# Patient Record
Sex: Female | Born: 1955 | Race: White | Hispanic: No | Marital: Married | Smoking: Never smoker
Health system: Southern US, Community
[De-identification: ages and names within clinical notes are randomized; demographics above are authoritative.]

## PROBLEM LIST (undated history)

## (undated) DIAGNOSIS — F0390 Unspecified dementia without behavioral disturbance: Secondary | ICD-10-CM

## (undated) DIAGNOSIS — I472 Ventricular tachycardia: Secondary | ICD-10-CM

## (undated) DIAGNOSIS — S8290XA Unspecified fracture of unspecified lower leg, initial encounter for closed fracture: Secondary | ICD-10-CM

## (undated) DIAGNOSIS — M1990 Unspecified osteoarthritis, unspecified site: Secondary | ICD-10-CM

## (undated) DIAGNOSIS — C801 Malignant (primary) neoplasm, unspecified: Secondary | ICD-10-CM

## (undated) DIAGNOSIS — S069X9A Unspecified intracranial injury with loss of consciousness of unspecified duration, initial encounter: Secondary | ICD-10-CM

## (undated) DIAGNOSIS — G40909 Epilepsy, unspecified, not intractable, without status epilepticus: Secondary | ICD-10-CM

## (undated) DIAGNOSIS — S069XAA Unspecified intracranial injury with loss of consciousness status unknown, initial encounter: Secondary | ICD-10-CM

## (undated) DIAGNOSIS — R569 Unspecified convulsions: Secondary | ICD-10-CM

## (undated) HISTORY — PX: FACIAL FRACTURE SURGERY: SHX1570

## (undated) HISTORY — PX: BREAST SURGERY: SHX581

---

## 1999-05-02 ENCOUNTER — Encounter: Payer: Self-pay | Admitting: Family Medicine

## 1999-05-02 ENCOUNTER — Ambulatory Visit (HOSPITAL_COMMUNITY): Admission: RE | Admit: 1999-05-02 | Discharge: 1999-05-02 | Payer: Self-pay | Admitting: Family Medicine

## 1999-12-11 ENCOUNTER — Ambulatory Visit (HOSPITAL_COMMUNITY): Admission: RE | Admit: 1999-12-11 | Discharge: 1999-12-11 | Payer: Self-pay | Admitting: Family Medicine

## 1999-12-11 ENCOUNTER — Encounter: Payer: Self-pay | Admitting: Family Medicine

## 2000-08-04 ENCOUNTER — Encounter: Admission: RE | Admit: 2000-08-04 | Discharge: 2000-08-04 | Payer: Self-pay | Admitting: Family Medicine

## 2000-08-04 ENCOUNTER — Encounter: Payer: Self-pay | Admitting: Family Medicine

## 2001-08-08 ENCOUNTER — Encounter: Payer: Self-pay | Admitting: Family Medicine

## 2001-08-08 ENCOUNTER — Ambulatory Visit (HOSPITAL_COMMUNITY): Admission: RE | Admit: 2001-08-08 | Discharge: 2001-08-08 | Payer: Self-pay | Admitting: Family Medicine

## 2002-11-15 ENCOUNTER — Ambulatory Visit (HOSPITAL_COMMUNITY): Admission: RE | Admit: 2002-11-15 | Discharge: 2002-11-15 | Payer: Self-pay | Admitting: Family Medicine

## 2002-11-15 ENCOUNTER — Encounter: Payer: Self-pay | Admitting: Family Medicine

## 2003-07-05 ENCOUNTER — Other Ambulatory Visit: Admission: RE | Admit: 2003-07-05 | Discharge: 2003-07-05 | Payer: Self-pay | Admitting: Family Medicine

## 2003-07-10 ENCOUNTER — Encounter: Payer: Self-pay | Admitting: Family Medicine

## 2003-07-10 ENCOUNTER — Encounter: Admission: RE | Admit: 2003-07-10 | Discharge: 2003-07-10 | Payer: Self-pay | Admitting: Family Medicine

## 2003-07-13 ENCOUNTER — Encounter: Payer: Self-pay | Admitting: Family Medicine

## 2003-07-13 ENCOUNTER — Encounter: Admission: RE | Admit: 2003-07-13 | Discharge: 2003-07-13 | Payer: Self-pay | Admitting: Family Medicine

## 2003-09-04 ENCOUNTER — Encounter (INDEPENDENT_AMBULATORY_CARE_PROVIDER_SITE_OTHER): Payer: Self-pay | Admitting: *Deleted

## 2003-09-04 ENCOUNTER — Encounter: Admission: RE | Admit: 2003-09-04 | Discharge: 2003-09-04 | Payer: Self-pay | Admitting: Family Medicine

## 2003-09-04 ENCOUNTER — Other Ambulatory Visit: Admission: RE | Admit: 2003-09-04 | Discharge: 2003-09-04 | Payer: Self-pay | Admitting: Family Medicine

## 2003-09-04 ENCOUNTER — Encounter: Payer: Self-pay | Admitting: Family Medicine

## 2004-06-10 ENCOUNTER — Emergency Department (HOSPITAL_COMMUNITY): Admission: EM | Admit: 2004-06-10 | Discharge: 2004-06-10 | Payer: Self-pay | Admitting: Emergency Medicine

## 2005-01-19 ENCOUNTER — Other Ambulatory Visit: Admission: RE | Admit: 2005-01-19 | Discharge: 2005-01-19 | Payer: Self-pay | Admitting: Family Medicine

## 2005-07-08 ENCOUNTER — Encounter (INDEPENDENT_AMBULATORY_CARE_PROVIDER_SITE_OTHER): Payer: Self-pay | Admitting: *Deleted

## 2005-07-08 ENCOUNTER — Encounter: Admission: RE | Admit: 2005-07-08 | Discharge: 2005-07-08 | Payer: Self-pay | Admitting: General Surgery

## 2005-08-24 ENCOUNTER — Ambulatory Visit (HOSPITAL_BASED_OUTPATIENT_CLINIC_OR_DEPARTMENT_OTHER): Admission: RE | Admit: 2005-08-24 | Discharge: 2005-08-24 | Payer: Self-pay | Admitting: General Surgery

## 2005-08-24 ENCOUNTER — Ambulatory Visit (HOSPITAL_COMMUNITY): Admission: RE | Admit: 2005-08-24 | Discharge: 2005-08-24 | Payer: Self-pay | Admitting: General Surgery

## 2005-08-24 ENCOUNTER — Encounter (INDEPENDENT_AMBULATORY_CARE_PROVIDER_SITE_OTHER): Payer: Self-pay | Admitting: *Deleted

## 2006-01-14 ENCOUNTER — Other Ambulatory Visit: Admission: RE | Admit: 2006-01-14 | Discharge: 2006-01-14 | Payer: Self-pay | Admitting: Family Medicine

## 2007-03-22 ENCOUNTER — Other Ambulatory Visit: Admission: RE | Admit: 2007-03-22 | Discharge: 2007-03-22 | Payer: Self-pay | Admitting: Family Medicine

## 2007-07-25 ENCOUNTER — Encounter: Admission: RE | Admit: 2007-07-25 | Discharge: 2007-07-25 | Payer: Self-pay | Admitting: Surgery

## 2007-09-08 ENCOUNTER — Encounter (INDEPENDENT_AMBULATORY_CARE_PROVIDER_SITE_OTHER): Payer: Self-pay | Admitting: Surgery

## 2007-09-08 ENCOUNTER — Ambulatory Visit (HOSPITAL_BASED_OUTPATIENT_CLINIC_OR_DEPARTMENT_OTHER): Admission: RE | Admit: 2007-09-08 | Discharge: 2007-09-08 | Payer: Self-pay | Admitting: Surgery

## 2007-12-21 ENCOUNTER — Encounter: Admission: RE | Admit: 2007-12-21 | Discharge: 2007-12-28 | Payer: Self-pay | Admitting: Family Medicine

## 2007-12-30 ENCOUNTER — Encounter: Admission: RE | Admit: 2007-12-30 | Discharge: 2008-02-29 | Payer: Self-pay | Admitting: Family Medicine

## 2008-03-23 ENCOUNTER — Other Ambulatory Visit: Admission: RE | Admit: 2008-03-23 | Discharge: 2008-03-23 | Payer: Self-pay | Admitting: Family Medicine

## 2008-07-24 ENCOUNTER — Encounter: Admission: RE | Admit: 2008-07-24 | Discharge: 2008-09-07 | Payer: Self-pay | Admitting: Family Medicine

## 2008-08-07 ENCOUNTER — Ambulatory Visit: Payer: Self-pay | Admitting: Oncology

## 2009-07-17 ENCOUNTER — Other Ambulatory Visit: Admission: RE | Admit: 2009-07-17 | Discharge: 2009-07-17 | Payer: Self-pay | Admitting: Obstetrics and Gynecology

## 2009-08-21 ENCOUNTER — Emergency Department (HOSPITAL_COMMUNITY): Admission: EM | Admit: 2009-08-21 | Discharge: 2009-08-21 | Payer: Self-pay | Admitting: Emergency Medicine

## 2010-07-04 ENCOUNTER — Other Ambulatory Visit: Admission: RE | Admit: 2010-07-04 | Discharge: 2010-07-04 | Payer: Self-pay | Admitting: Obstetrics and Gynecology

## 2011-04-04 LAB — POCT I-STAT, CHEM 8
HCT: 39 % (ref 36.0–46.0)
Hemoglobin: 13.3 g/dL (ref 12.0–15.0)
Sodium: 140 mEq/L (ref 135–145)
TCO2: 26 mmol/L (ref 0–100)

## 2011-05-12 NOTE — Op Note (Signed)
[name], RUDDER                 ACCOUNT NO.:  [id number]   MEDICAL RECORD NO.:  [id number]          PATIENT TYPE:  AMB   LOCATION:  DSC                          FACILITY:  [facility]   PHYSICIAN:  Thomas A. Cornett, M.D.DATE OF BIRTH:  [birth year]   DATE OF PROCEDURE:  09/08/2007  DATE OF DISCHARGE:                               OPERATIVE REPORT   PREOPERATIVE DIAGNOSIS:  Right breast mass.   POSTOPERATIVE DIAGNOSIS:  Right breast mass.   PROCEDURE:  Open right breast excisional biopsy.   SURGEON:  Harriette Bouillon, M.D.   ANESTHESIA:  LMA with 0.25% Sensorcaine local.   ESTIMATED BLOOD LOSS:  20 mL.   SPECIMEN:  Large solid cystic mass to pathology.   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 Francina Ames in the past.  This  area has recurred near her right nipple and she wished to have it  excised.  It is a complex solid cystic mass but she does not wish to  have this biopsied  percutaneously and would like to have it removed as  it is getting larger and is quite concerning to her.  She presents today  for that.   DESCRIPTION OF PROCEDURE:  The patient was brought to the operating  room, placed supine.  After induction of LMA anesthesia, right breast  was prepped, draped in sterile fashion.  0.25% Sensorcaine was used.  This was infiltrated into the skin.  I used a previous incision that was  present at the junction of the areola and breast skin from previous  lumpectomy which was a fibroadenoma.  We used the same incision and  opened the area and excised a large 5 x 4 cm solid cystic mass which had  the appearance of a combination of a cyst as well as a fibroadenoma.  These were passed off the field.  She had very little breast tissue left  from previous biopsies, but we were able to close this with minimal  cosmetic defect using a 4-0 Monocryl.  Dermabond was used as our  dressing.  All final counts of  sponge, needle and instruments were found  to be correct this portion case.  Hemostasis was excellent prior to  closing.  She was awoke taken to recovery in satisfactory condition.      Thomas A. Cornett, M.D.  Electronically Signed    TAC/MEDQ  D:  09/08/2007  T:  09/09/2007  Job:  [id number]   cc:   Chales Salmon. Abigail Miyamoto, M.D.  Lum Keas, MD

## 2011-05-21 ENCOUNTER — Other Ambulatory Visit: Payer: Self-pay | Admitting: Gastroenterology

## 2011-05-21 ENCOUNTER — Other Ambulatory Visit: Payer: Self-pay | Admitting: Family Medicine

## 2011-05-21 DIAGNOSIS — Z1231 Encounter for screening mammogram for malignant neoplasm of breast: Secondary | ICD-10-CM

## 2011-05-22 ENCOUNTER — Ambulatory Visit
Admission: RE | Admit: 2011-05-22 | Discharge: 2011-05-22 | Disposition: A | Discharge status: Home / Self Care | Payer: BC Managed Care – PPO | Source: Ambulatory Visit | Attending: Gastroenterology | Admitting: Gastroenterology

## 2011-06-04 ENCOUNTER — Other Ambulatory Visit: Payer: Self-pay | Admitting: Gastroenterology

## 2011-06-05 ENCOUNTER — Other Ambulatory Visit: Payer: Self-pay | Admitting: Family Medicine

## 2011-07-08 ENCOUNTER — Other Ambulatory Visit (HOSPITAL_COMMUNITY)
Admission: RE | Admit: 2011-07-08 | Discharge: 2011-07-08 | Disposition: A | Discharge status: Home / Self Care | Payer: BC Managed Care – PPO | Source: Ambulatory Visit | Attending: Obstetrics and Gynecology | Admitting: Obstetrics and Gynecology

## 2011-07-08 ENCOUNTER — Other Ambulatory Visit: Payer: Self-pay | Admitting: Obstetrics and Gynecology

## 2011-07-08 DIAGNOSIS — Z01419 Encounter for gynecological examination (general) (routine) without abnormal findings: Secondary | ICD-10-CM | POA: Insufficient documentation

## 2011-07-27 ENCOUNTER — Other Ambulatory Visit (HOSPITAL_COMMUNITY): Payer: Self-pay | Admitting: Gastroenterology

## 2011-07-31 ENCOUNTER — Ambulatory Visit (HOSPITAL_COMMUNITY)
Admission: RE | Admit: 2011-07-31 | Discharge: 2011-07-31 | Disposition: A | Discharge status: Home / Self Care | Payer: BC Managed Care – PPO | Source: Ambulatory Visit | Attending: Gastroenterology | Admitting: Gastroenterology

## 2011-07-31 DIAGNOSIS — R1310 Dysphagia, unspecified: Secondary | ICD-10-CM | POA: Insufficient documentation

## 2011-10-09 LAB — CBC
HCT: 33.9 — ABNORMAL LOW
Hemoglobin: 11.6 — ABNORMAL LOW
MCHC: 34.2
MCV: 91
RBC: 3.72 — ABNORMAL LOW
RDW: 13.4

## 2011-10-09 LAB — DIFFERENTIAL
Basophils Absolute: 0
Basophils Relative: 0
Eosinophils Absolute: 0.1
Eosinophils Relative: 1
Monocytes Absolute: 0.4
Monocytes Relative: 9
Neutro Abs: 2.8

## 2011-10-09 LAB — BASIC METABOLIC PANEL
CO2: 30
Calcium: 9
Chloride: 106
GFR calc Af Amer: >60
Glucose, Bld: 94
Sodium: 140

## 2012-04-16 ENCOUNTER — Encounter (HOSPITAL_COMMUNITY): Payer: Self-pay | Admitting: Emergency Medicine

## 2012-04-16 ENCOUNTER — Emergency Department (HOSPITAL_COMMUNITY): Payer: BC Managed Care – PPO

## 2012-04-16 ENCOUNTER — Emergency Department (HOSPITAL_COMMUNITY)
Admission: EM | Admit: 2012-04-16 | Discharge: 2012-04-16 | Disposition: A | Discharge status: Home / Self Care | Payer: BC Managed Care – PPO | Attending: Emergency Medicine | Admitting: Emergency Medicine

## 2012-04-16 DIAGNOSIS — M129 Arthropathy, unspecified: Secondary | ICD-10-CM | POA: Insufficient documentation

## 2012-04-16 DIAGNOSIS — S0180XA Unspecified open wound of other part of head, initial encounter: Secondary | ICD-10-CM | POA: Insufficient documentation

## 2012-04-16 DIAGNOSIS — I493 Ventricular premature depolarization: Secondary | ICD-10-CM

## 2012-04-16 DIAGNOSIS — Z79899 Other long term (current) drug therapy: Secondary | ICD-10-CM | POA: Insufficient documentation

## 2012-04-16 DIAGNOSIS — I4949 Other premature depolarization: Secondary | ICD-10-CM | POA: Insufficient documentation

## 2012-04-16 DIAGNOSIS — Z7982 Long term (current) use of aspirin: Secondary | ICD-10-CM | POA: Insufficient documentation

## 2012-04-16 DIAGNOSIS — G40909 Epilepsy, unspecified, not intractable, without status epilepticus: Secondary | ICD-10-CM

## 2012-04-16 HISTORY — DX: Unspecified osteoarthritis, unspecified site: M19.90

## 2012-04-16 HISTORY — DX: Unspecified convulsions: R56.9

## 2012-04-16 HISTORY — DX: Malignant (primary) neoplasm, unspecified: C80.1

## 2012-04-16 HISTORY — DX: Unspecified intracranial injury with loss of consciousness of unspecified duration, initial encounter: S06.9X9A

## 2012-04-16 HISTORY — DX: Epilepsy, unspecified, not intractable, without status epilepticus: G40.909

## 2012-04-16 HISTORY — DX: Unspecified intracranial injury with loss of consciousness status unknown, initial encounter: S06.9XAA

## 2012-04-16 LAB — BASIC METABOLIC PANEL
CO2: 30 mEq/L (ref 19–32)
Chloride: 103 mEq/L (ref 96–112)
Sodium: 139 mEq/L (ref 135–145)

## 2012-04-16 LAB — TROPONIN I: Troponin I: <0.3 ng/mL (ref <0.30)

## 2012-04-16 LAB — VALPROIC ACID LEVEL: Valproic Acid Lvl: 57 ug/mL (ref 50.0–100.0)
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
## 2012-04-16 NOTE — Discharge Instructions (Signed)
As we discussed, your lab level of your seizure medication was in a good range. You cannot drive for 6 months after having a seizure. Your electrolytes were normal. You were found to have multiple premature heartbeats today, please discuss this with your regular doctor.     Epilepsy A seizure (convulsion) is a sudden change in brain function that causes a change in behavior, muscle activity, or ability to remain awake and alert. If a person has recurring seizures, this is called epilepsy. CAUSES  Epilepsy is a disorder with many possible causes. Anything that disturbs the normal pattern of brain cell activity can lead to seizures. Seizure can be caused from illness to brain damage to abnormal brain development. Epilepsy may develop because of:  An abnormality in brain wiring.   An imbalance of nerve signaling chemicals (neurotransmitters).   Some combination of these factors.  Scientists are learning an increasing amount about genetic causes of seizures. SYMPTOMS  The symptoms of a seizure can vary greatly from one person to another. These may include:  An aura, or warning that tells a person they are about to have a seizure.   Abnormal sensations, such as abnormal smell or seeing flashing lights.   Sudden, general body stiffness.   Rhythmic jerking of the face, arm, or leg - on one or both sides.   Sudden change in consciousness.   The person may appear to be awake but not responding.   They may appear to be asleep but cannot be awakened.   Grimacing, chewing, lip smacking, or drooling.   Often there is a period of sleepiness after a seizure.  DIAGNOSIS  The description you give to your caregiver about what you experienced will help them understand your problems. Equally important is the description by any witnesses to your seizure. A physical exam, including a detailed neurological exam, is necessary. An EEG (electroencephalogram) is a painless test of your brain waves. In this  test a diagram is created of your brain waves. These diagrams can be interpreted by a specialist. Pictures of your brain are usually taken with:  An MRI.   A CT scan.  Lab tests may be done to look for:  Signs of infection.   Abnormal blood chemistry.  PREVENTION  There is no way to prevent the development of epilepsy. If you have seizures that are typically triggered by an event (such as flashing lights), try to avoid the trigger. This can help you avoid a seizure.  PROGNOSIS  Most people with epilepsy lead outwardly normal lives. While epilepsy cannot currently be cured, for some people it does eventually go away. Most seizures do not cause brain damage. It is not uncommon for people with epilepsy, especially children, to develop behavioral and emotional problems. These problems are sometimes the consequence of medicine for seizures or social stress. For some people with epilepsy, the risk of seizures restricts their independence and recreational activities. For example, some states refuse drivers licenses to people with epilepsy. Most women with epilepsy can become pregnant. They should discuss their epilepsy and the medicine they are taking with their caregivers. Women with epilepsy have a 90 percent or better chance of having a normal, healthy baby. RISKS AND COMPLICATIONS  People with epilepsy are at increased risk of falls, accidents, and injuries. People with epilepsy are at special risk for two life-threatening conditions. These are status epilepticus and sudden unexplained death (extremely rare). Status epilepticus is a long lasting, continuous seizure that is a medical emergency. TREATMENT  Once epilepsy is diagnosed, it is important to begin treatment as soon as possible. For about 80 percent of those diagnosed with epilepsy, seizures can be controlled with modern medicines and surgical techniques. Some antiepileptic drugs can interfere with the effectiveness of oral contraceptives. In  1997, the FDA approved a pacemaker for the brain the (vagus nerve stimulator). This stimulator can be used for people with seizures that are not well-controlled by medicine. Studies have shown that in some cases, children may experience fewer seizures if they maintain a strict diet. The strict diet is called the ketogenic diet. This diet is rich in fats and low in carbohydrates. HOME CARE INSTRUCTIONS   Your caregiver will make recommendations about driving and safety in normal activities. Follow these carefully.   Take any medicine prescribed exactly as directed.   Do any blood tests requested to monitor the levels of your medicine.   The people you live and work with should know that you are prone to seizures. They should receive instructions on how to help you. In general, a witness to a seizure should:   Cushion your head and body.   Turn you on your side.   Avoid unnecessarily restraining you.   Not place anything inside your mouth.   Call for local emergency medical help if there is any question about what has occurred.   Keep a seizure diary. Record what you recall about any seizure, especially any possible trigger.   If your caregiver has given you a follow-up appointment, it is very important to keep that appointment. Not keeping the appointment could result in permanent injury and disability. If there is any problem keeping the appointment, you must call back to this facility for assistance.  SEEK MEDICAL CARE IF:   You develop signs of infection or other illness. This might increase the risk of a seizure.   You seem to be having more frequent seizures.   Your seizure pattern is changing.  SEEK IMMEDIATE MEDICAL CARE IF:   A seizure does not stop after a few moments.   A seizure causes any difficulty in breathing.   A seizure results in a very severe headache.   A seizure leaves you with the inability to speak or use a part of your body.  MAKE SURE YOU:    Understand these instructions.   Will watch your condition.   Will get help right away if you are not doing well or get worse.  Document Released: 12/14/2005 Document Revised: 12/03/2011 Document Reviewed: 07/20/2008 [facility] Patient Information 2012 [place], [place].         Driving and Equipment Restrictions Some medical problems make it dangerous to drive, ride a bike, or use machines. Some of these problems are:  A hard blow to the head (concussion).   Passing out (fainting).   Twitching and shaking (seizures).   Low blood sugar.   Taking medicine to help you relax (sedatives).   Taking pain medicines.   Wearing an eye patch.   Wearing splints. This can make it hard to use parts of your body that you need to drive safely.  HOME CARE   Do not drive until your doctor says it is okay.   Do not use machines until your doctor says it is okay.  You may need a form signed by your doctor (medical release) before you can drive again. You may also need this form before you do other tasks where you need to be fully alert. MAKE SURE YOU:  Understand these instructions.   Will watch your condition.   Will get help right away if you are not doing well or get worse.  Document Released: 01/21/2005 Document Revised: 12/03/2011 Document Reviewed: 04/23/2010 [facility] Patient Information 2012 [place], [place].         Premature Ventricular Contraction Premature ventricular contraction (PVC) is an irregularity of the heart rhythm involving extra or skipped heartbeats. In some cases, they may occur without obvious cause or heart disease. Other times, they can be caused by an electrolyte change in the blood. These need to be corrected. They can also be seen when there is not enough oxygen going to the heart. A common cause of this is plaque or cholesterol buildup. This buildup decreases the blood supply to the heart. In addition, extra beats may be caused or aggravated  by:  Excessive smoking.   Alcohol consumption.   Caffeine.   Certain medications   Some street drugs.  SYMPTOMS   The sensation of feeling your heart skipping a beat (palpitations).   In many cases, the person may have no symptoms.  SIGNS AND TESTS   A physical examination may show an occasional irregularity, but if the PVC beats do not happen often, they may not be found on physical exam.   Blood pressure is usually normal.   Other tests that may find extra beats of the heart are:   An EKG (electrocardiogram)   A Holter monitor which can monitor your heart over longer periods of time   An Angiogram (study of the heart arteries).  TREATMENT  Usually extra heartbeats do not need treatment. The condition is treated only if symptoms are severe or if extra beats are very frequent or are causing problems. An underlying cause, if discovered, may also require treatment.  Treatment may also be needed if there may be a risk for other more serious cardiac arrhythmias.  PREVENTION   Moderation in caffeine, alcohol, and tobacco use may reduce the risk of ectopic heartbeats in some people.   Exercise often helps people who lead a sedentary (inactive) lifestyle.  PROGNOSIS  PVC heartbeats are generally harmless and do not need treatment.  RISKS AND COMPLICATIONS   Ventricular tachycardia (occasionally).   There usually are no complications.   Other arrhythmias (occasionally).  SEEK IMMEDIATE MEDICAL CARE IF:   You feel palpitations that are frequent or continual.   You develop chest pain or other problems such as shortness of breath, sweating, or nausea and vomiting.   You become light-headed or faint (pass out).   You get worse or do not improve with treatment.  Document Released: 07/31/2004 Document Revised: 12/03/2011 Document Reviewed: 02/10/2008 [facility] Patient Information 2012 [place], [place].
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
## 2012-04-16 NOTE — ED Notes (Signed)
Per Pt: recent changes to medication regimen have led to increased "symptoms" during day time and "seizures at night". Pt was aware of driving restrictions but felt safe driving during day. Education provided to patient and husband.

## 2012-04-16 NOTE — ED Notes (Signed)
Pt has seizure precautions in place at this time. She is on the cardiac monitor. Vital signs are WDL. Pt has two small lacerations to the forehead. Pt denies any pain at this time.
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
## 2012-04-18 NOTE — ED Provider Notes (Signed)
Medical screening examination/treatment/procedure(s) were performed by non-physician practitioner and as supervising physician I was immediately available for consultation/collaboration.   Laray Anger, DO 04/18/12 [phone]

## 2012-07-12 ENCOUNTER — Other Ambulatory Visit (HOSPITAL_COMMUNITY)
Admission: RE | Admit: 2012-07-12 | Discharge: 2012-07-12 | Disposition: A | Discharge status: Home / Self Care | Payer: BC Managed Care – PPO | Source: Ambulatory Visit | Attending: Obstetrics and Gynecology | Admitting: Obstetrics and Gynecology

## 2012-07-12 ENCOUNTER — Other Ambulatory Visit: Payer: Self-pay | Admitting: Obstetrics and Gynecology

## 2012-07-12 DIAGNOSIS — Z1151 Encounter for screening for human papillomavirus (HPV): Secondary | ICD-10-CM | POA: Insufficient documentation

## 2012-07-12 DIAGNOSIS — Z01419 Encounter for gynecological examination (general) (routine) without abnormal findings: Secondary | ICD-10-CM | POA: Insufficient documentation

## 2013-06-16 ENCOUNTER — Other Ambulatory Visit: Payer: Self-pay

## 2014-08-26 ENCOUNTER — Encounter (HOSPITAL_COMMUNITY): Payer: Self-pay | Admitting: Emergency Medicine

## 2014-08-26 ENCOUNTER — Emergency Department (HOSPITAL_COMMUNITY)
Admission: EM | Admit: 2014-08-26 | Discharge: 2014-08-26 | Disposition: A | Discharge status: Home / Self Care | Payer: BC Managed Care – PPO | Attending: Emergency Medicine | Admitting: Emergency Medicine

## 2014-08-26 ENCOUNTER — Emergency Department (HOSPITAL_COMMUNITY): Payer: BC Managed Care – PPO

## 2014-08-26 DIAGNOSIS — G5630 Lesion of radial nerve, unspecified upper limb: Secondary | ICD-10-CM | POA: Insufficient documentation

## 2014-08-26 DIAGNOSIS — M129 Arthropathy, unspecified: Secondary | ICD-10-CM | POA: Insufficient documentation

## 2014-08-26 DIAGNOSIS — Z79899 Other long term (current) drug therapy: Secondary | ICD-10-CM | POA: Insufficient documentation

## 2014-08-26 DIAGNOSIS — G5632 Lesion of radial nerve, left upper limb: Secondary | ICD-10-CM

## 2014-08-26 DIAGNOSIS — Z7982 Long term (current) use of aspirin: Secondary | ICD-10-CM | POA: Insufficient documentation

## 2014-08-26 DIAGNOSIS — R5381 Other malaise: Secondary | ICD-10-CM | POA: Diagnosis present

## 2014-08-26 DIAGNOSIS — G40909 Epilepsy, unspecified, not intractable, without status epilepticus: Secondary | ICD-10-CM | POA: Insufficient documentation

## 2014-08-26 DIAGNOSIS — R5383 Other fatigue: Secondary | ICD-10-CM | POA: Diagnosis present

## 2014-08-26 DIAGNOSIS — Z859 Personal history of malignant neoplasm, unspecified: Secondary | ICD-10-CM | POA: Insufficient documentation

## 2014-08-26 DIAGNOSIS — Z87820 Personal history of traumatic brain injury: Secondary | ICD-10-CM | POA: Diagnosis not present

## 2014-08-26 LAB — DIFFERENTIAL
BASOS PCT: 0 % (ref 0–1)
Basophils Absolute: 0 10*3/uL (ref 0.0–0.1)
EOS ABS: 0.1 10*3/uL (ref 0.0–0.7)
EOS PCT: 2 % (ref 0–5)
LYMPHS ABS: 1.6 10*3/uL (ref 0.7–4.0)
Lymphocytes Relative: 35 % (ref 12–46)
MONOS PCT: 10 % (ref 3–12)
Monocytes Absolute: 0.4 10*3/uL (ref 0.1–1.0)
Neutro Abs: 2.4 10*3/uL (ref 1.7–7.7)
Neutrophils Relative %: 53 % (ref 43–77)

## 2014-08-26 LAB — COMPREHENSIVE METABOLIC PANEL
ALBUMIN: 3.7 g/dL (ref 3.5–5.2)
ALT: 14 U/L (ref 0–35)
ANION GAP: 11 (ref 5–15)
AST: 23 U/L (ref 0–37)
Alkaline Phosphatase: 63 U/L (ref 39–117)
BUN: 17 mg/dL (ref 6–23)
CALCIUM: 9.1 mg/dL (ref 8.4–10.5)
CO2: 26 mEq/L (ref 19–32)
Chloride: 93 mEq/L — ABNORMAL LOW (ref 96–112)
Creatinine, Ser: 0.78 mg/dL (ref 0.50–1.10)
GFR calc non Af Amer: >90 mL/min (ref >90)
GLUCOSE: 80 mg/dL (ref 70–99)
Potassium: 4.5 mEq/L (ref 3.7–5.3)
SODIUM: 130 meq/L — AB (ref 137–147)
TOTAL PROTEIN: 6.5 g/dL (ref 6.0–8.3)
Total Bilirubin: <0.2 mg/dL — ABNORMAL LOW (ref 0.3–1.2)

## 2014-08-26 LAB — URINALYSIS, ROUTINE W REFLEX MICROSCOPIC
BILIRUBIN URINE: NEGATIVE
Glucose, UA: NEGATIVE mg/dL
HGB URINE DIPSTICK: NEGATIVE
Ketones, ur: NEGATIVE mg/dL
NITRITE: NEGATIVE
PROTEIN: NEGATIVE mg/dL
SPECIFIC GRAVITY, URINE: 1.013 (ref 1.005–1.030)
UROBILINOGEN UA: 0.2 mg/dL (ref 0.0–1.0)
pH: 5.5 (ref 5.0–8.0)

## 2014-08-26 LAB — URINE MICROSCOPIC-ADD ON

## 2014-08-26 LAB — CBC
HCT: 34.9 % — ABNORMAL LOW (ref 36.0–46.0)
Hemoglobin: 12.1 g/dL (ref 12.0–15.0)
MCH: 31.4 pg (ref 26.0–34.0)
MCHC: 34.7 g/dL (ref 30.0–36.0)
MCV: 90.6 fL (ref 78.0–100.0)
PLATELETS: 135 10*3/uL — AB (ref 150–400)
RBC: 3.85 MIL/uL — AB (ref 3.87–5.11)
RDW: 13 % (ref 11.5–15.5)
WBC: 4.5 10*3/uL (ref 4.0–10.5)

## 2014-08-26 LAB — VALPROIC ACID LEVEL: Valproic Acid Lvl: 65.2 ug/mL (ref 50.0–100.0)

## 2014-08-26 NOTE — ED Notes (Signed)
Patient transported to MRI 

## 2014-08-26 NOTE — ED Notes (Signed)
Pt returned from MRI °

## 2014-08-26 NOTE — ED Notes (Signed)
Pt reports that on Friday she had decreased range of motion of her fingers in the left hand that began on Friday. Pt has full mobility of the shoulder. Pt reports pain in the left elbow as well. Pt denies any recent falls or injury, however reports cleaning her home yesterday.

## 2014-08-26 NOTE — ED Notes (Signed)
MD at bedside. 

## 2014-08-26 NOTE — ED Provider Notes (Addendum)
Medical screening examination/treatment/procedure(s) were conducted as a shared visit with non-physician practitioner(s) and myself.  I personally evaluated the patient during the encounter.   EKG Interpretation None     Patient here complaining of a two-day history of decreased ability to flex her left hand. No other reported weakness or severe headaches. Does have a history of seizures and has had gait issues in the past. On exam patient has normal sensation in all 5 digits. Has decreased strength with extension of the left wrist. Radial pulses 2+ on the left. Median nerve appears to be intact. Ulnar nerve is intact as well 2. grip strength is decreased. Gait is normal without ataxia. Some dysmetria on the left side. No facial asymmetry. Cranial nerves intact 2 through 12. No speech abnormalities. We'll obtain MRI to rule out stroke. Patient likely with peripheral nerve issue  6:44 PM Patient's brain MRI negative. 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  Leota Jacobsen, MD 08/26/14 1545  Leota Jacobsen, MD 08/26/14 [phone]

## 2014-08-26 NOTE — Discharge Instructions (Signed)
Call the neurologist tomorrow to schedule a followup visit Radial Nerve Palsy Wrist drop is also known as radial nerve palsy. It is a condition in which you can not extend your wrist. This means if you are standing with your elbow bent at a right angle and with the top of your hand pointed at the ceiling, you can not hold your hand up. It falls toward the floor.  This action of extending your wrist is caused by the muscles in the back of your arm. These muscles are controlled by the radial nerve. This means that anything affecting the radial nerve so it can not tell the muscles how to work will cause wrist drop. This is medically called radial nerve palsy. Also the radial nerve is a motor and sensory nerve so anything affecting it causes problems with movement and feeling. CAUSES  Some more common causes of wrist drop are:  A break (fracture) of the large bone in the arm between your shoulder and your elbow (humerus). This is because the radial nerve winds around the humerus.  Improper use of crutches causes this because the radial nerve runs through the armpit (axilla). Crutches which are too long can put pressure on the nerve. This is sometimes called crutch palsy.  Falling asleep with your arm over a chair and supported on the back is a common cause. This is sometimes called Saturday Night Syndrome.  Wrist drop can be associated with lead poisoning because of the effect of lead on the radial nerve. SYMPTOMS  The wrist drop is an obvious problem, but there may also be numbness in the back of the arm, forearm or hand which provides feeling in these areas by the radial nerve. There can be difficulty straightening out the elbow in addition to the wrist. There may be numbness, tingling, pain, burning sensations or other abnormal feelings. Symptoms depend entirely on where the radial nerve is injured. DIAGNOSIS   Wrist drop is obvious just by looking at it. Your caregiver may make the diagnosis by  taking your history and doing a couple tests.  One test which may be done is a nerve conduction study. This test shows if the radial nerve is conducting signals well. If not, it can determine where the nerve problem is.  Sometimes X-ray studies are done. Your caregiver will determine if further testing needs to be done. TREATMENT   Usually if the problem is found to be pressure on the nerve, simply removing the pressure will allow the nerve to go back to normal in a few weeks to a few months. Other treatments will depend upon the cause found.  Only take over-the-counter or prescription medicines for pain, discomfort, or fever as directed by your caregiver.  Sometimes seizure medications are used.  Steroids are sometimes given to decrease swelling if it is thought to be a possible cause. Document Released: 08/20/2006 Document Revised: 03/07/2012 Document Reviewed: 02/28/2014 [facility] Patient Information 2015 [place], [place]. This information is not intended to replace advice given to you by your health care provider. Make sure you discuss any questions you have with your health care provider.

## 2014-08-26 NOTE — ED Provider Notes (Signed)
CSN: [id number]     Arrival date & time 08/26/14  1419 History  This chart was scribed for non-physician practitioner, Margarita Mail, PA-C,working with Orlie Dakin, MD, by Marlowe Kays, ED Scribe. This patient was seen in room WTR6/WTR6 and the patient's care was started at 2:50 PM.  Chief Complaint  Patient presents with  . Hand Problem   The history is provided by the patient. No language interpreter was used.   HPI Comments:  [name] is a 58 y.o. female with PMH of seizure disorder, brian injury and cancer who presents to the Emergency Department complaining of moderate decreased ROM of the left hand that began two days ago. She reports "falling asleep" at work and upon waking she states her left hand was paralyzed and numb. Pt states she does not remember falling asleep and does not remember what happened. She states she kept massaging the hand with minimal relief. Upon waking the next day she states the hand was extremely weak, but she could feel it and had some ability to move it. Pt reports some increased confusion as of late.   Past Medical History  Diagnosis Date  . Seizure disorder   . Brain injury   . Arthritis lower back/hands  . Seizures   . Cancer breast tumor removed   Past Surgical History  Procedure Laterality Date  . Breast surgery    . Facial fracture surgery  orbital bone repair   No family history on file. History  Substance Use Topics  . Smoking status: Never Smoker   . Smokeless tobacco: Never Used  . Alcohol Use: No   OB History   Grav Para Term Preterm Abortions TAB SAB Ect Mult Living                 Review of Systems  Allergies  Review of patient's allergies indicates no known allergies.  Home Medications   Prior to Admission medications   Medication Sig Start Date End Date Taking? Authorizing Provider  aspirin EC 325 MG tablet Take 325 mg by mouth every morning.   Yes Historical Provider, MD  cloBAZam (ONFI) 10 MG tablet Take 10  mg by mouth 2 (two) times daily.   Yes Historical Provider, MD  divalproex (DEPAKOTE) 500 MG DR tablet Take 1,000 mg by mouth at bedtime.    Yes Historical Provider, MD  estradiol (ESTRACE) 0.1 MG/GM vaginal cream Place 1 g vaginally as needed.    Yes Historical Provider, MD  Multiple Vitamin (MULITIVITAMIN WITH MINERALS) TABS Take 1 tablet by mouth every morning.    Yes Historical Provider, MD  naproxen sodium (ANAPROX) 220 MG tablet Take 220 mg by mouth every morning.   Yes Historical Provider, MD  omeprazole (PRILOSEC) 20 MG capsule Take 20 mg by mouth 2 (two) times daily.   Yes Historical Provider, MD   Triage Vitals: BP 118/65  Pulse 69  Temp(Src) 98.8 F (37.1 C) (Oral)  Resp 20  SpO2 100% Physical Exam  Nursing note and vitals reviewed. Constitutional: She is oriented to person, place, and time. She appears well-developed and well-nourished.  HENT:  Head: Normocephalic and atraumatic.  Eyes: EOM are normal.  Neck: Normal range of motion.  Cardiovascular: Normal rate.   Radial pulses equal bilaterally.  Pulmonary/Chest: Effort normal.  Musculoskeletal: Normal range of motion.  Weakness in left hand. Decreased tone in musculature of left hand.  Neurological: She is alert and oriented to person, place, and time.  Sensations intact bilaterally.  Skin:  Skin is warm and dry.  Psychiatric: She has a normal mood and affect. Her behavior is normal.    ED Course  Procedures (including critical care time) DIAGNOSTIC STUDIES: Oxygen Saturation is 100% on RA, normal by my interpretation.   COORDINATION OF CARE: 3:04 PM- Will move pt to back for further evaluation. Pt verbalizes understanding and agrees to plan.  Medications - No data to display  Labs Review Labs Reviewed - No data to display  Imaging Review No results found.   EKG Interpretation None      MDM   Final diagnoses:  None   This incident happened greater than 48 hours ago and pt is now outside of the TPA  window.  Patient with clear neurologic deficit, left hand weakness, decreased tone and strength, she ha normal reflex. History of irregular rhythm, brain injury and seizure. She has ataxia with tandem walking.  I personally performed the services described in this documentation, which was scribed in my presence. The recorded information has been reviewed and is accurate.  Margarita Mail, PA-C 08/30/14 [phone]

## 2014-08-30 NOTE — ED Provider Notes (Signed)
Medical screening examination/treatment/procedure(s) were performed by non-physician practitioner and as supervising physician I was immediately available for consultation/collaboration.   EKG Interpretation   Date/Time:  Sunday August 26 2014 15:15:31 EDT Ventricular Rate:  65 PR Interval:  176 QRS Duration: 105 QT Interval:  409 QTC Calculation: 425 R Axis:   64 Text Interpretation:  Sinus rhythm Ventricular premature complex Minimal  ST depression, anterolateral leads No significant change since last  tracing Confirmed by Zenia Resides  MD, Saanvi Hakala ([id number]) on 08/26/2014 3:42:56 PM       Leota Jacobsen, MD 08/30/14 1535

## 2015-06-19 ENCOUNTER — Other Ambulatory Visit: Payer: Self-pay | Admitting: Obstetrics and Gynecology

## 2015-06-19 ENCOUNTER — Other Ambulatory Visit (HOSPITAL_COMMUNITY)
Admission: RE | Admit: 2015-06-19 | Discharge: 2015-06-19 | Disposition: A | Discharge status: Home / Self Care | Payer: BLUE CROSS/BLUE SHIELD | Source: Ambulatory Visit | Attending: Obstetrics and Gynecology | Admitting: Obstetrics and Gynecology

## 2015-06-19 DIAGNOSIS — Z01419 Encounter for gynecological examination (general) (routine) without abnormal findings: Secondary | ICD-10-CM | POA: Diagnosis present

## 2015-06-19 DIAGNOSIS — Z1151 Encounter for screening for human papillomavirus (HPV): Secondary | ICD-10-CM | POA: Insufficient documentation

## 2015-06-21 ENCOUNTER — Encounter: Payer: Self-pay | Admitting: Genetic Counselor

## 2015-06-21 LAB — CYTOLOGY - PAP

## 2016-02-10 DIAGNOSIS — G40209 Localization-related (focal) (partial) symptomatic epilepsy and epileptic syndromes with complex partial seizures, not intractable, without status epilepticus: Secondary | ICD-10-CM | POA: Insufficient documentation

## 2016-06-12 ENCOUNTER — Encounter: Payer: Self-pay | Admitting: Genetic Counselor

## 2017-03-28 ENCOUNTER — Encounter (HOSPITAL_COMMUNITY): Payer: Self-pay

## 2017-03-28 ENCOUNTER — Emergency Department (HOSPITAL_COMMUNITY)
Admission: EM | Admit: 2017-03-28 | Discharge: 2017-03-28 | Disposition: A | Discharge status: Home / Self Care | Payer: BC Managed Care – PPO | Attending: Emergency Medicine | Admitting: Emergency Medicine

## 2017-03-28 ENCOUNTER — Emergency Department (HOSPITAL_COMMUNITY): Payer: BC Managed Care – PPO

## 2017-03-28 DIAGNOSIS — R0789 Other chest pain: Secondary | ICD-10-CM | POA: Insufficient documentation

## 2017-03-28 DIAGNOSIS — R55 Syncope and collapse: Secondary | ICD-10-CM | POA: Diagnosis not present

## 2017-03-28 DIAGNOSIS — Z7982 Long term (current) use of aspirin: Secondary | ICD-10-CM | POA: Insufficient documentation

## 2017-03-28 DIAGNOSIS — R938 Abnormal findings on diagnostic imaging of other specified body structures: Secondary | ICD-10-CM | POA: Insufficient documentation

## 2017-03-28 DIAGNOSIS — R9389 Abnormal findings on diagnostic imaging of other specified body structures: Secondary | ICD-10-CM

## 2017-03-28 DIAGNOSIS — D702 Other drug-induced agranulocytosis: Secondary | ICD-10-CM | POA: Diagnosis not present

## 2017-03-28 HISTORY — DX: Unspecified fracture of unspecified lower leg, initial encounter for closed fracture: S82.90XA

## 2017-03-28 LAB — URINALYSIS, ROUTINE W REFLEX MICROSCOPIC
Bilirubin Urine: NEGATIVE
GLUCOSE, UA: NEGATIVE mg/dL
HGB URINE DIPSTICK: NEGATIVE
KETONES UR: NEGATIVE mg/dL
Leukocytes, UA: NEGATIVE
NITRITE: NEGATIVE
PH: 6 (ref 5.0–8.0)
Protein, ur: NEGATIVE mg/dL
SPECIFIC GRAVITY, URINE: 1.009 (ref 1.005–1.030)

## 2017-03-28 LAB — CBC WITH DIFFERENTIAL/PLATELET
Basophils Absolute: 0 10*3/uL (ref 0.0–0.1)
Basophils Relative: 0 %
EOS ABS: 0 10*3/uL (ref 0.0–0.7)
Eosinophils Relative: 1 %
HCT: 34.5 % — ABNORMAL LOW (ref 36.0–46.0)
Hemoglobin: 12.1 g/dL (ref 12.0–15.0)
LYMPHS ABS: 0.9 10*3/uL (ref 0.7–4.0)
LYMPHS PCT: 33 %
MCH: 30.7 pg (ref 26.0–34.0)
MCHC: 35.1 g/dL (ref 30.0–36.0)
MCV: 87.6 fL (ref 78.0–100.0)
MONO ABS: 0.4 10*3/uL (ref 0.1–1.0)
MONOS PCT: 14 %
Neutro Abs: 1.5 10*3/uL — ABNORMAL LOW (ref 1.7–7.7)
Neutrophils Relative %: 52 %
PLATELETS: 134 10*3/uL — AB (ref 150–400)
RBC: 3.94 MIL/uL (ref 3.87–5.11)
RDW: 12.9 % (ref 11.5–15.5)
WBC: 2.8 10*3/uL — AB (ref 4.0–10.5)

## 2017-03-28 LAB — BASIC METABOLIC PANEL
Anion gap: 6 (ref 5–15)
BUN: 17 mg/dL (ref 6–20)
CALCIUM: 8.6 mg/dL — AB (ref 8.9–10.3)
CHLORIDE: 97 mmol/L — AB (ref 101–111)
CO2: 27 mmol/L (ref 22–32)
Creatinine, Ser: 0.83 mg/dL (ref 0.44–1.00)
GFR calc Af Amer: >60 mL/min (ref >60)
GFR calc non Af Amer: >60 mL/min (ref >60)
Glucose, Bld: 72 mg/dL (ref 65–99)
Potassium: 3.9 mmol/L (ref 3.5–5.1)
SODIUM: 130 mmol/L — AB (ref 135–145)

## 2017-03-28 LAB — CBG MONITORING, ED: Glucose-Capillary: 138 mg/dL — ABNORMAL HIGH (ref 65–99)

## 2017-03-28 LAB — I-STAT TROPONIN, ED
TROPONIN I, POC: 0.01 ng/mL (ref 0.00–0.08)
TROPONIN I, POC: 0 ng/mL (ref 0.00–0.08)

## 2017-03-28 MED ORDER — SODIUM CHLORIDE 0.9 % IV BOLUS (SEPSIS)
1000.0000 mL | Freq: Once | INTRAVENOUS | Status: AC
  Administered 2017-03-28: 1000 mL via INTRAVENOUS

## 2017-03-28 NOTE — ED Triage Notes (Signed)
Patient states she was at church in the choir and had a near syncopal episode and had chest tightness that lasted more than 5 minutes.

## 2017-03-28 NOTE — ED Provider Notes (Signed)
[facility] DEPT Provider Note   CSN: [id number] Arrival date & time: 03/28/17  1120     History   Chief Complaint Chief Complaint  Patient presents with  . Near Syncope    HPI [name] is a 61 y.o. female with a PMHx of brain injury, seizures, remote breast CA s/p surgical removal, and arthritis, who presents to the ED with complaints of near-syncopal event that occurred while singing in the church choir at around 9 AM approximately 4 hours prior to evaluation. Patient states that she did not eat very much before going to church, only had a cup of yogurt and some juice with 2 prunes, and was dressed in her choir robe standing in the church choir singing when she started to feel diaphoretic, lightheaded, and had some chest tightness. She states that there was a cardiology PA at church who checked her radial pulse and felt that her BP was slightly low, sat her down and gave her food and some water which significantly improved her symptoms. She states that she's had some intermittent chest tightness over the last several weeks, including during today's event. She describes the tightness as 4/10 intermittent currently resolved, nonradiating anterior chest tightness with no known aggravating factors and improved after eating and drinking. No other treatments tried prior to arrival. She also reports that she has a very mild frontal headache which feels very similar to her prior headaches, states she thinks it's from pollen; HA didn't start until after the near syncope event, did not precede it. She is physically active walking 3 miles per day and going to the gym regularly. She states that she has a history of bigeminy and had an exercise stress test many years ago, but does not see a cardiologist and hasn't had any recent cardiac evaluations.  She denies syncope, vision changes, claudication, orthopnea, coughing/congestion, fevers, chills, SOB, LE swelling, recent travel/surg/immobilization,  estrogen use, personal/family hx of DVT/PE, abd pain, N/V/D/C, melena, hematochezia, hematuria, dysuria, myalgias, arthralgias, numbness, tingling, focal weakness, or any other complaints at this time. Nonsmoker. +FHx of CHF in her mother, otherwise no known cardiac illness in her family, or herself.   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 Followed by her neurologist for this.   The history is provided by the patient and medical records. No language interpreter was used.  Near Syncope  This is a new problem. The current episode started 3 to 5 hours ago. The problem occurs rarely. The problem has been resolved. Associated symptoms include headaches (mild frontal, similar to prior). Pertinent negatives include no abdominal pain and no shortness of breath. The symptoms are aggravated by standing. The symptoms are relieved by drinking and rest. She has tried food, water and rest for the symptoms. The treatment provided significant relief.    Past Medical History:  Diagnosis Date  . Arthritis lower back/hands  . Brain injury ([place])   . Cancer [facility]) breast tumor removed  . Leg fracture   . Seizure disorder ([place])   . Seizures ([place])     There are no active problems to display for this patient.   Past Surgical History:  Procedure Laterality Date  . BREAST SURGERY    . FACIAL FRACTURE SURGERY  orbital bone repair    OB History    No data available       Home Medications    Prior to Admission medications   Medication Sig Start Date  End Date Taking? Authorizing Provider  aspirin EC 325 MG tablet Take 325 mg by mouth every morning.    Historical Provider, MD  cloBAZam (ONFI) 10 MG tablet Take 10 mg by mouth 2 (two) times daily.    Historical Provider, MD  divalproex (DEPAKOTE) 500 MG DR tablet Take 1,000 mg by mouth at bedtime.     Historical Provider, MD  estradiol (ESTRACE) 0.1 MG/GM vaginal cream Place 1 g vaginally as needed.     Historical  Provider, MD  Multiple Vitamin (MULITIVITAMIN WITH MINERALS) TABS Take 1 tablet by mouth every morning.     Historical Provider, MD  naproxen sodium (ANAPROX) 220 MG tablet Take 220 mg by mouth every morning.    Historical Provider, MD  omeprazole (PRILOSEC) 20 MG capsule Take 20 mg by mouth 2 (two) times daily.    Historical Provider, MD    Family History No family history on file.  Social History Social History  Substance Use Topics  . Smoking status: Never Smoker  . Smokeless tobacco: Never Used  . Alcohol use No     Allergies   Patient has no known allergies.   Review of Systems Review of Systems  Constitutional: Positive for diaphoresis. Negative for chills and fever.  HENT: Negative for congestion.   Eyes: Negative for visual disturbance.  Respiratory: Positive for chest tightness. Negative for cough and shortness of breath.   Cardiovascular: Positive for near-syncope. Negative for leg swelling.  Gastrointestinal: Negative for abdominal pain, blood in stool, constipation, diarrhea, nausea and vomiting.  Genitourinary: Negative for dysuria and hematuria.  Musculoskeletal: Negative for arthralgias and myalgias.  Skin: Negative for color change.  Allergic/Immunologic: Negative for immunocompromised state.  Neurological: Positive for light-headedness and headaches (mild frontal, similar to prior). Negative for syncope, weakness and numbness.  Psychiatric/Behavioral: Negative for confusion.   10 Systems reviewed and are negative for acute change except as noted in the HPI.   Physical Exam Updated Vital Signs BP 110/68 (BP Location: Left Arm)   Pulse 66   Temp 97.7 F (36.5 C) (Oral)   Resp 18   Ht 5' 4.5" (1.638 m)   Wt 54.4 kg   SpO2 99%   BMI 20.28 kg/m   Physical Exam  Constitutional: She is oriented to person, place, and time. Vital signs are normal. She appears well-developed and well-nourished.  Non-toxic appearance. No distress.  Afebrile, nontoxic, NAD    HENT:  Head: Normocephalic and atraumatic.  Mouth/Throat: Oropharynx is clear and moist and mucous membranes are normal.  Eyes: Conjunctivae and EOM are normal. Pupils are equal, round, and reactive to light. Right eye exhibits no discharge. Left eye exhibits no discharge.  PERRL, EOMI, no nystagmus  Neck: Normal range of motion. Neck supple. No spinous process tenderness and no muscular tenderness present. No neck rigidity. Normal range of motion present.  FROM intact without spinous process TTP, no bony stepoffs or deformities, no paraspinous muscle TTP or muscle spasms. No rigidity or meningeal signs. No bruising or swelling.   Cardiovascular: Normal rate, regular rhythm, normal heart sounds and intact distal pulses.  Exam reveals no gallop and no friction rub.   No murmur heard. RRR, nl s1/s2, no m/r/g, distal pulses intact, no pedal edema   Pulmonary/Chest: Effort normal and breath sounds normal. No respiratory distress. She has no decreased breath sounds. She has no wheezes. She has no rhonchi. She has no rales.  Abdominal: Soft. Normal appearance and bowel sounds are normal. She exhibits no distension. There  is no tenderness. There is no rigidity, no rebound, no guarding, no CVA tenderness, no tenderness at McBurney's point and negative Murphy's sign.  Musculoskeletal: Normal range of motion.  MAE x4 Strength and sensation grossly intact in all extremities Distal pulses intact Gait steady  Neurological: She is alert and oriented to person, place, and time. She has normal strength. No cranial nerve deficit or sensory deficit. Coordination and gait normal. GCS eye subscore is 4. GCS verbal subscore is 5. GCS motor subscore is 6.  CN 2-12 grossly intact A&O x4 GCS 15 Sensation and strength intact Gait nonataxic including with tandem walking Coordination with finger-to-nose WNL Neg pronator drift   Skin: Skin is warm, dry and intact. No rash noted.  Psychiatric: She has a normal mood  and affect.  Nursing note and vitals reviewed.    ED Treatments / Results  Labs (all labs ordered are listed, but only abnormal results are displayed) Labs Reviewed  BASIC METABOLIC PANEL - Abnormal; Notable for the following:       Result Value   Sodium 130 (*)    Chloride 97 (*)    Calcium 8.6 (*)    All other components within normal limits  URINALYSIS, ROUTINE W REFLEX MICROSCOPIC - Abnormal; Notable for the following:    Color, Urine STRAW (*)    All other components within normal limits  CBC WITH DIFFERENTIAL/PLATELET - Abnormal; Notable for the following:    WBC 2.8 (*)    HCT 34.5 (*)    Platelets 134 (*)    Neutro Abs 1.5 (*)    All other components within normal limits  CBG MONITORING, ED - Abnormal; Notable for the following:    Glucose-Capillary 138 (*)    All other components within normal limits  I-STAT TROPOININ, ED  I-STAT TROPOININ, ED    EKG  EKG Interpretation  Date/Time:  Sunday March 28 2017 12:05:26 EDT Ventricular Rate:  66 PR Interval:    QRS Duration: 100 QT Interval:  382 QTC Calculation: 401 R Axis:   62 Text Interpretation:  Sinus rhythm Borderline repolarization abnormality No significant change since last tracing Confirmed by YAO  MD, DAVID ([id number]) on 03/28/2017 1:12:17 PM       Radiology Dg Chest 2 View  Result Date: 03/28/2017 CLINICAL DATA:  Chest tightness.  Near syncopal episode. EXAM: CHEST  2 VIEW COMPARISON:  None. FINDINGS: The cardiomediastinal silhouette is within normal limits. The lungs are hyperinflated. Minimal nodular density is present in the left lung apex and right lower lung on the PA radiograph. No confluent airspace opacity, edema, pleural effusion, or pneumothorax is identified. There is mild S-shaped thoracolumbar scoliosis. IMPRESSION: 1. Hyperinflation without evidence of acute cardiopulmonary process. 2. Minimal left apical and right basilar nodular densities. Nonemergent chest CT is recommended for further  evaluation. Electronically Signed   By: Allen  Grady M.D.   On: 03/28/2017 13:14    Procedures Procedures (including critical care time)  Medications Ordered in ED Medications  sodium chloride 0.9 % bolus 1,000 mL (0 mLs Intravenous Stopped 03/28/17 1426)     Initial Impression / Assessment and Plan / ED Course  I have reviewed the triage vital signs and the nursing notes.  Pertinent labs & imaging results that were available during my care of the patient were reviewed by me and considered in my medical decision making (see chart for details).     60  y.o. female here after a near syncopal event. She was at church singing in  the choir, dressed in a choir robe and standing after not having eaten much breakfast, felt lightheaded and diaphoretic, had some chest tightness which has resolved. When she sat down, had some water and ate a meal she felt much better. There was a cardiology PA at church with her who reported that her palpable BP was low, but improved after the food/fluids. Had a mild headache afterwards, that she feels that this is similar to her prior headaches and is not very significant. She reports that she's had some intermittent chest tightness over the last several weeks. She is very active walking 3 miles per day and goes to the gym regularly. On exam, no focal neuro deficit, well-appearing, clear lungs, no pedal edema, no tachycardia or hypoxia. EKG without any ischemic changes. Will get labs, chest x-ray, and give fluids. Likely vasovagal syncope event related to poor oral intake prior to singing in church choir. Doubt need for head imaging. Pt declines wanting anything for pain. Will reassess shortly.  2:37 PM Pt feeling better after 1L bolus. No ongoing lightheadedness or symptoms. CBC w/diff with mild leukopenia (already known to pt, likely depakote related), and plt count slightly low but near priors. BMP with mildly low Na 130, at baseline. Trop negative at 4hr mark. U/A  unremarkable. CXR with some minimal L apical and R basilar nodules, recommending nonemergent chest CT; doubt need for emergent evaluation of this, will have her f/up with PCP for further evaluation of this finding. Will repeat trop at 3hrs after initial trop, which will be ~7hrs after symptoms occurred. Pt feeling improved at this time. Will continue to monitor and reassess shortly  4:56 PM Second trop negative. Pt continues to feel well, no ongoing lightheadedness. Overall symptoms likely vasovagal event from poor PO intake prior to standing in hot robe singing in choir. Advised adequate hydration, rest, and oral intake prior to activities like this. Advised pt to f/up with PCP in 5-7 days for recheck of symptoms and for ongoing eval of the CXR finding. I explained the diagnosis and have given explicit precautions to return to the ER including for any other new or worsening symptoms. The patient understands and accepts the medical plan as it's been dictated and I have answered their questions. Discharge instructions concerning home care and prescriptions have been given. The patient is STABLE and is discharged to home in good condition.    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

## 2017-03-28 NOTE — Discharge Instructions (Signed)
Your work up today has been reassuring. Your symptoms are likely related to the fact that you were standing in a hot robe singing in church after not eating/drinking enough for breakfast. Make sure to always have a full meal and adequately hydrate prior to activities such as this. Make sure to stay very well hydrated with plenty of water. Your chest xray showed some nodules in your lungs, you will need to follow up with your regular doctor for ongoing evaluation of this finding. Follow up with your regular doctor in 5-7 days for recheck of your symptoms and ongoing evaluation. Return to the ER for emergent changes or worsening symptoms.

## 2017-03-28 NOTE — ED Notes (Signed)
Patient transported to X-ray 

## 2017-04-15 ENCOUNTER — Ambulatory Visit (INDEPENDENT_AMBULATORY_CARE_PROVIDER_SITE_OTHER): Payer: BC Managed Care – PPO | Admitting: Cardiovascular Disease

## 2017-04-15 ENCOUNTER — Telehealth: Payer: Self-pay

## 2017-04-15 ENCOUNTER — Other Ambulatory Visit: Payer: Self-pay | Admitting: Family Medicine

## 2017-04-15 ENCOUNTER — Encounter: Payer: Self-pay | Admitting: Cardiovascular Disease

## 2017-04-15 VITALS — BP 110/66 | HR 67 | Ht 64.5 in | Wt 132.4 lb

## 2017-04-15 DIAGNOSIS — R0789 Other chest pain: Secondary | ICD-10-CM | POA: Diagnosis not present

## 2017-04-15 DIAGNOSIS — R918 Other nonspecific abnormal finding of lung field: Secondary | ICD-10-CM

## 2017-04-15 HISTORY — DX: Other chest pain: R07.89

## 2017-04-15 NOTE — Telephone Encounter (Signed)
SENT NOTES TO SCHEDULING 

## 2017-04-15 NOTE — Patient Instructions (Signed)
Medication Instructions:  Your physician recommends that you continue on your current medications as directed. Please refer to the Current Medication list given to you today.  Labwork: NONE  Testing/Procedures: Your physician has requested that you have an exercise tolerance test. For further information please visit [email or web address]. Please also follow instruction sheet, as given.   Follow-Up: Your physician recommends that you schedule a follow-up appointment: AS NEEDED with Dr. Gwenlyn Found.   Any Other Special Instructions Will Be Listed Below (If Applicable).     If you need a refill on your cardiac medications before your next appointment, please call your pharmacy.

## 2017-04-15 NOTE — Progress Notes (Signed)
04/15/2017 ZENOLA DEZARN   01-29-1956  [id number]  Primary Physician Aretta Nip, MD Primary Cardiologist: Lorretta Harp MD Renae Gloss  HPI:  Ms Hanser is a 61 year old thin-appearing married Caucasian female mother of 2 children who currently is disabled because of seizure disorder. She was a Electrical engineer. She was referred by Dr. Zadie Rhine for cardiovascular evaluation because of infrequent episodes of exertional substernal chest pain resolved spontaneously. She has no cardiac risk factors. She denies dyspnea. She did have a episode of near-syncope 03/28/17 she was singing in the choir. This was thought to be vasovagal.   Current Outpatient Prescriptions  Medication Sig Dispense Refill  . aspirin EC 325 MG tablet Take 325 mg by mouth every morning.    . calcium-vitamin D (OSCAL WITH D) 250-125 MG-UNIT tablet Take by mouth.    . cloBAZam (ONFI) 10 MG tablet Take 10 mg by mouth 2 (two) times daily.    . divalproex (DEPAKOTE ER) 500 MG 24 hr tablet Take 1,000 mg by mouth daily.    . Multiple Vitamin (MULITIVITAMIN WITH MINERALS) TABS Take 1 tablet by mouth every morning.     . naproxen sodium (ANAPROX) 220 MG tablet Take 220 mg by mouth every morning.    Marland Kitchen omeprazole (PRILOSEC) 20 MG capsule Take 20 mg by mouth 2 (two) times daily.    . polyethylene glycol (MIRALAX / GLYCOLAX) packet Take 17 g by mouth daily.     No current facility-administered medications for this visit.     No Known Allergies  Social History   Social History  . Marital status: Married    Spouse name: N/A  . Number of children: N/A  . Years of education: N/A   Occupational History  . Not on file.   Social History Main Topics  . Smoking status: Never Smoker  . Smokeless tobacco: Never Used  . Alcohol use No  . Drug use: No  . Sexual activity: Not on file   Other Topics Concern  . Not on file   Social History Narrative  . No narrative on file     Review of  Systems: General: negative for chills, fever, night sweats or weight changes.  Cardiovascular: negative for chest pain, dyspnea on exertion, edema, orthopnea, palpitations, paroxysmal nocturnal dyspnea or shortness of breath Dermatological: negative for rash Respiratory: negative for cough or wheezing Urologic: negative for hematuria Abdominal: negative for nausea, vomiting, diarrhea, bright red blood per rectum, melena, or hematemesis Neurologic: negative for visual changes, syncope, or dizziness All other systems reviewed and are otherwise negative except as noted above.    Blood pressure 110/66, pulse 67, height 5' 4.5" (1.638 m), weight 132 lb 6.4 oz (60.1 kg).  General appearance: alert and no distress Neck: no adenopathy, no carotid bruit, no JVD, supple, symmetrical, trachea midline and thyroid not enlarged, symmetric, no tenderness/mass/nodules Lungs: clear to auscultation bilaterally Heart: regular rate and rhythm, S1, S2 normal, no murmur, click, rub or gallop Extremities: extremities normal, atraumatic, no cyanosis or edema  EKG sinus rhythm at 67 without ST or T-wave changes. I personally reviewed this EKG  ASSESSMENT AND PLAN:   Atypical chest pain Ms. Whitehorn was referred by Dr. Zadie Rhine for atypical chest pain. This occurs every several months. Last for 5 minutes and this appears on zone. There are no other associated symptoms. She has no risk factors. I'm going to get a routine GXT to further evaluate.      Pearletha Forge.  Gwenlyn Found MD [facility], [facility] 04/15/2017 4:17 PM

## 2017-04-15 NOTE — Assessment & Plan Note (Signed)
Ms. Grandinetti was referred by Dr. Zadie Rhine for atypical chest pain. This occurs every several months. Last for 5 minutes and this appears on zone. There are no other associated symptoms. She has no risk factors. I'm going to get a routine GXT to further evaluate.

## 2017-04-27 ENCOUNTER — Telehealth (HOSPITAL_COMMUNITY): Payer: Self-pay

## 2017-04-27 NOTE — Telephone Encounter (Signed)
Encounter complete. 

## 2017-04-28 ENCOUNTER — Telehealth (HOSPITAL_COMMUNITY): Payer: Self-pay

## 2017-04-28 NOTE — Telephone Encounter (Signed)
Encounter complete. 

## 2017-04-29 ENCOUNTER — Ambulatory Visit (INDEPENDENT_AMBULATORY_CARE_PROVIDER_SITE_OTHER): Payer: BC Managed Care – PPO | Admitting: Cardiology

## 2017-04-29 ENCOUNTER — Encounter (HOSPITAL_COMMUNITY): Payer: Self-pay | Admitting: General Practice

## 2017-04-29 ENCOUNTER — Encounter: Payer: Self-pay | Admitting: Cardiology

## 2017-04-29 ENCOUNTER — Ambulatory Visit (HOSPITAL_BASED_OUTPATIENT_CLINIC_OR_DEPARTMENT_OTHER)
Admission: RE | Admit: 2017-04-29 | Discharge: 2017-04-29 | Disposition: A | Discharge status: Home / Self Care | Payer: BC Managed Care – PPO | Source: Ambulatory Visit | Attending: Cardiology | Admitting: Cardiology

## 2017-04-29 ENCOUNTER — Inpatient Hospital Stay (HOSPITAL_COMMUNITY)
Admission: AD | Admit: 2017-04-29 | Discharge: 2017-05-03 | DRG: 287 | Disposition: A | Discharge status: Home / Self Care | Payer: BC Managed Care – PPO | Source: Ambulatory Visit | Attending: Cardiology | Admitting: Cardiology

## 2017-04-29 VITALS — BP 100/68 | HR 70 | Ht 64.5 in | Wt 133.0 lb

## 2017-04-29 DIAGNOSIS — I472 Ventricular tachycardia, unspecified: Secondary | ICD-10-CM

## 2017-04-29 DIAGNOSIS — G40909 Epilepsy, unspecified, not intractable, without status epilepticus: Secondary | ICD-10-CM | POA: Diagnosis present

## 2017-04-29 DIAGNOSIS — Z8249 Family history of ischemic heart disease and other diseases of the circulatory system: Secondary | ICD-10-CM

## 2017-04-29 DIAGNOSIS — R9439 Abnormal result of other cardiovascular function study: Secondary | ICD-10-CM

## 2017-04-29 DIAGNOSIS — Z791 Long term (current) use of non-steroidal anti-inflammatories (NSAID): Secondary | ICD-10-CM

## 2017-04-29 DIAGNOSIS — Z7982 Long term (current) use of aspirin: Secondary | ICD-10-CM

## 2017-04-29 DIAGNOSIS — R001 Bradycardia, unspecified: Secondary | ICD-10-CM | POA: Diagnosis present

## 2017-04-29 DIAGNOSIS — Z79899 Other long term (current) drug therapy: Secondary | ICD-10-CM

## 2017-04-29 DIAGNOSIS — Z853 Personal history of malignant neoplasm of breast: Secondary | ICD-10-CM | POA: Diagnosis not present

## 2017-04-29 DIAGNOSIS — R0789 Other chest pain: Secondary | ICD-10-CM | POA: Diagnosis not present

## 2017-04-29 HISTORY — DX: Ventricular tachycardia: I47.2

## 2017-04-29 HISTORY — DX: Ventricular tachycardia, unspecified: I47.20

## 2017-04-29 LAB — COMPREHENSIVE METABOLIC PANEL
ALT: 16 U/L (ref 14–54)
ANION GAP: 7 (ref 5–15)
AST: 24 U/L (ref 15–41)
Albumin: 3.5 g/dL (ref 3.5–5.0)
Alkaline Phosphatase: 57 U/L (ref 38–126)
BILIRUBIN TOTAL: 0.2 mg/dL — AB (ref 0.3–1.2)
BUN: 11 mg/dL (ref 6–20)
CHLORIDE: 92 mmol/L — AB (ref 101–111)
CO2: 29 mmol/L (ref 22–32)
Calcium: 8.5 mg/dL — ABNORMAL LOW (ref 8.9–10.3)
Creatinine, Ser: 0.72 mg/dL (ref 0.44–1.00)
Glucose, Bld: 128 mg/dL — ABNORMAL HIGH (ref 65–99)
POTASSIUM: 3.5 mmol/L (ref 3.5–5.1)
Sodium: 128 mmol/L — ABNORMAL LOW (ref 135–145)
TOTAL PROTEIN: 6 g/dL — AB (ref 6.5–8.1)

## 2017-04-29 LAB — CBC WITH DIFFERENTIAL/PLATELET
BASOS ABS: 0 10*3/uL (ref 0.0–0.1)
Basophils Relative: 0 %
Eosinophils Absolute: 0.1 10*3/uL (ref 0.0–0.7)
Eosinophils Relative: 2 %
HEMATOCRIT: 35.1 % — AB (ref 36.0–46.0)
HEMOGLOBIN: 12.3 g/dL (ref 12.0–15.0)
LYMPHS ABS: 1.4 10*3/uL (ref 0.7–4.0)
LYMPHS PCT: 41 %
MCH: 31.7 pg (ref 26.0–34.0)
MCHC: 35 g/dL (ref 30.0–36.0)
MCV: 90.5 fL (ref 78.0–100.0)
Monocytes Absolute: 0.3 10*3/uL (ref 0.1–1.0)
Monocytes Relative: 8 %
NEUTROS ABS: 1.6 10*3/uL — AB (ref 1.7–7.7)
NEUTROS PCT: 49 %
PLATELETS: 123 10*3/uL — AB (ref 150–400)
RBC: 3.88 MIL/uL (ref 3.87–5.11)
RDW: 13.2 % (ref 11.5–15.5)
WBC: 3.3 10*3/uL — AB (ref 4.0–10.5)

## 2017-04-29 LAB — TROPONIN I
Troponin I: <0.03 ng/mL (ref <0.03)
Troponin I: <0.03 ng/mL (ref <0.03)

## 2017-04-29 LAB — EXERCISE TOLERANCE TEST
CHL CUP MPHR: 159 {beats}/min
CSEPHR: 112 %
Estimated workload: 8.2 METS
Exercise duration (min): 6 min
Exercise duration (sec): 49 s
Peak HR: 179 {beats}/min
RPE: 17
Rest HR: 65 {beats}/min

## 2017-04-29 LAB — TSH: TSH: 2.531 u[IU]/mL (ref 0.350–4.500)

## 2017-04-29 LAB — PROTIME-INR
INR: 1.05
Prothrombin Time: 13.8 seconds (ref 11.4–15.2)

## 2017-04-29 LAB — T4, FREE: FREE T4: 0.79 ng/dL (ref 0.61–1.12)

## 2017-04-29 LAB — MAGNESIUM: MAGNESIUM: 1.8 mg/dL (ref 1.7–2.4)

## 2017-04-29 MED ORDER — PANTOPRAZOLE SODIUM 40 MG PO TBEC
40.0000 mg | DELAYED_RELEASE_TABLET | Freq: Two times a day (BID) | ORAL | Status: DC
  Administered 2017-04-29 – 2017-05-03 (×8): 40 mg via ORAL
  Filled 2017-04-29 (×8): qty 1

## 2017-04-29 MED ORDER — DIVALPROEX SODIUM ER 500 MG PO TB24
1000.0000 mg | ORAL_TABLET | Freq: Every day | ORAL | Status: DC
  Administered 2017-04-29 – 2017-05-02 (×4): 1000 mg via ORAL
  Filled 2017-04-29 (×5): qty 2

## 2017-04-29 MED ORDER — SODIUM CHLORIDE 0.9% FLUSH: 3.0000 mL | INTRAVENOUS | Status: DC | PRN

## 2017-04-29 MED ORDER — SODIUM CHLORIDE 0.9 % WEIGHT BASED INFUSION
3.0000 mL/kg/h | INTRAVENOUS | Status: DC
Start: 2017-04-30 — End: 2017-04-30
  Administered 2017-04-30: 3 mL/kg/h via INTRAVENOUS

## 2017-04-29 MED ORDER — SODIUM CHLORIDE 0.9 % IV SOLN: 250.0000 mL | INTRAVENOUS | Status: DC | PRN

## 2017-04-29 MED ORDER — MAGNESIUM SULFATE 2 GM/50ML IV SOLN
2.0000 g | Freq: Once | INTRAVENOUS | Status: AC
  Administered 2017-04-29: 2 g via INTRAVENOUS
  Filled 2017-04-29: qty 50

## 2017-04-29 MED ORDER — POLYETHYLENE GLYCOL 3350 17 G PO PACK: 17.0000 g | PACK | Freq: Every day | ORAL | Status: DC | PRN

## 2017-04-29 MED ORDER — HEPARIN SODIUM (PORCINE) 5000 UNIT/ML IJ SOLN
5000.0000 [IU] | Freq: Three times a day (TID) | INTRAMUSCULAR | Status: DC
  Administered 2017-04-29 – 2017-05-02 (×6): 5000 [IU] via SUBCUTANEOUS
  Filled 2017-04-29 (×6): qty 1

## 2017-04-29 MED ORDER — ADULT MULTIVITAMIN W/MINERALS CH
1.0000 | ORAL_TABLET | Freq: Every morning | ORAL | Status: DC
  Administered 2017-04-30 – 2017-05-03 (×4): 1 via ORAL
  Filled 2017-04-29 (×4): qty 1

## 2017-04-29 MED ORDER — ASPIRIN EC 325 MG PO TBEC
325.0000 mg | DELAYED_RELEASE_TABLET | Freq: Every day | ORAL | Status: DC
  Administered 2017-04-30 – 2017-05-03 (×4): 325 mg via ORAL
  Filled 2017-04-29 (×4): qty 1

## 2017-04-29 MED ORDER — NITROGLYCERIN 0.4 MG SL SUBL: 0.4000 mg | SUBLINGUAL_TABLET | SUBLINGUAL | Status: DC | PRN

## 2017-04-29 MED ORDER — POTASSIUM CHLORIDE CRYS ER 20 MEQ PO TBCR
40.0000 meq | EXTENDED_RELEASE_TABLET | Freq: Once | ORAL | Status: AC
  Administered 2017-04-29: 40 meq via ORAL
  Filled 2017-04-29: qty 2

## 2017-04-29 MED ORDER — CALCIUM CARBONATE-VITAMIN D 500-200 MG-UNIT PO TABS
1.0000 | ORAL_TABLET | Freq: Every day | ORAL | Status: DC
  Administered 2017-04-30 – 2017-05-03 (×4): 1 via ORAL
  Filled 2017-04-29 (×4): qty 1

## 2017-04-29 MED ORDER — ACETAMINOPHEN 325 MG PO TABS
650.0000 mg | ORAL_TABLET | ORAL | Status: DC | PRN
  Administered 2017-04-29 – 2017-05-01 (×3): 650 mg via ORAL
  Filled 2017-04-29 (×3): qty 2

## 2017-04-29 MED ORDER — SODIUM CHLORIDE 0.9% FLUSH
3.0000 mL | Freq: Two times a day (BID) | INTRAVENOUS | Status: DC
  Administered 2017-04-29 – 2017-05-02 (×4): 3 mL via INTRAVENOUS

## 2017-04-29 MED ORDER — ONDANSETRON HCL 4 MG/2ML IJ SOLN: 4.0000 mg | Freq: Four times a day (QID) | INTRAMUSCULAR | Status: DC | PRN

## 2017-04-29 MED ORDER — CLOBAZAM 10 MG PO TABS
10.0000 mg | ORAL_TABLET | Freq: Two times a day (BID) | ORAL | Status: DC
  Administered 2017-04-29 – 2017-05-03 (×8): 10 mg via ORAL
  Filled 2017-04-29 (×8): qty 1

## 2017-04-29 MED ORDER — SODIUM CHLORIDE 0.9 % WEIGHT BASED INFUSION: 1.0000 mL/kg/h | INTRAVENOUS | Status: DC

## 2017-04-29 MED ORDER — ASPIRIN 81 MG PO CHEW
81.0000 mg | CHEWABLE_TABLET | ORAL | Status: AC
  Administered 2017-04-30: 81 mg via ORAL
  Filled 2017-04-29: qty 1

## 2017-04-29 MED ORDER — METOPROLOL TARTRATE 12.5 MG HALF TABLET
12.5000 mg | ORAL_TABLET | Freq: Two times a day (BID) | ORAL | Status: DC
  Administered 2017-04-29 – 2017-05-03 (×8): 12.5 mg via ORAL
  Filled 2017-04-29 (×8): qty 1

## 2017-04-29 MED ORDER — PANTOPRAZOLE SODIUM 40 MG PO TBEC: 40.0000 mg | DELAYED_RELEASE_TABLET | Freq: Every day | ORAL | Status: DC

## 2017-04-29 NOTE — Progress Notes (Signed)
Pt states she takes depakote qhs thus will order this way. She states she took aspirin already today. Clarified with Dr. Stanford Breed OK to eat today, no full dose heparin unless enzymes positive. Dayna Dunn PA-C

## 2017-04-29 NOTE — Progress Notes (Signed)
HPI: 61 year old female admitted with ventricular tachycardia. Patient states he she has had an irregular heartbeat for years. She does have a history of seizures and has been noted to have bigeminy and PVCs during hospital evaluations for those issues. She has had approximately 5 episodes in her life of sudden onset chest tightness, weakness, dizziness with associated palpitations. They typically last 5-10 minutes and resolve spontaneously. She had her most recent episode on Easter Sunday. She otherwise has some dyspnea with more vigorous activities but not routine activities. No orthopnea, PND, pedal edema, exertional chest pain and no syncope. She was seen recently by Dr. Alvester Chou and an exercise treadmill was ordered. This was performed in the office today. Her baseline electrocardiogram revealed sinus rhythm with minor nonspecific ST changes. With exercise she developed ventricular tachycardia. The treadmill was stopped and her VT resolved. She then had 1 mm of ST depression in the inferior leads concerning for possible ischemia. She was therefore added to my schedule. She is presently asymptomatic.  Current Outpatient Prescriptions  Medication Sig Dispense Refill  . aspirin EC 325 MG tablet Take 325 mg by mouth every morning.    . calcium-vitamin D (OSCAL WITH D) 250-125 MG-UNIT tablet Take by mouth.    . cloBAZam (ONFI) 10 MG tablet Take 10 mg by mouth 2 (two) times daily.    . divalproex (DEPAKOTE ER) 500 MG 24 hr tablet Take 1,000 mg by mouth daily.    . Multiple Vitamin (MULITIVITAMIN WITH MINERALS) TABS Take 1 tablet by mouth every morning.     . naproxen sodium (ANAPROX) 220 MG tablet Take 220 mg by mouth every morning.    Marland Kitchen omeprazole (PRILOSEC) 20 MG capsule Take 20 mg by mouth 2 (two) times daily.    . polyethylene glycol (MIRALAX / GLYCOLAX) packet Take 17 g by mouth daily.     No current facility-administered medications for this visit.     No Known Allergies   Past Medical  History:  Diagnosis Date  . Arthritis lower back/hands  . 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 Cancer [facility]) breast tumor removed  . Leg fracture   . Seizure disorder ([place])   . Seizures ([place])     Past Surgical History:  Procedure Laterality Date  . BREAST SURGERY    . FACIAL FRACTURE SURGERY  orbital bone repair    Social History   Social History  . Marital status: Married    Spouse name: N/A  . Number of children: N/A  . Years of education: N/A   Occupational History  . Not on file.   Social History Main Topics  . Smoking status: Never Smoker  . Smokeless tobacco: Never Used  . Alcohol use No  . Drug use: No  . Sexual activity: Not on file   Other Topics Concern  . Not on file   Social History Narrative  . No narrative on file    Family History  Problem Relation Age of Onset  . Heart failure Mother     ROS: no fevers or chills, productive cough, hemoptysis, dysphasia, odynophagia, melena, hematochezia, dysuria, hematuria, rash, seizure activity, orthopnea, PND, pedal edema, claudication. Remaining systems are negative.  Physical Exam:   Blood pressure 100/68, pulse 70, height 5' 4.5" (1.638 m), weight 133 lb (60.3 kg).  General:  Well developed/well nourished in NAD Skin warm/dry Patient not depressed No peripheral clubbing Back-normal HEENT-normal/normal eyelids Neck supple/normal carotid upstroke bilaterally; no bruits; no JVD; no thyromegaly chest - CTA/ normal  expansion CV - RRR/normal S1 and S2; no murmurs, rubs or gallops;  PMI nondisplaced Abdomen -NT/ND, no HSM, no mass, + bowel sounds, no bruit 2+ femoral pulses, no bruits Ext-no edema, chords, 2+ DP Neuro-grossly nonfocal  ECG - normal sinus rhythm, nonspecific ST changes. personally reviewed  A/P  1 ventricular tachycardia-patient developed ventricular tachycardia during her exercise treadmill. She states she has a long history of irregular heartbeat and PVCs. She also has occasional palpitations.  She has had episodes of chest tightness with associated dizziness, weakness, palpitations and near syncope the last occurring on Easter Sunday. This episode may have been ventricular tachycardia. She is also noted to have ST changes in recovery concerning for ischemia. I will admit and cycle enzymes. Schedule echocardiogram to assess LV function. Proceed with cardiac catheterization to exclude coronary disease. The risks and benefits were discussed and she agrees to proceed (risks include myocardial infarction, CVA and death). If she has no coronary disease she would likely need electrophysiology consultation. Add metoprolol 12.5 mg twice a day.  2 Abnormal exercise treadmill-patient noted to have ST depression in the inferior leads in recovery. Plan as outlined above.  3 history of seizure disorder-continue present medications.  Kirk Ruths, MD

## 2017-04-29 NOTE — Patient Instructions (Signed)
GO TO THE [facility] ENTRANCE A AT [place] OFFICE

## 2017-04-29 NOTE — Progress Notes (Signed)
K 3.5, Mg 1.8. Will give 40 meq KCl and 2g mag sulfate. F/u lytes in AM.  Melina Copa PA-C

## 2017-04-29 NOTE — H&P (Signed)
Lelon Perla, MD Physician Signed Cardiology  Progress Notes Encounter Date: 04/29/2017  Related encounter: Office Visit from 04/29/2017 in [place]       [] Hide copied text [] Hover for attribution information    HPI: 61 year old female admitted with ventricular tachycardia. Patient states he she has had an irregular heartbeat for years. She does have a history of seizures and has been noted to have bigeminy and PVCs during hospital evaluations for those issues. She has had approximately 5 episodes in her life of sudden onset chest tightness, weakness, dizziness with associated palpitations. They typically last 5-10 minutes and resolve spontaneously. She had her most recent episode on Easter Sunday. She otherwise has some dyspnea with more vigorous activities but not routine activities. No orthopnea, PND, pedal edema, exertional chest pain and no syncope. She was seen recently by Dr. Alvester Chou and an exercise treadmill was ordered. This was performed in the office today. Her baseline electrocardiogram revealed sinus rhythm with minor nonspecific ST changes. With exercise she developed ventricular tachycardia. The treadmill was stopped and her VT resolved. She then had 1 mm of ST depression in the inferior leads concerning for possible ischemia. She was therefore added to my schedule. She is presently asymptomatic.        Current Outpatient Prescriptions  Medication Sig Dispense Refill  . aspirin EC 325 MG tablet Take 325 mg by mouth every morning.    . calcium-vitamin D (OSCAL WITH D) 250-125 MG-UNIT tablet Take by mouth.    . cloBAZam (ONFI) 10 MG tablet Take 10 mg by mouth 2 (two) times daily.    . divalproex (DEPAKOTE ER) 500 MG 24 hr tablet Take 1,000 mg by mouth daily.    . Multiple Vitamin (MULITIVITAMIN WITH MINERALS) TABS Take 1 tablet by mouth every morning.     . naproxen sodium (ANAPROX) 220 MG tablet Take 220 mg by mouth every morning.    Marland Kitchen omeprazole  (PRILOSEC) 20 MG capsule Take 20 mg by mouth 2 (two) times daily.    . polyethylene glycol (MIRALAX / GLYCOLAX) packet Take 17 g by mouth daily.     No current facility-administered medications for this visit.     No Known Allergies       Past Medical History:  Diagnosis Date  . Arthritis lower back/hands  . 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 Cancer [facility]) breast tumor removed  . Leg fracture   . Seizure disorder ([place])   . Seizures ([place])          Past Surgical History:  Procedure Laterality Date  . BREAST SURGERY    . FACIAL FRACTURE SURGERY  orbital bone repair    Social History        Social History  . Marital status: Married    Spouse name: N/A  . Number of children: N/A  . Years of education: N/A      Occupational History  . Not on file.       Social History Main Topics  . Smoking status: Never Smoker  . Smokeless tobacco: Never Used  . Alcohol use No  . Drug use: No  . Sexual activity: Not on file   Other Topics Concern  . Not on file      Social History Narrative  . No narrative on file         Family History  Problem Relation Age of Onset  . Heart failure Mother     ROS: no fevers or chills, productive cough,  hemoptysis, dysphasia, odynophagia, melena, hematochezia, dysuria, hematuria, rash, seizure activity, orthopnea, PND, pedal edema, claudication. Remaining systems are negative.  Physical Exam:   Blood pressure 100/68, pulse 70, height 5' 4.5" (1.638 m), weight 133 lb (60.3 kg).  General:  Well developed/well nourished in NAD Skin warm/dry Patient not depressed No peripheral clubbing Back-normal HEENT-normal/normal eyelids Neck supple/normal carotid upstroke bilaterally; no bruits; no JVD; no thyromegaly chest - CTA/ normal expansion CV - RRR/normal S1 and S2; no murmurs, rubs or gallops;  PMI nondisplaced Abdomen -NT/ND, no HSM, no mass, + bowel sounds, no bruit 2+ femoral pulses, no bruits Ext-no edema,  chords, 2+ DP Neuro-grossly nonfocal  ECG - normal sinus rhythm, nonspecific ST changes. personally reviewed  A/P  1 ventricular tachycardia-patient developed ventricular tachycardia during her exercise treadmill. She states she has a long history of irregular heartbeat and PVCs. She also has occasional palpitations. She has had episodes of chest tightness with associated dizziness, weakness, palpitations and near syncope the last occurring on Easter Sunday. This episode may have been ventricular tachycardia. She is also noted to have ST changes in recovery concerning for ischemia. I will admit and cycle enzymes. Schedule echocardiogram to assess LV function. Proceed with cardiac catheterization to exclude coronary disease. The risks and benefits were discussed and she agrees to proceed (risks include myocardial infarction, CVA and death). If she has no coronary disease she would likely need electrophysiology consultation. Add metoprolol 12.5 mg twice a day.  2 Abnormal exercise treadmill-patient noted to have ST depression in the inferior leads in recovery. Plan as outlined above.  3 history of seizure disorder-continue present medications.  Kirk Ruths, MD

## 2017-04-30 ENCOUNTER — Encounter (HOSPITAL_COMMUNITY): Payer: Self-pay | Admitting: Cardiology

## 2017-04-30 ENCOUNTER — Encounter (HOSPITAL_COMMUNITY)
Admission: AD | Disposition: A | Discharge status: Home / Self Care | Payer: Self-pay | Source: Ambulatory Visit | Attending: Cardiology

## 2017-04-30 DIAGNOSIS — I472 Ventricular tachycardia: Principal | ICD-10-CM

## 2017-04-30 HISTORY — PX: LEFT HEART CATH AND CORONARY ANGIOGRAPHY: CATH118249

## 2017-04-30 LAB — BASIC METABOLIC PANEL
Anion gap: 9 (ref 5–15)
BUN: 9 mg/dL (ref 6–20)
CHLORIDE: 96 mmol/L — AB (ref 101–111)
CO2: 28 mmol/L (ref 22–32)
Calcium: 8.8 mg/dL — ABNORMAL LOW (ref 8.9–10.3)
Creatinine, Ser: 0.74 mg/dL (ref 0.44–1.00)
GFR calc Af Amer: >60 mL/min (ref >60)
GFR calc non Af Amer: >60 mL/min (ref >60)
Glucose, Bld: 85 mg/dL (ref 65–99)
Potassium: 4.2 mmol/L (ref 3.5–5.1)
SODIUM: 133 mmol/L — AB (ref 135–145)

## 2017-04-30 LAB — LIPID PANEL
CHOL/HDL RATIO: 1.8 ratio
Cholesterol: 188 mg/dL (ref 0–200)
HDL: 106 mg/dL (ref >40)
LDL CALC: 77 mg/dL (ref 0–99)
Triglycerides: 24 mg/dL (ref <150)
VLDL: 5 mg/dL (ref 0–40)

## 2017-04-30 LAB — CBC
HEMATOCRIT: 37.6 % (ref 36.0–46.0)
HEMOGLOBIN: 12.7 g/dL (ref 12.0–15.0)
MCH: 30.6 pg (ref 26.0–34.0)
MCHC: 33.8 g/dL (ref 30.0–36.0)
MCV: 90.6 fL (ref 78.0–100.0)
Platelets: 131 10*3/uL — ABNORMAL LOW (ref 150–400)
RBC: 4.15 MIL/uL (ref 3.87–5.11)
RDW: 12.9 % (ref 11.5–15.5)
WBC: 3 10*3/uL — AB (ref 4.0–10.5)

## 2017-04-30 LAB — MAGNESIUM: Magnesium: 2.2 mg/dL (ref 1.7–2.4)

## 2017-04-30 LAB — HIV ANTIBODY (ROUTINE TESTING W REFLEX): HIV SCREEN 4TH GENERATION: NONREACTIVE

## 2017-04-30 LAB — TROPONIN I

## 2017-04-30 SURGERY — LEFT HEART CATH AND CORONARY ANGIOGRAPHY
Anesthesia: LOCAL

## 2017-04-30 MED ORDER — VERAPAMIL HCL 2.5 MG/ML IV SOLN
INTRAVENOUS | Status: AC
  Filled 2017-04-30: qty 2

## 2017-04-30 MED ORDER — SODIUM CHLORIDE 0.9 % IV SOLN: INTRAVENOUS | Status: AC

## 2017-04-30 MED ORDER — HEPARIN SODIUM (PORCINE) 1000 UNIT/ML IJ SOLN
INTRAMUSCULAR | Status: DC | PRN
  Administered 2017-04-30: 3500 [IU] via INTRAVENOUS

## 2017-04-30 MED ORDER — LIDOCAINE HCL 1 % IJ SOLN
INTRAMUSCULAR | Status: AC
  Filled 2017-04-30: qty 20

## 2017-04-30 MED ORDER — MIDAZOLAM HCL 2 MG/2ML IJ SOLN
INTRAMUSCULAR | Status: DC | PRN
  Administered 2017-04-30: 2 mg via INTRAVENOUS

## 2017-04-30 MED ORDER — FENTANYL CITRATE (PF) 100 MCG/2ML IJ SOLN
INTRAMUSCULAR | Status: AC
  Filled 2017-04-30: qty 2

## 2017-04-30 MED ORDER — SODIUM CHLORIDE 0.9 % IV SOLN
250.0000 mL | INTRAVENOUS | Status: DC | PRN
Start: 2017-04-30 — End: 2017-05-03

## 2017-04-30 MED ORDER — SODIUM CHLORIDE 0.9% FLUSH
3.0000 mL | Freq: Two times a day (BID) | INTRAVENOUS | Status: DC
  Administered 2017-04-30 – 2017-05-03 (×6): 3 mL via INTRAVENOUS

## 2017-04-30 MED ORDER — HEPARIN (PORCINE) IN NACL 2-0.9 UNIT/ML-% IJ SOLN
INTRAMUSCULAR | Status: DC | PRN
  Administered 2017-04-30: 1000 mL

## 2017-04-30 MED ORDER — IOPAMIDOL (ISOVUE-370) INJECTION 76%
INTRAVENOUS | Status: AC
  Filled 2017-04-30: qty 100

## 2017-04-30 MED ORDER — LIDOCAINE HCL (PF) 1 % IJ SOLN
INTRAMUSCULAR | Status: DC | PRN
  Administered 2017-04-30: 2 mL

## 2017-04-30 MED ORDER — SODIUM CHLORIDE 0.9% FLUSH: 3.0000 mL | INTRAVENOUS | Status: DC | PRN

## 2017-04-30 MED ORDER — MIDAZOLAM HCL 2 MG/2ML IJ SOLN
INTRAMUSCULAR | Status: AC
  Filled 2017-04-30: qty 2

## 2017-04-30 MED ORDER — HEPARIN (PORCINE) IN NACL 2-0.9 UNIT/ML-% IJ SOLN
INTRAMUSCULAR | Status: AC
  Filled 2017-04-30: qty 1000

## 2017-04-30 MED ORDER — FENTANYL CITRATE (PF) 100 MCG/2ML IJ SOLN
INTRAMUSCULAR | Status: DC | PRN
  Administered 2017-04-30: 25 ug via INTRAVENOUS

## 2017-04-30 MED ORDER — VERAPAMIL HCL 2.5 MG/ML IV SOLN
INTRAVENOUS | Status: DC | PRN
  Administered 2017-04-30: 10 mL via INTRA_ARTERIAL

## 2017-04-30 MED ORDER — IOPAMIDOL (ISOVUE-370) INJECTION 76%
INTRAVENOUS | Status: DC | PRN
  Administered 2017-04-30: 60 mL via INTRA_ARTERIAL

## 2017-04-30 MED ORDER — HEPARIN SODIUM (PORCINE) 1000 UNIT/ML IJ SOLN
INTRAMUSCULAR | Status: AC
  Filled 2017-04-30: qty 1

## 2017-04-30 SURGICAL SUPPLY — 10 items
CATH INFINITI 5FR ANG PIGTAIL (CATHETERS) ×1 IMPLANT
CATH OPTITORQUE TIG 4.0 5F (CATHETERS) ×1 IMPLANT
DEVICE RAD COMP TR BAND LRG (VASCULAR PRODUCTS) ×1 IMPLANT
GLIDESHEATH SLEND A-KIT 6F 22G (SHEATH) ×2 IMPLANT
GUIDEWIRE INQWIRE 1.5J.035X260 (WIRE) IMPLANT
INQWIRE 1.5J .035X260CM (WIRE) ×2
KIT HEART LEFT (KITS) ×2 IMPLANT
PACK CARDIAC CATHETERIZATION (CUSTOM PROCEDURE TRAY) ×2 IMPLANT
TRANSDUCER W/STOPCOCK (MISCELLANEOUS) ×2 IMPLANT
TUBING CIL FLEX 10 FLL-RA (TUBING) ×2 IMPLANT

## 2017-04-30 NOTE — Interval H&P Note (Signed)
History and Physical Interval Note:  04/30/2017 9:10 AM  [name]  has presented today for surgery, with the diagnosis of Abnormal Nuclear ST with VT on TM.   The various methods of treatment have been discussed with the patient and family. After consideration of risks, benefits and other options for treatment, the patient has consented to  Procedure(s): Left Heart Cath and Coronary Angiography (N/A) with Possible Percutaneous Coronary Intervention as a surgical intervention .  The patient's history has been reviewed, patient examined, no change in status, stable for surgery.  I have reviewed the patient's chart and labs.  Questions were answered to the patient's satisfaction.    Cath Lab Visit (complete for each Cath Lab visit)  Clinical Evaluation Leading to the Procedure:   ACS: No.  Non-ACS:    Anginal Classification: No Symptoms - VT  Anti-ischemic medical therapy: Minimal Therapy (1 class of medications)  Non-Invasive Test Results: High-risk stress test findings: cardiac mortality >3%/year - VT on GXT followed by ST Depression  Prior CABG: No previous CABG   Glenetta Hew

## 2017-04-30 NOTE — Consult Note (Signed)
CARDIOLOGY CONSULT NOTE     Primary Care Physician: Emily Nip, MD Referring Physician: [place] [facility] Date: 04/29/2017  Reason for consultation: VT  [name] is a 61 y.o. female who is being seen today for the evaluation of VT at the request of [name].   [name] is a 61 y.o. female resented to the House after an episode of VT found during exercise treadmill testing. The patient has had palpitations for years. She does have history of seizures and has been noted to have bigeminy with PVCs during hospitalizations for her seizures. She has had up to 5 episodes of palpitations. Palpitations usually last 5-10 minutes. Her most recent episode was Sunday, where she had diaphoresis and chest pain. An exercise treadmill test was ordered. During stage III, she went into a sustained episode of ventricular tachycardia. The treadmill was stopped, and her VT converted to sinus rhythm. She does say that she had shortness of breath associated with her episode of VT while on the treadmill. She otherwise feels well without major complaint. She notes no exacerbating or alleviating factors for her VT.  Past Medical History:  Diagnosis Date  . Arthritis lower back/hands  . Brain injury ([name])   . Cancer [name]) breast tumor removed  . Leg fracture   . Seizure disorder ([place])   . Seizures ([place])   . VT (ventricular tachycardia) ([place]) 04/29/2017   Past Surgical History:  Procedure Laterality Date  . BREAST SURGERY    . FACIAL FRACTURE SURGERY  orbital bone repair  . LEFT HEART CATH AND CORONARY ANGIOGRAPHY N/A 04/30/2017   Procedure: Left Heart Cath and Coronary Angiography;  Surgeon: Leonie Man, MD;  Location: [facility];  Service: Cardiovascular;  Laterality: N/A;    . aspirin EC  325 mg Oral Daily  . calcium-vitamin D  1 tablet Oral Daily  . cloBAZam  10 mg Oral BID  . divalproex  1,000 mg Oral QHS  . heparin  5,000 Units Subcutaneous Q8H  . metoprolol tartrate   12.5 mg Oral BID  . multivitamin with minerals  1 tablet Oral q morning - 10a  . pantoprazole  40 mg Oral BID  . sodium chloride flush  3 mL Intravenous Q12H  . sodium chloride flush  3 mL Intravenous Q12H   . sodium chloride    . sodium chloride      No Known Allergies  Social History   Social History  . Marital status: Married    Spouse name: N/A  . Number of children: N/A  . Years of education: N/A   Occupational History  . Not on file.   Social History Main Topics  . Smoking status: Never Smoker  . Smokeless tobacco: Never Used  . Alcohol use No  . Drug use: No  . Sexual activity: Not on file   Other Topics Concern  . Not on file   Social History Narrative  . No narrative on file    Family History  Problem Relation Age of Onset  . Heart failure Mother     ROS- All systems are reviewed and negative except as per the HPI above  Physical Exam: Telemetry: Vitals:   04/30/17 0936 04/30/17 0941 04/30/17 0946 04/30/17 1309  BP: 119/78 121/73 110/74 115/76  Pulse: (!) 58 73 62 (!) 54  Resp: (!) 7 14 13 16   Temp:    97.8 F (36.6 C)  TempSrc:    Oral  SpO2: 100% 100% 98%  100%  Weight:      Height:        GEN- The patient is well appearing, alert and oriented x 3 today.   Head- normocephalic, atraumatic Eyes-  Sclera clear, conjunctiva pink Ears- hearing intact Oropharynx- clear Neck- supple, no JVP Lymph- no cervical lymphadenopathy Lungs- Clear to ausculation bilaterally, normal work of breathing Heart- Regular rate and rhythm, no murmurs, rubs or gallops, PMI not laterally displaced GI- soft, NT, ND, + BS Extremities- no clubbing, cyanosis, or edema MS- no significant deformity or atrophy Skin- no rash or lesion Psych- euthymic mood, full affect Neuro- strength and sensation are intact  Telemetry: Sinus rhythm-personally reviewed   Labs:   Lab Results  Component Value Date   WBC 3.0 (L) 04/30/2017   HGB 12.7 04/30/2017   HCT 37.6  04/30/2017   MCV 90.6 04/30/2017   PLT 131 (L) 04/30/2017    Recent Labs Lab 04/29/17 1530 04/30/17 0338  NA 128* 133*  K 3.5 4.2  CL 92* 96*  CO2 29 28  BUN 11 9  CREATININE 0.72 0.74  CALCIUM 8.5* 8.8*  PROT 6.0*  --   BILITOT 0.2*  --   ALKPHOS 57  --   ALT 16  --   AST 24  --   GLUCOSE 128* 85   Lab Results  Component Value Date   TROPONINI <0.03 04/30/2017    Lab Results  Component Value Date   CHOL 188 04/30/2017   Lab Results  Component Value Date   HDL 106 04/30/2017   Lab Results  Component Value Date   LDLCALC 77 04/30/2017   Lab Results  Component Value Date   TRIG 24 04/30/2017   Lab Results  Component Value Date   CHOLHDL 1.8 04/30/2017   No results found for: LDLDIRECT    Echo: pending  Cardiac cath  The left ventricular systolic function is normal. The left ventricular ejection fraction is greater than 65% by visual estimate.  LV end diastolic pressure is normal. 11 mmHg  Angiographically normal coronary arteries  ASSESSMENT AND PLAN: VT  1. Ventricular tachycardia: History of palpitations and diaphoresis, diagnosed with ventricular tachycardia during exercise treadmill testing. Has had a left heart catheterization that showed no evidence of coronary artery disease with a normal EF on LV gram. She has not yet had an echocardiogram, [name] obtain one today. Pending her TTE, would plan for cardiac MRI to further evaluate her for any evidence of scar. If she is not have scar, she may benefit from flecainide therapy for her ventricular tachycardia.  [name] Meredith Leeds, MD 04/30/2017  2:17 PM

## 2017-05-01 ENCOUNTER — Inpatient Hospital Stay (HOSPITAL_COMMUNITY): Payer: BC Managed Care – PPO

## 2017-05-01 ENCOUNTER — Other Ambulatory Visit (HOSPITAL_COMMUNITY): Payer: BC Managed Care – PPO

## 2017-05-01 DIAGNOSIS — I472 Ventricular tachycardia: Secondary | ICD-10-CM

## 2017-05-01 MED ORDER — GADOBENATE DIMEGLUMINE 529 MG/ML IV SOLN
20.0000 mL | Freq: Once | INTRAVENOUS | Status: AC
  Administered 2017-05-01: 20 mL via INTRAVENOUS

## 2017-05-01 NOTE — Progress Notes (Signed)
Progress Note  Patient Name: [name] Date of Encounter: 05/01/2017  Primary Cardiologist: Dr. Gwenlyn House  Subjective   Feeling well.  Notes occasional palpitations. No chest pain or shortness of breath.   Inpatient Medications    Scheduled Meds: . aspirin EC  325 mg Oral Daily  . calcium-vitamin D  1 tablet Oral Daily  . cloBAZam  10 mg Oral BID  . divalproex  1,000 mg Oral QHS  . heparin  5,000 Units Subcutaneous Q8H  . metoprolol tartrate  12.5 mg Oral BID  . multivitamin with minerals  1 tablet Oral q morning - 10a  . pantoprazole  40 mg Oral BID  . sodium chloride flush  3 mL Intravenous Q12H  . sodium chloride flush  3 mL Intravenous Q12H   Continuous Infusions: . sodium chloride    . sodium chloride     PRN Meds: sodium chloride, sodium chloride, acetaminophen, nitroGLYCERIN, ondansetron (ZOFRAN) IV, polyethylene glycol, sodium chloride flush, sodium chloride flush   Vital Signs    Vitals:   04/30/17 1309 04/30/17 2130 05/01/17 0453 05/01/17 0811  BP: 115/76 (!) 110/52 103/67   Pulse: (!) 54 61 (!) 58 (!) 50  Resp: 16     Temp: 97.8 F (36.6 C) 98.1 F (36.7 C) 98.4 F (36.9 C)   TempSrc: Oral Oral Oral   SpO2: 100% 98% 99%   Weight:   59.4 kg (131 lb)   Height:        Intake/Output Summary (Last 24 hours) at 05/01/17 1250 Last data filed at 05/01/17 0800  Gross per 24 hour  Intake              120 ml  Output             1300 ml  Net            -1180 ml   Filed Weights   04/29/17 1348 04/30/17 0313 05/01/17 0453  Weight: 60.3 kg (132 lb 14.4 oz) 58.9 kg (129 lb 12.8 oz) 59.4 kg (131 lb)    Telemetry    Sinus rhythm.  4 beats NSVT - Personally Reviewed  ECG    n/a - Personally Reviewed  Physical Exam   GEN: No acute distress.   Neck: No JVD Cardiac: RRR, no murmurs, rubs, or gallops.  Respiratory: Clear to auscultation bilaterally. GI: Soft, nontender, non-distended  MS: No edema; No deformity. Neuro:  Nonfocal  Psych: Normal  affect   Labs    Chemistry Recent Labs Lab 04/29/17 1530 04/30/17 0338  NA 128* 133*  K 3.5 4.2  CL 92* 96*  CO2 29 28  GLUCOSE 128* 85  BUN 11 9  CREATININE 0.72 0.74  CALCIUM 8.5* 8.8*  PROT 6.0*  --   ALBUMIN 3.5  --   AST 24  --   ALT 16  --   ALKPHOS 57  --   BILITOT 0.2*  --   GFRNONAA >60 >60  GFRAA >60 >60  ANIONGAP 7 9     Hematology Recent Labs Lab 04/29/17 1530 04/30/17 0338  WBC 3.3* 3.0*  RBC 3.88 4.15  HGB 12.3 12.7  HCT 35.1* 37.6  MCV 90.5 90.6  MCH 31.7 30.6  MCHC 35.0 33.8  RDW 13.2 12.9  PLT 123* 131*    Cardiac Enzymes Recent Labs Lab 04/29/17 1530 04/29/17 2003 04/30/17 0338  TROPONINI <0.03 <0.03 <0.03   No results for input(s): TROPIPOC in the last 168 hours.   BNPNo results  for input(s): BNP, PROBNP in the last 168 hours.   DDimer No results for input(s): DDIMER in the last 168 hours.   Radiology    No results House.  Cardiac Studies   Echo pending  LHC 04/30/17: The left ventricular systolic function is normal. The left ventricular ejection fraction is greater than 65% by visual estimate.  LV end diastolic pressure is normal. 11 mmHg  Angiographically normal coronary arteries   Angiographic normal coronary arteries with normal LV function and EDP. Suspect electrical etiology for sustained VT.  Patient Profile     61 y.o. female with no significant PMH admitted for VT on ETT.   Assessment & Plan    # VT: [name] has sustained VT during ETT.  LHC yesterday did not reveal any CAD.  Echo pending.  EP evaluated her yesterday.  If normal LVEF plan for cardiac MRI.  If no evidence of scar, plan to start flecainide.  Baseline bradycardia prohibits beta blocker therapy.    Signed, Emily Latch, MD  05/01/2017, 12:50 PM

## 2017-05-02 ENCOUNTER — Inpatient Hospital Stay (HOSPITAL_COMMUNITY): Payer: BC Managed Care – PPO

## 2017-05-02 DIAGNOSIS — I472 Ventricular tachycardia: Secondary | ICD-10-CM

## 2017-05-02 MED ORDER — HYDROCORTISONE 0.5 % EX CREA
TOPICAL_CREAM | CUTANEOUS | Status: DC | PRN
  Administered 2017-05-02: 21:00:00 via TOPICAL
  Filled 2017-05-02: qty 28.35

## 2017-05-02 NOTE — Progress Notes (Signed)
Progress Note  Patient Name: [name] Date of Encounter: 05/02/2017  Primary Cardiologist: Dr. Gwenlyn Found  Subjective   No chest pain or SOB. Occasional palpitations. .   Inpatient Medications    Scheduled Meds: . aspirin EC  325 mg Oral Daily  . calcium-vitamin D  1 tablet Oral Daily  . cloBAZam  10 mg Oral BID  . divalproex  1,000 mg Oral QHS  . heparin  5,000 Units Subcutaneous Q8H  . metoprolol tartrate  12.5 mg Oral BID  . multivitamin with minerals  1 tablet Oral q morning - 10a  . pantoprazole  40 mg Oral BID  . sodium chloride flush  3 mL Intravenous Q12H  . sodium chloride flush  3 mL Intravenous Q12H   Continuous Infusions: . sodium chloride    . sodium chloride     PRN Meds: sodium chloride, sodium chloride, acetaminophen, nitroGLYCERIN, ondansetron (ZOFRAN) IV, polyethylene glycol, sodium chloride flush, sodium chloride flush   Vital Signs    Vitals:   05/01/17 0811 05/01/17 1450 05/01/17 2146 05/02/17 0527  BP:  106/62 (!) 102/54 104/62  Pulse: (!) 50 60 64 65  Resp:  16    Temp:  98.1 F (36.7 C) 98 F (36.7 C) 98.6 F (37 C)  TempSrc:  Oral Oral Oral  SpO2:  99% 99% 98%  Weight:    129 lb 11.2 oz (58.8 kg)  Height:        Intake/Output Summary (Last 24 hours) at 05/02/17 1040 Last data filed at 05/02/17 0900  Gross per 24 hour  Intake              440 ml  Output             2200 ml  Net            -1760 ml   Filed Weights   04/30/17 0313 05/01/17 0453 05/02/17 0527  Weight: 129 lb 12.8 oz (58.9 kg) 131 lb (59.4 kg) 129 lb 11.2 oz (58.8 kg)    Telemetry    Sinus rhythm.  Rare PVCs  ECG     Physical Exam   GEN: No acute distress.   Neck: No JVD Cardiac: RRR, no murmurs, rubs, or gallops.  Respiratory: Clear to auscultation bilaterally. GI: Soft, nontender, non-distended  MS: No edema; No deformity. Neuro:  Nonfocal  Psych: Normal affect   Labs    Chemistry  Recent Labs Lab 04/29/17 1530 04/30/17 0338  NA 128* 133*    K 3.5 4.2  CL 92* 96*  CO2 29 28  GLUCOSE 128* 85  BUN 11 9  CREATININE 0.72 0.74  CALCIUM 8.5* 8.8*  PROT 6.0*  --   ALBUMIN 3.5  --   AST 24  --   ALT 16  --   ALKPHOS 57  --   BILITOT 0.2*  --   GFRNONAA >60 >60  GFRAA >60 >60  ANIONGAP 7 9     Hematology  Recent Labs Lab 04/29/17 1530 04/30/17 0338  WBC 3.3* 3.0*  RBC 3.88 4.15  HGB 12.3 12.7  HCT 35.1* 37.6  MCV 90.5 90.6  MCH 31.7 30.6  MCHC 35.0 33.8  RDW 13.2 12.9  PLT 123* 131*    Cardiac Enzymes  Recent Labs Lab 04/29/17 1530 04/29/17 2003 04/30/17 0338  TROPONINI <0.03 <0.03 <0.03   No results for input(s): TROPIPOC in the last 168 hours.   BNPNo results for input(s): BNP, PROBNP in the last 168 hours.  DDimer No results for input(s): DDIMER in the last 168 hours.   Radiology    No results found.  Cardiac Studies   Echo pending  LHC 04/30/17: The left ventricular systolic function is normal. The left ventricular ejection fraction is greater than 65% by visual estimate.  LV end diastolic pressure is normal. 11 mmHg  Angiographically normal coronary arteries   Angiographic normal coronary arteries with normal LV function and EDP. Suspect electrical etiology for sustained VT.  Patient Profile     61 y.o. female with no significant PMH admitted for VT on ETT.   Assessment & Plan    1. VT:  -  sustained VT during ETT.   - LHC with normal coronaries this admission - echo done this AM, results pending. Appears cardiac MRI done yesterday, awaiting read. Should have all info together by tomorrow to decide on medical therapy.  - from EP note if no significant scar by MRI, consider flecanide. Heart rate and bp limit beta blocker    Signed, Carlyle Dolly, MD  05/02/2017, 10:40 AM

## 2017-05-02 NOTE — Progress Notes (Signed)
  Echocardiogram 2D Echocardiogram with Bubble study has been performed.  Tresa Res 05/02/2017, 10:58 AM

## 2017-05-03 ENCOUNTER — Other Ambulatory Visit: Payer: Self-pay | Admitting: Physician Assistant

## 2017-05-03 ENCOUNTER — Encounter (HOSPITAL_COMMUNITY): Payer: Self-pay | Admitting: Physician Assistant

## 2017-05-03 DIAGNOSIS — Z5181 Encounter for therapeutic drug level monitoring: Secondary | ICD-10-CM

## 2017-05-03 DIAGNOSIS — Z79899 Other long term (current) drug therapy: Secondary | ICD-10-CM

## 2017-05-03 DIAGNOSIS — G40909 Epilepsy, unspecified, not intractable, without status epilepticus: Secondary | ICD-10-CM

## 2017-05-03 LAB — BASIC METABOLIC PANEL
Anion gap: 7 (ref 5–15)
BUN: 16 mg/dL (ref 6–20)
CHLORIDE: 93 mmol/L — AB (ref 101–111)
CO2: 30 mmol/L (ref 22–32)
CREATININE: 0.82 mg/dL (ref 0.44–1.00)
Calcium: 8.6 mg/dL — ABNORMAL LOW (ref 8.9–10.3)
GFR calc Af Amer: >60 mL/min (ref >60)
GFR calc non Af Amer: >60 mL/min (ref >60)
GLUCOSE: 89 mg/dL (ref 65–99)
Potassium: 4 mmol/L (ref 3.5–5.1)
SODIUM: 130 mmol/L — AB (ref 135–145)

## 2017-05-03 LAB — MAGNESIUM: MAGNESIUM: 1.7 mg/dL (ref 1.7–2.4)

## 2017-05-03 MED ORDER — FLECAINIDE ACETATE 100 MG PO TABS: 100.0000 mg | ORAL_TABLET | Freq: Two times a day (BID) | ORAL | 6 refills | Status: DC

## 2017-05-03 MED ORDER — METOPROLOL TARTRATE 25 MG PO TABS: 12.5000 mg | ORAL_TABLET | Freq: Two times a day (BID) | ORAL | 6 refills | Status: DC

## 2017-05-03 NOTE — Plan of Care (Signed)
Problem: Physical Regulation: Goal: Ability to maintain clinical measurements within normal limits will improve No arrhythmias noted over night, patient ambulatory in room

## 2017-05-03 NOTE — Progress Notes (Addendum)
Progress Note  Patient Name: [name] Date of Encounter: 05/03/2017  Primary Cardiologist: Dr. Gwenlyn Found  Subjective   Feels well. Eager to hear results of testing. No CP, palpitations, or dyspnea.   Inpatient Medications    Scheduled Meds: . aspirin EC  325 mg Oral Daily  . calcium-vitamin D  1 tablet Oral Daily  . cloBAZam  10 mg Oral BID  . divalproex  1,000 mg Oral QHS  . heparin  5,000 Units Subcutaneous Q8H  . metoprolol tartrate  12.5 mg Oral BID  . multivitamin with minerals  1 tablet Oral q morning - 10a  . pantoprazole  40 mg Oral BID  . sodium chloride flush  3 mL Intravenous Q12H  . sodium chloride flush  3 mL Intravenous Q12H   Continuous Infusions: . sodium chloride    . sodium chloride     PRN Meds: sodium chloride, sodium chloride, acetaminophen, hydrocortisone cream, nitroGLYCERIN, ondansetron (ZOFRAN) IV, polyethylene glycol, sodium chloride flush, sodium chloride flush   Vital Signs    Vitals:   05/02/17 1445 05/02/17 2219 05/03/17 0550 05/03/17 0600  BP: 125/74 (!) 118/50    Pulse: 67 63    Resp: 18  16   Temp: 97.9 F (36.6 C) 97.7 F (36.5 C) 97.8 F (36.6 C)   TempSrc: Oral Oral Oral   SpO2: 98% 98% 100%   Weight:    130 lb 6.4 oz (59.1 kg)  Height:        Intake/Output Summary (Last 24 hours) at 05/03/17 1214 Last data filed at 05/03/17 1100  Gross per 24 hour  Intake              720 ml  Output             1250 ml  Net             -530 ml   Filed Weights   05/01/17 0453 05/02/17 0527 05/03/17 0600  Weight: 131 lb (59.4 kg) 129 lb 11.2 oz (58.8 kg) 130 lb 6.4 oz (59.1 kg)    Telemetry    Sinus rhythm.  No VT or other significant arrhythmia, personally reviewed.    Physical Exam   GEN: A&Ox4, well appearing, No acute distress.   Neck: No JVD Cardiac: RRR, no murmurs Respiratory: Clear to auscultation bilaterally. GI: Soft, nontender, non-distended positive BS MS: No edema; No deformity. Neuro:  grossly intact Psych:  Normal affect   Labs    Chemistry  Recent Labs Lab 04/29/17 1530 04/30/17 0338 05/03/17 0502  NA 128* 133* 130*  K 3.5 4.2 4.0  CL 92* 96* 93*  CO2 29 28 30   GLUCOSE 128* 85 89  BUN 11 9 16   CREATININE 0.72 0.74 0.82  CALCIUM 8.5* 8.8* 8.6*  PROT 6.0*  --   --   ALBUMIN 3.5  --   --   AST 24  --   --   ALT 16  --   --   ALKPHOS 57  --   --   BILITOT 0.2*  --   --   GFRNONAA >60 >60 >60  GFRAA >60 >60 >60  ANIONGAP 7 9 7      Hematology  Recent Labs Lab 04/29/17 1530 04/30/17 0338  WBC 3.3* 3.0*  RBC 3.88 4.15  HGB 12.3 12.7  HCT 35.1* 37.6  MCV 90.5 90.6  MCH 31.7 30.6  MCHC 35.0 33.8  RDW 13.2 12.9  PLT 123* 131*    Cardiac Enzymes  Recent Labs Lab 04/29/17 1530 04/29/17 2003 04/30/17 0338  TROPONINI <0.03 <0.03 <0.03   No results for input(s): TROPIPOC in the last 168 hours.   BNPNo results for input(s): BNP, PROBNP in the last 168 hours.   DDimer No results for input(s): DDIMER in the last 168 hours.   Radiology    Mr Cardiac Morphology W Wo Contrast  Result Date: 05/03/2017 CLINICAL DATA:  Ventricular Tachycardia EXAM: CARDIAC MRI TECHNIQUE: The patient was scanned on a 1.5 Tesla GE magnet. A dedicated cardiac coil was used. Functional imaging was done using Fiesta sequences. 2,3, and 4 chamber views were done to assess for RWMA's. Modified Simpson's rule using a short axis stack was used to calculate an ejection fraction on a dedicated work Conservation officer, nature. The patient received Multihance. After 10 minutes inversion recovery sequences were used to assess for infiltration and scar tissue. CONTRAST:  Multihance FINDINGS: All 4 cardiac chambers were normal in size and function. There was no ASD/VSD. There was no pericardial effusion. The aortic root was normal RV size and function normal with no diverticum. IIR with fat sat sequences showed no evidence of RV dysplasia. There was a prominent epicardial fat pad. The LV was normal in size  and function. The quantitative EF was 75% (EDV 66 cc ESV 16 cc SV 49 cc). Delayed enhancement images with gadolinium showed no scar, infarct or infiltration. The aortic mitral and tricuspid valves appeared normal IMPRESSION: 1) Normal LV size and function EF 75% 2) Normal RV size and function no evidence of RV dysplasia 3) No delayed gadolinium enhancement in LV myocardium on inversion recovery sequences 4) Normal AV, MV and TV 5) Prominent epicardial fat pad Jenkins Rouge Electronically Signed   By: Jenkins Rouge M.D.   On: 05/03/2017 12:10    Cardiac Studies   Echo 05/02/2017: Study Conclusions  - Left ventricle: The cavity size was normal. Systolic function was   normal. The estimated ejection fraction was in the range of 60%   to 65%. Wall motion was normal; there were no regional wall   motion abnormalities. - Aortic valve: Trileaflet; mildly thickened, mildly calcified   leaflets. - Mitral valve: There was trivial regurgitation. - Right atrium: The atrium was mildly dilated. - Tricuspid valve: There was trivial regurgitation.   LHC 04/30/17: The left ventricular systolic function is normal. The left ventricular ejection fraction is greater than 65% by visual estimate.  LV end diastolic pressure is normal. 11 mmHg  Angiographically normal coronary arteries   Angiographic normal coronary arteries with normal LV function and EDP. Suspect electrical etiology for sustained VT.  Patient Profile     61 y.o. female with no significant PMH admitted for VT on ETT.   Assessment & Plan    1. VT:  -  sustained VT during ETT.   - LHC with normal coronaries this admission - echo shows normal LV function, no valvular disease, non evidence HCM  - EP note reviewed. Await cardiac MRI result. If MRI normal, plan to start flecainide. Will discuss specific plan with Dr Curt Bears.   Discussed plan with patient. Will review final plan with patient once MRI result available and discussed with EP  team.    Signed, Sherren Mocha, MD  05/03/2017, 12:14 PM     Addendum: Reviewed MRI result - normal study. Discussed with Dr Curt Bears. Will start flecainide 100 mg BID. Continue metoprolol 12.5 mg BID. Exercise EKG next week. FU Dr Curt Bears after her treadmill.  Burt Knack,  Legrand Como 05/03/2017 12:55 PM

## 2017-05-03 NOTE — Discharge Summary (Signed)
Discharge Summary    Patient ID: [name],  MRN: [id number], DOB/AGE: March 10, 1956 61 y.o.  Admit date: 04/29/2017 Discharge date: 05/03/2017  Primary Care Provider: Milagros Evener R Primary Cardiologist: Dr. Gwenlyn Found Dr. Ileana Ladd (EP)   Discharge Diagnoses    Principal Problem:   Sustained ventricular tachycardia ([place]) Active Problems:   Seizure disorder ([place])   Allergies No Known Allergies   History of Present Illness     [name] is a 61 y.o. female with a history of seizure disorder who was admitted from the office on 04/29/17 for VT during an ETT.  Patient reported he she has had an irregular heartbeat for years. She does have a history of seizures and has been noted to have bigeminy and PVCs during hospital evaluations for those issues. She was originally seen by Dr. Gwenlyn Found on 04/15/17 for evaluation of infrequent exertional CP and near syncope on 03/28/17 while she was singing in the choir. This was thought to be vasovagal. Her chest pain was felt to be atypical and she was set up for GXT. This was set up for 04/29/17. During phase III of exercise, she developed ventricular tachycardia. The treadmill was stopped and her VT resolved. She then had 1 mm of ST depression in the inferior leads concerning for possible ischemia. She was added onto Dr. Jacalyn Lefevre schedule for further evaluation. It was decided to admit her for further work up and testing.   Hospital Course     Consultants: none  1. VT: sustained VT during ETT on 04/29/17.   - LHC with normal coronaries this admission - Echo shows normal LV function, no valvular disease, non evidence HCM  - Cardiac MRI normal with no evidence of scar. Will start Flecainide 100mg  BID and Lopressor 12.5mg  BID. I have arranged for a GXT next week  5/15 and follow up with Dr. Curt Bears after that.   Seizure d/o: continue home meds  The patient has had an uncomplicated hospital course and is recovering well. The radial catheter site  is stable. She has been seen by Dr. Burt Knack today and deemed ready for discharge home. All follow-up appointments have been scheduled. Discharge medications are listed below.  _____________  Discharge Vitals Blood pressure (!) 118/50, pulse 63, temperature 97.8 F (36.6 C), temperature source Oral, resp. rate 16, height 5' 4.5" (1.638 m), weight 130 lb 6.4 oz (59.1 kg), SpO2 100 %.  Filed Weights   05/01/17 0453 05/02/17 0527 05/03/17 0600  Weight: 131 lb (59.4 kg) 129 lb 11.2 oz (58.8 kg) 130 lb 6.4 oz (59.1 kg)    Labs & Radiologic Studies     CBC No results for input(s): WBC, NEUTROABS, HGB, HCT, MCV, PLT in the last 72 hours. Basic Metabolic Panel  Recent Labs  05/03/17 0502  NA 130*  K 4.0  CL 93*  CO2 30  GLUCOSE 89  BUN 16  CREATININE 0.82  CALCIUM 8.6*  MG 1.7   Liver Function Tests No results for input(s): AST, ALT, ALKPHOS, BILITOT, PROT, ALBUMIN in the last 72 hours. No results for input(s): LIPASE, AMYLASE in the last 72 hours. Cardiac Enzymes No results for input(s): CKTOTAL, CKMB, CKMBINDEX, TROPONINI in the last 72 hours. BNP Invalid input(s): POCBNP D-Dimer No results for input(s): DDIMER in the last 72 hours. Hemoglobin A1C No results for input(s): HGBA1C in the last 72 hours. Fasting Lipid Panel No results for input(s): CHOL, HDL, LDLCALC, TRIG, CHOLHDL, LDLDIRECT in the last 72 hours. Thyroid Function  Tests No results for input(s): TSH, T4TOTAL, T3FREE, THYROIDAB in the last 72 hours.  Invalid input(s): FREET3  Mr Cardiac Morphology W Wo Contrast  Result Date: 05/03/2017 CLINICAL DATA:  Ventricular Tachycardia EXAM: CARDIAC MRI TECHNIQUE: The patient was scanned on a 1.5 Tesla GE magnet. A dedicated cardiac coil was used. Functional imaging was done using Fiesta sequences. 2,3, and 4 chamber views were done to assess for RWMA's. Modified Simpson's rule using a short axis stack was used to calculate an ejection fraction on a dedicated work Ecologist. The patient received Multihance. After 10 minutes inversion recovery sequences were used to assess for infiltration and scar tissue. CONTRAST:  Multihance FINDINGS: All 4 cardiac chambers were normal in size and function. There was no ASD/VSD. There was no pericardial effusion. The aortic root was normal RV size and function normal with no diverticum. IIR with fat sat sequences showed no evidence of RV dysplasia. There was a prominent epicardial fat pad. The LV was normal in size and function. The quantitative EF was 75% (EDV 66 cc ESV 16 cc SV 49 cc). Delayed enhancement images with gadolinium showed no scar, infarct or infiltration. The aortic mitral and tricuspid valves appeared normal IMPRESSION: 1) Normal LV size and function EF 75% 2) Normal RV size and function no evidence of RV dysplasia 3) No delayed gadolinium enhancement in LV myocardium on inversion recovery sequences 4) Normal AV, MV and TV 5) Prominent epicardial fat pad Jenkins Rouge Electronically Signed   By: Jenkins Rouge M.D.   On: 05/03/2017 12:10     Diagnostic Studies/Procedures    Echo 05/02/2017: Study Conclusions - Left ventricle: The cavity size was normal. Systolic function was normal. The estimated ejection fraction was in the range of 60% to 65%. Wall motion was normal; there were no regional wall motion abnormalities. - Aortic valve: Trileaflet; mildly thickened, mildly calcified leaflets. - Mitral valve: There was trivial regurgitation. - Right atrium: The atrium was mildly dilated. - Tricuspid valve: There was trivial regurgitation.  _____________   LHC 04/30/17: The left ventricular systolic function is normal. The left ventricular ejection fraction is greater than 65% by visual estimate.  LV end diastolic pressure is normal. 11 mmHg  Angiographically normal coronary arteries  Angiographic normal coronary arteries with normal LV function and EDP. Suspect electrical etiology  for sustained VT.  _____________   Cardiac MRI 05/01/17 IMPRESSION: 1) Normal LV size and function EF 75% 2) Normal RV size and function no evidence of RV dysplasia 3) No delayed gadolinium enhancement in LV myocardium on inversion recovery sequences 4) Normal AV, MV and TV 5) Prominent epicardial fat pad    Disposition   Pt is being discharged home today in good condition.  Follow-up Plans & Appointments    Follow-up Information    [facility]. Go on 05/11/2017.   Why:  @ 2pm. Please wear comfortable clothes and gym shoes. you will be walking on a treadmill.  Contact information: [place] [address] [phone]       Constance Haw, MD. Go on 06/08/2017.   Specialty:  Cardiology Why:  @ 8:45 am Contact information: [place] [address] [phone]        Eileen Stanford, PA-C. Go on 05/13/2017.   Specialties:  Cardiology, Radiology Why:  @ 9:30am  Contact information: [place] [address] [phone]  Discharge Medications     Medication List    STOP taking these medications   aspirin EC 325 MG tablet     TAKE these medications   calcium-vitamin D 250-125 MG-UNIT tablet Commonly known as:  OSCAL WITH D Take by mouth.   divalproex 500 MG 24 hr tablet Commonly known as:  DEPAKOTE ER Take 1,000 mg by mouth at bedtime.   flecainide 100 MG tablet Commonly known as:  TAMBOCOR Take 1 tablet (100 mg total) by mouth 2 (two) times daily.   metoprolol tartrate 25 MG tablet Commonly known as:  LOPRESSOR Take 0.5 tablets (12.5 mg total) by mouth 2 (two) times daily.   multivitamin with minerals Tabs tablet Take 1 tablet by mouth every morning.   naproxen sodium 220 MG tablet Commonly known as:  ANAPROX Take 220 mg by mouth every morning.   omeprazole 20 MG capsule Commonly  known as:  PRILOSEC Take 20 mg by mouth 2 (two) times daily.   ONFI 10 MG tablet Generic drug:  cloBAZam Take 10 mg by mouth 2 (two) times daily.   polyethylene glycol packet Commonly known as:  MIRALAX / GLYCOLAX Take 17 g by mouth daily.          Outstanding Labs/Studies   Exercise stress test  Duration of Discharge Encounter   Greater than 30 minutes including physician time.  Signed, Angelena Form PA-C 05/03/2017, 2:32 PM

## 2017-05-03 NOTE — Discharge Instructions (Signed)

## 2017-05-05 NOTE — Progress Notes (Signed)
She can just follow up with Dr. Curt Bears

## 2017-05-10 ENCOUNTER — Telehealth: Payer: Self-pay | Admitting: Cardiovascular Disease

## 2017-05-10 NOTE — Telephone Encounter (Signed)
Exercise Tolerance Test  Order: [id number]  Status:  Final result Visible to patient:  Yes (MyChart) Dx:  Atypical chest pain  Notes recorded by Therisa Doyne on 05/05/2017 at 7:52 AM EDT Dr. Gwenlyn Found, The pt is scheduled for another stress test for flecanide monitoring on 5/15 and a post hospital f/u on 5/17 with Angelena Form and then follows up with Dr. Curt Bears in June. Do you still want her to f/u with you? ------  Notes recorded by Lorretta Harp, MD on 04/30/2017 at 1:15 PM EDT Return office visit with me to discuss results at next available   Please advise.

## 2017-05-11 ENCOUNTER — Ambulatory Visit (INDEPENDENT_AMBULATORY_CARE_PROVIDER_SITE_OTHER): Payer: BC Managed Care – PPO

## 2017-05-11 DIAGNOSIS — Z79899 Other long term (current) drug therapy: Secondary | ICD-10-CM

## 2017-05-11 DIAGNOSIS — Z5181 Encounter for therapeutic drug level monitoring: Secondary | ICD-10-CM | POA: Diagnosis not present

## 2017-05-11 LAB — EXERCISE TOLERANCE TEST
CHL CUP RESTING HR STRESS: 58 {beats}/min
CSEPEW: 7 METS
CSEPPHR: 111 {beats}/min
Exercise duration (min): 6 min
Exercise duration (sec): 0 s
MPHR: 159 {beats}/min
Percent HR: 69 %
RPE: 16

## 2017-05-12 NOTE — Progress Notes (Addendum)
Cardiology Office Note    Date:  05/13/2017   ID:  [name], DOB 05/31/1956, MRN [id number]  PCP:  Emily Nip, MD  Cardiologist:  Dr. Curt House ( previously seen by Dr. Gwenlyn House and [name], but now House to have EP problem)  CC: post hospital follow up   History of Present Illness:  [name] is a 61 y.o. female with a history of seizure disorder and recently diagnosed ventricular tachycardia who presents to clinic for post hospital follow up.   Patient reported he she has had an irregular heartbeat for years. She does have a history of seizures and has been noted to have bigeminy and PVCs during hospital evaluations for those issues. She was originally seen by Dr. Gwenlyn House on 04/15/17 for evaluation of infrequent exertional CP and near syncope on 03/28/17 while she was singing in the choir. This was thought to be vasovagal. Her chest pain was felt to be atypical and she was set up for GXT. This was set up for 04/29/17. During phase III of exercise, she developed ventricular tachycardia. The treadmill was stopped and her VT resolved. She then had 1 mm of ST depression in the inferior leads concerning for possible ischemia. She was added onto Dr. Jacalyn House schedule for further evaluation. It was decided to admit her for further work up and testing.   Admitted to [facility] from 5/3-05/03/17. LHC showed normal coronaries. Echo showed normal LV function, no valvular disease, non evidence HCM. Cardiac MRI was normal with no evidence of scar. She was started on Flecainide 100mg  BID and Lopressor 12.5mg  BID and discharged home.   She underwent a GXT on Flecainide on 5/15 which was normal.   Today she presents to clinic for follow up.  She really feels like she can tell a difference in her heart rhythm. She used to feel palpitations like her heart was fluttering, but now that is gone. No more chest pain. She feels like she has had more dizziness since starting the Flecainide but no syncope. She gets  its mostly when doing house work. Not related to position change like standing from sitting. It only lasts a couple seconds. She has been feeling great and totally back to normal. She walks everyday and walked almost 4 miles yesterday and did well. No SOB. She has some constipation and miralax isn't helping.    Past Medical History:  Diagnosis Date  . Arthritis lower back/hands  . Brain injury ([place])   . Cancer [facility]) breast tumor removed  . Leg fracture   . Seizure disorder ([place])   . Seizures ([place])   . VT (ventricular tachycardia) ([place]) 04/29/2017    Past Surgical History:  Procedure Laterality Date  . BREAST SURGERY    . FACIAL FRACTURE SURGERY  orbital bone repair  . LEFT HEART CATH AND CORONARY ANGIOGRAPHY N/A 04/30/2017   Procedure: Left Heart Cath and Coronary Angiography;  Surgeon: Leonie Man, MD;  Location: [place] [facility];  Service: Cardiovascular;  Laterality: N/A;    Current Medications: Outpatient Medications Prior to Visit  Medication Sig Dispense Refill  . calcium-vitamin D (OSCAL WITH D) 250-125 MG-UNIT tablet Take by mouth as directed.     . cloBAZam (ONFI) 10 MG tablet Take 10 mg by mouth 2 (two) times daily.    . divalproex (DEPAKOTE ER) 500 MG 24 hr tablet Take 1,000 mg by mouth at bedtime.     . flecainide (TAMBOCOR) 100 MG tablet Take 1 tablet (100 mg  total) by mouth 2 (two) times daily. 60 tablet 6  . metoprolol tartrate (LOPRESSOR) 25 MG tablet Take 0.5 tablets (12.5 mg total) by mouth 2 (two) times daily. 30 tablet 6  . Multiple Vitamin (MULITIVITAMIN WITH MINERALS) TABS Take 1 tablet by mouth every morning.     . naproxen sodium (ANAPROX) 220 MG tablet Take 220 mg by mouth daily as needed (pain).     Marland Kitchen omeprazole (PRILOSEC) 20 MG capsule Take 20 mg by mouth 2 (two) times daily.    . polyethylene glycol (MIRALAX / GLYCOLAX) packet Take 17 g by mouth daily.     No facility-administered medications prior to visit.      Allergies:   Patient has no known  allergies.   Social History   Social History  . Marital status: Married    Spouse name: N/A  . Number of children: N/A  . Years of education: N/A   Social History Main Topics  . Smoking status: Never Smoker  . Smokeless tobacco: Never Used  . Alcohol use No  . Drug use: No  . Sexual activity: Not on file   Other Topics Concern  . Not on file   Social History Narrative  . No narrative on file     Family History:  The patient's family history includes Heart failure in her mother.     ROS:   Please see the history of present illness.    ROS All other systems reviewed and are negative.   PHYSICAL EXAM:   VS:  BP 100/66   Pulse (!) 58   Ht 5' 4.5" (1.638 m)   Wt 134 lb 6.4 oz (61 kg)   SpO2 98%   BMI 22.71 kg/m    GEN: Well nourished, well developed, in no acute distress  HEENT: normal  Neck: no JVD, carotid bruits, or masses Cardiac: RRR; no murmurs, rubs, or gallops,no edema  Respiratory:  clear to auscultation bilaterally, normal work of breathing GI: soft, nontender, nondistended, + BS MS: no deformity or atrophy  Skin: warm and dry, no rash Neuro:  Alert and Oriented x 3, Strength and sensation are intact Psych: euthymic mood, full affect   Wt Readings from Last 3 Encounters:  05/13/17 134 lb 6.4 oz (61 kg)  05/03/17 130 lb 6.4 oz (59.1 kg)  04/29/17 133 lb (60.3 kg)      Studies/Labs Reviewed:   EKG:  EKG is NOT ordered today.   Recent Labs: 04/29/2017: ALT 16; TSH 2.531 04/30/2017: Hemoglobin 12.7; Platelets 131 05/03/2017: BUN 16; Creatinine, Ser 0.82; Magnesium 1.7; Potassium 4.0; Sodium 130   Lipid Panel    Component Value Date/Time   CHOL 188 04/30/2017 0338   TRIG 24 04/30/2017 0338   HDL 106 04/30/2017 0338   CHOLHDL 1.8 04/30/2017 0338   VLDL 5 04/30/2017 0338   LDLCALC 77 04/30/2017 0338    Additional studies/ records that were reviewed today include:  Echo 05/02/2017: Study Conclusions - Left ventricle: The cavity size was normal.  Systolic function was normal. The estimated ejection fraction was in the range of 60% to 65%. Wall motion was normal; there were no regional wall motion abnormalities. - Aortic valve: Trileaflet; mildly thickened, mildly calcified leaflets. - Mitral valve: There was trivial regurgitation. - Right atrium: The atrium was mildly dilated. - Tricuspid valve: There was trivial regurgitation.  _____________   LHC 04/30/17: The left ventricular systolic function is normal. The left ventricular ejection fraction is greater than 65% by visual estimate.  LV end diastolic pressure is normal. 11 mmHg  Angiographically normal coronary arteries  Angiographic normal coronary arteries with normal LV function and EDP. Suspect electrical etiology for sustained VT.  _____________   Cardiac MRI 05/01/17 IMPRESSION: 1) Normal LV size and function EF 75% 2) Normal RV size and function no evidence of RV dysplasia 3) No delayed gadolinium enhancement in LV myocardium on inversion recovery sequences 4) Normal AV, MV and TV 5) Prominent epicardial fat pad  _____________  05/11/17 GXT Study Highlights    Blood pressure demonstrated a normal response to exercise.  No T wave inversion was noted during stress.  There was no ST segment deviation noted during stress.  The patient requested the test to be stopped.  Heart rate demonstrated pharmacologically blunted response to exercise  Overall, the patient's exercise capacity was normal.  Duke Treadmill Score: low risk   Negative stress test without evidence of ischemia at given workload.      ASSESSMENT & PLAN:   Ventricular tachycardia: well controlled on Flecainide 100mg  BID and Lopressor 12.5mg  BID.   Dizziness: she has had some mild dizziness since starting Flecainide that sounds benign. Does not sound orthostatic. No syncope. Will continue to monitor  Seizure disorder: this has been stable on Depakote and  Onfi   Medication Adjustments/Labs and Tests Ordered: Current medicines are reviewed at length with the patient today.  Concerns regarding medicines are outlined above.  Medication changes, Labs and Tests ordered today are listed in the Patient Instructions below. Patient Instructions  Medication Instructions:  Your physician recommends that you continue on your current medications as directed. Please refer to the Current Medication list given to you today.   Labwork: None ordered  Testing/Procedures: None ordered  Follow-Up: Your physician recommends that you schedule a follow-up appointment in: FOLLOW-UP AS PLANNED  Any Other Special Instructions Will Be Listed Below (If Applicable).     If you need a refill on your cardiac medications before your next appointment, please call your pharmacy.      Signed, Angelena Form, PA-C  05/13/2017 10:04 AM    [facility] [place], [place], [place]  [address] Phone: [phone]; Fax: [phone]

## 2017-05-13 ENCOUNTER — Ambulatory Visit (INDEPENDENT_AMBULATORY_CARE_PROVIDER_SITE_OTHER): Payer: BC Managed Care – PPO | Admitting: Physician Assistant

## 2017-05-13 VITALS — BP 100/66 | HR 58 | Ht 64.5 in | Wt 134.4 lb

## 2017-05-13 DIAGNOSIS — R42 Dizziness and giddiness: Secondary | ICD-10-CM

## 2017-05-13 DIAGNOSIS — I472 Ventricular tachycardia, unspecified: Secondary | ICD-10-CM

## 2017-05-13 DIAGNOSIS — G40909 Epilepsy, unspecified, not intractable, without status epilepticus: Secondary | ICD-10-CM | POA: Diagnosis not present

## 2017-05-13 NOTE — Patient Instructions (Addendum)
Medication Instructions:  Your physician recommends that you continue on your current medications as directed. Please refer to the Current Medication list given to you today.   Labwork: None ordered  Testing/Procedures: None ordered  Follow-Up: Your physician recommends that you schedule a follow-up appointment in: FOLLOW-UP AS PLANNED  Any Other Special Instructions Will Be Listed Below (If Applicable).     If you need a refill on your cardiac medications before your next appointment, please call your pharmacy.

## 2017-05-16 NOTE — Telephone Encounter (Signed)
F/U with Dr Curt Bears is fine with me

## 2017-05-17 NOTE — Telephone Encounter (Signed)
CAMNITZ 06-08-17 @84AM 

## 2017-05-26 ENCOUNTER — Encounter: Payer: Self-pay | Admitting: Cardiology

## 2017-06-08 ENCOUNTER — Encounter: Payer: Self-pay | Admitting: Cardiology

## 2017-06-08 ENCOUNTER — Ambulatory Visit (INDEPENDENT_AMBULATORY_CARE_PROVIDER_SITE_OTHER): Payer: BC Managed Care – PPO | Admitting: Cardiology

## 2017-06-08 VITALS — BP 98/62 | HR 58 | Ht 64.5 in | Wt 135.0 lb

## 2017-06-08 DIAGNOSIS — I472 Ventricular tachycardia, unspecified: Secondary | ICD-10-CM

## 2017-06-08 MED ORDER — DILTIAZEM HCL ER COATED BEADS 120 MG PO CP24: 120.0000 mg | ORAL_CAPSULE | Freq: Every day | ORAL | 3 refills | Status: DC

## 2017-06-08 NOTE — Progress Notes (Signed)
Electrophysiology Office Note   Date:  06/08/2017   ID:  Emily, House March 24, 1956, MRN [id number]  PCP:  Aretta Nip, MD  Cardiologist:  Stanford Breed Primary Electrophysiologist:  Will Meredith Leeds, MD    Chief Complaint  Patient presents with  . Follow-up    ventricular tachycardia      History of Present Illness: [name] is a 61 y.o. female who is being seen today for the evaluation of VT at the request of Emily, [name], MD. Presenting today for electrophysiology evaluation. In the hospital on 04/30/17 after an exercise treadmill test resulting in ventricular tachycardia. She does have a history of seizures with bigeminy and PVCs. She gets occasional palpitations that last between 5 and 10 minutes. During stage III of her exercise treadmill test, she went into a sustained episode of ventricular tachycardia. The treadmill was stopped and her VT converted to sinus rhythm. VT was associated with shortness of breath. Had a cath that showed no evidence of coronary disease and a cardiac MRI that showed no evidence of scar.    Today, she denies symptoms of palpitations, chest pain, shortness of breath, orthopnea, PND, lower extremity edema, claudication, dizziness, presyncope, syncope, bleeding, or neurologic sequela. The patient is tolerating medications without difficulties.  She does have episodes of fatigue. She says that she falls asleep when she sits down most the time. She has been taking naps in the morning, which is unusual for her. This started after she was discharged from the hospital.   Past Medical History:  Diagnosis Date  . Arthritis lower back/hands  . 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 Cancer [facility]) breast tumor removed  . Leg fracture   . Seizure disorder ([place])   . Seizures ([place])   . VT (ventricular tachycardia) ([place]) 04/29/2017   Past Surgical History:  Procedure Laterality Date  . BREAST SURGERY    . FACIAL FRACTURE SURGERY  orbital bone repair  . LEFT  HEART CATH AND CORONARY ANGIOGRAPHY N/A 04/30/2017   Procedure: Left Heart Cath and Coronary Angiography;  Surgeon: Leonie Man, MD;  Location: [place] [facility];  Service: Cardiovascular;  Laterality: N/A;     Current Outpatient Prescriptions  Medication Sig Dispense Refill  . calcium-vitamin D (OSCAL WITH D) 250-125 MG-UNIT tablet Take by mouth as directed.     . cloBAZam (ONFI) 10 MG tablet Take 10 mg by mouth 2 (two) times daily.    . divalproex (DEPAKOTE ER) 500 MG 24 hr tablet Take 1,000 mg by mouth at bedtime.     . flecainide (TAMBOCOR) 100 MG tablet Take 1 tablet (100 mg total) by mouth 2 (two) times daily. 60 tablet 6  . Multiple Vitamin (MULITIVITAMIN WITH MINERALS) TABS Take 1 tablet by mouth every morning.     . naproxen sodium (ANAPROX) 220 MG tablet Take 220 mg by mouth daily as needed (pain).     Marland Kitchen omeprazole (PRILOSEC) 20 MG capsule Take 20 mg by mouth 2 (two) times daily.    . polyethylene glycol (MIRALAX / GLYCOLAX) packet Take 17 g by mouth daily.    Marland Kitchen diltiazem (CARDIZEM CD) 120 MG 24 hr capsule Take 1 capsule (120 mg total) by mouth daily. 90 capsule 3   No current facility-administered medications for this visit.     Allergies:   Patient has no known allergies.   Social History:  The patient  reports that she has never smoked. She has never used smokeless tobacco. She reports  that she does not drink alcohol or use drugs.   Family History:  The patient's family history includes Heart failure in her mother.    ROS:  Please see the history of present illness.   Otherwise, review of systems is positive for fatigue, snoring.   All other systems are reviewed and negative.    PHYSICAL EXAM: VS:  BP 98/62   Pulse (!) 58   Ht 5' 4.5" (1.638 m)   Wt 135 lb (61.2 kg)   BMI 22.81 kg/m  , BMI Body mass index is 22.81 kg/m. GEN: Well nourished, well developed, in no acute distress  HEENT: normal  Neck: no JVD, carotid bruits, or masses Cardiac: RRR; no murmurs,  rubs, or gallops,no edema  Respiratory:  clear to auscultation bilaterally, normal work of breathing GI: soft, nontender, nondistended, + BS MS: no deformity or atrophy  Skin: warm and dry Neuro:  Strength and sensation are intact Psych: euthymic mood, full affect  EKG:  EKG is ordered today. Personal review of the ekg ordered shows sinus rhythm, 1 degree AV block, rate 58  Recent Labs: 04/29/2017: ALT 16; TSH 2.531 04/30/2017: Hemoglobin 12.7; Platelets 131 05/03/2017: BUN 16; Creatinine, Ser 0.82; Magnesium 1.7; Potassium 4.0; Sodium 130    Lipid Panel     Component Value Date/Time   CHOL 188 04/30/2017 0338   TRIG 24 04/30/2017 0338   HDL 106 04/30/2017 0338   CHOLHDL 1.8 04/30/2017 0338   VLDL 5 04/30/2017 0338   LDLCALC 77 04/30/2017 0338     Wt Readings from Last 3 Encounters:  06/08/17 135 lb (61.2 kg)  05/13/17 134 lb 6.4 oz (61 kg)  05/03/17 130 lb 6.4 oz (59.1 kg)      Other studies Reviewed: Additional studies/ records that were reviewed today include: TTE 05/02/17, Cath 04/30/17, ETT 05/11/17  Review of the above records today demonstrates:  - Left ventricle: The cavity size was normal. Systolic function was   normal. The estimated ejection fraction was in the range of 60%   to 65%. Wall motion was normal; there were no regional wall   motion abnormalities. - Aortic valve: Trileaflet; mildly thickened, mildly calcified   leaflets. - Mitral valve: There was trivial regurgitation. - Right atrium: The atrium was mildly dilated. - Tricuspid valve: There was trivial regurgitation.    The left ventricular systolic function is normal. The left ventricular ejection fraction is greater than 65% by visual estimate.  LV end diastolic pressure is normal. 11 mmHg  Angiographically normal coronary arteries   Angiographic normal coronary arteries with normal LV function and EDP.   Blood pressure demonstrated a normal response to exercise.  No T wave inversion was noted  during stress.  There was no ST segment deviation noted during stress.  The patient requested the test to be stopped.  Heart rate demonstrated pharmacologically blunted response to exercise  Overall, the patient's exercise capacity was normal.  Duke Treadmill Score: low risk   CMRI 05/03/17 1) Normal LV size and function EF 75%  2) Normal RV size and function no evidence of RV dysplasia  3) No delayed gadolinium enhancement in LV myocardium on inversion recovery sequences  4) Normal AV, MV and TV  5) Prominent epicardial fat pad  ASSESSMENT AND PLAN:  1.  Ventricular tachycardia: Had left heart catheterization, as well as cardiac MRI that showed no evidence of coronary disease or scar. Was placed on flecainide while in the hospital and tolerated that well. She is not  had any further arrhythmias, and is said that she feels better than she has in years. We'll continue her flecainide at this time.  2. Fatigue: Has occurred since starting her medications. We will stop her metoprolol and start her on diltiazem.    Current medicines are reviewed at length with the patient today.   The patient does not have concerns regarding her medicines.  The following changes were made today:  none  Labs/ tests ordered today include:  Orders Placed This Encounter  Procedures  . EKG 12-Lead     Disposition:   FU with Will Camnitz 6 months  Signed, Will Meredith Leeds, MD  06/08/2017 9:15 AM     [facility] [address] [place] [address] [phone] (office) [phone] (fax)

## 2017-06-08 NOTE — Patient Instructions (Addendum)
Medication Instructions:    Your physician has recommended you make the following change in your medication: 1) STOP Metoprolol (Lopressor) 2) START Diltiazem 120 mg daily  - If you need a refill on your cardiac medications before your next appointment, please call your pharmacy.   Labwork:  None ordered  Testing/Procedures:  None ordered  Follow-Up:  Your physician wants you to follow-up in: 6 months with Dr. Curt Bears.  You will receive a reminder letter in the mail two months in advance. If you don't receive a letter, please call our office to schedule the follow-up appointment.  Thank you for choosing [facility]!!   Trinidad Curet, RN [phone]

## 2017-07-12 ENCOUNTER — Ambulatory Visit: Payer: BC Managed Care – PPO | Admitting: Cardiology

## 2017-11-14 ENCOUNTER — Other Ambulatory Visit: Payer: Self-pay | Admitting: Physician Assistant

## 2017-12-20 ENCOUNTER — Encounter: Payer: Self-pay | Admitting: Cardiology

## 2017-12-20 ENCOUNTER — Ambulatory Visit (INDEPENDENT_AMBULATORY_CARE_PROVIDER_SITE_OTHER): Payer: BC Managed Care – PPO | Admitting: Cardiology

## 2017-12-20 VITALS — BP 84/60 | HR 65 | Ht 64.0 in | Wt 135.0 lb

## 2017-12-20 DIAGNOSIS — I493 Ventricular premature depolarization: Secondary | ICD-10-CM | POA: Diagnosis not present

## 2017-12-20 MED ORDER — PROPAFENONE HCL ER 225 MG PO CP12: 225.0000 mg | ORAL_CAPSULE | Freq: Two times a day (BID) | ORAL | 3 refills | Status: DC

## 2017-12-20 NOTE — Progress Notes (Signed)
Electrophysiology Office Note   Date:  12/20/2017   ID:  Emslee, Lopezmartinez [birth year], MRN [id number]  PCP:  Aretta Nip, MD  Cardiologist:  Stanford Breed Primary Electrophysiologist:  Adeline Petitfrere Meredith Leeds, MD    Chief Complaint  Patient presents with  . Palpitations     History of Present Illness: [name] is a 61 y.o. female who is being seen today for the evaluation of VT at the request of [name], [name], MD. Presenting today for electrophysiology evaluation. In the hospital on 04/30/17 after an exercise treadmill test resulting in ventricular tachycardia. She does have a history of seizures with bigeminy and PVCs. She gets occasional palpitations that last between 5 and 10 minutes. During stage III of her exercise treadmill test, she went into a sustained episode of ventricular tachycardia. The treadmill was stopped and her VT converted to sinus rhythm. VT was associated with shortness of breath. Had a cath that showed no evidence of coronary disease and a cardiac MRI that showed no evidence of scar.  Today, denies symptoms of chest pain, shortness of breath, orthopnea, PND, lower extremity edema, claudication, dizziness, presyncope, syncope, bleeding, or neurologic sequela. The patient is tolerating medications without difficulties.  Yesterday, she had palpitations and dizziness.  This occurred as she was getting out of the shower.  She had to sit down and put her head between her legs to feel better.  She laid down and the episode lasted approximately 10 minutes.  Afterwards she had a headache and mild dizziness as well.  She did feel palpitations during the episode.  Had any episodes other than this 1 since starting her flecainide.   Past Medical History:  Diagnosis Date  . Arthritis lower back/hands  . Brain injury ([place])   . Cancer [facility]) breast tumor removed  . Leg fracture   . Seizure disorder ([place])   . Seizures ([place])   . VT (ventricular tachycardia) ([place])  04/29/2017   Past Surgical History:  Procedure Laterality Date  . BREAST SURGERY    . FACIAL FRACTURE SURGERY  orbital bone repair  . LEFT HEART CATH AND CORONARY ANGIOGRAPHY N/A 04/30/2017   Procedure: Left Heart Cath and Coronary Angiography;  Surgeon: Leonie Man, MD;  Location: [place] [facility];  Service: Cardiovascular;  Laterality: N/A;     Current Outpatient Medications  Medication Sig Dispense Refill  . acetaminophen (TYLENOL) 500 MG tablet Take 500 mg by mouth as needed for mild pain or fever.    . calcium-vitamin D (OSCAL WITH D) 250-125 MG-UNIT tablet Take by mouth as directed.     . cloBAZam (ONFI) 10 MG tablet Take 10 mg by mouth 2 (two) times daily.    . divalproex (DEPAKOTE ER) 500 MG 24 hr tablet Take 1,000 mg by mouth at bedtime.     . flecainide (TAMBOCOR) 100 MG tablet TAKE 1 TABLET BY MOUTH TWICE A DAY 60 tablet 7  . Multiple Vitamin (MULITIVITAMIN WITH MINERALS) TABS Take 1 tablet by mouth every morning.     Marland Kitchen omeprazole (PRILOSEC) 20 MG capsule Take 20 mg by mouth 2 (two) times daily.    . polyethylene glycol (MIRALAX / GLYCOLAX) packet Take 17 g by mouth daily.    Marland Kitchen diltiazem (CARDIZEM CD) 120 MG 24 hr capsule Take 1 capsule (120 mg total) by mouth daily. 90 capsule 3  . propafenone (RYTHMOL SR) 225 MG 12 hr capsule Take 1 capsule (225 mg total) by mouth 2 (two) times daily.  60 capsule 3   No current facility-administered medications for this visit.     Allergies:   Patient has no known allergies.   Social History:  The patient  reports that  has never smoked. she has never used smokeless tobacco. She reports that she does not drink alcohol or use drugs.   Family History:  The patient's family history includes Heart failure in her mother.    ROS:  Please see the history of present illness.   Otherwise, review of systems is positive for sweats, leg pain.   All other systems are reviewed and negative.   PHYSICAL EXAM: VS:  BP (!) 84/60   Pulse 65   Ht  5\' 4"  (1.626 m)   Wt 135 lb (61.2 kg)   SpO2 98%   BMI 23.17 kg/m  , BMI Body mass index is 23.17 kg/m. GEN: Well nourished, well developed, in no acute distress  HEENT: normal  Neck: no JVD, carotid bruits, or masses Cardiac: RRR; no murmurs, rubs, or gallops,no edema  Respiratory:  clear to auscultation bilaterally, normal work of breathing GI: soft, nontender, nondistended, + BS MS: no deformity or atrophy  Skin: warm and dry Neuro:  Strength and sensation are intact Psych: euthymic mood, full affect  EKG:  EKG is ordered today. Personal review of the ekg ordered shows SR, 1 degree AV block, iRBBB, rate 65   Recent Labs: 04/29/2017: ALT 16; TSH 2.531 04/30/2017: Hemoglobin 12.7; Platelets 131 05/03/2017: BUN 16; Creatinine, Ser 0.82; Magnesium 1.7; Potassium 4.0; Sodium 130    Lipid Panel     Component Value Date/Time   CHOL 188 04/30/2017 0338   TRIG 24 04/30/2017 0338   HDL 106 04/30/2017 0338   CHOLHDL 1.8 04/30/2017 0338   VLDL 5 04/30/2017 0338   LDLCALC 77 04/30/2017 0338     Wt Readings from Last 3 Encounters:  12/20/17 135 lb (61.2 kg)  06/08/17 135 lb (61.2 kg)  05/13/17 134 lb 6.4 oz (61 kg)      Other studies Reviewed: Additional studies/ records that were reviewed today include: TTE 05/02/17, Cath 04/30/17, ETT 05/11/17  Review of the above records today demonstrates:  - Left ventricle: The cavity size was normal. Systolic function was   normal. The estimated ejection fraction was in the range of 60%   to 65%. Wall motion was normal; there were no regional wall   motion abnormalities. - Aortic valve: Trileaflet; mildly thickened, mildly calcified   leaflets. - Mitral valve: There was trivial regurgitation. - Right atrium: The atrium was mildly dilated. - Tricuspid valve: There was trivial regurgitation.    The left ventricular systolic function is normal. The left ventricular ejection fraction is greater than 65% by visual estimate.  LV end diastolic  pressure is normal. 11 mmHg  Angiographically normal coronary arteries   Angiographic normal coronary arteries with normal LV function and EDP.   Blood pressure demonstrated a normal response to exercise.  No T wave inversion was noted during stress.  There was no ST segment deviation noted during stress.  The patient requested the test to be stopped.  Heart rate demonstrated pharmacologically blunted response to exercise  Overall, the patient's exercise capacity was normal.  Duke Treadmill Score: low risk   CMRI 05/03/17 1) Normal LV size and function EF 75%  2) Normal RV size and function no evidence of RV dysplasia  3) No delayed gadolinium enhancement in LV myocardium on inversion recovery sequences  4) Normal AV, MV and  TV  5) Prominent epicardial fat pad  ASSESSMENT AND PLAN:  1.  Ventricular tachycardia: Had left heart cath and cardiac MRI that showed no evidence of coronary disease or scar.  Was placed on flecainide but is continued to have issues with palpitations and dizziness.  We Cahlil Sattar plan to switch her to propafenone today.  She does not tolerate this or has more episodes, she may benefit from either mexiletine or catheter ablation.  2. Fatigue:  is feeling much better since stopping metoprolol and starting diltiazem.    Current medicines are reviewed at length with the patient today.   The patient does not have concerns regarding her medicines.  The following changes were made today:  stop flecainide, start propafenone  Labs/ tests ordered today include:  Orders Placed This Encounter  Procedures  . EKG 12-Lead     Disposition:   FU with Arta Stump 3 months  Signed, Mayrani Khamis Meredith Leeds, MD  12/20/2017 9:24 AM     [facility] [address] [place] [address] [phone] (office) [phone] (fax)

## 2017-12-20 NOTE — Patient Instructions (Addendum)
Medication Instructions:  Your physician has recommended you make the following change in your medication:  1. STOP Flecainide 2. START Propafenone 225 mg twice a day -- START THIS MEDICATION THE EVENING of 12/22/2017  * If you need a refill on your cardiac medications before your next appointment, please call your pharmacy. *  Labwork: None ordered  Testing/Procedures: None ordered  Follow-Up: Your physician recommends that you schedule a follow-up appointment in: 3 months with Dr. Curt Bears.  Thank you for choosing [facility]!!   Trinidad Curet, RN [phone]  Any Other Special Instructions Will Be Listed Below (If Applicable).  Propafenone tablets What is this medicine? PROPAFENONE (proe pa FEEN one) is an antiarrhythmic agent. It is used to treat irregular heart rhythm and can slow rapid heartbeats. This medicine can help your heart to return to and maintain a normal rhythm. This medicine may be used for other purposes; ask your health care provider or pharmacist if you have questions. COMMON BRAND NAME(S): Rythmol What should I tell my health care provider before I take this medicine? They need to know if you have any of these conditions: -heart disease -high blood levels of potassium -kidney disease -liver disease -low blood pressure -lung disease like asthma, chronic bronchitis or emphysema -pacemaker -slow heart rate -an unusual or allergic reaction to propafenone, other medicines, foods, dyes, or preservatives -pregnant or trying to get pregnant -breast-feeding How should I use this medicine? Take this medicine by mouth with a glass of water. Follow the directions on the prescription label. You can take this medicine with or without food. Take your doses at regular intervals. Do not take your medicine more often than directed. Do not stop taking except on the advice of your doctor or health care professional. Talk to your pediatrician regarding the use of this  medicine in children. Special care may be needed. Overdosage: If you think you have taken too much of this medicine contact a poison control center or emergency room at once. NOTE: This medicine is only for you. Do not share this medicine with others. What if I miss a dose? If you miss a dose, take it as soon as you can. If it is almost time for your next dose, take only that dose. Do not take double or extra doses. What may interact with this medicine? Do not take this medicine with any of the following medications: -arsenic trioxide -certain antibiotics like clarithromycin, erythromycin, grepafloxacin, pentamidine, sparfloxacin, troleandomycin -certain medicines for depression or mental illness like amoxapine, haloperidol, maprotiline, pimozide, sertindole, thioridazine, tricyclic antidepressants, ziprasidone -certain medicines for fungal infections like fluconazole, itraconazole, ketoconazole, posaconazole, voriconazole -certain medicines for irregular heart beat like dofetilide, dronedarone -certain medicines for malaria like chloroquine, halofantrine -cisapride -droperidol -levomethadyl -ranolazine -ritonavir This medicine may also interact with the following medications: -certain medicines for angina or blood pressure -certain medicines for asthma or breathing difficulties like formoterol, salmeterol -certain medicines that treat or prevent blood clots like warfarin -cimetidine -cyclosporine -digoxin -diuretics -local anesthetics -other medicines that prolong the QT interval (cause an abnormal heart rhythm) -rifampin -theophylline This list may not describe all possible interactions. Give your health care provider a list of all the medicines, herbs, non-prescription drugs, or dietary supplements you use. Also tell them if you smoke, drink alcohol, or use illegal drugs. Some items may interact with your medicine. What should I watch for while using this medicine? Your condition  will be monitored closely when you first begin therapy. Often, this drug is first  started in a hospital or other monitored health care setting. Once you are on maintenance therapy, visit your doctor or health care professional for regular checks on your progress. Because your condition and use of this medicine carry some risk, it is a good idea to carry an identification card, necklace or bracelet with details of your condition, medications, and doctor or health care professional. Dennis Bast may get drowsy or dizzy. Do not drive, use machinery, or do anything that needs mental alertness until you know how this medicine affects you. Do not stand or sit up quickly, especially if you are an older patient. This reduces the risk of dizzy or fainting spells. If you are going to have surgery, tell your doctor or health care professional that you are taking this medicine. What side effects may I notice from receiving this medicine? Side effects that you should report to your doctor or health care professional as soon as possible: -chest pain, palpitations -fever or chills -shortness of breath -swelling of feet or legs -trembling or shaking Side effects that usually do not require medical attention (report to your doctor or health care professional if they continue or are bothersome): -blurred vision -changes in taste (a metallic or bitter taste) -constipation or diarrhea -dry mouth -headache -nausea or vomiting -tiredness or weakness This list may not describe all possible side effects. Call your doctor for medical advice about side effects. You may report side effects to FDA at 1-800-FDA-1088. Where should I keep my medicine? Keep out of the reach of children. Store at room temperature between 15 and 30 degrees C (59 and 86 degrees F). Protect from light. Keep container tightly closed. Throw away any unused medicine after the expiration date. NOTE: This sheet is a summary. It may not cover all possible  information. If you have questions about this medicine, talk to your doctor, pharmacist, or health care provider.  2018 Elsevier/Gold Standard (2013-07-10 13:27:06)      r

## 2018-03-11 ENCOUNTER — Encounter: Payer: Self-pay | Admitting: *Deleted

## 2018-03-22 ENCOUNTER — Ambulatory Visit (INDEPENDENT_AMBULATORY_CARE_PROVIDER_SITE_OTHER): Payer: BC Managed Care – PPO | Admitting: Cardiology

## 2018-03-22 ENCOUNTER — Encounter: Payer: Self-pay | Admitting: Cardiology

## 2018-03-22 VITALS — BP 102/70 | HR 66 | Ht 64.5 in | Wt 135.0 lb

## 2018-03-22 DIAGNOSIS — I472 Ventricular tachycardia, unspecified: Secondary | ICD-10-CM

## 2018-03-22 DIAGNOSIS — R5383 Other fatigue: Secondary | ICD-10-CM | POA: Diagnosis not present

## 2018-03-22 NOTE — Progress Notes (Signed)
Electrophysiology Office Note   Date:  03/22/2018   ID:  Emily, House 1956/03/26, MRN [id number]  PCP:  Aretta Nip, MD  Cardiologist:  Stanford Breed Primary Electrophysiologist:  Jerrika Ledlow Meredith Leeds, MD    Chief Complaint  Patient presents with  . Follow-up    PVC's     History of Present Illness: [name] is a 62 y.o. female who is being seen today for the evaluation of VT at the request of [name], [name], MD. Presenting today for electrophysiology evaluation. In the hospital on 04/30/17 after an exercise treadmill test resulting in ventricular tachycardia. She does have a history of seizures with bigeminy and PVCs. She gets occasional palpitations that last between 5 and 10 minutes. During stage III of her exercise treadmill test, she went into a sustained episode of ventricular tachycardia. The treadmill was stopped and her VT converted to sinus rhythm. VT was associated with shortness of breath. Had a cath that showed no evidence of coronary disease and a cardiac MRI that showed no evidence of scar.  Today, denies symptoms of palpitations, chest pain, shortness of breath, orthopnea, PND, lower extremity edema, claudication, dizziness, presyncope, syncope, bleeding, or neurologic sequela. The patient is tolerating medications without difficulties.  Overall she is feeling well.  She does continue to have episodes of fatigue that she attributes to her seizure medications.   Past Medical History:  Diagnosis Date  . Arthritis lower back/hands  . Brain injury ([place])   . Cancer [facility]) breast tumor removed  . Leg fracture   . Seizure disorder ([place])   . Seizures ([place])   . VT (ventricular tachycardia) ([place]) 04/29/2017   Past Surgical History:  Procedure Laterality Date  . BREAST SURGERY    . FACIAL FRACTURE SURGERY  orbital bone repair  . LEFT HEART CATH AND CORONARY ANGIOGRAPHY N/A 04/30/2017   Procedure: Left Heart Cath and Coronary Angiography;  Surgeon: Leonie Man, MD;  Location: [place] [facility];  Service: Cardiovascular;  Laterality: N/A;     Current Outpatient Medications  Medication Sig Dispense Refill  . acetaminophen (TYLENOL) 500 MG tablet Take 500 mg by mouth as needed for mild pain or fever.    . calcium-vitamin D (OSCAL WITH D) 250-125 MG-UNIT tablet Take by mouth as directed.     . cloBAZam (ONFI) 10 MG tablet Take 1 tablet (10 mg) in the mornings and one and a half tablets (15 mg) at bedtime by moth daily.    Marland Kitchen diltiazem (CARDIZEM CD) 120 MG 24 hr capsule Take 1 capsule (120 mg total) by mouth daily. 90 capsule 3  . divalproex (DEPAKOTE ER) 500 MG 24 hr tablet Take 1,000 mg by mouth at bedtime.     . Multiple Vitamin (MULITIVITAMIN WITH MINERALS) TABS Take 1 tablet by mouth every morning.     Marland Kitchen omeprazole (PRILOSEC) 20 MG capsule Take 20 mg by mouth 2 (two) times daily.    . polyethylene glycol (MIRALAX / GLYCOLAX) packet Take 17 g by mouth daily.    . propafenone (RYTHMOL SR) 225 MG 12 hr capsule Take 1 capsule (225 mg total) by mouth 2 (two) times daily. 60 capsule 3   No current facility-administered medications for this visit.     Allergies:   Patient has no known allergies.   Social History:  The patient  reports that she has never smoked. She has never used smokeless tobacco. She reports that she does not drink alcohol or use drugs.  Family History:  The patient's family history includes Heart failure in her mother.   ROS:  Please see the history of present illness.   Otherwise, review of systems is positive for none.   All other systems are reviewed and negative.   PHYSICAL EXAM: VS:  BP 102/70   Pulse 66   Ht 5' 4.5" (1.638 m)   Wt 135 lb (61.2 kg)   BMI 22.81 kg/m  , BMI Body mass index is 22.81 kg/m. GEN: Well nourished, well developed, in no acute distress  HEENT: normal  Neck: no JVD, carotid bruits, or masses Cardiac: RRR; no murmurs, rubs, or gallops,no edema  Respiratory:  clear to auscultation  bilaterally, normal work of breathing GI: soft, nontender, nondistended, + BS MS: no deformity or atrophy  Skin: warm and dry Neuro:  Strength and sensation are intact Psych: euthymic mood, full affect  EKG:  EKG is ordered today. Personal review of the ekg ordered shows sinus rhythm, rate 66, anterior T wave inversions  Recent Labs: 04/29/2017: ALT 16; TSH 2.531 04/30/2017: Hemoglobin 12.7; Platelets 131 05/03/2017: BUN 16; Creatinine, Ser 0.82; Magnesium 1.7; Potassium 4.0; Sodium 130    Lipid Panel     Component Value Date/Time   CHOL 188 04/30/2017 0338   TRIG 24 04/30/2017 0338   HDL 106 04/30/2017 0338   CHOLHDL 1.8 04/30/2017 0338   VLDL 5 04/30/2017 0338   LDLCALC 77 04/30/2017 0338     Wt Readings from Last 3 Encounters:  03/22/18 135 lb (61.2 kg)  12/20/17 135 lb (61.2 kg)  06/08/17 135 lb (61.2 kg)      Other studies Reviewed: Additional studies/ records that were reviewed today include: TTE 05/02/17, Cath 04/30/17, ETT 05/11/17  Review of the above records today demonstrates:  - Left ventricle: The cavity size was normal. Systolic function was   normal. The estimated ejection fraction was in the range of 60%   to 65%. Wall motion was normal; there were no regional wall   motion abnormalities. - Aortic valve: Trileaflet; mildly thickened, mildly calcified   leaflets. - Mitral valve: There was trivial regurgitation. - Right atrium: The atrium was mildly dilated. - Tricuspid valve: There was trivial regurgitation.    The left ventricular systolic function is normal. The left ventricular ejection fraction is greater than 65% by visual estimate.  LV end diastolic pressure is normal. 11 mmHg  Angiographically normal coronary arteries   Angiographic normal coronary arteries with normal LV function and EDP.   Blood pressure demonstrated a normal response to exercise.  No T wave inversion was noted during stress.  There was no ST segment deviation noted during  stress.  The patient requested the test to be stopped.  Heart rate demonstrated pharmacologically blunted response to exercise  Overall, the patient's exercise capacity was normal.  Duke Treadmill Score: low risk   CMRI 05/03/17 1) Normal LV size and function EF 75%  2) Normal RV size and function no evidence of RV dysplasia  3) No delayed gadolinium enhancement in LV myocardium on inversion recovery sequences  4) Normal AV, MV and TV  5) Prominent epicardial fat pad  ASSESSMENT AND PLAN:  1.  Ventricular tachycardia: Left heart cath and cardiac MRI without evidence of coronary disease or ventricular scar.  Has been put on pro-path known is tolerating well.  No further episodes of VT.  2. Fatigue: Feeling better since stopping metoprolol and starting diltiazem.  She feels that her fatigue is likely due to her  seizure medications.    Current medicines are reviewed at length with the patient today.   The patient does not have concerns regarding her medicines.  The following changes were made today: None  Labs/ tests ordered today include:  Orders Placed This Encounter  Procedures  . EKG 12-Lead     Disposition:   FU with Velmer Woelfel 12 months  Signed, Jacy Brocker Meredith Leeds, MD  03/22/2018 10:04 AM     [facility] [address] [place] [address] [phone] (office) [phone] (fax)

## 2018-03-22 NOTE — Patient Instructions (Signed)
Medication Instructions:  Your physician recommends that you continue on your current medications as directed. Please refer to the Current Medication list given to you today.  Labwork: None ordered  Testing/Procedures: None ordered  Follow-Up: Your physician wants you to follow-up in: 6 months with Tommye Standard, PA.  You will receive a reminder letter in the mail two months in advance. If you don't receive a letter, please call our office to schedule the follow-up appointment.  Your physician wants you to follow-up in: 1 year with Dr. Curt Bears.  You will receive a reminder letter in the mail two months in advance. If you don't receive a letter, please call our office to schedule the follow-up appointment.  * If you need a refill on your cardiac medications before your next appointment, please call your pharmacy.   *Please note that any paperwork needing to be filled out by the provider will need to be addressed at the front desk prior to seeing the provider. Please note that any FMLA, disability or other documents regarding health condition is subject to a $25.00 charge that must be received prior to completion of paperwork in the form of a money order or check.  Thank you for choosing [facility]!!   Trinidad Curet, RN [phone]

## 2018-04-08 ENCOUNTER — Other Ambulatory Visit: Payer: Self-pay | Admitting: Cardiology

## 2018-05-24 ENCOUNTER — Other Ambulatory Visit: Payer: Self-pay | Admitting: Cardiology

## 2018-09-11 NOTE — Progress Notes (Signed)
Cardiology Office Note Date:  09/14/2018  Patient ID:  Maki, Hege 06-22-1956, MRN [id number] PCP:  Aretta Nip, MD  Electrophysiologist:  Dr. Curt Bears    Chief Complaint: 6 mo f/u  History of Present Illness: KELBIE MORO is a 62 y.o. female with history of seizure d/o, palpitations > PVC's, exercise induced VT.  She was originally seen by Dr. Gwenlyn Found on 04/15/17 for evaluation of infrequent exertional CP and near syncope on 03/28/17 while she was singing in the choir. This was thought to be vasovagal. Her chest pain was felt to be atypical and she was set up for GXT. This was set up for 04/29/17. During phase III of exercise, she developed ventricular tachycardia. The treadmill was stopped and her VT resolved. She then had 1 mm of ST depression in the inferior leads concerning for possible ischemia. She was added onto Dr. Jacalyn Lefevre schedule for further evaluation. It was decided to admit her for further work up and testing.   Admitted to [facility] from 5/3-05/03/17. LHC showed normal coronaries. Echo showed normal LV function, no valvular disease, non evidence HCM. Cardiac MRI was normal with no evidence of scar. She was started on Flecainide 100mg  BID and Lopressor 12.5mg  BID and discharged home.   She underwent a GXT on Flecainide on5/15 which was normal.  Subsequently follows with EP service.   She comes today to be seen for Dr. Curt Bears.  She last saw him in march this year.  Doing well from VT/palpitations perspective on the propafenone.  Mention of fatigue, perhaps better after changing from Metoprolol to dilt months prior, remaining fatigue suspect 2/2 anti-seizure med.  She is accompanied by her husband.  She continues to feel her palpitations are well controlled with the propafenone.  No CP or SOB.  She walk for exercise, though has some minimal gait instability with a slight feeling of dizziness/motion.  She has discussed this with her neurologist who told her was common for the  medicines he has her on and suggested she use a cane for safety.  She denies any near syncope or syncope, no CP.  AAD tx: Flecainide started may 2018, did well for a few months, Dec 2018 changed to Propafenone for recurrent palpitations/near syncope.   Past Medical History:  Diagnosis Date  . Arthritis lower back/hands  . Brain injury ([place])   . Cancer [facility]) breast tumor removed  . Leg fracture   . Seizure disorder ([place])   . Seizures ([place])   . VT (ventricular tachycardia) ([place]) 04/29/2017    Past Surgical History:  Procedure Laterality Date  . BREAST SURGERY    . FACIAL FRACTURE SURGERY  orbital bone repair  . LEFT HEART CATH AND CORONARY ANGIOGRAPHY N/A 04/30/2017   Procedure: Left Heart Cath and Coronary Angiography;  Surgeon: Leonie Man, MD;  Location: [place] [facility];  Service: Cardiovascular;  Laterality: N/A;    Current Outpatient Medications  Medication Sig Dispense Refill  . acetaminophen (TYLENOL) 500 MG tablet Take 500 mg by mouth as needed for mild pain or fever.    . calcium-vitamin D (OSCAL WITH D) 250-125 MG-UNIT tablet Take by mouth as directed.     . cloBAZam (ONFI) 10 MG tablet Take 1 tablet (10 mg) in the mornings and one and a half tablets (15 mg) at bedtime by moth daily.    Marland Kitchen diltiazem (CARDIZEM CD) 120 MG 24 hr capsule TAKE 1 CAPSULE BY MOUTH EVERY DAY 90 capsule 2  . divalproex (  DEPAKOTE ER) 500 MG 24 hr tablet Take 1,000 mg by mouth at bedtime.     . Multiple Vitamin (MULITIVITAMIN WITH MINERALS) TABS Take 1 tablet by mouth every morning.     Marland Kitchen omeprazole (PRILOSEC) 20 MG capsule Take 20 mg by mouth 2 (two) times daily.    . polyethylene glycol (MIRALAX / GLYCOLAX) packet Take 17 g by mouth daily.    . propafenone (RYTHMOL SR) 225 MG 12 hr capsule TAKE 1 CAPSULE (225 MG TOTAL) BY MOUTH 2 (TWO) TIMES DAILY. 60 capsule 10   No current facility-administered medications for this visit.     Allergies:   Patient has no known allergies.   Social  History:  The patient  reports that she has never smoked. She has never used smokeless tobacco. She reports that she does not drink alcohol or use drugs.   Family History:  The patient's family history includes Heart failure in her mother.  ROS:  Please see the history of present illness. Otherwise, review of systems is positive for   All other systems are reviewed and otherwise negative.   PHYSICAL EXAM:  VS:  BP 112/68   Pulse 64   Ht 5' 4.5" (1.638 m)   Wt 135 lb (61.2 kg)   BMI 22.81 kg/m  BMI: Body mass index is 22.81 kg/m. Well nourished, well developed, in no acute distress, thin body habitus HEENT: normocephalic, atraumatic  Neck: no JVD, carotid bruits or masses Cardiac:  RRR; no significant murmurs, no rubs, or gallops Lungs:  CTA b/l, no wheezing, rhonchi or rales  Abd: soft, nontender MS: no deformity or atrophy Ext: no edema  Skin: warm and dry, no rash Neuro:  No gross deficits appreciated Psych: euthymic mood, full affect   EKG:  Done today shows SR, 64bpm, PR 215ms, QRS measured 170ms, QTc 462ms  CMRI 05/03/17 1) Normal LV size and function EF 75% 2) Normal RV size and function no evidence of RV dysplasia 3) No delayed gadolinium enhancement in LV myocardium on inversion recovery sequences 4) Normal AV, MV and TV 5) Prominent epicardial fat pad   05/02/17: TTE Study Conclusions - Left ventricle: The cavity size was normal. Systolic function was   normal. The estimated ejection fraction was in the range of 60%   to 65%. Wall motion was normal; there were no regional wall   motion abnormalities. - Aortic valve: Trileaflet; mildly thickened, mildly calcified   leaflets. - Mitral valve: There was trivial regurgitation. - Right atrium: The atrium was mildly dilated. - Tricuspid valve: There was trivial regurgitation.   04/30/18: LHC  The left ventricular systolic function is normal. The left ventricular ejection fraction is greater than 65% by visual  estimate.  LV end diastolic pressure is normal. 11 mmHg  Angiographically normal coronary arteries   Angiographic normal coronary arteries with normal LV function and EDP. Suspect electrical etiology for sustained VT.   Recent Labs: No results found for requested labs within last 8760 hours.  No results found for requested labs within last 8760 hours.   CrCl cannot be calculated (Patient's most recent lab result is older than the maximum 21 days allowed.).   Wt Readings from Last 3 Encounters:  09/14/18 135 lb (61.2 kg)  03/22/18 135 lb (61.2 kg)  12/20/17 135 lb (61.2 kg)     Other studies reviewed: Additional studies/records reviewed today include: summarized above  ASSESSMENT AND PLAN:  1. VT     No palpitations, symptoms with the Propafenone  Stable intervals     Disposition: F/u with Dr. Curt Bears in 6 months, sooner if needed  Current medicines are reviewed at length with the patient today.  The patient did not have any concerns regarding medicines.  Venetia Night, PA-C 09/14/2018 4:09 PM     [facility] [address] [place] [address] [phone] (office)  [phone] (fax)

## 2018-09-14 ENCOUNTER — Ambulatory Visit (INDEPENDENT_AMBULATORY_CARE_PROVIDER_SITE_OTHER): Payer: BC Managed Care – PPO | Admitting: Physician Assistant

## 2018-09-14 VITALS — BP 112/68 | HR 64 | Ht 64.5 in | Wt 135.0 lb

## 2018-09-14 DIAGNOSIS — Z79899 Other long term (current) drug therapy: Secondary | ICD-10-CM

## 2018-09-14 DIAGNOSIS — I472 Ventricular tachycardia, unspecified: Secondary | ICD-10-CM

## 2018-09-14 NOTE — Patient Instructions (Signed)
Medication Instructions:   Your physician recommends that you continue on your current medications as directed. Please refer to the Current Medication list given to you today.  If you need a refill on your cardiac medications before your next appointment, please call your pharmacy.  Labwork: NONE ORDERED  TODAY    Testing/Procedures: NONE ORDERED  TODAY    Follow-Up:  Your physician wants you to follow-up in:  IN  [place] will receive a reminder letter in the mail two months in advance. If you don't receive a letter, please call our office to schedule the follow-up appointment.      Any Other Special Instructions Will Be Listed Below (If Applicable).

## 2018-12-07 ENCOUNTER — Other Ambulatory Visit: Payer: Self-pay | Admitting: Cardiology

## 2019-02-14 ENCOUNTER — Other Ambulatory Visit: Payer: Self-pay | Admitting: *Deleted

## 2019-02-14 MED ORDER — DILTIAZEM HCL ER COATED BEADS 120 MG PO CP24
ORAL_CAPSULE | ORAL | 0 refills | Status: DC
Start: 2019-02-14 — End: 2019-03-21

## 2019-03-07 ENCOUNTER — Encounter: Payer: Self-pay | Admitting: *Deleted

## 2019-03-13 ENCOUNTER — Other Ambulatory Visit: Payer: Self-pay

## 2019-03-13 MED ORDER — PROPAFENONE HCL ER 225 MG PO CP12: 225.0000 mg | ORAL_CAPSULE | Freq: Two times a day (BID) | ORAL | 3 refills | Status: DC

## 2019-03-21 ENCOUNTER — Encounter: Payer: Self-pay | Admitting: Cardiology

## 2019-03-21 ENCOUNTER — Telehealth (INDEPENDENT_AMBULATORY_CARE_PROVIDER_SITE_OTHER): Payer: BC Managed Care – PPO | Admitting: Cardiology

## 2019-03-21 ENCOUNTER — Other Ambulatory Visit: Payer: Self-pay

## 2019-03-21 DIAGNOSIS — I472 Ventricular tachycardia, unspecified: Secondary | ICD-10-CM

## 2019-03-21 MED ORDER — DILTIAZEM HCL ER COATED BEADS 120 MG PO CP24: ORAL_CAPSULE | ORAL | 6 refills | Status: DC

## 2019-03-21 MED ORDER — PROPAFENONE HCL ER 225 MG PO CP12: 225.0000 mg | ORAL_CAPSULE | Freq: Two times a day (BID) | ORAL | 6 refills | Status: DC

## 2019-03-21 NOTE — Progress Notes (Signed)
Electrophysiology TeleHealth Note   Due to national recommendations of social distancing due to COVID 19, an audio/video telehealth visit is felt to be most appropriate for this patient at this time.  See MyChart message from today for the patient's consent to telehealth for [facility].   Date:  03/21/2019   ID:  [name], [name] 07/15/1956, MRN [id number]  Location: patient's home  Provider location: [address], [place]  Evaluation Performed: Follow-up visit  PCP:  Aretta Nip, MD  Cardiologist:  Stanford Breed  Electrophysiologist:  Curt Bears   Chief Complaint:  PVC  History of Present Illness:    [name] is a 63 y.o. female who presents via audio/video conferencing for a telehealth visit today.  Since last being seen in our clinic, the patient reports doing very well.  Today, she denies symptoms of palpitations, chest pain, shortness of breath,  lower extremity edema, dizziness, presyncope, or syncope.  The patient is otherwise without complaint today.  The patient denies symptoms of fevers, chills, cough, or new SOB worrisome for COVID 19.   She had a treadmill test when she was in the hospital 05/16/2017.  During the test, she went into ventricular tachycardia.  The test was stopped and she went back into sinus rhythm.  She has had both a left heart catheterization and a cardiac MRI without evidence of coronary artery disease or scar.  Past Medical History:  Diagnosis Date  . Arthritis lower back/hands  . 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 Cancer [facility]) breast tumor removed  . Leg fracture   . Seizure disorder ([place])   . Seizures ([place])   . VT (ventricular tachycardia) ([place]) 04/29/2017    Past Surgical History:  Procedure Laterality Date  . BREAST SURGERY    . FACIAL FRACTURE SURGERY  orbital bone repair  . LEFT HEART CATH AND CORONARY ANGIOGRAPHY N/A 04/30/2017   Procedure: Left Heart Cath and Coronary Angiography;  Surgeon: Leonie Man, MD;   Location: [place] [facility];  Service: Cardiovascular;  Laterality: N/A;    Current Outpatient Medications  Medication Sig Dispense Refill  . acetaminophen (TYLENOL) 500 MG tablet Take 500 mg by mouth as needed for mild pain or fever.    . calcium-vitamin D (OSCAL WITH D) 250-125 MG-UNIT tablet Take by mouth as directed.     . cloBAZam (ONFI) 10 MG tablet Take 1 tablet (10 mg) in the mornings and one and a half tablets (15 mg) at bedtime by moth daily.    Marland Kitchen diltiazem (CARDIZEM CD) 120 MG 24 hr capsule TAKE 1 CAPSULE BY MOUTH EVERY DAY 90 capsule 0  . divalproex (DEPAKOTE ER) 500 MG 24 hr tablet Take 1,000 mg by mouth at bedtime.     . Multiple Vitamin (MULITIVITAMIN WITH MINERALS) TABS Take 1 tablet by mouth every morning.     Marland Kitchen omeprazole (PRILOSEC) 20 MG capsule Take 20 mg by mouth 2 (two) times daily.    . polyethylene glycol (MIRALAX / GLYCOLAX) packet Take 17 g by mouth daily.    . propafenone (RYTHMOL SR) 225 MG 12 hr capsule Take 1 capsule (225 mg total) by mouth 2 (two) times daily. 60 capsule 3   No current facility-administered medications for this visit.     Allergies:   Patient has no known allergies.   Social History:  The patient  reports that she has never smoked. She has never used smokeless tobacco. She reports that she does not  drink alcohol or use drugs.   Family History:  The patient's  family history includes Heart disease in her mother; Heart failure in her mother; Pancreatic cancer in her father.   ROS:  Please see the history of present illness.   All other systems are personally reviewed and negative.    Exam:    Vital Signs:  BP 112/71   Pulse 61   Wt 134 lb (60.8 kg)   BMI 22.65 kg/m    Well appearing, alert and conversant, regular work of breathing,  good skin color Eyes- anicteric, neuro- grossly intact, skin- no apparent rash or lesions or cyanosis, mouth- oral mucosa is pink   Labs/Other Tests and Data Reviewed:    Recent Labs: No results  found for requested labs within last 8760 hours.   Wt Readings from Last 3 Encounters:  03/21/19 134 lb (60.8 kg)  09/14/18 135 lb (61.2 kg)  03/22/18 135 lb (61.2 kg)     Other studies personally reviewed: Additional studies/ records that were reviewed today include: 09/14/18  Review of the above records today demonstrates: SR   ASSESSMENT & PLAN:    1.  PVCs/VT: Has had cardiac MRI and left heart catheterization without major abnormality.  Is currently on diltiazem and propafenone.  She was weak and fatigued on beta-blockers.  She continues to feel well.  She does have some mild fatigue mainly in the mornings potentially around the time of taking her medications.  Due to that we Kwamane Whack switch her diltiazem to nighttime.  Otherwise no changes.  COVID 19 screen The patient denies symptoms of COVID 19 at this time.  The importance of social distancing was discussed today.  Follow-up:  6 months  Current medicines are reviewed at length with the patient today.   The patient does not have concerns regarding her medicines.  The following changes were made today:  none  Labs/ tests ordered today include:  No orders of the defined types were placed in this encounter.    Patient Risk:  after full review of this patients clinical status, I feel that they are at moderate risk at this time.  Today, I have spent 15 minutes with the patient with telehealth technology discussing PVCs, VT .    Signed, Anjanette Gilkey Meredith Leeds, MD  03/21/2019 9:02 AM     [facility] [address] [place] [address] [phone] (office) [phone] (fax)

## 2019-03-21 NOTE — Addendum Note (Signed)
Addended by: Stanton Kidney on: 03/21/2019 09:16 AM   Modules accepted: Orders

## 2019-09-06 ENCOUNTER — Emergency Department (HOSPITAL_COMMUNITY): Payer: BC Managed Care – PPO

## 2019-09-06 ENCOUNTER — Encounter (HOSPITAL_COMMUNITY): Payer: Self-pay | Admitting: Emergency Medicine

## 2019-09-06 ENCOUNTER — Inpatient Hospital Stay (HOSPITAL_COMMUNITY)
Admission: EM | Admit: 2019-09-06 | Discharge: 2019-09-10 | DRG: 470 | Disposition: A | Discharge status: Home Health | Payer: BC Managed Care – PPO | Attending: Internal Medicine | Admitting: Internal Medicine

## 2019-09-06 DIAGNOSIS — Z79899 Other long term (current) drug therapy: Secondary | ICD-10-CM

## 2019-09-06 DIAGNOSIS — I472 Ventricular tachycardia: Secondary | ICD-10-CM | POA: Diagnosis not present

## 2019-09-06 DIAGNOSIS — R296 Repeated falls: Secondary | ICD-10-CM | POA: Diagnosis present

## 2019-09-06 DIAGNOSIS — F05 Delirium due to known physiological condition: Secondary | ICD-10-CM | POA: Diagnosis not present

## 2019-09-06 DIAGNOSIS — S72009A Fracture of unspecified part of neck of unspecified femur, initial encounter for closed fracture: Secondary | ICD-10-CM

## 2019-09-06 DIAGNOSIS — S72002A Fracture of unspecified part of neck of left femur, initial encounter for closed fracture: Secondary | ICD-10-CM

## 2019-09-06 DIAGNOSIS — D62 Acute posthemorrhagic anemia: Secondary | ICD-10-CM | POA: Diagnosis not present

## 2019-09-06 DIAGNOSIS — Z20828 Contact with and (suspected) exposure to other viral communicable diseases: Secondary | ICD-10-CM | POA: Diagnosis present

## 2019-09-06 DIAGNOSIS — Z01810 Encounter for preprocedural cardiovascular examination: Secondary | ICD-10-CM | POA: Diagnosis not present

## 2019-09-06 DIAGNOSIS — G40909 Epilepsy, unspecified, not intractable, without status epilepticus: Secondary | ICD-10-CM | POA: Diagnosis present

## 2019-09-06 DIAGNOSIS — I9581 Postprocedural hypotension: Secondary | ICD-10-CM | POA: Diagnosis not present

## 2019-09-06 DIAGNOSIS — Z8679 Personal history of other diseases of the circulatory system: Secondary | ICD-10-CM

## 2019-09-06 DIAGNOSIS — R680 Hypothermia, not associated with low environmental temperature: Secondary | ICD-10-CM | POA: Diagnosis not present

## 2019-09-06 DIAGNOSIS — K219 Gastro-esophageal reflux disease without esophagitis: Secondary | ICD-10-CM | POA: Diagnosis present

## 2019-09-06 DIAGNOSIS — Z96642 Presence of left artificial hip joint: Secondary | ICD-10-CM

## 2019-09-06 DIAGNOSIS — I471 Supraventricular tachycardia: Secondary | ICD-10-CM | POA: Diagnosis present

## 2019-09-06 DIAGNOSIS — R001 Bradycardia, unspecified: Secondary | ICD-10-CM | POA: Diagnosis not present

## 2019-09-06 DIAGNOSIS — Z87820 Personal history of traumatic brain injury: Secondary | ICD-10-CM

## 2019-09-06 DIAGNOSIS — S72032A Displaced midcervical fracture of left femur, initial encounter for closed fracture: Secondary | ICD-10-CM | POA: Diagnosis present

## 2019-09-06 DIAGNOSIS — E871 Hypo-osmolality and hyponatremia: Secondary | ICD-10-CM | POA: Diagnosis present

## 2019-09-06 DIAGNOSIS — Z8249 Family history of ischemic heart disease and other diseases of the circulatory system: Secondary | ICD-10-CM

## 2019-09-06 DIAGNOSIS — Z419 Encounter for procedure for purposes other than remedying health state, unspecified: Secondary | ICD-10-CM

## 2019-09-06 DIAGNOSIS — W010XXA Fall on same level from slipping, tripping and stumbling without subsequent striking against object, initial encounter: Secondary | ICD-10-CM | POA: Diagnosis present

## 2019-09-06 DIAGNOSIS — I361 Nonrheumatic tricuspid (valve) insufficiency: Secondary | ICD-10-CM | POA: Diagnosis not present

## 2019-09-06 HISTORY — DX: Fracture of unspecified part of neck of unspecified femur, initial encounter for closed fracture: S72.009A

## 2019-09-06 LAB — CBC WITH DIFFERENTIAL/PLATELET
Abs Immature Granulocytes: 0.02 10*3/uL (ref 0.00–0.07)
Basophils Absolute: 0 10*3/uL (ref 0.0–0.1)
Basophils Relative: 1 %
Eosinophils Absolute: 0.1 10*3/uL (ref 0.0–0.5)
Eosinophils Relative: 2 %
HCT: 36.7 % (ref 36.0–46.0)
Hemoglobin: 12.7 g/dL (ref 12.0–15.0)
Immature Granulocytes: 1 %
Lymphocytes Relative: 26 %
Lymphs Abs: 0.9 10*3/uL (ref 0.7–4.0)
MCH: 33 pg (ref 26.0–34.0)
MCHC: 34.6 g/dL (ref 30.0–36.0)
MCV: 95.3 fL (ref 80.0–100.0)
Monocytes Absolute: 0.4 10*3/uL (ref 0.1–1.0)
Monocytes Relative: 11 %
Neutro Abs: 2 10*3/uL (ref 1.7–7.7)
Neutrophils Relative %: 59 %
Platelets: 180 10*3/uL (ref 150–400)
RBC: 3.85 MIL/uL — ABNORMAL LOW (ref 3.87–5.11)
RDW: 12.8 % (ref 11.5–15.5)
WBC: 3.3 10*3/uL — ABNORMAL LOW (ref 4.0–10.5)
nRBC: 0 % (ref 0.0–0.2)

## 2019-09-06 LAB — SARS CORONAVIRUS 2 BY RT PCR (HOSPITAL ORDER, PERFORMED IN ~~LOC~~ HOSPITAL LAB): SARS Coronavirus 2: NEGATIVE

## 2019-09-06 LAB — PROTIME-INR
INR: 0.9 (ref 0.8–1.2)
Prothrombin Time: 12.4 seconds (ref 11.4–15.2)

## 2019-09-06 LAB — BASIC METABOLIC PANEL
Anion gap: 10 (ref 5–15)
BUN: 16 mg/dL (ref 8–23)
CO2: 29 mmol/L (ref 22–32)
Calcium: 9.4 mg/dL (ref 8.9–10.3)
Chloride: 93 mmol/L — ABNORMAL LOW (ref 98–111)
Creatinine, Ser: 0.82 mg/dL (ref 0.44–1.00)
GFR calc Af Amer: >60 mL/min (ref >60)
GFR calc non Af Amer: >60 mL/min (ref >60)
Glucose, Bld: 98 mg/dL (ref 70–99)
Potassium: 4 mmol/L (ref 3.5–5.1)
Sodium: 132 mmol/L — ABNORMAL LOW (ref 135–145)

## 2019-09-06 LAB — ABO/RH: ABO/RH(D): O NEG

## 2019-09-06 MED ORDER — ONDANSETRON HCL 4 MG/2ML IJ SOLN
4.0000 mg | Freq: Once | INTRAMUSCULAR | Status: AC
  Administered 2019-09-06: 4 mg via INTRAVENOUS
  Filled 2019-09-06: qty 2

## 2019-09-06 MED ORDER — DILTIAZEM HCL ER COATED BEADS 120 MG PO CP24
120.0000 mg | ORAL_CAPSULE | Freq: Every day | ORAL | Status: DC
  Filled 2019-09-06 (×2): qty 1

## 2019-09-06 MED ORDER — ENOXAPARIN SODIUM 40 MG/0.4ML ~~LOC~~ SOLN: 40.0000 mg | SUBCUTANEOUS | Status: DC

## 2019-09-06 MED ORDER — POVIDONE-IODINE 10 % EX SWAB: 2.0000 "application " | Freq: Once | CUTANEOUS | Status: DC

## 2019-09-06 MED ORDER — CLOBAZAM 10 MG PO TABS
15.0000 mg | ORAL_TABLET | Freq: Every day | ORAL | Status: DC
  Administered 2019-09-06 – 2019-09-09 (×4): 15 mg via ORAL
  Filled 2019-09-06 (×4): qty 2

## 2019-09-06 MED ORDER — TRANEXAMIC ACID-NACL 1000-0.7 MG/100ML-% IV SOLN
1000.0000 mg | INTRAVENOUS | Status: AC
  Administered 2019-09-07: 1000 mg via INTRAVENOUS
  Filled 2019-09-06: qty 100

## 2019-09-06 MED ORDER — HYDROMORPHONE HCL 1 MG/ML IJ SOLN
0.5000 mg | INTRAMUSCULAR | Status: DC | PRN
  Administered 2019-09-06: 0.5 mg via INTRAVENOUS
  Filled 2019-09-06: qty 1

## 2019-09-06 MED ORDER — HYDROCODONE-ACETAMINOPHEN 5-325 MG PO TABS
1.0000 | ORAL_TABLET | Freq: Four times a day (QID) | ORAL | Status: DC | PRN
  Administered 2019-09-06 – 2019-09-08 (×2): 2 via ORAL
  Administered 2019-09-08: 1 via ORAL
  Administered 2019-09-08: 2 via ORAL
  Administered 2019-09-09 – 2019-09-10 (×3): 1 via ORAL
  Filled 2019-09-06: qty 1
  Filled 2019-09-06: qty 2
  Filled 2019-09-06 (×2): qty 1
  Filled 2019-09-06 (×2): qty 2
  Filled 2019-09-06: qty 1

## 2019-09-06 MED ORDER — POVIDONE-IODINE 10 % EX SWAB
2.0000 "application " | Freq: Once | CUTANEOUS | Status: AC
  Administered 2019-09-07: 2 via TOPICAL

## 2019-09-06 MED ORDER — MORPHINE SULFATE (PF) 2 MG/ML IV SOLN
0.5000 mg | INTRAVENOUS | Status: DC | PRN
  Administered 2019-09-06 – 2019-09-07 (×4): 0.5 mg via INTRAVENOUS
  Filled 2019-09-06 (×4): qty 1

## 2019-09-06 MED ORDER — CEFAZOLIN SODIUM-DEXTROSE 2-4 GM/100ML-% IV SOLN
2.0000 g | INTRAVENOUS | Status: AC
  Administered 2019-09-07: 2 g via INTRAVENOUS
  Filled 2019-09-06: qty 100

## 2019-09-06 MED ORDER — CHLORHEXIDINE GLUCONATE 4 % EX LIQD
60.0000 mL | Freq: Once | CUTANEOUS | Status: AC
  Administered 2019-09-07: 4 via TOPICAL
  Filled 2019-09-06: qty 60

## 2019-09-06 MED ORDER — PROPAFENONE HCL ER 225 MG PO CP12
225.0000 mg | ORAL_CAPSULE | Freq: Two times a day (BID) | ORAL | Status: DC
  Administered 2019-09-06 – 2019-09-10 (×8): 225 mg via ORAL
  Filled 2019-09-06 (×9): qty 1

## 2019-09-06 MED ORDER — CLOBAZAM 10 MG PO TABS
10.0000 mg | ORAL_TABLET | Freq: Every day | ORAL | Status: DC
  Administered 2019-09-07 – 2019-09-10 (×4): 10 mg via ORAL
  Filled 2019-09-06 (×4): qty 1

## 2019-09-06 MED ORDER — DIVALPROEX SODIUM ER 500 MG PO TB24
1000.0000 mg | ORAL_TABLET | Freq: Every day | ORAL | Status: DC
  Administered 2019-09-06 – 2019-09-09 (×4): 1000 mg via ORAL
  Filled 2019-09-06 (×4): qty 2

## 2019-09-06 MED ORDER — DEXTROSE 5 % IV SOLN: 3.0000 g | INTRAVENOUS | Status: DC

## 2019-09-06 MED ORDER — ENSURE PRE-SURGERY PO LIQD
296.0000 mL | Freq: Once | ORAL | Status: AC
  Administered 2019-09-06: 296 mL via ORAL
  Filled 2019-09-06: qty 296

## 2019-09-06 NOTE — ED Provider Notes (Addendum)
[place] DEPT Provider Note   CSN: [id number] Arrival date & time: 09/06/19  1251     History   Chief Complaint Chief Complaint  Patient presents with  . Fall    HPI Emily LEALANI House is a 63 y.o. female.     Patient presents to the emergency department today with left hip pain.  Patient states that she tripped and fell while taking out recycling.  She fell directly onto her left hip and could not get up.  Her husband was at home and came to her aid.  Because she could not stand, EMS was called.  No treatments prior to arrival.  Patient has a history of seizure disorder which is well controlled on medication.  Also history of sustained ventricular tachycardia on medication for this.  She denies any head or neck injury.  No loss of consciousness.  Onset of symptoms acute.  Course is constant.  Pain is worse with movement.     Past Medical History:  Diagnosis Date  . Arthritis lower back/hands  . 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 Cancer [facility]) breast tumor removed  . Leg fracture   . Seizure disorder ([place])   . Seizures ([place])   . VT (ventricular tachycardia) ([place]) 04/29/2017    Patient Active Problem List   Diagnosis Date Noted  . Seizure disorder ([place])   . Sustained ventricular tachycardia ([place]) 04/29/2017  . Atypical chest pain 04/15/2017  . Localization-related (focal) (partial) symptomatic epilepsy and epileptic syndromes with complex partial seizures, not intractable, without status epilepticus ([place]) 02/10/2016    Past Surgical History:  Procedure Laterality Date  . BREAST SURGERY    . FACIAL FRACTURE SURGERY  orbital bone repair  . LEFT HEART CATH AND CORONARY ANGIOGRAPHY N/A 04/30/2017   Procedure: Left Heart Cath and Coronary Angiography;  Surgeon: Leonie Man, MD;  Location: [place] [facility];  Service: Cardiovascular;  Laterality: N/A;     OB History   No obstetric history on file.      Home Medications    Prior to Admission  medications   Medication Sig Start Date End Date Taking? Authorizing Provider  acetaminophen (TYLENOL) 500 MG tablet Take 500 mg by mouth as needed for mild pain or fever.    [provider]  calcium-vitamin D (OSCAL WITH D) 250-125 MG-UNIT tablet Take by mouth as directed.     [provider]  cloBAZam (ONFI) 10 MG tablet Take 1 tablet (10 mg) in the mornings and one and a half tablets (15 mg) at bedtime by moth daily.    [provider]  diltiazem (CARDIZEM CD) 120 MG 24 hr capsule TAKE 1 CAPSULE BY MOUTH EVERY DAY 03/21/19   Camnitz, Ocie Doyne, MD  divalproex (DEPAKOTE ER) 500 MG 24 hr tablet Take 1,000 mg by mouth at bedtime.     [provider]  Multiple Vitamin (MULITIVITAMIN WITH MINERALS) TABS Take 1 tablet by mouth every morning.     [provider]  omeprazole (PRILOSEC) 20 MG capsule Take 20 mg by mouth 2 (two) times daily.    [provider]  polyethylene glycol (MIRALAX / GLYCOLAX) packet Take 17 g by mouth daily.    [provider]  propafenone (RYTHMOL SR) 225 MG 12 hr capsule Take 1 capsule (225 mg total) by mouth 2 (two) times daily. 03/21/19   Camnitz, Ocie Doyne, MD    Family History Family History  Problem Relation Age of Onset  . Heart  failure Mother   . Heart disease Mother   . Pancreatic cancer Father     Social History Social History   Tobacco Use  . Smoking status: Never Smoker  . Smokeless tobacco: Never Used  Substance Use Topics  . Alcohol use: No  . Drug use: No     Allergies   Patient has no known allergies.   Review of Systems Review of Systems  Constitutional: Negative for fever.  HENT: Negative for rhinorrhea and sore throat.   Eyes: Negative for redness.  Respiratory: Negative for cough.   Cardiovascular: Negative for chest pain.  Gastrointestinal: Negative for abdominal pain, diarrhea, nausea and vomiting.  Genitourinary: Negative for dysuria.  Musculoskeletal: Positive for  arthralgias, gait problem and myalgias.  Skin: Negative for rash.  Neurological: Negative for numbness and headaches.     Physical Exam Updated Vital Signs BP (!) 127/59   Pulse 62   Temp 98.2 F (36.8 C)   Resp 18   SpO2 99%   Physical Exam Vitals signs and nursing note reviewed.  Constitutional:      Appearance: She is well-developed.  HENT:     Head: Normocephalic and atraumatic.  Eyes:     General:        Right eye: No discharge.        Left eye: No discharge.     Conjunctiva/sclera: Conjunctivae normal.  Neck:     Musculoskeletal: Normal range of motion and neck supple.  Cardiovascular:     Rate and Rhythm: Normal rate and regular rhythm.     Heart sounds: Normal heart sounds.  Pulmonary:     Effort: Pulmonary effort is normal.     Breath sounds: Normal breath sounds.  Abdominal:     Palpations: Abdomen is soft.     Tenderness: There is no abdominal tenderness. There is no guarding or rebound.  Musculoskeletal:     Left hip: She exhibits decreased range of motion, decreased strength and bony tenderness.     Left knee: She exhibits no swelling and no effusion. No tenderness found.     Left ankle: No tenderness.     Lumbar back: Normal.     Left upper leg: She exhibits tenderness.       Legs:  Skin:    General: Skin is warm and dry.  Neurological:     Mental Status: She is alert.      ED Treatments / Results  Labs (all labs ordered are listed, but only abnormal results are displayed) Labs Reviewed  BASIC METABOLIC PANEL - Abnormal; Notable for the following components:      Result Value   Sodium 132 (*)    Chloride 93 (*)    All other components within normal limits  CBC WITH DIFFERENTIAL/PLATELET - Abnormal; Notable for the following components:   WBC 3.3 (*)    RBC 3.85 (*)    All other components within normal limits  SARS CORONAVIRUS 2 (HOSPITAL ORDER, [place] [facility])  PROTIME-INR  TYPE AND SCREEN  ABO/RH    EKG  None  Radiology Dg Hip Unilat With Pelvis 2-3 Views Left  Result Date: 09/06/2019 CLINICAL DATA:  63 year old female with history of trauma from a fall complaining of left-sided hip pain. EXAM: DG HIP (WITH OR WITHOUT PELVIS) 2-3V LEFT COMPARISON:  No priors. FINDINGS: Transcervical left femoral neck fracture with 2.1 cm proximal migration of the distal fracture fragment. Femoral head remains in the left acetabulum. Bony pelvis and right  proximal femur otherwise appear intact. IMPRESSION: 1. Displaced transcervical left femoral neck fracture, as above. Electronically Signed   By: Vinnie Langton M.D.   On: 09/06/2019 14:21    Procedures Procedures (including critical care time)  Medications Ordered in ED Medications  HYDROmorphone (DILAUDID) injection 0.5 mg (0.5 mg Intravenous Given 09/06/19 1447)  ondansetron (ZOFRAN) injection 4 mg (4 mg Intravenous Given 09/06/19 1447)     Initial Impression / Assessment and Plan / ED Course  I have reviewed the triage vital signs and the nursing notes.  Pertinent labs & imaging results that were available during my care of the patient were reviewed by me and considered in my medical decision making (see chart for details).        Patient seen and examined. Work-up initiated. Medications ordered.  X-rays reviewed.  Patient with left femoral neck fracture.  Will require repair.  Will discuss with orthopedic surgery admit patient to hospital.  Vital signs reviewed and are as follows: BP (!) 127/59   Pulse 62   Temp 98.2 F (36.8 C)   Resp 18   SpO2 99%   2:57 PM I spoke with Dr. Veverly Fells who will see patient.   Patient reassessed.  Pain adequately controlled.  Updated on plan to this point.  4:22 PM Dr. Veverly Fells has consulted. I spoke with Dr. Verlon Au who will admit.   Given patient's cardiac history, requested preop eval by cardiology.  He will see in a.m.  Plan for OR at [facility].   Final Clinical Impressions(s) / ED Diagnoses   Final diagnoses:   Closed left hip fracture, initial encounter ([place])   Admit.   ED Discharge Orders    None       Carlisle Cater, PA-C 09/06/19 1623    Carlisle Cater, PA-C 09/06/19 1625    Julianne Rice, MD 09/12/19 1718

## 2019-09-06 NOTE — ED Triage Notes (Signed)
Per EMS-states she tripped and fell taking out the trash-complaining of left thigh pain, no LOC

## 2019-09-06 NOTE — Consult Note (Signed)
Reason for Consult:Left displaced femoral neck fracture Referring Physician: EDP  [name] is an 63 y.o. female.  HPI: 63 yo active community ambulator who reports falling in her carport today injuring her left hip.  Patent complains of severe pain and was unable to stand after the fall. She denies other complaints. She does report some dizziness due to "heart meds" at times making her unsteady. She denies LOC.   Past Medical History:  Diagnosis Date  . Arthritis lower back/hands  . Brain injury ([place])   . Cancer [facility]) breast tumor removed  . Leg fracture   . Seizure disorder ([place])   . Seizures ([place])   . VT (ventricular tachycardia) ([place]) 04/29/2017    Past Surgical History:  Procedure Laterality Date  . BREAST SURGERY    . FACIAL FRACTURE SURGERY  orbital bone repair  . LEFT HEART CATH AND CORONARY ANGIOGRAPHY N/A 04/30/2017   Procedure: Left Heart Cath and Coronary Angiography;  Surgeon: Leonie Man, MD;  Location: [place] [facility];  Service: Cardiovascular;  Laterality: N/A;    Family History  Problem Relation Age of Onset  . Heart failure Mother   . Heart disease Mother   . Pancreatic cancer Father     Social History:  reports that she has never smoked. She has never used smokeless tobacco. She reports that she does not drink alcohol or use drugs.  Allergies: No Known Allergies  Medications: I have reviewed the patient's current medications.  Results for orders placed or performed during the hospital encounter of 09/06/19 (from the past 48 hour(s))  Basic metabolic panel     Status: Abnormal   Collection Time: 09/06/19  2:22 PM  Result Value Ref Range   Sodium 132 (L) 135 - 145 mmol/L   Potassium 4.0 3.5 - 5.1 mmol/L   Chloride 93 (L) 98 - 111 mmol/L   CO2 29 22 - 32 mmol/L   Glucose, Bld 98 70 - 99 mg/dL   BUN 16 8 - 23 mg/dL   Creatinine, Ser 0.82 0.44 - 1.00 mg/dL   Calcium 9.4 8.9 - 10.3 mg/dL   GFR calc non Af Amer >60 >60 mL/min   GFR calc Af Amer  >60 >60 mL/min   Anion gap 10 5 - 15    Comment: Performed at [facility], [place] [address] [place], [place], [place] [address]  CBC WITH DIFFERENTIAL     Status: Abnormal   Collection Time: 09/06/19  2:22 PM  Result Value Ref Range   WBC 3.3 (L) 4.0 - 10.5 K/uL   RBC 3.85 (L) 3.87 - 5.11 MIL/uL   Hemoglobin 12.7 12.0 - 15.0 g/dL   HCT 36.7 36.0 - 46.0 %   MCV 95.3 80.0 - 100.0 fL   MCH 33.0 26.0 - 34.0 pg   MCHC 34.6 30.0 - 36.0 g/dL   RDW 12.8 11.5 - 15.5 %   Platelets 180 150 - 400 K/uL   nRBC 0.0 0.0 - 0.2 %   Neutrophils Relative % 59 %   Neutro Abs 2.0 1.7 - 7.7 K/uL   Lymphocytes Relative 26 %   Lymphs Abs 0.9 0.7 - 4.0 K/uL   Monocytes Relative 11 %   Monocytes Absolute 0.4 0.1 - 1.0 K/uL   Eosinophils Relative 2 %   Eosinophils Absolute 0.1 0.0 - 0.5 K/uL   Basophils Relative 1 %   Basophils Absolute 0.0 0.0 - 0.1 K/uL   Immature Granulocytes 1 %   Abs Immature  Granulocytes 0.02 0.00 - 0.07 K/uL    Comment: Performed at [facility], [place] [address] [place], [place] [address]  Protime-INR     Status: None   Collection Time: 09/06/19  2:22 PM  Result Value Ref Range   Prothrombin Time 12.4 11.4 - 15.2 seconds   INR 0.9 0.8 - 1.2    Comment: (NOTE) INR goal varies based on device and disease states. Performed at [facility], [place] [address] [place], [place] [address]   SARS Coronavirus 2 [facility] order, Performed in [facility] hospital lab) Nasopharyngeal Nasopharyngeal Swab     Status: None   Collection Time: 09/06/19  2:32 PM   Specimen: Nasopharyngeal Swab  Result Value Ref Range   SARS Coronavirus 2 NEGATIVE NEGATIVE    Comment: (NOTE) If result is NEGATIVE SARS-CoV-2 target nucleic acids are NOT DETECTED. The SARS-CoV-2 RNA is generally detectable in upper and lower  respiratory specimens during the acute phase of infection. The lowest  concentration of SARS-CoV-2 viral copies this assay can detect is 250   copies / mL. A negative result does not preclude SARS-CoV-2 infection  and should not be used as the sole basis for treatment or other  patient management decisions.  A negative result may occur with  improper specimen collection / handling, submission of specimen other  than nasopharyngeal swab, presence of viral mutation(s) within the  areas targeted by this assay, and inadequate number of viral copies  (<250 copies / mL). A negative result must be combined with clinical  observations, patient history, and epidemiological information. If result is POSITIVE SARS-CoV-2 target nucleic acids are DETECTED. The SARS-CoV-2 RNA is generally detectable in upper and lower  respiratory specimens dur ing the acute phase of infection.  Positive  results are indicative of active infection with SARS-CoV-2.  Clinical  correlation with patient history and other diagnostic information is  necessary to determine patient infection status.  Positive results do  not rule out bacterial infection or co-infection with other viruses. If result is PRESUMPTIVE POSTIVE SARS-CoV-2 nucleic acids MAY BE PRESENT.   A presumptive positive result was obtained on the submitted specimen  and confirmed on repeat testing.  While 2019 novel coronavirus  (SARS-CoV-2) nucleic acids may be present in the submitted sample  additional confirmatory testing may be necessary for epidemiological  and / or clinical management purposes  to differentiate between  SARS-CoV-2 and other Sarbecovirus currently known to infect humans.  If clinically indicated additional testing with an alternate test  methodology [phone]) is advised. The SARS-CoV-2 RNA is generally  detectable in upper and lower respiratory sp ecimens during the acute  phase of infection. The expected result is Negative. Fact Sheet for Patients:  StrictlyIdeas.no Fact Sheet for Healthcare  Providers: BankingDealers.co.za This test is not yet approved or cleared by the [place] FDA and has been authorized for detection and/or diagnosis of SARS-CoV-2 by FDA under an Emergency Use Authorization (EUA).  This EUA will remain in effect (meaning this test can be used) for the duration of the COVID-19 declaration under Section 564(b)(1) of the Act, 21 U.S.C. section 360bbb-3(b)(1), unless the authorization is terminated or revoked sooner. Performed at [facility], [place] [address], [place],  [address]   Type and screen [place]     Status: None   Collection Time: 09/06/19  2:41 PM  Result Value Ref Range   ABO/RH(D) O NEG    Antibody Screen NEG  Sample Expiration      09/09/2019,2359 Performed at [facility], [place] [address] [place], [place] [address]   ABO/Rh     Status: None   Collection Time: 09/06/19  2:41 PM  Result Value Ref Range   ABO/RH(D)      Jenetta Downer NEG Performed at [place] [address] [place],  [address]     Dg Chest Port 1 View  Result Date: 09/06/2019 CLINICAL DATA:  Preop hip fracture EXAM: PORTABLE CHEST 1 VIEW COMPARISON:  03/28/2017 FINDINGS: The heart size and mediastinal contours are within normal limits. Both lungs are clear. The visualized skeletal structures are unremarkable. Previously noted left apical nodularity is less apparent. IMPRESSION: No active disease. Electronically Signed   By: Donavan Foil M.D.   On: 09/06/2019 15:08   Dg Hip Unilat With Pelvis 2-3 Views Left  Result Date: 09/06/2019 CLINICAL DATA:  63 year old female with history of trauma from a fall complaining of left-sided hip pain. EXAM: DG HIP (WITH OR WITHOUT PELVIS) 2-3V LEFT COMPARISON:  No priors. FINDINGS: Transcervical left femoral neck fracture with 2.1 cm proximal migration of the distal fracture fragment. Femoral head remains in the left acetabulum.  Bony pelvis and right proximal femur otherwise appear intact. IMPRESSION: 1. Displaced transcervical left femoral neck fracture, as above. Electronically Signed   By: Vinnie Langton M.D.   On: 09/06/2019 14:21    ROS Blood pressure (!) 127/59, pulse 62, temperature 98.2 F (36.8 C), resp. rate 18, SpO2 99 %. Physical Exam AAO, moderate distress on ED stretcher Neck non tender with normal pain free ROM Bilateral clavicles and shoulders nontender and no swelling or deformity Bilateral UEs with pain free AROM and normal motor strength and normal sensation T and L spine non tender with no step-offs Right LE: hip, knee and ankle ROM pain free with NVI distally Left LE shortened and externally rotated, knee non tender No distal swelling and NVI  Assessment/Plan: Displaced left femoral neck fracture after mechanical fall. Plan is for left total hip replacement via anterior approach. Patient and family agree with this plan. Will need medicine and cards clearance  Spoke with Dr Rod Can, hip replacement specialist who intends to operate tomorrow if cleared medically.  Pain management and mechanical DVT prophylaxis for now  Augustin Schooling 09/06/2019, 4:20 PM

## 2019-09-06 NOTE — H&P (Signed)
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 Ouida Sills of [facility] neurology, prior history of VT with irregular heartbeats-developed SVT during exercise treadmill 04/29/2017-echo at that time was normal cardiac MRI normal and was started on flecainide Lopressor and follows with Dr. Lynnda Shields heart cath 04/30/2018 EF 65%  Comes to emergency room with accidental fall and is accompanied by her husband-10 on 10 pain outside of fall-husband claims that ever since she has been on [place] for the past several years she has had increase in frequency of falls Found to have left hip fracture Dr. Alma Friendly consulted and will be performing surgery a.m. 9/10-because of her prior cardiac history will be getting cardiology input Coronavirus testing negative   Review of Systems:  Pertinent +'s: Pain in hip Pertinent -"s: No dizziness no blurred vision no double vision  ED Course: Given IV pain medication, given some fluids kept n.p.o. after midnight   Past Medical History:  Diagnosis Date  . Arthritis lower back/hands  . Brain injury ([place])   . Cancer [facility]) breast tumor removed  . Leg fracture   . Seizure disorder ([place])   . Seizures ([place])   . VT (ventricular tachycardia) ([place]) 04/29/2017   Past Surgical History:  Procedure Laterality Date  . BREAST SURGERY    . FACIAL FRACTURE SURGERY  orbital bone repair  . LEFT HEART CATH AND CORONARY ANGIOGRAPHY N/A 04/30/2017   Procedure: Left Heart Cath and Coronary Angiography;  Surgeon: Leonie Man, MD;  Location: [place] [facility];  Service: Cardiovascular;  Laterality: N/A;    reports that she has never smoked. She has never used smokeless tobacco. She reports that she does not drink alcohol or use drugs.  Mobility: Usually independent but is unsteady on her feet  No Known Allergies Family History  Problem Relation Age of Onset  . Heart failure Mother   . Heart disease Mother   . Pancreatic cancer Father    Prior to Admission medications   Medication Sig Start Date End Date Taking? Authorizing Provider  acetaminophen (TYLENOL) 500 MG tablet Take 500 mg by mouth as needed for mild pain or fever.    [provider]  calcium-vitamin D (OSCAL WITH D) 250-125 MG-UNIT tablet Take by mouth as directed.     [provider]  cloBAZam (ONFI) 10 MG tablet Take 1 tablet (10 mg) in the mornings and one and a half tablets (15 mg) at bedtime by moth daily.    [provider]  diltiazem (CARDIZEM CD) 120 MG 24 hr capsule TAKE 1 CAPSULE BY MOUTH EVERY DAY 03/21/19   Camnitz, Ocie Doyne, MD  divalproex (DEPAKOTE ER) 500 MG 24 hr tablet Take 1,000 mg by mouth at bedtime.     [provider]  Multiple Vitamin (MULITIVITAMIN WITH MINERALS) TABS Take 1 tablet by mouth every morning.     [provider]  omeprazole (PRILOSEC) 20 MG capsule Take 20 mg by mouth 2 (two) times daily.    [provider]  polyethylene glycol (MIRALAX / GLYCOLAX) packet Take 17 g by mouth daily.    [provider]  propafenone (RYTHMOL SR) 225 MG 12 hr capsule Take 1 capsule (225 mg total) by mouth 2 (two) times daily. 03/21/19   Constance Haw, MD    Physical Exam:  Vitals:  09/06/19 1418  BP: (!) 127/59  Pulse: 62  Resp: 18  Temp: 98.2 F (36.8 C)  SpO2: 99%     Awake coherent pleasant  I have personally reviewed following labs and imaging studies  Labs:   Sodium 132 potassium 4.0 BUN/creatinine normal  White count 3.3 hemoglobin 12.7 platelet 180  INR 0.9   Imaging studies:   X-ray shows hip fracture  Medical tests:   EKG independently reviewed: EKG shows PR interval 0.12 QRS axis 70 degrees no ST inversions QTC 435  Test discussed with performing physician:  Yes  Decision to obtain old  records:   Yes  Review and summation of old records:   Yes  Active Problems:   * No active hospital problems. *   Assessment/Plan Hip fracture Defer to orthopedics pain control stepwise Norco, IV morphine etc. High functioning at baseline can perform at least 4 METS she is moderate risk per peri-operative mortality calculators History of VT Cardiology consulted to see the patient given her arrhythmia history-doubt this would change the need for surgery and she is at moderate risk as stated above Continue propafenone 225 twice daily twice daily, Cardizem CD 120 Check magnesium in a.m. Outpatient cardiology follow-up needed Seizure disorder Patient on multiple neuroleptic agents including Onfi 10 AM 15 p.m. which are continued as well as Depakote 1000 p.m.    Severity of Illness: The appropriate patient status for this patient is INPATIENT. Inpatient status is judged to be reasonable and necessary in order to provide the required intensity of service to ensure the patient's safety. The patient's presenting symptoms, physical exam findings, and initial radiographic and laboratory data in the context of their chronic comorbidities is felt to place them at high risk for further clinical deterioration. Furthermore, it is not anticipated that the patient will be medically stable for discharge from the hospital within 2 midnights of admission. The following factors support the patient status of inpatient.   " The patient's presenting symptoms include fall and fracture. " The worrisome physical exam findings include fall. " The initial radiographic and laboratory data are worrisome because of fracture. " The chronic co-morbidities include seizure.   * I certify that at the point of admission it is my clinical judgment that the patient will require inpatient hospital care spanning beyond 2 midnights from the point of admission due to high intensity of service, high risk for further  deterioration and high frequency of surveillance required.*  [email or web address]  DVT prophylaxis: Lovenox Code Status: Full Family Communication: Husband at bedside Consults called: Cardiology, orthopedics  Time spent: 32 minutes  Verlon Au, MD Jerl Mina my NP partners at night for Care related issues] [facility] --[employer]ia [employer] OR , [email or web address]; password [facility]  09/06/2019, 4:18 PM

## 2019-09-06 NOTE — H&P (View-Only) (Signed)
Reason for Consult:Left displaced femoral neck fracture Referring Physician: EDP  [name] is an 63 y.o. female.  HPI: 63 yo active community ambulator who reports falling in her carport today injuring her left hip.  Patent complains of severe pain and was unable to stand after the fall. She denies other complaints. She does report some dizziness due to "heart meds" at times making her unsteady. She denies LOC.   Past Medical History:  Diagnosis Date  . Arthritis lower back/hands  . Brain injury ([place])   . Cancer [facility]) breast tumor removed  . Leg fracture   . Seizure disorder ([place])   . Seizures ([place])   . VT (ventricular tachycardia) ([place]) 04/29/2017    Past Surgical History:  Procedure Laterality Date  . BREAST SURGERY    . FACIAL FRACTURE SURGERY  orbital bone repair  . LEFT HEART CATH AND CORONARY ANGIOGRAPHY N/A 04/30/2017   Procedure: Left Heart Cath and Coronary Angiography;  Surgeon: Leonie Man, MD;  Location: [place] [facility];  Service: Cardiovascular;  Laterality: N/A;    Family History  Problem Relation Age of Onset  . Heart failure Mother   . Heart disease Mother   . Pancreatic cancer Father     Social History:  reports that she has never smoked. She has never used smokeless tobacco. She reports that she does not drink alcohol or use drugs.  Allergies: No Known Allergies  Medications: I have reviewed the patient's current medications.  Results for orders placed or performed during the hospital encounter of 09/06/19 (from the past 48 hour(s))  Basic metabolic panel     Status: Abnormal   Collection Time: 09/06/19  2:22 PM  Result Value Ref Range   Sodium 132 (L) 135 - 145 mmol/L   Potassium 4.0 3.5 - 5.1 mmol/L   Chloride 93 (L) 98 - 111 mmol/L   CO2 29 22 - 32 mmol/L   Glucose, Bld 98 70 - 99 mg/dL   BUN 16 8 - 23 mg/dL   Creatinine, Ser 0.82 0.44 - 1.00 mg/dL   Calcium 9.4 8.9 - 10.3 mg/dL   GFR calc non Af Amer >60 >60 mL/min   GFR calc Af Amer  >60 >60 mL/min   Anion gap 10 5 - 15    Comment: Performed at [facility], [place] [address], [place], [place] [address]  CBC WITH DIFFERENTIAL     Status: Abnormal   Collection Time: 09/06/19  2:22 PM  Result Value Ref Range   WBC 3.3 (L) 4.0 - 10.5 K/uL   RBC 3.85 (L) 3.87 - 5.11 MIL/uL   Hemoglobin 12.7 12.0 - 15.0 g/dL   HCT 36.7 36.0 - 46.0 %   MCV 95.3 80.0 - 100.0 fL   MCH 33.0 26.0 - 34.0 pg   MCHC 34.6 30.0 - 36.0 g/dL   RDW 12.8 11.5 - 15.5 %   Platelets 180 150 - 400 K/uL   nRBC 0.0 0.0 - 0.2 %   Neutrophils Relative % 59 %   Neutro Abs 2.0 1.7 - 7.7 K/uL   Lymphocytes Relative 26 %   Lymphs Abs 0.9 0.7 - 4.0 K/uL   Monocytes Relative 11 %   Monocytes Absolute 0.4 0.1 - 1.0 K/uL   Eosinophils Relative 2 %   Eosinophils Absolute 0.1 0.0 - 0.5 K/uL   Basophils Relative 1 %   Basophils Absolute 0.0 0.0 - 0.1 K/uL   Immature Granulocytes 1 %   Abs Immature  Granulocytes 0.02 0.00 - 0.07 K/uL    Comment: Performed at [facility], [place] [address] [place], [place] [address]  Protime-INR     Status: None   Collection Time: 09/06/19  2:22 PM  Result Value Ref Range   Prothrombin Time 12.4 11.4 - 15.2 seconds   INR 0.9 0.8 - 1.2    Comment: (NOTE) INR goal varies based on device and disease states. Performed at [facility], [place] [address], [place], [place] [address]   SARS Coronavirus 2 [facility] order, Performed in [facility] hospital lab) Nasopharyngeal Nasopharyngeal Swab     Status: None   Collection Time: 09/06/19  2:32 PM   Specimen: Nasopharyngeal Swab  Result Value Ref Range   SARS Coronavirus 2 NEGATIVE NEGATIVE    Comment: (NOTE) If result is NEGATIVE SARS-CoV-2 target nucleic acids are NOT DETECTED. The SARS-CoV-2 RNA is generally detectable in upper and lower  respiratory specimens during the acute phase of infection. The lowest  concentration of SARS-CoV-2 viral copies this assay can detect is 250   copies / mL. A negative result does not preclude SARS-CoV-2 infection  and should not be used as the sole basis for treatment or other  patient management decisions.  A negative result may occur with  improper specimen collection / handling, submission of specimen other  than nasopharyngeal swab, presence of viral mutation(s) within the  areas targeted by this assay, and inadequate number of viral copies  (<250 copies / mL). A negative result must be combined with clinical  observations, patient history, and epidemiological information. If result is POSITIVE SARS-CoV-2 target nucleic acids are DETECTED. The SARS-CoV-2 RNA is generally detectable in upper and lower  respiratory specimens dur ing the acute phase of infection.  Positive  results are indicative of active infection with SARS-CoV-2.  Clinical  correlation with patient history and other diagnostic information is  necessary to determine patient infection status.  Positive results do  not rule out bacterial infection or co-infection with other viruses. If result is PRESUMPTIVE POSTIVE SARS-CoV-2 nucleic acids MAY BE PRESENT.   A presumptive positive result was obtained on the submitted specimen  and confirmed on repeat testing.  While 2019 novel coronavirus  (SARS-CoV-2) nucleic acids may be present in the submitted sample  additional confirmatory testing may be necessary for epidemiological  and / or clinical management purposes  to differentiate between  SARS-CoV-2 and other Sarbecovirus currently known to infect humans.  If clinically indicated additional testing with an alternate test  methodology [phone]) is advised. The SARS-CoV-2 RNA is generally  detectable in upper and lower respiratory sp ecimens during the acute  phase of infection. The expected result is Negative. Fact Sheet for Patients:  StrictlyIdeas.no Fact Sheet for Healthcare  Providers: BankingDealers.co.za This test is not yet approved or cleared by the [place] FDA and has been authorized for detection and/or diagnosis of SARS-CoV-2 by FDA under an Emergency Use Authorization (EUA).  This EUA will remain in effect (meaning this test can be used) for the duration of the COVID-19 declaration under Section 564(b)(1) of the Act, 21 U.S.C. section 360bbb-3(b)(1), unless the authorization is terminated or revoked sooner. Performed at [facility], [place] [address] [place], [place] [address]   Type and screen [place]     Status: None   Collection Time: 09/06/19  2:41 PM  Result Value Ref Range   ABO/RH(D) O NEG    Antibody Screen NEG  Sample Expiration      09/09/2019,2359 Performed at [facility], [place] [address], [place], [place] [address]   ABO/Rh     Status: None   Collection Time: 09/06/19  2:41 PM  Result Value Ref Range   ABO/RH(D)      Jenetta Downer NEG Performed at [place] [address] [place], [place] [address]     Dg Chest Port 1 View  Result Date: 09/06/2019 CLINICAL DATA:  Preop hip fracture EXAM: PORTABLE CHEST 1 VIEW COMPARISON:  03/28/2017 FINDINGS: The heart size and mediastinal contours are within normal limits. Both lungs are clear. The visualized skeletal structures are unremarkable. Previously noted left apical nodularity is less apparent. IMPRESSION: No active disease. Electronically Signed   By: Donavan Foil M.D.   On: 09/06/2019 15:08   Dg Hip Unilat With Pelvis 2-3 Views Left  Result Date: 09/06/2019 CLINICAL DATA:  63 year old female with history of trauma from a fall complaining of left-sided hip pain. EXAM: DG HIP (WITH OR WITHOUT PELVIS) 2-3V LEFT COMPARISON:  No priors. FINDINGS: Transcervical left femoral neck fracture with 2.1 cm proximal migration of the distal fracture fragment. Femoral head remains in the left acetabulum.  Bony pelvis and right proximal femur otherwise appear intact. IMPRESSION: 1. Displaced transcervical left femoral neck fracture, as above. Electronically Signed   By: Vinnie Langton M.D.   On: 09/06/2019 14:21    ROS Blood pressure (!) 127/59, pulse 62, temperature 98.2 F (36.8 C), resp. rate 18, SpO2 99 %. Physical Exam AAO, moderate distress on ED stretcher Neck non tender with normal pain free ROM Bilateral clavicles and shoulders nontender and no swelling or deformity Bilateral UEs with pain free AROM and normal motor strength and normal sensation T and L spine non tender with no step-offs Right LE: hip, knee and ankle ROM pain free with NVI distally Left LE shortened and externally rotated, knee non tender No distal swelling and NVI  Assessment/Plan: Displaced left femoral neck fracture after mechanical fall. Plan is for left total hip replacement via anterior approach. Patient and family agree with this plan. Will need medicine and cards clearance  Spoke with Dr Rod Can, hip replacement specialist who intends to operate tomorrow if cleared medically.  Pain management and mechanical DVT prophylaxis for now  Augustin Schooling 09/06/2019, 4:20 PM

## 2019-09-07 ENCOUNTER — Inpatient Hospital Stay (HOSPITAL_COMMUNITY): Payer: BC Managed Care – PPO | Admitting: Registered Nurse

## 2019-09-07 ENCOUNTER — Encounter (HOSPITAL_COMMUNITY): Payer: Self-pay | Admitting: Certified Registered Nurse Anesthetist

## 2019-09-07 ENCOUNTER — Inpatient Hospital Stay (HOSPITAL_COMMUNITY): Payer: BC Managed Care – PPO

## 2019-09-07 ENCOUNTER — Other Ambulatory Visit: Payer: Self-pay

## 2019-09-07 ENCOUNTER — Encounter (HOSPITAL_COMMUNITY)
Admission: EM | Disposition: A | Discharge status: Home Health | Payer: Self-pay | Source: Home / Self Care | Attending: Internal Medicine

## 2019-09-07 DIAGNOSIS — Z01810 Encounter for preprocedural cardiovascular examination: Secondary | ICD-10-CM

## 2019-09-07 DIAGNOSIS — I472 Ventricular tachycardia: Secondary | ICD-10-CM

## 2019-09-07 HISTORY — PX: TOTAL HIP ARTHROPLASTY: SHX124

## 2019-09-07 LAB — T4, FREE: Free T4: 0.79 ng/dL (ref 0.61–1.12)

## 2019-09-07 LAB — BASIC METABOLIC PANEL
Anion gap: 11 (ref 5–15)
BUN: 22 mg/dL (ref 8–23)
CO2: 26 mmol/L (ref 22–32)
Calcium: 8.6 mg/dL — ABNORMAL LOW (ref 8.9–10.3)
Chloride: 94 mmol/L — ABNORMAL LOW (ref 98–111)
Creatinine, Ser: 0.79 mg/dL (ref 0.44–1.00)
GFR calc Af Amer: >60 mL/min (ref >60)
GFR calc non Af Amer: >60 mL/min (ref >60)
Glucose, Bld: 135 mg/dL — ABNORMAL HIGH (ref 70–99)
Potassium: 3.9 mmol/L (ref 3.5–5.1)
Sodium: 131 mmol/L — ABNORMAL LOW (ref 135–145)

## 2019-09-07 LAB — CBC
HCT: 37.6 % (ref 36.0–46.0)
Hemoglobin: 12.5 g/dL (ref 12.0–15.0)
MCH: 32.5 pg (ref 26.0–34.0)
MCHC: 33.2 g/dL (ref 30.0–36.0)
MCV: 97.7 fL (ref 80.0–100.0)
Platelets: 157 10*3/uL (ref 150–400)
RBC: 3.85 MIL/uL — ABNORMAL LOW (ref 3.87–5.11)
RDW: 12.9 % (ref 11.5–15.5)
WBC: 4.8 10*3/uL (ref 4.0–10.5)
nRBC: 0 % (ref 0.0–0.2)

## 2019-09-07 LAB — SURGICAL PCR SCREEN
MRSA, PCR: NEGATIVE
Staphylococcus aureus: NEGATIVE

## 2019-09-07 LAB — HIV ANTIBODY (ROUTINE TESTING W REFLEX): HIV Screen 4th Generation wRfx: NONREACTIVE

## 2019-09-07 LAB — TSH: TSH: 4.23 u[IU]/mL (ref 0.350–4.500)

## 2019-09-07 LAB — PROCALCITONIN: Procalcitonin: <0.1 ng/mL

## 2019-09-07 LAB — CORTISOL-AM, BLOOD: Cortisol - AM: 4.4 ug/dL — ABNORMAL LOW (ref 6.7–22.6)

## 2019-09-07 LAB — LACTIC ACID, PLASMA: Lactic Acid, Venous: 0.9 mmol/L (ref 0.5–1.9)

## 2019-09-07 SURGERY — ARTHROPLASTY, HIP, TOTAL, ANTERIOR APPROACH
Anesthesia: Monitor Anesthesia Care | Site: Hip | Laterality: Left

## 2019-09-07 MED ORDER — KETOROLAC TROMETHAMINE 30 MG/ML IJ SOLN
INTRAMUSCULAR | Status: AC
  Filled 2019-09-07: qty 1

## 2019-09-07 MED ORDER — DEXAMETHASONE SODIUM PHOSPHATE 10 MG/ML IJ SOLN
4.0000 mg | Freq: Four times a day (QID) | INTRAMUSCULAR | Status: DC
  Administered 2019-09-08 – 2019-09-09 (×6): 4 mg via INTRAVENOUS
  Filled 2019-09-07 (×7): qty 0.4

## 2019-09-07 MED ORDER — LACTATED RINGERS IV SOLN
INTRAVENOUS | Status: DC
  Administered 2019-09-07 (×2): via INTRAVENOUS

## 2019-09-07 MED ORDER — ACETAMINOPHEN 10 MG/ML IV SOLN: 1000.0000 mg | Freq: Once | INTRAVENOUS | Status: DC | PRN

## 2019-09-07 MED ORDER — DOCUSATE SODIUM 100 MG PO CAPS
100.0000 mg | ORAL_CAPSULE | Freq: Two times a day (BID) | ORAL | Status: DC
  Administered 2019-09-07 – 2019-09-09 (×4): 100 mg via ORAL
  Filled 2019-09-07 (×4): qty 1

## 2019-09-07 MED ORDER — ISOPROPYL ALCOHOL 70 % SOLN
Status: DC | PRN
  Administered 2019-09-07: 1 via TOPICAL

## 2019-09-07 MED ORDER — DEXAMETHASONE SODIUM PHOSPHATE 10 MG/ML IJ SOLN: 4.0000 mg | Freq: Once | INTRAMUSCULAR | Status: DC

## 2019-09-07 MED ORDER — PHENYLEPHRINE HCL (PRESSORS) 10 MG/ML IV SOLN
INTRAVENOUS | Status: DC | PRN
  Administered 2019-09-07: 80 ug via INTRAVENOUS

## 2019-09-07 MED ORDER — BUPIVACAINE HCL (PF) 0.75 % IJ SOLN
INTRAMUSCULAR | Status: DC | PRN
  Administered 2019-09-07: 1.6 mL via INTRATHECAL

## 2019-09-07 MED ORDER — SODIUM CHLORIDE 0.9 % IV SOLN
INTRAVENOUS | Status: DC | PRN
  Administered 2019-09-07: 50 ug/min via INTRAVENOUS

## 2019-09-07 MED ORDER — BUPIVACAINE-EPINEPHRINE (PF) 0.5% -1:200000 IJ SOLN
INTRAMUSCULAR | Status: AC
  Filled 2019-09-07: qty 30

## 2019-09-07 MED ORDER — MORPHINE SULFATE (PF) 2 MG/ML IV SOLN
1.0000 mg | INTRAVENOUS | Status: DC | PRN
  Administered 2019-09-07 – 2019-09-08 (×3): 2 mg via INTRAVENOUS
  Filled 2019-09-07 (×3): qty 1

## 2019-09-07 MED ORDER — KETOROLAC TROMETHAMINE 30 MG/ML IJ SOLN
INTRAMUSCULAR | Status: DC | PRN
  Administered 2019-09-07: 30 mg

## 2019-09-07 MED ORDER — CEFAZOLIN SODIUM-DEXTROSE 2-4 GM/100ML-% IV SOLN
2.0000 g | Freq: Four times a day (QID) | INTRAVENOUS | Status: AC
  Administered 2019-09-07 – 2019-09-08 (×2): 2 g via INTRAVENOUS
  Filled 2019-09-07 (×2): qty 100

## 2019-09-07 MED ORDER — ACETAMINOPHEN 160 MG/5ML PO SOLN: 1000.0000 mg | Freq: Once | ORAL | Status: DC | PRN

## 2019-09-07 MED ORDER — PROPOFOL 10 MG/ML IV BOLUS
INTRAVENOUS | Status: AC
  Filled 2019-09-07: qty 20

## 2019-09-07 MED ORDER — METOCLOPRAMIDE HCL 5 MG PO TABS: 5.0000 mg | ORAL_TABLET | Freq: Three times a day (TID) | ORAL | Status: DC | PRN

## 2019-09-07 MED ORDER — DEXAMETHASONE SODIUM PHOSPHATE 10 MG/ML IJ SOLN
INTRAMUSCULAR | Status: AC
  Filled 2019-09-07: qty 1

## 2019-09-07 MED ORDER — ALBUMIN HUMAN 5 % IV SOLN
INTRAVENOUS | Status: AC
  Filled 2019-09-07: qty 250

## 2019-09-07 MED ORDER — FENTANYL CITRATE (PF) 100 MCG/2ML IJ SOLN
INTRAMUSCULAR | Status: AC
  Filled 2019-09-07: qty 2

## 2019-09-07 MED ORDER — PROPOFOL 10 MG/ML IV BOLUS
INTRAVENOUS | Status: DC | PRN
  Administered 2019-09-07: 30 mg via INTRAVENOUS

## 2019-09-07 MED ORDER — FENTANYL CITRATE (PF) 100 MCG/2ML IJ SOLN
INTRAMUSCULAR | Status: DC | PRN
  Administered 2019-09-07 (×2): 50 ug via INTRAVENOUS

## 2019-09-07 MED ORDER — MIDAZOLAM HCL 2 MG/2ML IJ SOLN
INTRAMUSCULAR | Status: AC
  Filled 2019-09-07: qty 2

## 2019-09-07 MED ORDER — ORAL CARE MOUTH RINSE
15.0000 mL | Freq: Two times a day (BID) | OROMUCOSAL | Status: DC
  Administered 2019-09-07 – 2019-09-10 (×7): 15 mL via OROMUCOSAL

## 2019-09-07 MED ORDER — DEXAMETHASONE SODIUM PHOSPHATE 10 MG/ML IJ SOLN: 4.0000 mg | Freq: Four times a day (QID) | INTRAMUSCULAR | Status: DC

## 2019-09-07 MED ORDER — 0.9 % SODIUM CHLORIDE (POUR BTL) OPTIME
TOPICAL | Status: DC | PRN
  Administered 2019-09-07: 14:00:00 1000 mL

## 2019-09-07 MED ORDER — SENNA 8.6 MG PO TABS
1.0000 | ORAL_TABLET | Freq: Two times a day (BID) | ORAL | Status: DC
  Administered 2019-09-07 – 2019-09-09 (×4): 8.6 mg via ORAL
  Filled 2019-09-07 (×4): qty 1

## 2019-09-07 MED ORDER — METOCLOPRAMIDE HCL 5 MG/ML IJ SOLN: 5.0000 mg | Freq: Three times a day (TID) | INTRAMUSCULAR | Status: DC | PRN

## 2019-09-07 MED ORDER — FENTANYL CITRATE (PF) 100 MCG/2ML IJ SOLN
25.0000 ug | INTRAMUSCULAR | Status: DC | PRN
  Administered 2019-09-07 (×2): 25 ug via INTRAVENOUS

## 2019-09-07 MED ORDER — ONDANSETRON HCL 4 MG PO TABS: 4.0000 mg | ORAL_TABLET | Freq: Four times a day (QID) | ORAL | Status: DC | PRN

## 2019-09-07 MED ORDER — BUPIVACAINE-EPINEPHRINE 0.25% -1:200000 IJ SOLN
INTRAMUSCULAR | Status: DC | PRN
  Administered 2019-09-07: 30 mL

## 2019-09-07 MED ORDER — DEXAMETHASONE SODIUM PHOSPHATE 10 MG/ML IJ SOLN
8.0000 mg | Freq: Once | INTRAMUSCULAR | Status: AC
  Administered 2019-09-07: 8 mg via INTRAVENOUS

## 2019-09-07 MED ORDER — SODIUM CHLORIDE (PF) 0.9 % IJ SOLN
INTRAMUSCULAR | Status: DC | PRN
  Administered 2019-09-07: 30 mL

## 2019-09-07 MED ORDER — PROPOFOL 500 MG/50ML IV EMUL
INTRAVENOUS | Status: DC | PRN
  Administered 2019-09-07: 25 ug/kg/min via INTRAVENOUS

## 2019-09-07 MED ORDER — OXYCODONE HCL 5 MG/5ML PO SOLN: 5.0000 mg | Freq: Once | ORAL | Status: DC | PRN

## 2019-09-07 MED ORDER — WATER FOR IRRIGATION, STERILE IR SOLN
Status: DC | PRN
  Administered 2019-09-07: 2000 mL

## 2019-09-07 MED ORDER — PHENOL 1.4 % MT LIQD
1.0000 | OROMUCOSAL | Status: DC | PRN
  Filled 2019-09-07: qty 177

## 2019-09-07 MED ORDER — ONDANSETRON HCL 4 MG/2ML IJ SOLN
4.0000 mg | Freq: Four times a day (QID) | INTRAMUSCULAR | Status: DC | PRN
  Administered 2019-09-08: 4 mg via INTRAVENOUS
  Filled 2019-09-07: qty 2

## 2019-09-07 MED ORDER — SODIUM CHLORIDE 0.9 % IR SOLN
Status: DC | PRN
  Administered 2019-09-07: 3000 mL
  Administered 2019-09-07: 1000 mL

## 2019-09-07 MED ORDER — ALBUMIN HUMAN 5 % IV SOLN
12.5000 g | Freq: Once | INTRAVENOUS | Status: AC
  Administered 2019-09-07: 12.5 g via INTRAVENOUS

## 2019-09-07 MED ORDER — ENOXAPARIN SODIUM 40 MG/0.4ML ~~LOC~~ SOLN
40.0000 mg | SUBCUTANEOUS | Status: DC
  Administered 2019-09-08 – 2019-09-10 (×3): 40 mg via SUBCUTANEOUS
  Filled 2019-09-07 (×3): qty 0.4

## 2019-09-07 MED ORDER — ISOPROPYL ALCOHOL 70 % SOLN
Status: AC
  Filled 2019-09-07: qty 480

## 2019-09-07 MED ORDER — ACETAMINOPHEN 500 MG PO TABS: 1000.0000 mg | ORAL_TABLET | Freq: Once | ORAL | Status: DC | PRN

## 2019-09-07 MED ORDER — PHENYLEPHRINE 40 MCG/ML (10ML) SYRINGE FOR IV PUSH (FOR BLOOD PRESSURE SUPPORT)
PREFILLED_SYRINGE | INTRAVENOUS | Status: DC | PRN
  Administered 2019-09-07 (×2): 80 ug via INTRAVENOUS

## 2019-09-07 MED ORDER — MIDAZOLAM HCL 5 MG/5ML IJ SOLN
INTRAMUSCULAR | Status: DC | PRN
  Administered 2019-09-07 (×2): 1 mg via INTRAVENOUS

## 2019-09-07 MED ORDER — OXYCODONE HCL 5 MG PO TABS: 5.0000 mg | ORAL_TABLET | Freq: Once | ORAL | Status: DC | PRN

## 2019-09-07 MED ORDER — LIDOCAINE 2% (20 MG/ML) 5 ML SYRINGE
INTRAMUSCULAR | Status: AC
  Filled 2019-09-07: qty 5

## 2019-09-07 MED ORDER — MENTHOL 3 MG MT LOZG
1.0000 | LOZENGE | OROMUCOSAL | Status: DC | PRN
  Filled 2019-09-07: qty 9

## 2019-09-07 MED ORDER — SODIUM CHLORIDE (PF) 0.9 % IJ SOLN
INTRAMUSCULAR | Status: AC
  Filled 2019-09-07: qty 50

## 2019-09-07 MED ORDER — COSYNTROPIN 0.25 MG IJ SOLR
0.2500 mg | Freq: Once | INTRAMUSCULAR | Status: AC
  Administered 2019-09-08: 0.25 mg via INTRAVENOUS
  Filled 2019-09-07: qty 0.25

## 2019-09-07 SURGICAL SUPPLY — 61 items
ADH SKN CLS APL DERMABOND .7 (GAUZE/BANDAGES/DRESSINGS) ×2
APL PRP STRL LF DISP 70% ISPRP (MISCELLANEOUS) ×1
BAG DECANTER FOR FLEXI CONT (MISCELLANEOUS) IMPLANT
BAG SPEC THK2 15X12 ZIP CLS (MISCELLANEOUS)
BAG ZIPLOCK 12X15 (MISCELLANEOUS) IMPLANT
BLADE SURG SZ10 CARB STEEL (BLADE) IMPLANT
CHLORAPREP W/TINT 26 (MISCELLANEOUS) ×2 IMPLANT
COVER PERINEAL POST (MISCELLANEOUS) ×2 IMPLANT
COVER SURGICAL LIGHT HANDLE (MISCELLANEOUS) ×2 IMPLANT
COVER WAND RF STERILE (DRAPES) IMPLANT
DECANTER SPIKE VIAL GLASS SM (MISCELLANEOUS) ×2 IMPLANT
DERMABOND ADVANCED (GAUZE/BANDAGES/DRESSINGS) ×2
DERMABOND ADVANCED .7 DNX12 (GAUZE/BANDAGES/DRESSINGS) ×2 IMPLANT
DRAPE IMP U-DRAPE 54X76 (DRAPES) ×2 IMPLANT
DRAPE SHEET LG 3/4 BI-LAMINATE (DRAPES) ×7 IMPLANT
DRAPE STERI IOBAN 125X83 (DRAPES) IMPLANT
DRAPE U-SHAPE 47X51 STRL (DRAPES) ×4 IMPLANT
DRESSING AQUACEL AG SP 3.5X10 (GAUZE/BANDAGES/DRESSINGS) IMPLANT
DRSG AQUACEL AG ADV 3.5X10 (GAUZE/BANDAGES/DRESSINGS) ×2 IMPLANT
DRSG AQUACEL AG SP 3.5X10 (GAUZE/BANDAGES/DRESSINGS) ×2
ELECT PENCIL ROCKER SW 15FT (MISCELLANEOUS) ×2 IMPLANT
ELECT REM PT RETURN 15FT ADLT (MISCELLANEOUS) ×2 IMPLANT
GAUZE SPONGE 4X4 12PLY STRL (GAUZE/BANDAGES/DRESSINGS) ×2 IMPLANT
GLOVE BIO SURGEON STRL SZ8.5 (GLOVE) ×4 IMPLANT
GLOVE BIOGEL PI IND STRL 8.5 (GLOVE) ×1 IMPLANT
GLOVE BIOGEL PI INDICATOR 8.5 (GLOVE) ×1
GOWN SPEC L3 XXLG W/TWL (GOWN DISPOSABLE) ×2 IMPLANT
HANDPIECE INTERPULSE COAX TIP (DISPOSABLE) ×2
HEAD FEM -4XOFST TPR 32X (Joint) IMPLANT
HIP BIOLOX FEM HD 32MM (Joint) ×2 IMPLANT
HOLDER FOLEY CATH W/STRAP (MISCELLANEOUS) ×2 IMPLANT
HOOD PEEL AWAY FLYTE STAYCOOL (MISCELLANEOUS) ×8 IMPLANT
INSERT 0D ECCENTRC 32 HIP (Miscellaneous) ×1 IMPLANT
JET LAVAGE IRRISEPT WOUND (IRRIGATION / IRRIGATOR) ×2
KIT TURNOVER KIT A (KITS) IMPLANT
LAVAGE JET IRRISEPT WOUND (IRRIGATION / IRRIGATOR) ×1 IMPLANT
MANIFOLD NEPTUNE II (INSTRUMENTS) ×2 IMPLANT
MARKER SKIN DUAL TIP RULER LAB (MISCELLANEOUS) ×2 IMPLANT
NDL SAFETY ECLIPSE 18X1.5 (NEEDLE) ×1 IMPLANT
NDL SPNL 18GX3.5 QUINCKE PK (NEEDLE) ×1 IMPLANT
NEEDLE HYPO 18GX1.5 SHARP (NEEDLE) ×2
NEEDLE SPNL 18GX3.5 QUINCKE PK (NEEDLE) ×2 IMPLANT
PACK ANTERIOR HIP CUSTOM (KITS) ×2 IMPLANT
SAW OSC TIP CART 19.5X105X1.3 (SAW) ×2 IMPLANT
SEALER BIPOLAR AQUA 6.0 (INSTRUMENTS) ×2 IMPLANT
SET HNDPC FAN SPRY TIP SCT (DISPOSABLE) ×1 IMPLANT
SHELL ACETAB TRIDENT 48 (Shell) ×1 IMPLANT
STEM ACCOLADE II SZ5 (Stem) ×1 IMPLANT
SUT ETHIBOND NAB CT1 #1 30IN (SUTURE) ×4 IMPLANT
SUT MNCRL AB 3-0 PS2 18 (SUTURE) ×2 IMPLANT
SUT MNCRL AB 4-0 PS2 18 (SUTURE) ×2 IMPLANT
SUT MON AB 2-0 CT1 36 (SUTURE) ×4 IMPLANT
SUT STRATAFIX PDO 1 14 VIOLET (SUTURE) ×2
SUT STRATFX PDO 1 14 VIOLET (SUTURE) ×1
SUT VIC AB 2-0 CT1 27 (SUTURE) ×2
SUT VIC AB 2-0 CT1 TAPERPNT 27 (SUTURE) ×1 IMPLANT
SUTURE STRATFX PDO 1 14 VIOLET (SUTURE) ×1 IMPLANT
SYR 3ML LL SCALE MARK (SYRINGE) ×2 IMPLANT
TRAY FOLEY MTR SLVR 14FR STAT (SET/KITS/TRAYS/PACK) ×1 IMPLANT
WATER STERILE IRR 1000ML POUR (IV SOLUTION) ×2 IMPLANT
YANKAUER SUCT BULB TIP 10FT TU (MISCELLANEOUS) ×2 IMPLANT

## 2019-09-07 NOTE — Progress Notes (Signed)
[facility] Progress Note  Patient: [name] [id number]   PCP: Aretta Nip, MD DOB: 1956-09-28   DOA: 09/06/2019   DOS: 09/07/2019   Date of Service: the patient was seen and examined on 09/07/2019  Chief Complaint  Patient presents with  . Fall    Brief hospital course: Pt. with PMH of SVT; presented with complain of fall, was found to have left knee pain.  Currently further plan is follow-up on postop recovery.  Subjective: Patient reports pain is controlled with medication.  morning the patient is temperature low.  No chills, chest pain, abdominal pain, nausea, vomiting, cough, headache, neck pain, rash anywhere, constipation diarrhea, rash anywhere.  Reports that she actually had similar low-grade temperature in the past which was managed conservatively.  Assessment and Plan: 1.  Left hip fracture. Orthopedic consulted. Patient will undergo surgery today. Cardiology was consulted patient is cleared for surgery from cardiology point of view. Patient does have some hypothermia this morning.  No evidence of active infection.  No other acute abnormality on examination as well as on labs.  Currently cleared for surgery from the medical point of view as well. Continue pain control. Postop anticoagulation per surgery. Post discharge pain medication and anticoagulation per surgery. PT OT consulted.  2.  History of SVT. Cardiology consulted. Recommend to continue propafenone as well as Cardizem. Appreciate their assistance. No further work-up.  3.  Hypothermia. ?  Hypotension Etiology not clear. No evidence of active infection. Procalcitonin negative, lactic acid negative, TSH normal. Cortisol is relatively low. We will check cosyntropin stimulation test tomorrow. We will provide 1 dose of Decadron.  4.  Seizure disorder. Reported dizziness. Patient is chronically on Depakote and clobazam. Check Depakote level as well as ammonia level tomorrow  morning.  Diet: N.p.o..  Diet per surgery. DVT Prophylaxis: Per surgery.  Advance goals of care discussion: Full code  Family Communication: no family was present at bedside, at the time of interview.   Disposition:  Discharge to be determined .  Consultants: Orthopedic Procedures: S/P left hip arthroplasty  Scheduled Meds: . [MAR Hold] cloBAZam  10 mg Oral Daily  . [MAR Hold] cloBAZam  15 mg Oral QHS  . [MAR Hold] diltiazem  120 mg Oral Daily  . [MAR Hold] divalproex  1,000 mg Oral QHS  . [MAR Hold] enoxaparin (LOVENOX) injection  40 mg Subcutaneous Q24H  . [MAR Hold] mouth rinse  15 mL Mouth Rinse BID  . povidone-iodine  2 application Topical Once  . [MAR Hold] propafenone  225 mg Oral Q12H   Continuous Infusions: . acetaminophen    . albumin human    . lactated ringers 100 mL/hr at 09/07/19 1327   PRN Meds: acetaminophen, acetaminophen **OR** acetaminophen (TYLENOL) oral liquid 160 mg/5 mL, fentaNYL (SUBLIMAZE) injection, [MAR Hold] HYDROcodone-acetaminophen, [MAR Hold]  morphine injection, oxyCODONE **OR** oxyCODONE Antibiotics: Anti-infectives (From admission, onward)   Start     Dose/Rate Route Frequency Ordered Stop   09/07/19 0600  ceFAZolin (ANCEF) IVPB 2g/100 mL premix     2 g 200 mL/hr over 30 Minutes Intravenous On call to O.R. 09/06/19 1949 09/07/19 1359   09/07/19 0600  ceFAZolin (ANCEF) 3 g in dextrose 5 % 50 mL IVPB  Status:  Discontinued     3 g 100 mL/hr over 30 Minutes Intravenous On call to O.R. 09/06/19 1949 09/06/19 1956       Objective: Physical Exam: Vitals:   09/07/19 1606 09/07/19 1610 09/07/19 1612 09/07/19 1615  BP: Marland Kitchen)  89/45 (!) 84/58 (!) 92/50 (!) 93/55  Pulse: (!) 57 (!) 57 (!) 54 (!) 51  Resp: 12 13 14 16   Temp:      TempSrc:      SpO2: 100% 100% 100% 100%  Weight:      Height:        Intake/Output Summary (Last 24 hours) at 09/07/2019 1628 Last data filed at 09/07/2019 1557 Gross per 24 hour  Intake 1200 ml  Output 950 ml   Net 250 ml   Filed Weights   09/06/19 1901  Weight: 61.6 kg   General: alert and oriented to time, place, and person. Appear in mild distress, affect appropriate Eyes: PERRL, Conjunctiva normal ENT: Oral Mucosa Clear, moist  Neck: no JVD, no Abnormal Mass Or lumps Cardiovascular: S1 and S2 Present, no Murmur, peripheral pulses symmetrical Respiratory: good respiratory effort, Bilateral Air entry equal and Decreased, no signs of accessory muscle use, Clear to Auscultation, no Crackles, no wheezes Abdomen: Bowel Sound present, Soft and no tenderness, no hernia Skin: no rashes  Extremities: no Pedal edema, no calf tenderness Neurologic: without any new focal findings Gait not checked due to patient safety concerns  Data Reviewed: I have personally reviewed and interpreted daily labs, tele strips, imagings as discussed above. I reviewed all nursing notes, pharmacy notes, vitals, pertinent old records I have discussed plan of care as described above with RN and patient/family.  CBC: Recent Labs  Lab 09/06/19 1422 09/07/19 0429  WBC 3.3* 4.8  NEUTROABS 2.0  --   HGB 12.7 12.5  HCT 36.7 37.6  MCV 95.3 97.7  PLT 180 [id number]   Basic Metabolic Panel: Recent Labs  Lab 09/06/19 1422 09/07/19 0429  NA 132* 131*  K 4.0 3.9  CL 93* 94*  CO2 29 26  GLUCOSE 98 135*  BUN 16 22  CREATININE 0.82 0.79  CALCIUM 9.4 8.6*    Liver Function Tests: No results for input(s): AST, ALT, ALKPHOS, BILITOT, PROT, ALBUMIN in the last 168 hours. No results for input(s): LIPASE, AMYLASE in the last 168 hours. No results for input(s): AMMONIA in the last 168 hours. Coagulation Profile: Recent Labs  Lab 09/06/19 1422  INR 0.9   Cardiac Enzymes: No results for input(s): CKTOTAL, CKMB, CKMBINDEX, TROPONINI in the last 168 hours. BNP (last 3 results) No results for input(s): PROBNP in the last 8760 hours. CBG: No results for input(s): GLUCAP in the last 168 hours. Studies: Dg C-arm 1-60  Min-no Report  Result Date: 09/07/2019 Fluoroscopy was utilized by the requesting physician.  No radiographic interpretation.     Time spent: 35 minutes  Author: Berle Mull, MD [facility] 09/07/2019 4:28 PM  To reach On-call, see care teams to locate the attending and reach out to them via [email or web address]. If 7PM-7AM, please contact night-coverage If you still have difficulty reaching the attending provider, please page the [facility] (Director on Call) for [facility] on amion for assistance.

## 2019-09-07 NOTE — Anesthesia Procedure Notes (Signed)
Spinal  Patient location during procedure: OR Start time: 09/07/2019 1:39 PM End time: 09/07/2019 1:46 PM Staffing Anesthesiologist: Oleta Mouse, MD Performed: anesthesiologist  Preanesthetic Checklist Completed: patient identified, surgical consent, pre-op evaluation, timeout performed, IV checked, risks and benefits discussed and monitors and equipment checked Spinal Block Patient position: left lateral decubitus Prep: DuraPrep Patient monitoring: heart rate, cardiac monitor, continuous pulse ox and blood pressure Approach: midline Location: L5-S1 Injection technique: single-shot Needle Needle type: Pencan  Needle gauge: 24 G Needle length: 9 cm Assessment Sensory level: T6

## 2019-09-07 NOTE — Discharge Instructions (Signed)
°Dr. Callahan Wild °Joint Replacement Specialist °[facility] °[address] °[place], [place] [address] °[phone] ° ° °TOTAL HIP REPLACEMENT POSTOPERATIVE DIRECTIONS ° ° ° °Hip Rehabilitation, Guidelines Following Surgery  ° °WEIGHT BEARING °Weight bearing as tolerated with assist device (walker, cane, etc) as directed, use it as long as suggested by your surgeon or therapist, typically at least 4-6 weeks. ° °The results of a hip operation are greatly improved after range of motion and muscle strengthening exercises. Follow all safety measures which are given to protect your hip. If any of these exercises cause increased pain or swelling in your joint, decrease the amount until you are comfortable again. Then slowly increase the exercises. Call your caregiver if you have problems or questions.  ° °HOME CARE INSTRUCTIONS  °Most of the following instructions are designed to prevent the dislocation of your new hip.  °Remove items at home which could result in a fall. This includes throw rugs or furniture in walking pathways.  °Continue medications as instructed at time of discharge. °· You may have some home medications which will be placed on hold until you complete the course of blood thinner medication. °· You may start showering once you are discharged home. Do not remove your dressing. °Do not put on socks or shoes without following the instructions of your caregivers.   °Sit on chairs with arms. Use the chair arms to help push yourself up when arising.  °Arrange for the use of a toilet seat elevator so you are not sitting low.  °· Walk with walker as instructed.  °You may resume a sexual relationship in one month or when given the OK by your caregiver.  °Use walker as long as suggested by your caregivers.  °You may put full weight on your legs and walk as much as is comfortable. °Avoid periods of inactivity such as sitting longer than an hour when not asleep. This helps prevent  blood clots.  °You may return to work once you are cleared by your surgeon.  °Do not drive a car for 6 weeks or until released by your surgeon.  °Do not drive while taking narcotics.  °Wear elastic stockings for two weeks following surgery during the day but you may remove then at night.  °Make sure you keep all of your appointments after your operation with all of your doctors and caregivers. You should call the office at the above phone number and make an appointment for approximately two weeks after the date of your surgery. °Please pick up a stool softener and laxative for home use as long as you are requiring pain medications. °· ICE to the affected hip every three hours for 30 minutes at a time and then as needed for pain and swelling. Continue to use ice on the hip for pain and swelling from surgery. You may notice swelling that will progress down to the foot and ankle.  This is normal after surgery.  Elevate the leg when you are not up walking on it.   °It is important for you to complete the blood thinner medication as prescribed by your doctor. °· Continue to use the breathing machine which will help keep your temperature down.  It is common for your temperature to cycle up and down following surgery, especially at night when you are not up moving around and exerting yourself.  The breathing machine keeps your lungs expanded and your temperature down. ° °RANGE OF MOTION AND STRENGTHENING EXERCISES  °These exercises are   designed to help you keep full movement of your hip joint. Follow your caregiver's or physical therapist's instructions. Perform all exercises about fifteen times, three times per day or as directed. Exercise both hips, even if you have had only one joint replacement. These exercises can be done on a training (exercise) mat, on the floor, on a table or on a bed. Use whatever works the best and is most comfortable for you. Use music or television while you are exercising so that the exercises  are a pleasant break in your day. This will make your life better with the exercises acting as a break in routine you can look forward to.  °Lying on your back, slowly slide your foot toward your buttocks, raising your knee up off the floor. Then slowly slide your foot back down until your leg is straight again.  °Lying on your back spread your legs as far apart as you can without causing discomfort.  °Lying on your side, raise your upper leg and foot straight up from the floor as far as is comfortable. Slowly lower the leg and repeat.  °Lying on your back, tighten up the muscle in the front of your thigh (quadriceps muscles). You can do this by keeping your leg straight and trying to raise your heel off the floor. This helps strengthen the largest muscle supporting your knee.  °Lying on your back, tighten up the muscles of your buttocks both with the legs straight and with the knee bent at a comfortable angle while keeping your heel on the floor.  ° °SKILLED REHAB INSTRUCTIONS: °If the patient is transferred to a skilled rehab facility following release from the hospital, a list of the current medications will be sent to the facility for the patient to continue.  When discharged from the skilled rehab facility, please have the facility set up the patient's Home Health Physical Therapy prior to being released. Also, the skilled facility will be responsible for providing the patient with their medications at time of release from the facility to include their pain medication and their blood thinner medication. If the patient is still at the rehab facility at time of the two week follow up appointment, the skilled rehab facility will also need to assist the patient in arranging follow up appointment in our office and any transportation needs. ° °MAKE SURE YOU:  °Understand these instructions.  °Will watch your condition.  °Will get help right away if you are not doing well or get worse. ° °Pick up stool softner and  laxative for home use following surgery while on pain medications. °Do not remove your dressing. °The dressing is waterproof--it is OK to take showers. °Continue to use ice for pain and swelling after surgery. °Do not use any lotions or creams on the incision until instructed by your surgeon. °Total Hip Protocol. ° ° °

## 2019-09-07 NOTE — Progress Notes (Signed)
Oral temp 94.7. Heat packs and warm blankets applied. Patient A&O X4. Rechecked temp rectally 95.7. Patient in no acute distress. Body feels warm to touch. Patient skin color is pink. More warm blankets and heat packs applied. NP on call notified.

## 2019-09-07 NOTE — Progress Notes (Signed)
PT Cancellation Note  Patient Details Name: [name] MRN: [id number] DOB: 26-Oct-1956   Cancelled Treatment:    Reason Eval/Treat Not Completed: Medical issues which prohibited therapy Pt presents with displaced left femoral neck fracture after mechanical fall. Plan is for left total hip replacement via anterior approach.  Will await surgery.  Please reorder after surgery. Thanks!   Kennia Vanvorst,KATHrine E 09/07/2019, 8:45 AM Carmelia Bake, PT, DPT Acute Rehabilitation Services Office: [phone] Pager: [phone]

## 2019-09-07 NOTE — Plan of Care (Signed)
°  Problem: Education: °Goal: Knowledge of General Education information will improve °Description: Including pain rating scale, medication(s)/side effects and non-pharmacologic comfort measures °Outcome: Progressing °  °Problem: Elimination: °Goal: Will not experience complications related to bowel motility °Outcome: Progressing °Goal: Will not experience complications related to urinary retention °Outcome: Progressing °  °Problem: Pain Managment: °Goal: General experience of comfort will improve °Outcome: Progressing °  °

## 2019-09-07 NOTE — Interval H&P Note (Signed)
History and Physical Interval Note:  09/07/2019 1:26 PM  [name]  has presented today for surgery, with the diagnosis of LEFT FEMORAL NECK FRACTURE.  The various methods of treatment have been discussed with the patient and family. After consideration of risks, benefits and other options for treatment, the patient has consented to  Procedure(s): TOTAL HIP ARTHROPLASTY ANTERIOR APPROACH (Left) as a surgical intervention.  The patient's history has been reviewed, patient examined, no change in status, stable for surgery.  I have reviewed the patient's chart and labs.  Questions were answered to the patient's satisfaction.    The risks, benefits, and alternatives were discussed with the patient. There are risks associated with the surgery including, but not limited to, problems with anesthesia (death), infection, instability (giving out of the joint), dislocation, differences in leg length/angulation/rotation, fracture of bones, loosening or failure of implants, hematoma (blood accumulation) which may require surgical drainage, blood clots, pulmonary embolism, nerve injury (foot drop and lateral thigh numbness), and blood vessel injury. The patient understands these risks and elects to proceed.   Hilton Cork Duaine Radin

## 2019-09-07 NOTE — Op Note (Signed)
OPERATIVE REPORT  SURGEON: Rod Can, MD   ASSISTANT: Nehemiah Massed, PA-C.  PREOPERATIVE DIAGNOSIS: Displaced Left femoral neck fracture.   POSTOPERATIVE DIAGNOSIS: Displaced Left femoral neck fracture.   PROCEDURE: Left total hip arthroplasty, anterior approach.   IMPLANTS: Stryker Accolade II stem, size 5, 132 degree neck angle. Stryker Trident II Tritanium [employer], size 48 mm. Stryker X3 liner, size 32 mm, D, +6 neutral. Stryker Biolox ceramic V40 head ball, size 32 + -4 mm.  ANESTHESIA:  Spinal  ANTIBIOTICS: 2g ancef.  ESTIMATED BLOOD LOSS:-300 mL    DRAINS: None.  COMPLICATIONS: None   CONDITION: PACU - hemodynamically stable.   BRIEF CLINICAL NOTE: [name] is a 63 y.o. female with a displaced Left femoral neck fracture. The patient was admitted to the hospitalist service and underwent perioperative risk stratification and medical optimization. The risks, benefits, and alternatives to total hip arthroplasty were explained, and the patient elected to proceed.  PROCEDURE IN DETAIL: The patient was taken to the operating room and general anesthesia was induced on the hospital bed.  The patient was then positioned on the Hana table.  All bony prominences were well padded.  The hip was prepped and draped in the normal sterile surgical fashion.  A time-out was called verifying side and site of surgery. Antibiotics were given within 60 minutes of beginning the procedure.  The direct anterior approach to the hip was performed through the Hueter interval.  Lateral femoral circumflex vessels were treated with the Auqumantys. The anterior capsule was exposed and an inverted T capsulotomy was made.  Fracture hematoma was encountered and evacuated. The patient was found to have a comminuted Left subcapital femoral neck fracture.  I freshened the femoral neck cut with a saw.  I removed the femoral neck fragment.  A corkscrew was placed into the head and the head  was removed.  This was passed to the back table and was measured.   Acetabular exposure was achieved, and the pulvinar and labrum were excised. Sequential reaming of the acetabulum was then performed up to a size 47 mm reamer. A 48 mm cup was then opened and impacted into place at approximately 40 degrees of abduction and 20 degrees of anteversion. The final polyethylene liner was impacted into place.    I then gained femoral exposure taking care to protect the abductors and greater trochanter.  This was performed using standard external rotation, extension, and adduction.  The capsule was peeled off the inner aspect of the greater trochanter, taking care to preserve the short external rotators. A cookie cutter was used to enter the femoral canal, and then the femoral canal finder was used to confirm location.  I then sequentially broached up to a size 5.  Calcar planer was used on the femoral neck remnant.  I paced a 132 degree neck and a trial head ball. The hip was reduced.  Leg lengths were checked fluoroscopically.  The hip was dislocated and trial components were removed.  I placed the real stem followed by the real spacer and head ball.  The hip was reduced.  Fluoroscopy was used to confirm component position and leg lengths.  At 90 degrees of external rotation and extension, the hip was stable to an anterior directed force.   The wound was copiously irrigated with Irrisept solution and normal saline using pule lavage.  Marcaine solution was injected into the periarticular soft tissue.  The wound was closed in layers using #1 Vicryl and V-Loc for the  fascia, 2-0 Vicryl for the subcutaneous fat, 2-0 Monocryl for the deep dermal layer, 3-0 running Monocryl subcuticular stitch and glue for the skin.  Once the glue was fully dried, an Aquacell Ag dressing was applied.  The patient was then awakened from anesthesia and transported to the recovery room in stable condition.  Sponge, needle, and instrument  counts were correct at the end of the case x2.  The patient tolerated the procedure well and there were no known complications.  Please note that a surgical assistant was a medical necessity for this procedure to perform it in a safe and expeditious manner. Assistant was necessary to provide appropriate retraction of vital neurovascular structures, to prevent femoral fracture, and to allow for anatomic placement of the prosthesis.

## 2019-09-07 NOTE — Consult Note (Addendum)
Cardiology Consultation:   Patient ID: MINERVA BOEDER MRN: [id number]; DOB: [birth year]  Admit date: 09/06/2019 Date of Consult: 09/07/2019  Primary Care Provider: Aretta Nip, MD Primary Cardiologist/ Electrophysiologist:  Constance Haw, MD    Patient Profile:   [name] is a 63 y.o. 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 Posey Pronto.  Patient was initially evaluated by Dr. Alvester Chou 03/2017 for chest pain and near syncope.  She had developed ventricular tachycardia during GXT.  The treadmill was stopped and her VT resolved.  Then she had 1 mm ST depression in inferior leads concerning for ischemia.  Follow-up left heart cath showed normal coronaries.  Echocardiogram with normal LV function and no wall motion/valvular abnormality.  Cardiac MRI was normal.  she was started on flecainide and Lopressor.  Then she had normal GXT on flecainide.  Beta-blocker discontinued secondary to fatigue.  Flecainide changed to propafenone secondary to recurrent palpitation and near syncope.  She was last evaluated by Dr. Curt Bears 02/2019 telemetry medicine secondary to COVID-19 pandemic.  Noted some fatigue in the morning.  Her diltiazem changed to nighttime.  History of Present Illness:   Ms. [name] presented to hospital yesterday after mechanical fall.  She was walking on her driveway.  Witnessed by husband.  No LOC.  Work-up revealed left hip fracture.  Cardiology is consulted for surgical clearance.  Patient reported longstanding history of exertional dizziness with multiple fall.  No LOC.  She takes Onfi and Depakote for seizure disorder.  She reports her heart rate never above 60.  She walks 1 mile every day without chest pain or shortness of breath.  She denies orthopnea, PND, syncope, lower extremity edema or melena.  Potassium 3.9.   Lactic acid 0.9.  COVID-19 negative.  Heart Pathway Score:     Past Medical History:  Diagnosis Date   Arthritis lower back/hands   Brain injury ([name])    Cancer ([name]) breast tumor removed   Leg fracture    Seizure disorder ([name])    Seizures ([place])    VT (ventricular tachycardia) ([name]) 04/29/2017    Past Surgical History:  Procedure Laterality Date   BREAST SURGERY     FACIAL FRACTURE SURGERY  orbital bone repair   LEFT HEART CATH AND CORONARY ANGIOGRAPHY N/A 04/30/2017   Procedure: Left Heart Cath and Coronary Angiography;  Surgeon: Leonie Man, MD;  Location: [facility];  Service: Cardiovascular;  Laterality: N/A;     Inpatient Medications: Scheduled Meds:  cloBAZam  10 mg Oral Daily   cloBAZam  15 mg Oral QHS   diltiazem  120 mg Oral Daily   divalproex  1,000 mg Oral QHS   enoxaparin (LOVENOX) injection  40 mg Subcutaneous Q24H   mouth rinse  15 mL Mouth Rinse BID   povidone-iodine  2 application Topical Once   propafenone  225 mg Oral Q12H   Continuous Infusions:   ceFAZolin (ANCEF) IV     tranexamic acid     PRN Meds: HYDROcodone-acetaminophen, HYDROmorphone (DILAUDID) injection, morphine injection  Allergies:   No Known Allergies  Social History:   Social History   Socioeconomic History   Marital status: Married    Spouse name: Not on file   Number of children: Not on file   Years of education: Not on file   Highest education level: Not on  file  Occupational History   Not on file  Social Needs   Financial resource strain: Not on file   Food insecurity    Worry: Not on file    Inability: Not on file   Transportation needs    Medical: Not on file    Non-medical: Not on file  Tobacco Use   Smoking status: Never Smoker   Smokeless tobacco: Never Used  Substance and Sexual Activity   Alcohol use: No   Drug use: No   Sexual activity: Not on file  Lifestyle   Physical activity    Days per week: Not on file      Minutes per session: Not on file   Stress: Not on file  Relationships   Social connections    Talks on phone: Not on file    Gets together: Not on file    Attends religious service: Not on file    Active member of club or organization: Not on file    Attends meetings of clubs or organizations: Not on file    Relationship status: Not on file   Intimate partner violence    Fear of current or ex partner: Not on file    Emotionally abused: Not on file    Physically abused: Not on file    Forced sexual activity: Not on file  Other Topics Concern   Not on file  Social History Narrative   Not on file    Family History:   Family History  Problem Relation Age of Onset   Heart failure Mother    Heart disease Mother    Pancreatic cancer Father      ROS:  Please see the history of present illness.  All other ROS reviewed and negative.     Physical Exam/Data:   Vitals:   09/06/19 1901 09/07/19 0403 09/07/19 0615 09/07/19 0706  BP: 136/60 130/80    Pulse: (!) 58 (!) 55    Resp: 14 16    Temp: 97.6 F (36.4 C) (!) 94.7 F (34.8 C) (!) 95.7 F (35.4 C) (!) 97.5 F (36.4 C)  TempSrc: Oral Oral Rectal Oral  SpO2: 96% 97%    Weight: 61.6 kg     Height: 5\' 3"  (1.6 m)       Intake/Output Summary (Last 24 hours) at 09/07/2019 0809 Last data filed at 09/07/2019 0500 Gross per 24 hour  Intake 300 ml  Output 650 ml  Net -350 ml   Last 3 Weights 09/06/2019 03/21/2019 09/14/2018  Weight (lbs) 135 lb 12.9 oz 134 lb 135 lb  Weight (kg) 61.6 kg 60.782 kg 61.236 kg     Body mass index is 24.06 kg/m.  General:  Well nourished, well developed, in no acute distress HEENT: normal Lymph: no adenopathy Neck: no JVD Endocrine:  No thryomegaly Vascular: No carotid bruits; FA pulses 2+ bilaterally without bruits  Cardiac:  normal S1, S2; regular slow; no murmur  Lungs:  clear to auscultation bilaterally, no wheezing, rhonchi or rales  Abd: soft, nontender, no hepatomegaly  Ext:  no edema Musculoskeletal:  No deformities Skin: warm and dry  Neuro:  CNs 2-12 intact, no focal abnormalities noted Psych:  Normal affect   EKG:  The EKG was personally reviewed and demonstrates: Sinus bradycardia at rate of 59 bpm, nonspecific T wave changes which is chronic Telemetry:  Telemetry was personally reviewed and demonstrates: Sinus bradycardia at rate of 50s  Relevant CV Studies:  GXT 04/29/2017  Clinically negative and electrically  positive for ischemia with sustained VT at peak exercise which resolved with rest    Left Heart Cath and Coronary Angiography  04/30/2017  Conclusion    The left ventricular systolic function is normal. The left ventricular ejection fraction is greater than 65% by visual estimate.  LV end diastolic pressure is normal. 11 mmHg  Angiographically normal coronary arteries   Angiographic normal coronary arteries with normal LV function and EDP. Suspect electrical etiology for sustained VT.  Plan: Return to nursing unit for TR band removal and ongoing care. Anticipate EP consult   Echo 05/02/2017 Study Conclusions  - Left ventricle: The cavity size was normal. Systolic function was   normal. The estimated ejection fraction was in the range of 60%   to 65%. Wall motion was normal; there were no regional wall   motion abnormalities. - Aortic valve: Trileaflet; mildly thickened, mildly calcified   leaflets. - Mitral valve: There was trivial regurgitation. - Right atrium: The atrium was mildly dilated. - Tricuspid valve: There was trivial regurgitation.  Cardiac MRI 05/01/2017 IMPRESSION: 1) Normal LV size and function EF 75%  2) Normal RV size and function no evidence of RV dysplasia  3) No delayed gadolinium enhancement in LV myocardium on inversion recovery sequences  4) Normal AV, MV and TV  5) Prominent epicardial fat pad  Exercise tolerance test 05/11/2014  Blood pressure demonstrated a normal response to  exercise.  No T wave inversion was noted during stress.  There was no ST segment deviation noted during stress.  The patient requested the test to be stopped.  Heart rate demonstrated pharmacologically blunted response to exercise  Overall, the patient's exercise capacity was normal.  [facility] Treadmill Score: low risk   Negative stress test without evidence of ischemia at given workload.   Laboratory Data:  Chemistry Recent Labs  Lab 09/06/19 1422 09/07/19 0429  NA 132* 131*  K 4.0 3.9  CL 93* 94*  CO2 29 26  GLUCOSE 98 135*  BUN 16 22  CREATININE 0.82 0.79  CALCIUM 9.4 8.6*  GFRNONAA >60 >60  GFRAA >60 >60  ANIONGAP 10 11    Hematology Recent Labs  Lab 09/06/19 1422 09/07/19 0429  WBC 3.3* 4.8  RBC 3.85* 3.85*  HGB 12.7 12.5  HCT 36.7 37.6  MCV 95.3 97.7  MCH 33.0 32.5  MCHC 34.6 33.2  RDW 12.8 12.9  PLT 180 157   Radiology/Studies:  Dg Chest Port 1 View  Result Date: 09/06/2019 CLINICAL DATA:  Preop hip fracture EXAM: PORTABLE CHEST 1 VIEW COMPARISON:  03/28/2017 FINDINGS: The heart size and mediastinal contours are within normal limits. Both lungs are clear. The visualized skeletal structures are unremarkable. Previously noted left apical nodularity is less apparent. IMPRESSION: No active disease. Electronically Signed   By: Donavan Foil M.D.   On: 09/06/2019 15:08   Dg Knee Left Port  Result Date: 09/06/2019 CLINICAL DATA:  Fall with thigh pain EXAM: PORTABLE LEFT KNEE - 1-2 VIEW COMPARISON:  None. FINDINGS: No fracture or malalignment. Minimal patellofemoral degenerative change. No large knee effusion IMPRESSION: No acute osseous abnormality. Electronically Signed   By: Donavan Foil M.D.   On: 09/06/2019 16:18   Dg Hip Unilat With Pelvis 2-3 Views Left  Result Date: 09/06/2019 CLINICAL DATA:  63 year old female with history of trauma from a fall complaining of left-sided hip pain. EXAM: DG HIP (WITH OR WITHOUT PELVIS) 2-3V LEFT COMPARISON:  No priors.  FINDINGS: Transcervical left femoral neck fracture with 2.1 cm proximal  migration of the distal fracture fragment. Femoral head remains in the left acetabulum. Bony pelvis and right proximal femur otherwise appear intact. IMPRESSION: 1. Displaced transcervical left femoral neck fracture, as above. Electronically Signed   By: Vinnie Langton M.D.   On: 09/06/2019 14:21    Assessment and Plan:   1. Exercise-induced VT -No evidence of CAD by cath 04/2017.  Normal cardiac MRI 04/2017. -Did not tolerated beta-blocker secondary to fatigue and flecainide secondary to recurrent palpitations/near syncope. -Currently on long acting Cardizem and propafenone.  2.  Exertional dizziness/frequent falls -Longstanding issue.  This could be due to her seizure medications> defer to primary team/neurology. See below.   3.  Sinus bradycardia -Patient reports her heart rate never above 60.  This may lead to her dizziness and falls if significant bradycardia.  No LOC.  Continue current dose of Cardizem CD and propafenone.  Follow-up with Dr. Curt Bears as outpatient.  May consider monitor as outpatient.  4.  Surgical clearance for left hip replacement -Patient denies chest pain or shortness of breath with walking (1 miles each day).  She is easily getting greater than 4 METS of activity. Dr. Johnsie Cancel to see this morning.   For questions or updates, please contact [place] Please consult [email or web address] for contact info under   Signed, Leanor Kail, PA  09/07/2019 8:09 AM   Patient examined chart reviewed Resting comfortably post femoral neck fracture She has no CAD and normal EF Reasonable activity level Rhythm is NSR no recurrent VT on medication. Exam benign except for femoral neck fracture on left Can monitor on telemetry Clear to have surgery with general anesthesia this afternoon  Jenkins Rouge

## 2019-09-07 NOTE — Anesthesia Preprocedure Evaluation (Signed)
Anesthesia Evaluation  Patient identified by MRN, date of birth, ID band Patient awake    Reviewed: Allergy & Precautions, NPO status , Patient's Chart, lab work & pertinent test results  Airway Mallampati: II  TM Distance: >3 FB Neck ROM: Full    Dental  (+) Dental Advisory Given   Pulmonary neg pulmonary ROS,    breath sounds clear to auscultation       Cardiovascular negative cardio ROS   Rhythm:Regular     Neuro/Psych Seizures -, Well Controlled,  negative psych ROS   GI/Hepatic Neg liver ROS, GERD  Medicated and Controlled,  Endo/Other  negative endocrine ROS  Renal/GU negative Renal ROS  negative genitourinary   Musculoskeletal  (+) Arthritis ,   Abdominal   Peds negative pediatric ROS (+)  Hematology negative hematology ROS (+)   Anesthesia Other Findings inr 0.9 plt 157  Reproductive/Obstetrics negative OB ROS                             Anesthesia Physical Anesthesia Plan  ASA: II  Anesthesia Plan: MAC and Spinal   Post-op Pain Management:    Induction: Intravenous  PONV Risk Score and Plan: 2 and Treatment may vary due to age or medical condition and Propofol infusion  Airway Management Planned: Nasal Cannula  Additional Equipment: None  Intra-op Plan:   Post-operative Plan: Extubation in OR  Informed Consent: I have reviewed the patients History and Physical, chart, labs and discussed the procedure including the risks, benefits and alternatives for the proposed anesthesia with the patient or authorized representative who has indicated his/her understanding and acceptance.     Dental advisory given  Plan Discussed with: CRNA and Surgeon  Anesthesia Plan Comments:         Anesthesia Quick Evaluation

## 2019-09-07 NOTE — TOC Initial Note (Signed)
Transition of Care [facility]) - Initial/Assessment Note    Patient Details  Name: [name] MRN: [id number] Date of Birth: 1956-03-11  Transition of Care [facility]) CM/SW Contact:    Purcell Mouton, RN Phone Number: 09/07/2019, 11:27 AM  Clinical Narrative:                 Will wait on surgery, pt will need a RW, 3 N 1/BSC.   Expected Discharge Plan: [place]     Patient Goals and CMS Choice Patient states their goals for this hospitalization and ongoing recovery are:: Pt plan to get better to go home. Will wait for surgery.   Choice offered to / list presented to : Patient  Expected Discharge Plan and Services Expected Discharge Plan: [place]     Post Acute Care Choice: Durable Medical Equipment, Home Health Living arrangements for the past 2 months: Single Family Home Expected Discharge Date: (unknown)                                    Prior Living Arrangements/Services Living arrangements for the past 2 months: Single Family Home Lives with:: Spouse Patient language and need for interpreter reviewed:: No                 Activities of Daily Living Home Assistive Devices/Equipment: Eyeglasses ADL Screening (condition at time of admission) Patient's cognitive ability adequate to safely complete daily activities?: Yes Is the patient deaf or have difficulty hearing?: No Does the patient have difficulty seeing, even when wearing glasses/contacts?: No Does the patient have difficulty concentrating, remembering, or making decisions?: No Patient able to express need for assistance with ADLs?: Yes Does the patient have difficulty dressing or bathing?: No Independently performs ADLs?: Yes (appropriate for developmental age) Does the patient have difficulty walking or climbing stairs?: Yes(secondary to left thigh pain) Weakness of Legs: None Weakness of Arms/Hands: None  Permission Sought/Granted Permission sought to  share information with : Case Manager                Emotional Assessment Appearance:: Appears stated age   Affect (typically observed): Accepting Orientation: : Oriented to Self, Oriented to Place, Oriented to  Time, Oriented to Situation      Admission diagnosis:  Displaced fracture of left femoral neck (HCC) [S72.002A] Closed left hip fracture, initial encounter [facility]) [S72.002A] Patient Active Problem List   Diagnosis Date Noted  . Hip fracture ([place]) 09/06/2019  . Seizure disorder ([place])   . Sustained ventricular tachycardia ([place]) 04/29/2017  . Atypical chest pain 04/15/2017  . Localization-related (focal) (partial) symptomatic epilepsy and epileptic syndromes with complex partial seizures, not intractable, without status epilepticus ([place]) 02/10/2016   PCP:  Aretta Nip, MD Pharmacy:   [id number] [address], [place] [address] Phone: [phone] Fax: [phone]     Social Determinants of Health (SDOH) Interventions    Readmission Risk Interventions No flowsheet data found.

## 2019-09-07 NOTE — Anesthesia Postprocedure Evaluation (Signed)
Anesthesia Post Note  Patient: [name]  Procedure(s) Performed: TOTAL HIP ARTHROPLASTY ANTERIOR APPROACH (Left Hip)     Patient location during evaluation: PACU Anesthesia Type: Spinal Level of consciousness: awake and alert Pain management: pain level controlled Vital Signs Assessment: post-procedure vital signs reviewed and stable Respiratory status: spontaneous breathing and respiratory function stable Cardiovascular status: blood pressure returned to baseline and stable Postop Assessment: spinal receding and no apparent nausea or vomiting Anesthetic complications: no Comments: Prolonged hypotension following spinal. Patient asymptomatic despite low BP. Albumin provided, decadron per IM for low cortisol level. Phenylephrine eventually weaned off.    Last Vitals:  Vitals:   09/07/19 1730 09/07/19 1735  BP: (!) 91/49   Pulse: (!) 54 (!) 58  Resp: 10 19  Temp:    SpO2: 100% 100%    Last Pain:  Vitals:   09/07/19 1730  TempSrc:   PainSc: (P) Asleep    LLE Motor Response: (P) Purposeful movement;Responds to commands (09/07/19 1730) LLE Sensation: (P) Numbness (09/07/19 1730) RLE Motor Response: (P) Purposeful movement;Responds to commands (09/07/19 1730) RLE Sensation: (P) Numbness (09/07/19 1730)      Audry Pili

## 2019-09-07 NOTE — Transfer of Care (Signed)
Immediate Anesthesia Transfer of Care Note  Patient: [name]  Procedure(s) Performed: TOTAL HIP ARTHROPLASTY ANTERIOR APPROACH (Left Hip)  Patient Location: PACU  Anesthesia Type:Spinal  Level of Consciousness: drowsy  Airway & Oxygen Therapy: Patient Spontanous Breathing and Patient connected to face mask  Post-op Assessment: Report given to RN and Post -op Vital signs reviewed and stable  Post vital signs: Reviewed and stable  Last Vitals:  Vitals Value Taken Time  BP    Temp    Pulse 54 09/07/19 1600  Resp 11 09/07/19 1600  SpO2 100 % 09/07/19 1600  Vitals shown include unvalidated device data.  Last Pain:  Vitals:   09/07/19 1311  TempSrc:   PainSc: 8       Patients Stated Pain Goal: 4 ([id number])  Complications: No apparent anesthesia complications

## 2019-09-08 ENCOUNTER — Inpatient Hospital Stay (HOSPITAL_COMMUNITY): Payer: BC Managed Care – PPO

## 2019-09-08 DIAGNOSIS — I361 Nonrheumatic tricuspid (valve) insufficiency: Secondary | ICD-10-CM

## 2019-09-08 LAB — CBC WITH DIFFERENTIAL/PLATELET
Abs Immature Granulocytes: 0.04 10*3/uL (ref 0.00–0.07)
Basophils Absolute: 0 10*3/uL (ref 0.0–0.1)
Basophils Relative: 0 %
Eosinophils Absolute: 0 10*3/uL (ref 0.0–0.5)
Eosinophils Relative: 0 %
HCT: 22.7 % — ABNORMAL LOW (ref 36.0–46.0)
Hemoglobin: 7.7 g/dL — ABNORMAL LOW (ref 12.0–15.0)
Immature Granulocytes: 1 %
Lymphocytes Relative: 4 %
Lymphs Abs: 0.3 10*3/uL — ABNORMAL LOW (ref 0.7–4.0)
MCH: 33.3 pg (ref 26.0–34.0)
MCHC: 33.9 g/dL (ref 30.0–36.0)
MCV: 98.3 fL (ref 80.0–100.0)
Monocytes Absolute: 0.4 10*3/uL (ref 0.1–1.0)
Monocytes Relative: 5 %
Neutro Abs: 7.1 10*3/uL (ref 1.7–7.7)
Neutrophils Relative %: 90 %
Platelets: 117 10*3/uL — ABNORMAL LOW (ref 150–400)
RBC: 2.31 MIL/uL — ABNORMAL LOW (ref 3.87–5.11)
RDW: 12.7 % (ref 11.5–15.5)
WBC: 7.9 10*3/uL (ref 4.0–10.5)
nRBC: 0 % (ref 0.0–0.2)

## 2019-09-08 LAB — LACTATE DEHYDROGENASE: LDH: 121 U/L (ref 98–192)

## 2019-09-08 LAB — URINALYSIS, COMPLETE (UACMP) WITH MICROSCOPIC
Bacteria, UA: NONE SEEN
Bilirubin Urine: NEGATIVE
Glucose, UA: 50 mg/dL — AB
Hgb urine dipstick: NEGATIVE
Ketones, ur: NEGATIVE mg/dL
Leukocytes,Ua: NEGATIVE
Nitrite: NEGATIVE
Protein, ur: NEGATIVE mg/dL
Specific Gravity, Urine: 1.009 (ref 1.005–1.030)
pH: 5 (ref 5.0–8.0)

## 2019-09-08 LAB — COMPREHENSIVE METABOLIC PANEL
ALT: 14 U/L (ref 0–44)
AST: 24 U/L (ref 15–41)
Albumin: 2.5 g/dL — ABNORMAL LOW (ref 3.5–5.0)
Alkaline Phosphatase: 51 U/L (ref 38–126)
Anion gap: 9 (ref 5–15)
BUN: 19 mg/dL (ref 8–23)
CO2: 24 mmol/L (ref 22–32)
Calcium: 7.7 mg/dL — ABNORMAL LOW (ref 8.9–10.3)
Chloride: 95 mmol/L — ABNORMAL LOW (ref 98–111)
Creatinine, Ser: 0.94 mg/dL (ref 0.44–1.00)
GFR calc Af Amer: >60 mL/min (ref >60)
GFR calc non Af Amer: >60 mL/min (ref >60)
Glucose, Bld: 165 mg/dL — ABNORMAL HIGH (ref 70–99)
Potassium: 4.7 mmol/L (ref 3.5–5.1)
Sodium: 128 mmol/L — ABNORMAL LOW (ref 135–145)
Total Bilirubin: 0.3 mg/dL (ref 0.3–1.2)
Total Protein: 4.5 g/dL — ABNORMAL LOW (ref 6.5–8.1)

## 2019-09-08 LAB — LACTIC ACID, PLASMA
Lactic Acid, Venous: 1.1 mmol/L (ref 0.5–1.9)
Lactic Acid, Venous: 2.6 mmol/L (ref 0.5–1.9)

## 2019-09-08 LAB — TECHNOLOGIST SMEAR REVIEW: Plt Morphology: NONE SEEN

## 2019-09-08 LAB — ECHOCARDIOGRAM COMPLETE
Height: 63 in
Weight: 2172.85 oz

## 2019-09-08 LAB — CBC
HCT: 21.6 % — ABNORMAL LOW (ref 36.0–46.0)
Hemoglobin: 7.3 g/dL — ABNORMAL LOW (ref 12.0–15.0)
MCH: 32.9 pg (ref 26.0–34.0)
MCHC: 33.8 g/dL (ref 30.0–36.0)
MCV: 97.3 fL (ref 80.0–100.0)
Platelets: 113 10*3/uL — ABNORMAL LOW (ref 150–400)
RBC: 2.22 MIL/uL — ABNORMAL LOW (ref 3.87–5.11)
RDW: 12.7 % (ref 11.5–15.5)
WBC: 7.9 10*3/uL (ref 4.0–10.5)
nRBC: 0 % (ref 0.0–0.2)

## 2019-09-08 LAB — RETICULOCYTES
Immature Retic Fract: 9.6 % (ref 2.3–15.9)
RBC.: 2.22 MIL/uL — ABNORMAL LOW (ref 3.87–5.11)
Retic Count, Absolute: 52.4 10*3/uL (ref 19.0–186.0)
Retic Ct Pct: 2.4 % (ref 0.4–3.1)

## 2019-09-08 LAB — MAGNESIUM: Magnesium: 1.7 mg/dL (ref 1.7–2.4)

## 2019-09-08 LAB — NA AND K (SODIUM & POTASSIUM), RAND UR
Potassium Urine: 24 mmol/L
Sodium, Ur: 28 mmol/L

## 2019-09-08 LAB — DIRECT ANTIGLOBULIN TEST (NOT AT ARMC)
DAT, IgG: NEGATIVE
DAT, complement: NEGATIVE

## 2019-09-08 LAB — FIBRINOGEN: Fibrinogen: 278 mg/dL (ref 210–475)

## 2019-09-08 LAB — HEMOGLOBIN AND HEMATOCRIT, BLOOD
HCT: 25.9 % — ABNORMAL LOW (ref 36.0–46.0)
Hemoglobin: 9 g/dL — ABNORMAL LOW (ref 12.0–15.0)

## 2019-09-08 LAB — OSMOLALITY, URINE: Osmolality, Ur: 355 mOsm/kg (ref 300–900)

## 2019-09-08 LAB — CREATININE, URINE, RANDOM: Creatinine, Urine: 33.81 mg/dL

## 2019-09-08 LAB — VALPROIC ACID LEVEL: Valproic Acid Lvl: 67 ug/mL (ref 50.0–100.0)

## 2019-09-08 LAB — AMMONIA: Ammonia: 55 umol/L — ABNORMAL HIGH (ref 9–35)

## 2019-09-08 LAB — OSMOLALITY: Osmolality: 275 mOsm/kg (ref 275–295)

## 2019-09-08 LAB — PREPARE RBC (CROSSMATCH)

## 2019-09-08 MED ORDER — SODIUM CHLORIDE 0.9 % IV BOLUS
250.0000 mL | Freq: Once | INTRAVENOUS | Status: AC
  Administered 2019-09-08: 250 mL via INTRAVENOUS

## 2019-09-08 MED ORDER — HYDROCODONE-ACETAMINOPHEN 5-325 MG PO TABS: 1.0000 | ORAL_TABLET | Freq: Four times a day (QID) | ORAL | 0 refills | Status: DC | PRN

## 2019-09-08 MED ORDER — ASPIRIN 81 MG PO CHEW: 81.0000 mg | CHEWABLE_TABLET | Freq: Two times a day (BID) | ORAL | 1 refills | Status: AC

## 2019-09-08 MED ORDER — SODIUM CHLORIDE 0.9 % IV SOLN
INTRAVENOUS | Status: DC
  Administered 2019-09-08 (×2): via INTRAVENOUS

## 2019-09-08 MED ORDER — SODIUM CHLORIDE 0.9% IV SOLUTION
Freq: Once | INTRAVENOUS | Status: AC
  Administered 2019-09-08: 17:00:00 via INTRAVENOUS

## 2019-09-08 MED ORDER — COSYNTROPIN 0.25 MG IJ SOLR
0.2500 mg | Freq: Once | INTRAMUSCULAR | Status: AC
  Administered 2019-09-09: 0.25 mg via INTRAVENOUS
  Filled 2019-09-08: qty 0.25

## 2019-09-08 MED ORDER — SODIUM CHLORIDE 0.9 % IV BOLUS
500.0000 mL | Freq: Once | INTRAVENOUS | Status: AC
  Administered 2019-09-08: 500 mL via INTRAVENOUS

## 2019-09-08 MED ORDER — ALBUMIN HUMAN 5 % IV SOLN
12.5000 g | Freq: Once | INTRAVENOUS | Status: DC
  Filled 2019-09-08: qty 250

## 2019-09-08 MED ORDER — ALBUMIN HUMAN 5 % IV SOLN
12.5000 g | Freq: Once | INTRAVENOUS | Status: DC
  Filled 2019-09-08 (×2): qty 250

## 2019-09-08 NOTE — Progress Notes (Signed)
  Echocardiogram 2D Echocardiogram has been performed.  Jannett Celestine 09/08/2019, 2:52 PM

## 2019-09-08 NOTE — Progress Notes (Signed)
PT Cancellation Note  Patient Details Name: [name] MRN: [id number] DOB: [birth year]   Cancelled Treatment:    Reason Eval/Treat Not Completed: Medical issues which prohibited therapy. Checked on pt multiple time today. Pt with hypotension in supine and critical lab values.  Pt with some confusion at times.  Nursing attending to pt.  Will check back tomorrow and nurse in agreement.   Galen Manila 09/08/2019, 1:35 PM

## 2019-09-08 NOTE — Progress Notes (Signed)
On Call provider is notified of Patient's BP. Patient is alert and oriented. Not in distress. Monitoring closely.

## 2019-09-08 NOTE — Progress Notes (Signed)
Subjective:  Denies SSCP, palpitations or Dyspnea Face timing friend   Objective:  Vitals:   09/07/19 1808 09/07/19 2038 09/08/19 0520 09/08/19 0611  BP: 97/60 (!) 95/56 (!) 83/45 (!) 94/54  Pulse: 61 63 69 70  Resp: 14 18 18    Temp: 97.6 F (36.4 C) 97.6 F (36.4 C) 98.5 F (36.9 C)   TempSrc: Oral Oral Oral   SpO2: 100% 100% 98%   Weight:      Height:        Intake/Output from previous day:  Intake/Output Summary (Last 24 hours) at 09/08/2019 0810 Last data filed at 09/08/2019 0600 Gross per 24 hour  Intake 1370 ml  Output 2250 ml  Net -880 ml    Physical Exam: Affect appropriate Healthy:  appears stated age HEENT: normal Neck supple with no adenopathy JVP normal no bruits no thyromegaly Lungs clear with no wheezing and good diaphragmatic motion Heart:  S1/S2 no murmur, no rub, gallop or click PMI normal Abdomen: benighn, BS positve, no tenderness, no AAA no bruit.  No HSM or HJR Distal pulses intact with no bruits No edema Neuro non-focal Skin warm and dry Post left femoral neck fracture repair    Lab Results: Basic Metabolic Panel: Recent Labs    09/06/19 1422 09/07/19 0429 09/08/19 0425  NA 132* 131*  --   K 4.0 3.9  --   CL 93* 94*  --   CO2 29 26  --   GLUCOSE 98 135*  --   BUN 16 22  --   CREATININE 0.82 0.79  --   CALCIUM 9.4 8.6*  --   MG  --   --  1.7   Liver Function Tests: No results for input(s): AST, ALT, ALKPHOS, BILITOT, PROT, ALBUMIN in the last 72 hours. No results for input(s): LIPASE, AMYLASE in the last 72 hours. CBC: Recent Labs    09/06/19 1422 09/07/19 0429  WBC 3.3* 4.8  NEUTROABS 2.0  --   HGB 12.7 12.5  HCT 36.7 37.6  MCV 95.3 97.7  PLT 180 157   Thyroid Function Tests: Recent Labs    09/07/19 0829  TSH 4.230    Imaging: Pelvis Portable  Result Date: 09/07/2019 CLINICAL DATA:  62 year old female status post left hip replacement. EXAM: PORTABLE PELVIS 1-2 VIEWS COMPARISON:  Intraoperative  fluoroscopy dated 09/07/2019 FINDINGS: There is a total left hip arthroplasty. The arthroplasty components appear intact and in anatomic alignment. There is no acute fracture or dislocation. Mild arthritic changes of the right hip. There is mild osteopenia. Degenerative changes of the SI joints. The soft tissues are unremarkable. IMPRESSION: Total left hip arthroplasty appears intact and in anatomic alignment. Electronically Signed   By: Anner Crete M.D.   On: 09/07/2019 18:03   Dg Chest Port 1 View  Result Date: 09/06/2019 CLINICAL DATA:  Preop hip fracture EXAM: PORTABLE CHEST 1 VIEW COMPARISON:  03/28/2017 FINDINGS: The heart size and mediastinal contours are within normal limits. Both lungs are clear. The visualized skeletal structures are unremarkable. Previously noted left apical nodularity is less apparent. IMPRESSION: No active disease. Electronically Signed   By: Donavan Foil M.D.   On: 09/06/2019 15:08   Dg Knee Left Port  Result Date: 09/06/2019 CLINICAL DATA:  Fall with thigh pain EXAM: PORTABLE LEFT KNEE - 1-2 VIEW COMPARISON:  None. FINDINGS: No fracture or malalignment. Minimal patellofemoral degenerative change. No large knee effusion IMPRESSION: No acute osseous abnormality. Electronically Signed   By: Donavan Foil  M.D.   On: 09/06/2019 16:18   Dg C-arm 1-60 Min-no Report  Result Date: 09/07/2019 Fluoroscopy was utilized by the requesting physician.  No radiographic interpretation.   Dg Hip Operative Unilat W Or W/o Pelvis Left  Result Date: 09/07/2019 CLINICAL DATA:  Left hip replacement. EXAM: OPERATIVE left HIP (WITH PELVIS IF PERFORMED) 6 VIEWS TECHNIQUE: Fluoroscopic spot image(s) were submitted for interpretation post-operatively. FLUOROSCOPY TIME:  14 seconds. COMPARISON:  Radiographs of September 06, 2019. FINDINGS: Six intraoperative fluoroscopic images demonstrate placement of left total hip arthroplasty. The left femoral and acetabular components appear to be well  situated. IMPRESSION: Fluoroscopic guidance provided during left total hip arthroplasty. Electronically Signed   By: Marijo Conception M.D.   On: 09/07/2019 17:02   Dg Hip Unilat With Pelvis 2-3 Views Left  Result Date: 09/06/2019 CLINICAL DATA:  63 year old female with history of trauma from a fall complaining of left-sided hip pain. EXAM: DG HIP (WITH OR WITHOUT PELVIS) 2-3V LEFT COMPARISON:  No priors. FINDINGS: Transcervical left femoral neck fracture with 2.1 cm proximal migration of the distal fracture fragment. Femoral head remains in the left acetabulum. Bony pelvis and right proximal femur otherwise appear intact. IMPRESSION: 1. Displaced transcervical left femoral neck fracture, as above. Electronically Signed   By: Vinnie Langton M.D.   On: 09/06/2019 14:21    Cardiac Studies:  ECG: SR QT 439   Telemetry: NSR no significant arrhythmia   Echo:   Medications:   . cloBAZam  10 mg Oral Daily  . cloBAZam  15 mg Oral QHS  . dexamethasone (DECADRON) injection  4 mg Intravenous Q6H  . diltiazem  120 mg Oral Daily  . divalproex  1,000 mg Oral QHS  . docusate sodium  100 mg Oral BID  . enoxaparin (LOVENOX) injection  40 mg Subcutaneous Q24H  . mouth rinse  15 mL Mouth Rinse BID  . propafenone  225 mg Oral Q12H  . senna  1 tablet Oral BID      Assessment/Plan:   1. 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

## 2019-09-08 NOTE — Progress Notes (Signed)
[facility] Progress Note  Patient: [name] [id number]   PCP: Aretta Nip, MD DOB: Apr 11, 1956   DOA: 09/06/2019   DOS: 09/08/2019   Date of Service: the patient was seen and examined on 09/08/2019  Chief Complaint  Patient presents with  . Fall   Brief hospital course: Pt. with PMH of SVT; presented with complain of fall, was found to have left hip fracture.  Underwent left hip arthroplasty on 09/07/2019  Currently further plan is correction of hypotension.  Subjective: Denies any acute complaint.  Reports pain in the left hip not controlled with medication.  No nausea no vomiting.  No further hypothermia.  No diarrhea.  Patient remains hypotensive postprocedure.  No fever no chills.  No active bleeding reported.  Assessment and Plan: 1.  Left hip fracture. Orthopedic consulted. S/P left total hip arthroplasty anterior approach September 07 2019. Cardiology was consulted patient is cleared for surgery from cardiology point of view. Tolerated the procedure very well. Pain remains well controlled. No hematoma or bleeding at the surgical site. No nausea or vomiting. Suspect postop delirium. PT OT consulted but unable to work with the patient.  2.  History of SVT. Cardiology consulted, appreciate their assistance. Continue propafenone. Currently hypotensive therefore holding Cardizem. Also rate well controlled.  3.  Hypothermia. ?  Hypotension Low cortisol level/?  Adrenal crisis Etiology not clear. No evidence of active infection. Procalcitonin negative, lactic acid negative, TSH normal. Cortisol is relatively low. Lab was unable to perform cosyntropin stimulation test as a stipulated. Echocardiogram showed preserved EF.  No significant valvular abnormality. We will cancel test today and repeat ordered labs for tomorrow. Given 1 dose of IV albumin. Continue LR. Continue Decadron.  4.  Seizure disorder. Reported dizziness. Patient is chronically on  Depakote and clobazam. Depakote level and ammonia level are relatively normal. No further work-up  5.  Postop acute blood loss anemia. Hemoglobin dropped from 12-7. No further bleeding. No hematoma.  No acute bleeding. No evidence of hemolysis. Ferritin normal as well. Reticulocyte count normal. Will transfuse 1 PRBC. Continue DVT prophylaxis. Monitor  6.  Hyponatremia. Sodium 128. We will order further work-up. Likely secondary to multiple rounds of IV fluid bolus.  7.  Postop delirium. Patient is somewhat confused likely secondary to anesthesia. We will monitor. Given her hypotension can also be explained by anesthesia monitor.  Diet: Regular diet  DVT Prophylaxis: Subcutaneous Lovenox  Advance goals of care discussion: none  Family Communication: family was present at bedside, at the time of interview. The pt provided permission to discuss medical plan with the family. Opportunity was given to ask question and all questions were answered satisfactorily.   Disposition:  Discharge to be determined .  Consultants: orthopedics  Procedures: Left hip arthroplasty  Scheduled Meds: . sodium chloride   Intravenous Once  . cloBAZam  10 mg Oral Daily  . cloBAZam  15 mg Oral QHS  . [START ON 09/09/2019] cosyntropin  0.25 mg Intravenous Once  . dexamethasone (DECADRON) injection  4 mg Intravenous Q6H  . divalproex  1,000 mg Oral QHS  . docusate sodium  100 mg Oral BID  . enoxaparin (LOVENOX) injection  40 mg Subcutaneous Q24H  . mouth rinse  15 mL Mouth Rinse BID  . propafenone  225 mg Oral Q12H  . senna  1 tablet Oral BID   Continuous Infusions: . sodium chloride 100 mL/hr at 09/08/19 1415  . albumin human     PRN Meds: HYDROcodone-acetaminophen, menthol-cetylpyridinium **OR**  phenol, metoCLOPramide **OR** metoCLOPramide (REGLAN) injection, morphine injection, ondansetron **OR** ondansetron (ZOFRAN) IV Antibiotics: Anti-infectives (From admission, onward)   Start      Dose/Rate Route Frequency Ordered Stop   09/07/19 2000  ceFAZolin (ANCEF) IVPB 2g/100 mL premix     2 g 200 mL/hr over 30 Minutes Intravenous Every 6 hours 09/07/19 1823 09/08/19 0322   09/07/19 0600  ceFAZolin (ANCEF) IVPB 2g/100 mL premix     2 g 200 mL/hr over 30 Minutes Intravenous On call to O.R. 09/06/19 1949 09/07/19 1359   09/07/19 0600  ceFAZolin (ANCEF) 3 g in dextrose 5 % 50 mL IVPB  Status:  Discontinued     3 g 100 mL/hr over 30 Minutes Intravenous On call to O.R. 09/06/19 1949 09/06/19 1956       Objective: Physical Exam: Vitals:   09/08/19 0952 09/08/19 1141 09/08/19 1319 09/08/19 1500  BP: (!) 86/51 (!) 78/45 (!) 92/53 115/60  Pulse: 74 72 75 76  Resp: 16 18 12 15   Temp: 98.4 F (36.9 C) 98.6 F (37 C) 99.3 F (37.4 C) 98.6 F (37 C)  TempSrc: Oral Oral Oral Oral  SpO2: 93% 94% 96% 95%  Weight:      Height:        Intake/Output Summary (Last 24 hours) at 09/08/2019 1742 Last data filed at 09/08/2019 1400 Gross per 24 hour  Intake 580 ml  Output 900 ml  Net -320 ml   Filed Weights   09/06/19 1901  Weight: 61.6 kg   General: alert and oriented to time, place, and person. Appear in mild distress, affect appropriate Eyes: PERRL, Conjunctiva normal ENT: Oral Mucosa Clear, moist  Neck: no JVD, no Abnormal Mass Or lumps Cardiovascular: S1 and S2 Present, no Murmur, peripheral pulses symmetrical Respiratory: good respiratory effort, Bilateral Air entry equal and Decreased, no signs of accessory muscle use, Clear to Auscultation, no Crackles, no wheezes Abdomen: Bowel Sound present, Soft and no tenderness, no hernia Skin: no rashes  Extremities: no Pedal edema, no calf tenderness Neurologic: without any new focal findings Gait not checked due to patient safety concerns  Data Reviewed: I have personally reviewed and interpreted daily labs, tele strips, imagings as discussed above. I reviewed all nursing notes, pharmacy notes, vitals, pertinent old  records I have discussed plan of care as described above with RN and patient/family.  CBC: Recent Labs  Lab 09/06/19 1422 09/07/19 0429 09/08/19 1243 09/08/19 1604  WBC 3.3* 4.8 7.9 7.9  NEUTROABS 2.0  --  7.1  --   HGB 12.7 12.5 7.7* 7.3*  HCT 36.7 37.6 22.7* 21.6*  MCV 95.3 97.7 98.3 97.3  PLT 180 157 117* [id number]*   Basic Metabolic Panel: Recent Labs  Lab 09/06/19 1422 09/07/19 0429 09/08/19 0425 09/08/19 1243  NA 132* 131*  --  128*  K 4.0 3.9  --  4.7  CL 93* 94*  --  95*  CO2 29 26  --  24  GLUCOSE 98 135*  --  165*  BUN 16 22  --  19  CREATININE 0.82 0.79  --  0.94  CALCIUM 9.4 8.6*  --  7.7*  MG  --   --  1.7  --     Liver Function Tests: Recent Labs  Lab 09/08/19 1243  AST 24  ALT 14  ALKPHOS 51  BILITOT 0.3  PROT 4.5*  ALBUMIN 2.5*   No results for input(s): LIPASE, AMYLASE in the last 168 hours. Recent Labs  Lab 09/08/19  0440  AMMONIA 55*   Coagulation Profile: Recent Labs  Lab 09/06/19 1422  INR 0.9   Cardiac Enzymes: No results for input(s): CKTOTAL, CKMB, CKMBINDEX, TROPONINI in the last 168 hours. BNP (last 3 results) No results for input(s): PROBNP in the last 8760 hours. CBG: No results for input(s): GLUCAP in the last 168 hours. Studies: No results found.   Time spent: 35 minutes  Author: Berle Mull, MD [facility] 09/08/2019 5:42 PM  To reach On-call, see care teams to locate the attending and reach out to them via [email or web address]. If 7PM-7AM, please contact night-coverage If you still have difficulty reaching the attending provider, please page the [facility] (Director on Call) for [facility] on amion for assistance.

## 2019-09-08 NOTE — Progress Notes (Signed)
Critical lab value of Lactic 2.6 reported to me by lab. MD notified. Will continue to monitor.

## 2019-09-08 NOTE — Plan of Care (Signed)
  Problem: Education: Goal: Knowledge of General Education information will improve Description Including pain rating scale, medication(s)/side effects and non-pharmacologic comfort measures Outcome: Progressing   

## 2019-09-08 NOTE — Progress Notes (Signed)
    Subjective:  Patient reports pain as mild to moderate.  Denies N/V/CP/SOB.   Objective:   VITALS:   Vitals:   09/08/19 0611 09/08/19 0835 09/08/19 0952 09/08/19 1141  BP: (!) 94/54 (!) 72/46 (!) 86/51 (!) 78/45  Pulse: 70 71 74 72  Resp:   16 18  Temp:   98.4 F (36.9 C) 98.6 F (37 C)  TempSrc:   Oral Oral  SpO2:   93% 94%  Weight:      Height:        NAD ABD soft Sensation intact distally Intact pulses distally Dorsiflexion/Plantar flexion intact Incision: dressing C/D/I Compartment soft   Lab Results  Component Value Date   WBC 4.8 09/07/2019   HGB 12.5 09/07/2019   HCT 37.6 09/07/2019   MCV 97.7 09/07/2019   PLT 157 09/07/2019   BMET    Component Value Date/Time   NA 131 (L) 09/07/2019 0429   K 3.9 09/07/2019 0429   CL 94 (L) 09/07/2019 0429   CO2 26 09/07/2019 0429   GLUCOSE 135 (H) 09/07/2019 0429   BUN 22 09/07/2019 0429   CREATININE 0.79 09/07/2019 0429   CALCIUM 8.6 (L) 09/07/2019 0429   GFRNONAA >60 09/07/2019 0429   GFRAA >60 09/07/2019 0429     Assessment/Plan: 1 Day Post-Op   Active Problems:   Hip fracture (HCC)   WBAT with walker DVT ppx: Lovenox, SCDs, TEDS PO pain control PT/OT hypotension per primary team Dispo: D/C planning   [name] 09/08/2019, 12:15 PM   Rod Can, MD Cell: [phone] [place] [facility] now [facility]  [facility] [address], [place], [place] [address] Phone: [phone] [email or web address] Facebook  [employer]

## 2019-09-09 DIAGNOSIS — I472 Ventricular tachycardia: Secondary | ICD-10-CM

## 2019-09-09 LAB — BASIC METABOLIC PANEL
Anion gap: 6 (ref 5–15)
BUN: 16 mg/dL (ref 8–23)
CO2: 29 mmol/L (ref 22–32)
Calcium: 8.1 mg/dL — ABNORMAL LOW (ref 8.9–10.3)
Chloride: 98 mmol/L (ref 98–111)
Creatinine, Ser: 0.59 mg/dL (ref 0.44–1.00)
GFR calc Af Amer: >60 mL/min (ref >60)
GFR calc non Af Amer: >60 mL/min (ref >60)
Glucose, Bld: 117 mg/dL — ABNORMAL HIGH (ref 70–99)
Potassium: 4.5 mmol/L (ref 3.5–5.1)
Sodium: 133 mmol/L — ABNORMAL LOW (ref 135–145)

## 2019-09-09 LAB — CBC
HCT: 26.5 % — ABNORMAL LOW (ref 36.0–46.0)
Hemoglobin: 9.3 g/dL — ABNORMAL LOW (ref 12.0–15.0)
MCH: 33.6 pg (ref 26.0–34.0)
MCHC: 35.1 g/dL (ref 30.0–36.0)
MCV: 95.7 fL (ref 80.0–100.0)
Platelets: 109 10*3/uL — ABNORMAL LOW (ref 150–400)
RBC: 2.77 MIL/uL — ABNORMAL LOW (ref 3.87–5.11)
RDW: 13.2 % (ref 11.5–15.5)
WBC: 9.5 10*3/uL (ref 4.0–10.5)
nRBC: 0 % (ref 0.0–0.2)

## 2019-09-09 LAB — ACTH STIMULATION, 3 TIME POINTS
Cortisol, 30 Min: 17.1 ug/dL
Cortisol, 60 Min: 22.4 ug/dL
Cortisol, Base: 1 ug/dL

## 2019-09-09 LAB — HAPTOGLOBIN: Haptoglobin: 97 mg/dL (ref 37–355)

## 2019-09-09 MED ORDER — DEXAMETHASONE SODIUM PHOSPHATE 4 MG/ML IJ SOLN: 4.0000 mg | Freq: Two times a day (BID) | INTRAMUSCULAR | Status: DC

## 2019-09-09 MED ORDER — DILTIAZEM HCL ER COATED BEADS 120 MG PO CP24
120.0000 mg | ORAL_CAPSULE | Freq: Every day | ORAL | Status: DC
  Administered 2019-09-09 – 2019-09-10 (×2): 120 mg via ORAL
  Filled 2019-09-09 (×2): qty 1

## 2019-09-09 MED ORDER — DEXAMETHASONE SODIUM PHOSPHATE 4 MG/ML IJ SOLN
4.0000 mg | Freq: Two times a day (BID) | INTRAMUSCULAR | Status: AC
  Administered 2019-09-09: 4 mg via INTRAVENOUS
  Filled 2019-09-09: qty 1

## 2019-09-09 MED ORDER — ACETAMINOPHEN 325 MG PO TABS
650.0000 mg | ORAL_TABLET | Freq: Four times a day (QID) | ORAL | Status: DC | PRN
  Administered 2019-09-10: 650 mg via ORAL
  Filled 2019-09-09: qty 2

## 2019-09-09 NOTE — Evaluation (Signed)
Physical Therapy Evaluation Patient Details Name: [name] MRN: [id number] DOB: 22-Jul-1956 Today's Date: 09/09/2019   History of Present Illness  L hip fx, s/p DA-THA; PMH of seizures, brain injury  Clinical Impression  Pt is s/p THA resulting in the deficits listed below (see PT Problem List). Pt ambulated 59' with RW and frequent verbal cues for sequencing and positioning in RW. Initiated THA HEP. Good progress expected.  Pt will benefit from skilled PT to increase their independence and safety with mobility to allow discharge to the venue listed below.      Follow Up Recommendations Follow surgeon's recommendation for DC plan and follow-up therapies    Equipment Recommendations  Rolling walker with 5" wheels;3in1 (PT)    Recommendations for Other Services       Precautions / Restrictions Precautions Precautions: Fall Restrictions Weight Bearing Restrictions: No Other Position/Activity Restrictions: WBAT      Mobility  Bed Mobility               General bed mobility comments: up in recliner  Transfers Overall transfer level: Needs assistance Equipment used: Rolling walker (2 wheeled) Transfers: Sit to/from Stand Sit to Stand: Min assist         General transfer comment: VCs hand placement, min A to rise  Ambulation/Gait Ambulation/Gait assistance: Min guard Gait Distance (Feet): 60 Feet Assistive device: Rolling walker (2 wheeled) Gait Pattern/deviations: Step-to pattern;Step-through pattern;Decreased stride length;Trunk flexed Gait velocity: decr   General Gait Details: VCs for sequencing and for posture  Stairs            Wheelchair Mobility    Modified Rankin (Stroke Patients Only)       Balance Overall balance assessment: Modified Independent                                           Pertinent Vitals/Pain Pain Assessment: 0-10 Pain Score: 3  Pain Location: L hip with walking Pain Descriptors / Indicators:  Sore Pain Intervention(s): Limited activity within patient's tolerance;Monitored during session;Patient requesting pain meds-RN notified;Ice applied    Home Living Family/patient expects to be discharged to:: Private residence Living Arrangements: Spouse/significant other Available Help at Discharge: Family;Available 24 hours/day   Home Access: Stairs to enter   Entrance Stairs-Number of Steps: 1 Home Layout: Multi-level;Bed/bath upstairs Home Equipment: None      Prior Function Level of Independence: Independent               Hand Dominance        Extremity/Trunk Assessment   Upper Extremity Assessment Upper Extremity Assessment: Overall WFL for tasks assessed    Lower Extremity Assessment Lower Extremity Assessment: LLE deficits/detail LLE Deficits / Details: knee ext -4/5, hip +3/5 LLE Sensation: WNL LLE Coordination: WNL    Cervical / Trunk Assessment Cervical / Trunk Assessment: Normal  Communication   Communication: No difficulties  Cognition Arousal/Alertness: Awake/alert Behavior During Therapy: WFL for tasks assessed/performed Overall Cognitive Status: No family/caregiver present to determine baseline cognitive functioning                                 General Comments: pt seems to have some memory deficits, noted h/o "brain injury" in H&P, can follow commands with frequent reminders      General Comments      Exercises  Total Joint Exercises Ankle Circles/Pumps: AROM;Both;10 reps;Supine Heel Slides: AAROM;Left;10 reps;Supine Hip ABduction/ADduction: AAROM;Left;10 reps;Supine   Assessment/Plan    PT Assessment Patient needs continued PT services  PT Problem List Decreased strength;Decreased activity tolerance;Pain;Decreased mobility       PT Treatment Interventions DME instruction;Gait training;Stair training;Functional mobility training;Therapeutic exercise;Therapeutic activities;Patient/family education    PT Goals  (Current goals can be found in the Care Plan section)  Acute Rehab PT Goals Patient Stated Goal: walk her dog PT Goal Formulation: With patient Time For Goal Achievement: 09/16/19 Potential to Achieve Goals: Good    Frequency 7X/week   Barriers to discharge        Co-evaluation               AM-PAC PT "6 Clicks" Mobility  Outcome Measure Help needed turning from your back to your side while in a flat bed without using bedrails?: A Little Help needed moving from lying on your back to sitting on the side of a flat bed without using bedrails?: A Little Help needed moving to and from a bed to a chair (including a wheelchair)?: A Little Help needed standing up from a chair using your arms (e.g., wheelchair or bedside chair)?: A Little Help needed to walk in hospital room?: A Little Help needed climbing 3-5 steps with a railing? : A Lot 6 Click Score: 17    End of Session Equipment Utilized During Treatment: Gait belt Activity Tolerance: Patient tolerated treatment well Patient left: in chair;with call bell/phone within reach;with family/visitor present Nurse Communication: Mobility status PT Visit Diagnosis: Difficulty in walking, not elsewhere classified (R26.2);Pain Pain - Right/Left: Left Pain - part of body: Hip    Time: [id number] PT Time Calculation (min) (ACUTE ONLY): 21 min   Charges:   PT Evaluation $PT Eval Low Complexity: 1 Low          Blondell Reveal Kistler PT 09/09/2019  Acute Rehabilitation Services Pager [phone] Office [phone]

## 2019-09-09 NOTE — Progress Notes (Signed)
[facility] Progress Note  Patient: [name] [id number]   PCP: Aretta Nip, MD DOB: 1956/03/10   DOA: 09/06/2019   DOS: 09/09/2019   Date of Service: the patient was seen and examined on 09/09/2019  Chief Complaint  Patient presents with  . Fall   Brief hospital course: Pt. with PMH of SVT; presented with complain of fall, was found to have left hip fracture.  Underwent left hip arthroplasty on 09/07/2019.  Postop the patient was hypotensive requiring multiple rounds of fluid bolus. Patient also require 1 PRBC. Currently further plan is monitoring recommendation from therapy and arranging discharge planning.  Subjective: No acute complaint no nausea no vomiting no fever no chills.  No constipation.  No blood in the stool.  No blood in the urine.  Pain remains well controlled.  Patient is able to ambulate without any complaints with therapy. No dizziness no lightheadedness reported by the patient.  Assessment and Plan: 1.  Left hip fracture. Orthopedic consulted. S/P left total hip arthroplasty anterior approach September 07 2019. Cardiology was consulted, patient is cleared for surgery from cardiology point of view. Tolerated the procedure very well. Pain remains well controlled. No hematoma or bleeding at the surgical site. No nausea or vomiting. PT OT and orthopedic are finalizing recommendation from discharge planning. Pain management and postop DVT prophylaxis per orthopedics.  2.  History of SVT. Cardiology consulted, appreciate their assistance. Continue propafenone. Due to severe hypotension patient's Cardizem was held. Now that the blood pressure has improved we will resume Cardizem and monitor. Telemetry shows no evidence of SVTs.  3.  Hypothermia. ?  Hypotension Low cortisol level/?  Adrenal crisis Etiology not clear. No evidence of active infection. Procalcitonin negative, lactic acid negative, TSH normal. Cortisol is relatively  low. Cosyntropin stimulation test is negative. Patient's baseline cortisol level was significantly low but the patient had a robust response to cosyntropin stimulation test. Potentially her baseline cortisol level is low secondary to her being on Decadron. I will reduce the dose of Decadron and stop it from tomorrow. Echocardiogram showed preserved EF.  No significant valvular abnormality. Also discontinue fluid. Monitor on telemetry. Orthostatic vitals tomorrow morning.  4.  Seizure disorder. Reported dizziness. Patient is chronically on Depakote and clobazam. Depakote level and ammonia level are relatively normal. No further work-up  5.  Postop acute blood loss anemia. Hemoglobin dropped from 12-7. No further bleeding. No hematoma.  No acute bleeding. No evidence of hemolysis. Ferritin normal as well. Reticulocyte count normal. Patient S/P 1 PRBC transfusion on 09/08/2019. Hemoglobin x2 post transfusion remained stable. No evidence of hematoma no evidence of active bleeding. Continue to monitor. May require iron supplementation on discharge.  6.  Hyponatremia.  Resolved Likely secondary to aggressive IV hydration. Currently sodium level back to normal. Monitor.  7.  Postop delirium.  Resolved Continue to monitor.  Diet: Regular diet  DVT Prophylaxis: Subcutaneous Lovenox  Advance goals of care discussion: none  Family Communication: family was present at bedside, at the time of interview. The pt provided permission to discuss medical plan with the family. Opportunity was given to ask question and all questions were answered satisfactorily.   Disposition:  Discharge to be determined by orthopedics and PT  Consultants: orthopedics  Procedures: Left hip arthroplasty Echocardiogram  Scheduled Meds: . cloBAZam  10 mg Oral Daily  . cloBAZam  15 mg Oral QHS  . dexamethasone (DECADRON) injection  4 mg Intravenous Q12H  . diltiazem  120 mg Oral Daily  .  divalproex   1,000 mg Oral QHS  . enoxaparin (LOVENOX) injection  40 mg Subcutaneous Q24H  . mouth rinse  15 mL Mouth Rinse BID  . propafenone  225 mg Oral Q12H   Continuous Infusions:  PRN Meds: acetaminophen, HYDROcodone-acetaminophen, menthol-cetylpyridinium **OR** phenol, morphine injection, ondansetron **OR** ondansetron (ZOFRAN) IV Antibiotics: Anti-infectives (From admission, onward)   Start     Dose/Rate Route Frequency Ordered Stop   09/07/19 2000  ceFAZolin (ANCEF) IVPB 2g/100 mL premix     2 g 200 mL/hr over 30 Minutes Intravenous Every 6 hours 09/07/19 1823 09/08/19 0322   09/07/19 0600  ceFAZolin (ANCEF) IVPB 2g/100 mL premix     2 g 200 mL/hr over 30 Minutes Intravenous On call to O.R. 09/06/19 1949 09/07/19 1359   09/07/19 0600  ceFAZolin (ANCEF) 3 g in dextrose 5 % 50 mL IVPB  Status:  Discontinued     3 g 100 mL/hr over 30 Minutes Intravenous On call to O.R. 09/06/19 1949 09/06/19 1956       Objective: Physical Exam: Vitals:   09/08/19 2118 09/08/19 2124 09/09/19 0500 09/09/19 1323  BP: 137/72 137/72 133/71 105/62  Pulse: 83 83 83 78  Resp: 18  18 14   Temp: 98.4 F (36.9 C)  98.7 F (37.1 C) 98.3 F (36.8 C)  TempSrc: Oral  Oral Oral  SpO2:  98% 95% 96%  Weight:      Height:        Intake/Output Summary (Last 24 hours) at 09/09/2019 1733 Last data filed at 09/09/2019 1721 Gross per 24 hour  Intake 2561 ml  Output 2900 ml  Net -339 ml   Filed Weights   09/06/19 1901  Weight: 61.6 kg   General: alert and oriented to time, place, and person. Appear in mild distress, affect appropriate Eyes: PERRL, Conjunctiva normal ENT: Oral Mucosa Clear, moist  Neck: no JVD, no Abnormal Mass Or lumps Cardiovascular: S1 and S2 Present, no Murmur, peripheral pulses symmetrical Respiratory: good respiratory effort, Bilateral Air entry equal and Decreased, no signs of accessory muscle use, Clear to Auscultation, no Crackles, no wheezes Abdomen: Bowel Sound present, Soft and no  tenderness, no hernia Skin: no rashes  Extremities: no Pedal edema, no calf tenderness Neurologic: without any new focal findings Gait not checked due to patient safety concerns   Data Reviewed: I have personally reviewed and interpreted daily labs, tele strips, imagings as discussed above. I reviewed all nursing notes, pharmacy notes, vitals, pertinent old records I have discussed plan of care as described above with RN and patient/family.  CBC: Recent Labs  Lab 09/06/19 1422 09/07/19 0429 09/08/19 1243 09/08/19 1604 09/08/19 2317 09/09/19 0906  WBC 3.3* 4.8 7.9 7.9  --  9.5  NEUTROABS 2.0  --  7.1  --   --   --   HGB 12.7 12.5 7.7* 7.3* 9.0* 9.3*  HCT 36.7 37.6 22.7* 21.6* 25.9* 26.5*  MCV 95.3 97.7 98.3 97.3  --  95.7  PLT 180 157 117* 113*  --  0000000*   Basic Metabolic Panel: Recent Labs  Lab 09/06/19 1422 09/07/19 0429 09/08/19 0425 09/08/19 1243 09/09/19 0906  NA 132* 131*  --  128* 133*  K 4.0 3.9  --  4.7 4.5  CL 93* 94*  --  95* 98  CO2 29 26  --  24 29  GLUCOSE 98 135*  --  165* 117*  BUN 16 22  --  19 16  CREATININE 0.82 0.79  --  0.94 0.59  CALCIUM 9.4 8.6*  --  7.7* 8.1*  MG  --   --  1.7  --   --     Liver Function Tests: Recent Labs  Lab 09/08/19 1243  AST 24  ALT 14  ALKPHOS 51  BILITOT 0.3  PROT 4.5*  ALBUMIN 2.5*   No results for input(s): LIPASE, AMYLASE in the last 168 hours. Recent Labs  Lab 09/08/19 0440  AMMONIA 55*   Coagulation Profile: Recent Labs  Lab 09/06/19 1422  INR 0.9   Cardiac Enzymes: No results for input(s): CKTOTAL, CKMB, CKMBINDEX, TROPONINI in the last 168 hours. BNP (last 3 results) No results for input(s): PROBNP in the last 8760 hours. CBG: No results for input(s): GLUCAP in the last 168 hours. Studies: No results found.   Time spent: 35 minutes  Author: Berle Mull, MD [facility] 09/09/2019 5:33 PM  To reach On-call, see care teams to locate the attending and reach out to them via  [email or web address]. If 7PM-7AM, please contact night-coverage If you still have difficulty reaching the attending provider, please page the [facility] (Director on Call) for [facility] on amion for assistance.

## 2019-09-09 NOTE — Progress Notes (Signed)
Cardiology Progress Note  Patient ID: [name] MRN: [id number] DOB: [birth year] Date of Encounter: 09/09/2019  Primary Cardiologist: No primary care provider on file.  Subjective  No complaints this a.m.  Status post 1 unit of packed red blood cells.  Blood pressure this morning noted to be 137/70.  Denies symptoms.  ROS:  All other ROS reviewed and negative. Pertinent positives noted in the HPI.     Inpatient Medications  Scheduled Meds: . cloBAZam  10 mg Oral Daily  . cloBAZam  15 mg Oral QHS  . dexamethasone (DECADRON) injection  4 mg Intravenous Q6H  . divalproex  1,000 mg Oral QHS  . docusate sodium  100 mg Oral BID  . enoxaparin (LOVENOX) injection  40 mg Subcutaneous Q24H  . mouth rinse  15 mL Mouth Rinse BID  . propafenone  225 mg Oral Q12H  . senna  1 tablet Oral BID   Continuous Infusions: . sodium chloride 100 mL/hr at 09/08/19 2119  . albumin human     PRN Meds: HYDROcodone-acetaminophen, menthol-cetylpyridinium **OR** phenol, metoCLOPramide **OR** metoCLOPramide (REGLAN) injection, morphine injection, ondansetron **OR** ondansetron (ZOFRAN) IV   Vital Signs   Vitals:   09/08/19 1845 09/08/19 2118 09/08/19 2124 09/09/19 0500  BP: (!) 107/58 137/72 137/72 133/71  Pulse: 85 83 83 83  Resp: 18 18  18   Temp: 98.7 F (37.1 C) 98.4 F (36.9 C)  98.7 F (37.1 C)  TempSrc: Oral Oral  Oral  SpO2: 96%  98% 95%  Weight:      Height:        Intake/Output Summary (Last 24 hours) at 09/09/2019 0940 Last data filed at 09/09/2019 0445 Gross per 24 hour  Intake 2081 ml  Output 1700 ml  Net 381 ml   Last 3 Weights 09/06/2019 03/21/2019 09/14/2018  Weight (lbs) 135 lb 12.9 oz 134 lb 135 lb  Weight (kg) 61.6 kg 60.782 kg 61.236 kg      Telemetry  Overnight telemetry shows normal sinus rhythm with heart rate in 70s, no arrhythmias, which I personally reviewed.   ECG    Physical Exam   Vitals:   09/08/19 1845 09/08/19 2118 09/08/19 2124 09/09/19 0500  BP:  (!) 107/58 137/72 137/72 133/71  Pulse: 85 83 83 83  Resp: 18 18  18   Temp: 98.7 F (37.1 C) 98.4 F (36.9 C)  98.7 F (37.1 C)  TempSrc: Oral Oral  Oral  SpO2: 96%  98% 95%  Weight:      Height:         Intake/Output Summary (Last 24 hours) at 09/09/2019 0940 Last data filed at 09/09/2019 0445 Gross per 24 hour  Intake 2081 ml  Output 1700 ml  Net 381 ml    Last 3 Weights 09/06/2019 03/21/2019 09/14/2018  Weight (lbs) 135 lb 12.9 oz 134 lb 135 lb  Weight (kg) 61.6 kg 60.782 kg 61.236 kg    Body mass index is 24.06 kg/m.  General: Well nourished, well developed, in no acute distress Heart: Atraumatic, normal size  Eyes: PEERLA, EOMI  Neck: Supple, no JVD Endocrine: No thryomegaly Cardiac: Normal S1, S2; RRR; no murmurs, rubs, or gallops Lungs: Clear to auscultation bilaterally, no wheezing, rhonchi or rales  Abd: Soft, nontender, no hepatomegaly  Ext: No edema, pulses 2+ Musculoskeletal: No deformities, BUE and BLE strength normal and equal Skin: Warm and dry, no rashes   Neuro: Alert and oriented to person, place, time, and situation, CNII-XII grossly intact, no focal deficits  Psych: Normal mood and affect   Labs  High Sensitivity Troponin:  No results for input(s): TROPONINIHS in the last 720 hours.   Cardiac EnzymesNo results for input(s): TROPONINI in the last 168 hours. No results for input(s): TROPIPOC in the last 168 hours.  Chemistry Recent Labs  Lab 09/07/19 0429 09/08/19 1243 09/09/19 0906  NA 131* 128* 133*  K 3.9 4.7 4.5  CL 94* 95* 98  CO2 26 24 29   GLUCOSE 135* 165* 117*  BUN 22 19 16   CREATININE 0.79 0.94 0.59  CALCIUM 8.6* 7.7* 8.1*  PROT  --  4.5*  --   ALBUMIN  --  2.5*  --   AST  --  24  --   ALT  --  14  --   ALKPHOS  --  51  --   BILITOT  --  0.3  --   GFRNONAA >60 >60 >60  GFRAA >60 >60 >60  ANIONGAP 11 9 6     Hematology Recent Labs  Lab 09/07/19 0429 09/08/19 1243 09/08/19 1604 09/08/19 2317  WBC 4.8 7.9 7.9  --   RBC 3.85*  2.31* 2.22*  2.22*  --   HGB 12.5 7.7* 7.3* 9.0*  HCT 37.6 22.7* 21.6* 25.9*  MCV 97.7 98.3 97.3  --   MCH 32.5 33.3 32.9  --   MCHC 33.2 33.9 33.8  --   RDW 12.9 12.7 12.7  --   PLT 157 117* 113*  --    BNPNo results for input(s): BNP, PROBNP in the last 168 hours.  DDimer No results for input(s): DDIMER in the last 168 hours.   Radiology  Pelvis Portable  Result Date: 09/07/2019 CLINICAL DATA:  63 year old female status post left hip replacement. EXAM: PORTABLE PELVIS 1-2 VIEWS COMPARISON:  Intraoperative fluoroscopy dated 09/07/2019 FINDINGS: There is a total left hip arthroplasty. The arthroplasty components appear intact and in anatomic alignment. There is no acute fracture or dislocation. Mild arthritic changes of the right hip. There is mild osteopenia. Degenerative changes of the SI joints. The soft tissues are unremarkable. IMPRESSION: Total left hip arthroplasty appears intact and in anatomic alignment. Electronically Signed   By: Anner Crete M.D.   On: 09/07/2019 18:03   Dg C-arm 1-60 Min-no Report  Result Date: 09/07/2019 Fluoroscopy was utilized by the requesting physician.  No radiographic interpretation.   Dg Hip Operative Unilat W Or W/o Pelvis Left  Result Date: 09/07/2019 CLINICAL DATA:  Left hip replacement. EXAM: OPERATIVE left HIP (WITH PELVIS IF PERFORMED) 6 VIEWS TECHNIQUE: Fluoroscopic spot image(s) were submitted for interpretation post-operatively. FLUOROSCOPY TIME:  14 seconds. COMPARISON:  Radiographs of September 06, 2019. FINDINGS: Six intraoperative fluoroscopic images demonstrate placement of left total hip arthroplasty. The left femoral and acetabular components appear to be well situated. IMPRESSION: Fluoroscopic guidance provided during left total hip arthroplasty. Electronically Signed   By: Marijo Conception M.D.   On: 09/07/2019 17:02    Cardiac Studies  TTE 09/08/2019 1. The left ventricle has normal systolic function, with an ejection fraction of  55-60%. The cavity size was normal. Left ventricular diastolic parameters were normal. No evidence of left ventricular regional wall motion abnormalities.  2. The right ventricle has normal systolic function. The cavity was normal. There is no increase in right ventricular wall thickness. Right ventricular systolic pressure is moderately elevated with an estimated pressure of 45.5 mmHg.  3. The aorta is not well visualized unless otherwise noted.  4. The inferior vena cava was dilated  in size with <50% respiratory variability.  Patient Profile  [name] is a 63 y.o. female with history of exercise-induced ventricular tachycardia (normal cardiac catheterization, normal cardiac MRI), admitted for left hip fracture status post left hip replacement on 09/07/2019.  Cardiology consulted for management of arrhythmia.  Assessment & Plan  1.  Exercise-induced ventricular tachycardia -Extensive normal work-up in the past has been stable prior to admission -We will continue her pro-path known at his current dose -She was hypotensive likely due to acute blood loss anemia from surgery, and her home diltiazem was held -It would be prudent to restart her home diltiazem 120 mg extended release tablet once daily once able as she is on per path and known.  She must be on a AV nodal blocking agent while on this class Ic antiarrhythmic medication.  Given her improvement in blood pressure with 1 unit of packed red blood cells, I think she should be okay to start this today or tomorrow.    For questions or updates, please contact [place] Please consult [email or web address] for contact info under        Signed, [place] T. Audie Box, [place]  09/09/2019 9:40 AM

## 2019-09-09 NOTE — Progress Notes (Signed)
[name]  MRN: [id number] DOB/Age: [birth year] 63 y.o. Physician: Ander Slade, Emily.D. 2 Days Post-Op Procedure(s) (LRB): TOTAL HIP ARTHROPLASTY ANTERIOR APPROACH (Left)  Subjective: Sitting in bedside chair eating breakfast this a.Emily.  Reports minimal hip pain. Vital Signs Temp:  [97.7 F (36.5 C)-99.3 F (37.4 C)] 98.7 F (37.1 C) (09/12 0500) Pulse Rate:  [72-87] 83 (09/12 0500) Resp:  [12-18] 18 (09/12 0500) BP: (78-137)/(45-72) 133/71 (09/12 0500) SpO2:  [93 %-98 %] 95 % (09/12 0500)  Lab Results Recent Labs    09/08/19 1243 09/08/19 1604 09/08/19 2317  WBC 7.9 7.9  --   HGB 7.7* 7.3* 9.0*  HCT 22.7* 21.6* 25.9*  PLT 117* 113*  --    BMET Recent Labs    09/08/19 1243 09/09/19 0906  NA 128* 133*  K 4.7 4.5  CL 95* 98  CO2 24 29  GLUCOSE 165* 117*  BUN 19 16  CREATININE 0.94 0.59  CALCIUM 7.7* 8.1*   INR  Date Value Ref Range Status  09/06/2019 0.9 0.8 - 1.2 Final    Comment:    (NOTE) INR goal varies based on device and disease states. Performed at [facility], [place] [address], [place], [place] [address]      Exam  Left hip dressing is dry.  Minimal thigh tenderness.  Compartments are soft.  She is able to perform knee extension with minimal discomfort.  Neurovascular intact distally.  Plan Continue to mobilize per postop rehab protocol. [name] [name] 09/09/2019, 9:48 AM    Contact # [phone]

## 2019-09-10 LAB — CBC
HCT: 24.4 % — ABNORMAL LOW (ref 36.0–46.0)
Hemoglobin: 8.5 g/dL — ABNORMAL LOW (ref 12.0–15.0)
MCH: 33.7 pg (ref 26.0–34.0)
MCHC: 34.8 g/dL (ref 30.0–36.0)
MCV: 96.8 fL (ref 80.0–100.0)
Platelets: 113 10*3/uL — ABNORMAL LOW (ref 150–400)
RBC: 2.52 MIL/uL — ABNORMAL LOW (ref 3.87–5.11)
RDW: 13.3 % (ref 11.5–15.5)
WBC: 8.1 10*3/uL (ref 4.0–10.5)
nRBC: 0 % (ref 0.0–0.2)

## 2019-09-10 LAB — BASIC METABOLIC PANEL
Anion gap: 7 (ref 5–15)
BUN: 17 mg/dL (ref 8–23)
CO2: 31 mmol/L (ref 22–32)
Calcium: 8.1 mg/dL — ABNORMAL LOW (ref 8.9–10.3)
Chloride: 93 mmol/L — ABNORMAL LOW (ref 98–111)
Creatinine, Ser: 0.6 mg/dL (ref 0.44–1.00)
GFR calc Af Amer: >60 mL/min (ref >60)
GFR calc non Af Amer: >60 mL/min (ref >60)
Glucose, Bld: 98 mg/dL (ref 70–99)
Potassium: 4.4 mmol/L (ref 3.5–5.1)
Sodium: 131 mmol/L — ABNORMAL LOW (ref 135–145)

## 2019-09-10 LAB — TYPE AND SCREEN
ABO/RH(D): O NEG
Antibody Screen: NEGATIVE
Unit division: 0

## 2019-09-10 LAB — URINE CULTURE: Culture: NO GROWTH

## 2019-09-10 LAB — BPAM RBC
Blood Product Expiration Date: 202009292359
ISSUE DATE / TIME: 202009111824
Unit Type and Rh: 9500

## 2019-09-10 LAB — MAGNESIUM: Magnesium: 1.9 mg/dL (ref 1.7–2.4)

## 2019-09-10 NOTE — TOC Progression Note (Addendum)
Transition of Care [facility]) - Progression Note    Patient Details  Name: [name] MRN: [id number] Date of Birth: July 17, 1956  Transition of Care [facility]) CM/SW Contact  Joaquin Courts, RN Phone Number: 09/10/2019, 12:09 PM  Clinical Narrative:    [employer] to deliver rolling walker and 3-in-1 to bedside for home use.  Per MD plan is for outpatient PT.    Expected Discharge Plan: Home/Self Care Barriers to Discharge: No Barriers Identified  Expected Discharge Plan and Services Expected Discharge Plan: Home/Self Care     Post Acute Care Choice: Durable Medical Equipment, Home Health Living arrangements for the past 2 months: Single Family Home Expected Discharge Date: 09/10/19               DME Arranged: Gilford Rile rolling, 3-N-1 DME Agency: [employer] Date DME Agency Contacted: 09/10/19 Time DME Agency Contacted: 1208 Representative spoke with at DME Agency: [place] ([place]) Interventions    Readmission Risk Interventions No flowsheet data found.

## 2019-09-10 NOTE — Progress Notes (Signed)
Physical Therapy Treatment Patient Details Name: [name] MRN: [id number] DOB: December 23, 1956 Today's Date: 09/10/2019    History of Present Illness L hip fx, s/p DA-THA; PMH of seizures, brain injury    PT Comments    Progressing well with mobility. Practiced gait training and stair training this session. All education completed. Okay to d/c from PT standpoint.    Follow Up Recommendations  Follow surgeon's recommendation for DC plan and follow-up therapies     Equipment Recommendations  Rolling walker with 5" wheels;3in1 (PT)    Recommendations for Other Services       Precautions / Restrictions Precautions Precautions: Fall Restrictions Weight Bearing Restrictions: No Other Position/Activity Restrictions: WBAT    Mobility  Bed Mobility               General bed mobility comments: up in recliner  Transfers Overall transfer level: Needs assistance Equipment used: Rolling walker (2 wheeled) Transfers: Sit to/from Stand Sit to Stand: Min guard         General transfer comment: VCs safety, hand placement. Min guard for safety.  Ambulation/Gait Ambulation/Gait assistance: Min guard Gait Distance (Feet): 115 Feet   Gait Pattern/deviations: Step-through pattern;Decreased stride length;Step-to pattern     General Gait Details: VCs safety, posture, proper use of RW.   Stairs Stairs: Yes Stairs assistance: Min guard Stair Management: Sideways;One rail Left;Step to pattern Number of Stairs: 2 General stair comments: up and over portable steps. vcs safety, technique, sequence. pt used 2 hands on 1 handrail. husband present to observe.   Wheelchair Mobility    Modified Rankin (Stroke Patients Only)       Balance                                            Cognition   Behavior During Therapy: WFL for tasks assessed/performed Overall Cognitive Status: No family/caregiver present to determine baseline cognitive functioning                                 General Comments: pt seems to have some memory deficits, noted h/o "brain injury" in H&P, can follow commands with frequent reminders      Exercises      General Comments        Pertinent Vitals/Pain Pain Assessment: 0-10 Pain Score: 5  Pain Location: L hip Pain Descriptors / Indicators: Sore Pain Intervention(s): Monitored during session    Home Living                      Prior Function            PT Goals (current goals can now be found in the care plan section) Progress towards PT goals: Progressing toward goals    Frequency    7X/week      PT Plan Current plan remains appropriate    Co-evaluation              AM-PAC PT "6 Clicks" Mobility   Outcome Measure  Help needed turning from your back to your side while in a flat bed without using bedrails?: A Little Help needed moving from lying on your back to sitting on the side of a flat bed without using bedrails?: A Little Help needed moving to and from a bed to a  chair (including a wheelchair)?: A Little Help needed standing up from a chair using your arms (e.g., wheelchair or bedside chair)?: A Little Help needed to walk in hospital room?: A Little Help needed climbing 3-5 steps with a railing? : A Little 6 Click Score: 18    End of Session Equipment Utilized During Treatment: Gait belt Activity Tolerance: Patient tolerated treatment well Patient left: in chair;with call bell/phone within reach;with family/visitor present   PT Visit Diagnosis: Other abnormalities of gait and mobility (R26.89) Pain - Right/Left: Left Pain - part of body: Hip     Time: [id number] PT Time Calculation (min) (ACUTE ONLY): 13 min  Charges:  $Gait Training: 8-22 mins                        Weston Anna, [place] Pager: [phone] Office: [phone]

## 2019-09-10 NOTE — Discharge Summary (Signed)
[facility] Discharge Summary   Patient: [name] [id number]   PCP: Aretta Nip, MD DOB: December 29, 1955   Date of admission: 09/06/2019   Date of discharge: 09/10/2019     Discharge Diagnoses:  Principal diagnosis Displaced transcervical left femoral neck fracture. Active Problems:   Hip fracture (HCC) Hypotension SVT Acute blood loss anemia postoperative.  Admitted From: home Disposition:  Home with therapy which will be arranged by orthopedics.  Recommendations for Outpatient Follow-up:  1. PCP: Patient will require a repeat cortisol level in 1 month 2. Follow up LABS/TEST: Repeat cortisol a.m.  Follow-up Information    Swinteck, Aaron Edelman, MD. Schedule an appointment as soon as possible for a visit in 2 weeks.   Specialty: Orthopedic Surgery Why: For wound re-check Contact information: [address] [place] [address] [place] [address] [id number]        Aretta Nip, MD. Schedule an appointment as soon as possible for a visit in 1 week(s).   Specialty: Family Medicine Why: will need a repeat AM cortisol level in 1 month.  Contact information: [place] [address] [phone]          Diet recommendation: Cardiac diet  Activity: The patient is advised to gradually reintroduce usual activities,as tolerated .  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 Ouida Sills of [facility] neurology, prior history of VT with irregular heartbeats-developed SVT during exercise treadmill 04/29/2017-echo at that time was normal cardiac MRI normal and was started on flecainide Lopressor and follows with Dr. Lynnda Shields heart cath 04/30/2018 EF 65%  Comes to emergency room with accidental fall and is accompanied by her husband-10 on 10 pain outside of fall-husband  claims that ever since she has been on [place] for the past several years she has had increase in frequency of falls Found to have left hip fracture Dr. Alma Friendly consulted and will be performing surgery a.m. 9/10-because of her prior cardiac history will be getting cardiology input Coronavirus testing negative"  Hospital Course:  Summary of her active problems in the hospital is as following. 1.Displaced transcervical left femoral neck fracture. [facility] consulted. S/P left total hip arthroplasty anterior approach September 07 2019. Cardiology was consulted, patient is cleared for surgery from cardiology point of view. Tolerated the procedure very well. Pain remains well controlled. No hematoma or bleeding at the surgical site. No nausea or vomiting. Postop therapy will be arranged by orthopedic office uppercase management.  We arranged necessary DME recommended by PT. Pain management and postop DVT prophylaxis per orthopedics.  2.History of SVT. Cardiology consulted, appreciate their assistance. Continue propafenone. Due to severe hypotension patient's Cardizem was held. With improvement in patient's blood pressure Cardizem was resumed which patient tolerated well without any drop in blood pressure. No evidence of SVTs.  3.Hypothermia. ? Hypotension Low cortisol level/?  Adrenal crisis-adrenal insufficiency ruled out. No evidence of active infection. Procalcitonin negative, lactic acid negative, TSH normal. Cortisol is relatively low. Cosyntropin stimulation test is negative. Patient's baseline cortisol level was significantly low but the patient had a robust response to cosyntropin stimulation Potentially her baseline cortisol level is low secondary to her being on Decadron. Echocardiogram showed preserved EF.  No significant valvular abnormality. Patient was treated with IV fluids as well as IV Decadron. Currently we will discontinue Decadron and recommend the  patient to  follow-up with PCP and consider repeat cortisol a.m. level in 1 month and if abnormal endocrinology referral.  4.Seizure disorder. Reported dizziness. Patient is chronically on Depakote and clobazam. Depakote level and ammonia level are relatively normal. No further work-up  5.  Postop acute blood loss anemia. Hemoglobin dropped from 12-7. No further bleeding. No hematoma.  No acute bleeding. No evidence of hemolysis. Ferritin normal as well. Reticulocyte count normal. Patient S/P 1 PRBC transfusion on 09/08/2019. Hemoglobin post transfusion remained stable. No evidence of hematoma no evidence of active bleeding.  6.  Hyponatremia.  Resolved Likely secondary to aggressive IV hydration. Currently sodium level back to normal.  7.  Postop delirium.  Resolved Continue to monitor.  Patient was seen by physical therapy, I personally called orthopedics as there was some discrepancy regarding discharge therapy.  Orthopedic recommended outpatient therapy which per case management will be arranged by orthopedic office.  On the day of the discharge the patient's vitals were stable, and no other acute medical condition were reported by patient. the patient was felt safe to be discharge at Home with outpatient therapy per orthopedics  Consultants: Orthopedics Procedures: S/P left hip arthroplasty  DISCHARGE MEDICATION: Allergies as of 09/10/2019   No Known Allergies     Medication List    STOP taking these medications   multivitamin with minerals Tabs tablet   naproxen sodium 220 MG tablet Commonly known as: ALEVE     TAKE these medications   aspirin 81 MG chewable tablet Commonly known as: Aspirin Childrens Chew 1 tablet (81 mg total) by mouth 2 (two) times daily with a meal. Notes to patient: 09/10/19 bedtime   calcium-vitamin D 250-125 MG-UNIT tablet Commonly known as: OSCAL WITH D Take by mouth as directed. Notes to patient: 09/10/19   diltiazem 120 MG  24 hr capsule Commonly known as: CARDIZEM CD TAKE 1 CAPSULE BY MOUTH EVERY DAY Notes to patient: 09/11/19   divalproex 500 MG 24 hr tablet Commonly known as: DEPAKOTE ER Take 1,000 mg by mouth at bedtime. Notes to patient: 09/10/19 bedtime   HYDROcodone-acetaminophen 5-325 MG tablet Commonly known as: NORCO/VICODIN Take 1-2 tablets by mouth every 6 (six) hours as needed for moderate pain. Notes to patient: 09/10/19 as needed   omeprazole 20 MG capsule Commonly known as: PRILOSEC Take 20 mg by mouth 2 (two) times daily. Notes to patient: 09/10/19 bedtime   Onfi 10 MG tablet Generic drug: cloBAZam Take 1 tablet (10 mg) in the mornings and one and a half tablets (15 mg) at bedtime by moth daily. Notes to patient: 09/10/19 bedtime   polyethylene glycol 17 g packet Commonly known as: MIRALAX / GLYCOLAX Take 17 g by mouth daily. Notes to patient: 09/11/19   propafenone 225 MG 12 hr capsule Commonly known as: RYTHMOL SR Take 1 capsule (225 mg total) by mouth 2 (two) times daily. Notes to patient: 09/10/19 bedtime            Durable Medical Equipment  (From admission, onward)         Start     Ordered   09/10/19 1142  For home use only DME Walker rolling  Once    Question:  Patient needs a walker to treat with the following condition  Answer:  Hip fracture ([place])   09/10/19 1141   09/10/19 1141  For home use only DME 3 n 1  Once     09/10/19 1141         No Known Allergies Discharge  Instructions    Diet - low sodium heart healthy   Complete by: As directed    Discharge instructions   Complete by: As directed    It is important that you read the given instructions as well as go over your medication list with RN to help you understand your care after this hospitalization.  Please follow-up with PCP in 1-2 weeks.  Please note that NO REFILLS for any discharge medications will be authorized once you are discharged, as it is imperative that you return to your primary  care physician (or establish a relationship with a primary care physician if you do not have one) for your aftercare needs so that they can reassess your need for medications and monitor your lab values.  Please request your primary care physician to go over all Hospital Tests and Procedure/Radiological results at the follow up. Please get all Hospital records sent to your PCP by signing hospital release before you go home.   Do not drive, operating heavy machinery, perform activities at heights, swimming or participation in water activities or provide baby sitting services while you are on Pain, Sleep and Anxiety Medications; until you have been seen by Primary Care Physician or a Neurologist and are cleared to do such activities.  Do not take more than prescribed Pain, Sleep and Anxiety Medications.  You were cared for by a hospitalist during your hospital stay. If you have any questions about your discharge medications or the care you received while you were in the hospital after you are discharged, you can call the unit @UNIT @ you were admitted to and ask to speak with the hospitalist Berle Mull. Ask for Hospitalist on call if the hospitalist that took care of you is not available.   Once you are discharged, your primary care physician will handle any further medical issues.  You Must read complete instructions/literature along with all the possible adverse reactions/side effects for all the Medicines you take and that have been prescribed to you. Take any new Medicines after you have completely understood and accept all the possible adverse reactions/side effects.  If you have smoked or chewed Tobacco in the last 2 yrs please STOP smoking Wear Seat belts while driving.   Increase activity slowly   Complete by: As directed      Discharge Exam: Filed Weights   09/06/19 1901  Weight: 61.6 kg   Vitals:   09/10/19 0449 09/10/19 1337  BP: 124/69 (!) 100/57  Pulse: 72 74  Resp: 16 14   Temp: 98.7 F (37.1 C) 98.6 F (37 C)  SpO2: 96% 97%   General: Appear in no distress, no Rash; Oral Mucosa Clear, moist. no Abnormal Mass Or lumps Cardiovascular: S1 and S2 Present, no Murmur, Respiratory: normal respiratory effort, Bilateral Air entry present and Clear to Auscultation, no Crackles, no wheezes Abdomen: Bowel Sound present, Soft and no tenderness, no hernia Extremities: no Pedal edema, no calf tenderness Neurology: alert and oriented to time, place, and person affect appropriate.  The results of significant diagnostics from this hospitalization (including imaging, microbiology, ancillary and laboratory) are listed below for reference.    Significant Diagnostic Studies: Pelvis Portable  Result Date: 09/07/2019 CLINICAL DATA:  63 year old female status post left hip replacement. EXAM: PORTABLE PELVIS 1-2 VIEWS COMPARISON:  Intraoperative fluoroscopy dated 09/07/2019 FINDINGS: There is a total left hip arthroplasty. The arthroplasty components appear intact and in anatomic alignment. There is no acute fracture or dislocation. Mild arthritic changes of the right hip. There  is mild osteopenia. Degenerative changes of the SI joints. The soft tissues are unremarkable. IMPRESSION: Total left hip arthroplasty appears intact and in anatomic alignment. Electronically Signed   By: Anner Crete M.D.   On: 09/07/2019 18:03   Dg Chest Port 1 View  Result Date: 09/06/2019 CLINICAL DATA:  Preop hip fracture EXAM: PORTABLE CHEST 1 VIEW COMPARISON:  03/28/2017 FINDINGS: The heart size and mediastinal contours are within normal limits. Both lungs are clear. The visualized skeletal structures are unremarkable. Previously noted left apical nodularity is less apparent. IMPRESSION: No active disease. Electronically Signed   By: Donavan Foil M.D.   On: 09/06/2019 15:08   Dg Knee Left Port  Result Date: 09/06/2019 CLINICAL DATA:  Fall with thigh pain EXAM: PORTABLE LEFT KNEE - 1-2 VIEW  COMPARISON:  None. FINDINGS: No fracture or malalignment. Minimal patellofemoral degenerative change. No large knee effusion IMPRESSION: No acute osseous abnormality. Electronically Signed   By: Donavan Foil M.D.   On: 09/06/2019 16:18   Dg C-arm 1-60 Min-no Report  Result Date: 09/07/2019 Fluoroscopy was utilized by the requesting physician.  No radiographic interpretation.   Dg Hip Operative Unilat W Or W/o Pelvis Left  Result Date: 09/07/2019 CLINICAL DATA:  Left hip replacement. EXAM: OPERATIVE left HIP (WITH PELVIS IF PERFORMED) 6 VIEWS TECHNIQUE: Fluoroscopic spot image(s) were submitted for interpretation post-operatively. FLUOROSCOPY TIME:  14 seconds. COMPARISON:  Radiographs of September 06, 2019. FINDINGS: Six intraoperative fluoroscopic images demonstrate placement of left total hip arthroplasty. The left femoral and acetabular components appear to be well situated. IMPRESSION: Fluoroscopic guidance provided during left total hip arthroplasty. Electronically Signed   By: Marijo Conception M.D.   On: 09/07/2019 17:02   Dg Hip Unilat With Pelvis 2-3 Views Left  Result Date: 09/06/2019 CLINICAL DATA:  63 year old female with history of trauma from a fall complaining of left-sided hip pain. EXAM: DG HIP (WITH OR WITHOUT PELVIS) 2-3V LEFT COMPARISON:  No priors. FINDINGS: Transcervical left femoral neck fracture with 2.1 cm proximal migration of the distal fracture fragment. Femoral head remains in the left acetabulum. Bony pelvis and right proximal femur otherwise appear intact. IMPRESSION: 1. Displaced transcervical left femoral neck fracture, as above. Electronically Signed   By: Vinnie Langton M.D.   On: 09/06/2019 14:21    Microbiology: Recent Results (from the past 240 hour(s))  SARS Coronavirus 2 [facility] order, Performed in [facility] hospital lab) Nasopharyngeal Nasopharyngeal Swab     Status: None   Collection Time: 09/06/19  2:32 PM   Specimen: Nasopharyngeal Swab  Result Value  Ref Range Status   SARS Coronavirus 2 NEGATIVE NEGATIVE Final    Comment: (NOTE) If result is NEGATIVE SARS-CoV-2 target nucleic acids are NOT DETECTED. The SARS-CoV-2 RNA is generally detectable in upper and lower  respiratory specimens during the acute phase of infection. The lowest  concentration of SARS-CoV-2 viral copies this assay can detect is 250  copies / mL. A negative result does not preclude SARS-CoV-2 infection  and should not be used as the sole basis for treatment or other  patient management decisions.  A negative result may occur with  improper specimen collection / handling, submission of specimen other  than nasopharyngeal swab, presence of viral mutation(s) within the  areas targeted by this assay, and inadequate number of viral copies  (<250 copies / mL). A negative result must be combined with clinical  observations, patient history, and epidemiological information. If result is POSITIVE SARS-CoV-2 target nucleic acids are DETECTED.  The SARS-CoV-2 RNA is generally detectable in upper and lower  respiratory specimens dur ing the acute phase of infection.  Positive  results are indicative of active infection with SARS-CoV-2.  Clinical  correlation with patient history and other diagnostic information is  necessary to determine patient infection status.  Positive results do  not rule out bacterial infection or co-infection with other viruses. If result is PRESUMPTIVE POSTIVE SARS-CoV-2 nucleic acids MAY BE PRESENT.   A presumptive positive result was obtained on the submitted specimen  and confirmed on repeat testing.  While 2019 novel coronavirus  (SARS-CoV-2) nucleic acids may be present in the submitted sample  additional confirmatory testing may be necessary for epidemiological  and / or clinical management purposes  to differentiate between  SARS-CoV-2 and other Sarbecovirus currently known to infect humans.  If clinically indicated additional testing with an  alternate test  methodology [phone]) is advised. The SARS-CoV-2 RNA is generally  detectable in upper and lower respiratory sp ecimens during the acute  phase of infection. The expected result is Negative. Fact Sheet for Patients:  StrictlyIdeas.no Fact Sheet for Healthcare Providers: BankingDealers.co.za This test is not yet approved or cleared by the [place] FDA and has been authorized for detection and/or diagnosis of SARS-CoV-2 by FDA under an Emergency Use Authorization (EUA).  This EUA will remain in effect (meaning this test can be used) for the duration of the COVID-19 declaration under Section 564(b)(1) of the Act, 21 U.S.C. section 360bbb-3(b)(1), unless the authorization is terminated or revoked sooner. Performed at [facility], [place] [address], [place], [place] [address]   Surgical pcr screen     Status: None   Collection Time: 09/06/19 11:38 PM   Specimen: Nasal Mucosa; Nasal Swab  Result Value Ref Range Status   MRSA, PCR NEGATIVE NEGATIVE Final   Staphylococcus aureus NEGATIVE NEGATIVE Final    Comment: (NOTE) The Xpert SA Assay (FDA approved for NASAL specimens in patients 51 years of age and older), is one component of a comprehensive surveillance program. It is not intended to diagnose infection nor to guide or monitor treatment. Performed at [facility], [place] [address] [place], [place] [address]   Culture, blood (routine x 2)     Status: None (Preliminary result)   Collection Time: 09/07/19  8:29 AM   Specimen: BLOOD LEFT ARM  Result Value Ref Range Status   Specimen Description   Final    BLOOD LEFT ARM Performed at [facility] [address] [place], [place] [address]    Special Requests   Final    BOTTLES DRAWN AEROBIC AND ANAEROBIC Blood Culture adequate volume Performed at [facility] [address]  [place], [place] [address]    Culture   Final    NO GROWTH 3 DAYS Performed at [facility], [place] [address] [place], [place] [address]    Report Status PENDING  Incomplete  Culture, blood (routine x 2)     Status: None (Preliminary result)   Collection Time: 09/07/19  8:29 AM   Specimen: BLOOD LEFT ARM  Result Value Ref Range Status   Specimen Description   Final    BLOOD LEFT ARM Performed at [facility] [address] [place], [place] [address]    Special Requests   Final    BOTTLES DRAWN AEROBIC AND ANAEROBIC Blood Culture adequate volume Performed at [facility] [address] [place], [place] [address]  Culture   Final    NO GROWTH 3 DAYS Performed at [facility], [place] [address], [place], [place] [address]    Report Status PENDING  Incomplete  Culture, Urine     Status: None   Collection Time: 09/08/19  5:00 PM   Specimen: Urine, Clean Catch  Result Value Ref Range Status   Specimen Description   Final    URINE, CLEAN CATCH Performed at [facility], [place] [address], [place], [place] [address]    Special Requests   Final    NONE Performed at [facility], [place] [address] [place], [place] [address]    Culture   Final    NO GROWTH Performed at [facility], [place] [address], [place], [place] [address]    Report Status 09/10/2019 FINAL  Final     Labs: CBC: Recent Labs  Lab 09/06/19 1422 09/07/19 0429 09/08/19 1243 09/08/19 1604 09/08/19 2317 09/09/19 0906 09/10/19 0511  WBC 3.3* 4.8 7.9 7.9  --  9.5 8.1  NEUTROABS 2.0  --  7.1  --   --   --   --   HGB 12.7 12.5 7.7* 7.3* 9.0* 9.3* 8.5*  HCT 36.7 37.6 22.7* 21.6* 25.9* 26.5* 24.4*  MCV 95.3 97.7 98.3 97.3  --  95.7 96.8  PLT 180 157 117* 113*  --  109* [id number]*   Basic Metabolic Panel: Recent Labs  Lab 09/06/19 1422 09/07/19 0429 09/08/19 0425 09/08/19 1243 09/09/19 0906 09/10/19 0511  NA 132* 131*  --   128* 133* 131*  K 4.0 3.9  --  4.7 4.5 4.4  CL 93* 94*  --  95* 98 93*  CO2 29 26  --  24 29 31   GLUCOSE 98 135*  --  165* 117* 98  BUN 16 22  --  19 16 17   CREATININE 0.82 0.79  --  0.94 0.59 0.60  CALCIUM 9.4 8.6*  --  7.7* 8.1* 8.1*  MG  --   --  1.7  --   --  1.9   Liver Function Tests: Recent Labs  Lab 09/08/19 1243  AST 24  ALT 14  ALKPHOS 51  BILITOT 0.3  PROT 4.5*  ALBUMIN 2.5*   No results for input(s): LIPASE, AMYLASE in the last 168 hours. Recent Labs  Lab 09/08/19 0440  AMMONIA 55*   Cardiac Enzymes: No results for input(s): CKTOTAL, CKMB, CKMBINDEX, TROPONINI in the last 168 hours. BNP (last 3 results) No results for input(s): BNP in the last 8760 hours. CBG: No results for input(s): GLUCAP in the last 168 hours.  Time spent: 35 minutes  Signed:  Berle Mull  [facility] 09/10/2019  5:19 PM

## 2019-09-10 NOTE — Progress Notes (Signed)
Reviewed discharge instructions with patient and patients husband. Pt and spouse verbalized understanding and had no further questions.

## 2019-09-10 NOTE — Progress Notes (Signed)
Cardiology Progress Note  Patient ID: [name] MRN: [id number] DOB: [birth year] Date of Encounter: 09/10/2019  Primary Cardiologist: No primary care provider on file.  Subjective  No events on telemetry.  She has been able to restart her home diltiazem without any issues.  No complaints this morning.  ROS:  All other ROS reviewed and negative. Pertinent positives noted in the HPI.     Inpatient Medications  Scheduled Meds:  cloBAZam  10 mg Oral Daily   cloBAZam  15 mg Oral QHS   diltiazem  120 mg Oral Daily   divalproex  1,000 mg Oral QHS   enoxaparin (LOVENOX) injection  40 mg Subcutaneous Q24H   mouth rinse  15 mL Mouth Rinse BID   propafenone  225 mg Oral Q12H   Continuous Infusions:  PRN Meds: acetaminophen, HYDROcodone-acetaminophen, menthol-cetylpyridinium **OR** phenol, morphine injection, ondansetron **OR** ondansetron (ZOFRAN) IV   Vital Signs   Vitals:   09/09/19 0500 09/09/19 1323 09/09/19 2140 09/10/19 0449  BP: 133/71 105/62 117/66 124/69  Pulse: 83 78 70 72  Resp: 18 14 14 16   Temp: 98.7 F (37.1 C) 98.3 F (36.8 C) 98.7 F (37.1 C) 98.7 F (37.1 C)  TempSrc: Oral Oral Oral Oral  SpO2: 95% 96% 95% 96%  Weight:      Height:        Intake/Output Summary (Last 24 hours) at 09/10/2019 0909 Last data filed at 09/10/2019 0800 Gross per 24 hour  Intake 720 ml  Output 1750 ml  Net -1030 ml   Last 3 Weights 09/06/2019 03/21/2019 09/14/2018  Weight (lbs) 135 lb 12.9 oz 134 lb 135 lb  Weight (kg) 61.6 kg 60.782 kg 61.236 kg      Telemetry  Overnight telemetry shows normal sinus rhythm heart rate 70s she did have spikes over the 90s but I assume this with exercise, which I personally reviewed.   Physical Exam   Vitals:   09/09/19 0500 09/09/19 1323 09/09/19 2140 09/10/19 0449  BP: 133/71 105/62 117/66 124/69  Pulse: 83 78 70 72  Resp: 18 14 14 16   Temp: 98.7 F (37.1 C) 98.3 F (36.8 C) 98.7 F (37.1 C) 98.7 F (37.1 C)  TempSrc: Oral  Oral Oral Oral  SpO2: 95% 96% 95% 96%  Weight:      Height:         Intake/Output Summary (Last 24 hours) at 09/10/2019 0909 Last data filed at 09/10/2019 0800 Gross per 24 hour  Intake 720 ml  Output 1750 ml  Net -1030 ml    Last 3 Weights 09/06/2019 03/21/2019 09/14/2018  Weight (lbs) 135 lb 12.9 oz 134 lb 135 lb  Weight (kg) 61.6 kg 60.782 kg 61.236 kg    Body mass index is 24.06 kg/m.  General: Well nourished, well developed, in no acute distress Heart: Atraumatic, normal size  Eyes: PEERLA, EOMI  Neck: Supple, no JVD Endocrine: No thryomegaly Cardiac: Normal S1, S2; RRR; no murmurs, rubs, or gallops Lungs: Clear to auscultation bilaterally, no wheezing, rhonchi or rales  Abd: Soft, nontender, no hepatomegaly  Ext: No edema, pulses 2+ Musculoskeletal: No deformities, BUE and BLE strength normal and equal Skin: Warm and dry, no rashes   Neuro: Alert and oriented to person, place, time, and situation, CNII-XII grossly intact, no focal deficits  Psych: Normal mood and affect   Labs  High Sensitivity Troponin:  No results for input(s): TROPONINIHS in the last 720 hours.   Cardiac EnzymesNo results for input(s): TROPONINI in the  last 168 hours. No results for input(s): TROPIPOC in the last 168 hours.  Chemistry Recent Labs  Lab 09/08/19 1243 09/09/19 0906 09/10/19 0511  NA 128* 133* 131*  K 4.7 4.5 4.4  CL 95* 98 93*  CO2 24 29 31   GLUCOSE 165* 117* 98  BUN 19 16 17   CREATININE 0.94 0.59 0.60  CALCIUM 7.7* 8.1* 8.1*  PROT 4.5*  --   --   ALBUMIN 2.5*  --   --   AST 24  --   --   ALT 14  --   --   ALKPHOS 51  --   --   BILITOT 0.3  --   --   GFRNONAA >60 >60 >60  GFRAA >60 >60 >60  ANIONGAP 9 6 7     Hematology Recent Labs  Lab 09/08/19 1604 09/08/19 2317 09/09/19 0906 09/10/19 0511  WBC 7.9  --  9.5 8.1  RBC 2.22*   2.22*  --  2.77* 2.52*  HGB 7.3* 9.0* 9.3* 8.5*  HCT 21.6* 25.9* 26.5* 24.4*  MCV 97.3  --  95.7 96.8  MCH 32.9  --  33.6 33.7  MCHC 33.8   --  35.1 34.8  RDW 12.7  --  13.2 13.3  PLT 113*  --  109* 113*   BNPNo results for input(s): BNP, PROBNP in the last 168 hours.  DDimer No results for input(s): DDIMER in the last 168 hours.   Radiology  No results found.  Cardiac Studies  TTE 09/08/2019 1. The left ventricle has normal systolic function, with an ejection fraction of 55-60%. The cavity size was normal. Left ventricular diastolic parameters were normal. No evidence of left ventricular regional wall motion abnormalities. 2. The right ventricle has normal systolic function. The cavity was normal. There is no increase in right ventricular wall thickness. Right ventricular systolic pressure is moderately elevated with an estimated pressure of 45.5 mmHg. 3. The aorta is not well visualized unless otherwise noted. 4. The inferior vena cava was dilated in size with <50% respiratory variability.  Patient Profile  [name] is a 63 y.o. female with history of exercise-induced ventricular tachycardia (normal cardiac catheterization, normal cardiac MRI), admitted for left hip fracture status post left hip replacement on 09/07/2019.  Cardiology consulted for management of arrhythmia.  Assessment & Plan  1.  Exercise-induced ventricular tachycardia -Tolerating home diltiazem and propafenone well -Blood pressure stable -I have arranged follow-up for her to see Dr. Curt Bears her primary electrophysiologist on November 2, and this should be fine as she has no real major issues   [facility] will sign off.   Medication Recommendations: Home medications as above tolerating well Other recommendations (labs, testing, etc): None Follow up as an outpatient: She has follow-up with Dr. Allegra Lai on October 30, 2019  For questions or updates, please contact [facility] Please consult [email or web address] for contact info under        Signed, [place] B[place] T. Audie Box, [place]  09/10/2019 9:09 AM

## 2019-09-11 ENCOUNTER — Encounter (HOSPITAL_COMMUNITY): Payer: Self-pay | Admitting: Orthopedic Surgery

## 2019-09-12 LAB — CULTURE, BLOOD (ROUTINE X 2)
Culture: NO GROWTH
Culture: NO GROWTH
Special Requests: ADEQUATE
Special Requests: ADEQUATE

## 2019-09-18 ENCOUNTER — Other Ambulatory Visit (HOSPITAL_BASED_OUTPATIENT_CLINIC_OR_DEPARTMENT_OTHER): Payer: Self-pay | Admitting: Family Medicine

## 2019-09-18 DIAGNOSIS — R600 Localized edema: Secondary | ICD-10-CM

## 2019-09-19 ENCOUNTER — Other Ambulatory Visit: Payer: Self-pay

## 2019-09-19 ENCOUNTER — Ambulatory Visit (HOSPITAL_BASED_OUTPATIENT_CLINIC_OR_DEPARTMENT_OTHER)
Admission: RE | Admit: 2019-09-19 | Discharge: 2019-09-19 | Disposition: A | Discharge status: Home / Self Care | Payer: BC Managed Care – PPO | Source: Ambulatory Visit | Attending: Family Medicine | Admitting: Family Medicine

## 2019-09-19 DIAGNOSIS — R600 Localized edema: Secondary | ICD-10-CM | POA: Diagnosis not present

## 2019-10-30 ENCOUNTER — Other Ambulatory Visit: Payer: Self-pay

## 2019-10-30 ENCOUNTER — Encounter: Payer: Self-pay | Admitting: Cardiology

## 2019-10-30 ENCOUNTER — Ambulatory Visit (INDEPENDENT_AMBULATORY_CARE_PROVIDER_SITE_OTHER): Payer: BC Managed Care – PPO | Admitting: Cardiology

## 2019-10-30 VITALS — BP 102/66 | HR 68 | Ht 63.0 in | Wt 145.0 lb

## 2019-10-30 DIAGNOSIS — I472 Ventricular tachycardia, unspecified: Secondary | ICD-10-CM

## 2019-10-30 NOTE — Progress Notes (Signed)
Electrophysiology Office Note   Date:  10/30/2019   ID:  [name], [name] Jul 22, 1956, MRN [id number]  PCP:  Aretta Nip, MD  Cardiologist:  Stanford Breed Primary Electrophysiologist:  Will Meredith Leeds, MD    No chief complaint on file.    History of Present Illness: [name] is a 63 y.o. female who is being seen today for the evaluation of VT at the request of [name], [name], MD. Presenting today for electrophysiology evaluation. In the hospital on 04/30/17 after an exercise treadmill test resulting in ventricular tachycardia. She does have a history of seizures with bigeminy and PVCs. She gets occasional palpitations that last between 5 and 10 minutes. During stage III of her exercise treadmill test, she went into a sustained episode of ventricular tachycardia. The treadmill was stopped and her VT converted to sinus rhythm. VT was associated with shortness of breath. Had a cath that showed no evidence of coronary disease and a cardiac MRI that showed no evidence of scar.  She had a fall in September which resulted in a left hip fracture.  She is since had surgery.  Her main complaint today is weakness and fatigue.  She feels that is potentially due to the medication.  She was recently switched to diltiazem from metoprolol which greatly improved her fatigue symptoms.  Today, denies symptoms of palpitations, chest pain, shortness of breath, orthopnea, PND, lower extremity edema, claudication, dizziness, presyncope, syncope, bleeding, or neurologic sequela. The patient is tolerating medications without difficulties.    Past Medical History:  Diagnosis Date  . Arthritis lower back/hands  . Brain injury ([place])   . Cancer [facility]) breast tumor removed  . Leg fracture   . Seizure disorder ([place])   . Seizures ([place])   . VT (ventricular tachycardia) ([place]) 04/29/2017   Past Surgical History:  Procedure Laterality Date  . BREAST SURGERY    . FACIAL FRACTURE SURGERY  orbital bone  repair  . LEFT HEART CATH AND CORONARY ANGIOGRAPHY N/A 04/30/2017   Procedure: Left Heart Cath and Coronary Angiography;  Surgeon: Leonie Man, MD;  Location: [facility];  Service: Cardiovascular;  Laterality: N/A;  . TOTAL HIP ARTHROPLASTY Left 09/07/2019   Procedure: TOTAL HIP ARTHROPLASTY ANTERIOR APPROACH;  Surgeon: Rod Can, MD;  Location: [facility];  Service: Orthopedics;  Laterality: Left;     Current Outpatient Medications  Medication Sig Dispense Refill  . calcium-vitamin D (OSCAL WITH D) 250-125 MG-UNIT tablet Take by mouth as directed.     . cloBAZam (ONFI) 10 MG tablet Take 1 tablet (10 mg) in the mornings and one and a half tablets (15 mg) at bedtime by moth daily.    . divalproex (DEPAKOTE ER) 500 MG 24 hr tablet Take 1,000 mg by mouth at bedtime.     Marland Kitchen HYDROcodone-acetaminophen (NORCO/VICODIN) 5-325 MG tablet Take 1-2 tablets by mouth every 6 (six) hours as needed for moderate pain. 30 tablet 0  . omeprazole (PRILOSEC) 20 MG capsule Take 20 mg by mouth 2 (two) times daily.    . polyethylene glycol (MIRALAX / GLYCOLAX) packet Take 17 g by mouth daily.    . propafenone (RYTHMOL SR) 225 MG 12 hr capsule Take 1 capsule (225 mg total) by mouth 2 (two) times daily. 60 capsule 6   No current facility-administered medications for this visit.     Allergies:   Patient has no known allergies.   Social History:  The patient  reports that she has never smoked.  She has never used smokeless tobacco. She reports that she does not drink alcohol or use drugs.   Family History:  The patient's family history includes Heart disease in her mother; Heart failure in her mother; Pancreatic cancer in her father.   ROS:  Please see the history of present illness.   Otherwise, review of systems is positive for none.   All other systems are reviewed and negative.   PHYSICAL EXAM: VS:  BP 102/66   Pulse 68   Ht 5\' 3"  (1.6 m)   Wt 145 lb (65.8 kg)   SpO2 99%   BMI 25.69 kg/m  , BMI  Body mass index is 25.69 kg/m. GEN: Well nourished, well developed, in no acute distress  HEENT: normal  Neck: no JVD, carotid bruits, or masses Cardiac: RRR; no murmurs, rubs, or gallops,no edema  Respiratory:  clear to auscultation bilaterally, normal work of breathing GI: soft, nontender, nondistended, + BS MS: no deformity or atrophy  Skin: warm and dry Neuro:  Strength and sensation are intact Psych: euthymic mood, full affect  EKG:  EKG is ordered today. Personal review of the ekg ordered  shows this rhythm, anterior T wave inversions, rate 68  Recent Labs: 09/07/2019: TSH 4.230 09/08/2019: ALT 14 09/10/2019: BUN 17; Creatinine, Ser 0.60; Hemoglobin 8.5; Magnesium 1.9; Platelets 113; Potassium 4.4; Sodium 131    Lipid Panel     Component Value Date/Time   CHOL 188 04/30/2017 0338   TRIG 24 04/30/2017 0338   HDL 106 04/30/2017 0338   CHOLHDL 1.8 04/30/2017 0338   VLDL 5 04/30/2017 0338   LDLCALC 77 04/30/2017 0338     Wt Readings from Last 3 Encounters:  10/30/19 145 lb (65.8 kg)  09/06/19 135 lb 12.9 oz (61.6 kg)  03/21/19 134 lb (60.8 kg)      Other studies Reviewed: Additional studies/ records that were reviewed today include: TTE 05/02/17, Cath 04/30/17, ETT 05/11/17  Review of the above records today demonstrates:  - Left ventricle: The cavity size was normal. Systolic function was   normal. The estimated ejection fraction was in the range of 60%   to 65%. Wall motion was normal; there were no regional wall   motion abnormalities. - Aortic valve: Trileaflet; mildly thickened, mildly calcified   leaflets. - Mitral valve: There was trivial regurgitation. - Right atrium: The atrium was mildly dilated. - Tricuspid valve: There was trivial regurgitation.    The left ventricular systolic function is normal. The left ventricular ejection fraction is greater than 65% by visual estimate.  LV end diastolic pressure is normal. 11 mmHg  Angiographically normal  coronary arteries   Angiographic normal coronary arteries with normal LV function and EDP.   Blood pressure demonstrated a normal response to exercise.  No T wave inversion was noted during stress.  There was no ST segment deviation noted during stress.  The patient requested the test to be stopped.  Heart rate demonstrated pharmacologically blunted response to exercise  Overall, the patient's exercise capacity was normal.  Duke Treadmill Score: low risk   CMRI 05/03/17 1) Normal LV size and function EF 75%  2) Normal RV size and function no evidence of RV dysplasia  3) No delayed gadolinium enhancement in LV myocardium on inversion recovery sequences  4) Normal AV, MV and TV  5) Prominent epicardial fat pad  ASSESSMENT AND PLAN:  1.  Ventricular tachycardia: Heart cath and cardiac MRI without evidence of coronary artery disease or ventricular scar.  Is  currently on Rythmol and diltiazem.  She is having some fatigue.  We will thus stop diltiazem.  She will call us back if this does not make a difference.    Current medicines are reviewed at length with the patient today.   The patient does not have concerns regarding her medicines.  The following changes were made today: Stop diltiazem  Labs/ tests ordered today include:  Orders Placed This Encounter  Procedures  . EKG 12-Lead     Disposition:   FU with Will Camnitz 6 months  Signed, Will Meredith Leeds, MD  10/30/2019 11:21 AM     [facility] [address] [place] [address] [phone] (office) [phone] (fax)

## 2019-11-26 ENCOUNTER — Other Ambulatory Visit: Payer: Self-pay | Admitting: Cardiology

## 2019-12-14 ENCOUNTER — Other Ambulatory Visit: Payer: Self-pay | Admitting: Cardiology

## 2020-01-23 ENCOUNTER — Other Ambulatory Visit: Payer: Self-pay | Admitting: Obstetrics and Gynecology

## 2020-01-23 DIAGNOSIS — M1990 Unspecified osteoarthritis, unspecified site: Secondary | ICD-10-CM

## 2020-04-05 ENCOUNTER — Ambulatory Visit
Admission: RE | Admit: 2020-04-05 | Discharge: 2020-04-05 | Disposition: A | Discharge status: Home / Self Care | Payer: BC Managed Care – PPO | Source: Ambulatory Visit | Attending: Obstetrics and Gynecology | Admitting: Obstetrics and Gynecology

## 2020-04-05 ENCOUNTER — Other Ambulatory Visit: Payer: Self-pay

## 2020-04-05 DIAGNOSIS — M1990 Unspecified osteoarthritis, unspecified site: Secondary | ICD-10-CM

## 2020-07-09 ENCOUNTER — Other Ambulatory Visit: Payer: Self-pay

## 2020-07-09 ENCOUNTER — Ambulatory Visit (INDEPENDENT_AMBULATORY_CARE_PROVIDER_SITE_OTHER): Payer: BC Managed Care – PPO | Admitting: Cardiology

## 2020-07-09 ENCOUNTER — Encounter: Payer: Self-pay | Admitting: Cardiology

## 2020-07-09 VITALS — BP 104/66 | HR 72 | Ht 64.0 in | Wt 155.0 lb

## 2020-07-09 DIAGNOSIS — R5383 Other fatigue: Secondary | ICD-10-CM

## 2020-07-09 DIAGNOSIS — I472 Ventricular tachycardia, unspecified: Secondary | ICD-10-CM

## 2020-07-09 NOTE — Patient Instructions (Signed)
Medication Instructions:  Your physician recommends that you continue on your current medications as directed. Please refer to the Current Medication list given to you today.  *If you need a refill on your cardiac medications before your next appointment, please call your pharmacy*   Lab Work: Today: TSH & CBC If you have labs (blood work) drawn today and your tests are completely normal, you will receive your results only by: Marland Kitchen MyChart Message (if you have MyChart) OR . A paper copy in the mail If you have any lab test that is abnormal or we need to change your treatment, we will call you to review the results.   Testing/Procedures: None ordered   Follow-Up: At [facility], you and your health needs are our priority.  As part of our continuing mission to provide you with exceptional heart care, we have created designated Provider Care Teams.  These Care Teams include your primary Cardiologist (physician) and Advanced Practice Providers (APPs -  Physician Assistants and Nurse Practitioners) who all work together to provide you with the care you need, when you need it.  We recommend signing up for the patient portal called "MyChart".  Sign up information is provided on this After Visit Summary.  MyChart is used to connect with patients for Virtual Visits (Telemedicine).  Patients are able to view lab/test results, encounter notes, upcoming appointments, etc.  Non-urgent messages can be sent to your provider as well.   To learn more about what you can do with MyChart, go to [email or web address].    Your next appointment:   6 month(s)  The format for your next appointment:   In Person  Provider:   Allegra Lai, MD   Thank you for choosing [facility]!!   Trinidad Curet, RN [phone]    Other Instructions

## 2020-07-09 NOTE — Progress Notes (Signed)
Electrophysiology Office Note   Date:  07/09/2020   ID:  Zanae, Kuehnle [birth year], MRN [id number]  PCP:  Aretta Nip, MD  Cardiologist:  Stanford Breed Primary Electrophysiologist:  Crytal Pensinger Meredith Leeds, MD    No chief complaint on file.    History of Present Illness: [name] is a 64 y.o. female who is being seen today for the evaluation of VT at the request of [name], [name], MD. Presenting today for electrophysiology evaluation. In the hospital on 04/30/17 after an exercise treadmill test resulting in ventricular tachycardia. She does have a history of seizures with bigeminy and PVCs. She gets occasional palpitations that last between 5 and 10 minutes. During stage III of her exercise treadmill test, she went into a sustained episode of ventricular tachycardia. The treadmill was stopped and her VT converted to sinus rhythm. VT was associated with shortness of breath. Had a cath that showed no evidence of coronary disease and a cardiac MRI that showed no evidence of scar.   Today, denies symptoms of palpitations, chest pain, shortness of breath, orthopnea, PND, lower extremity edema, claudication, dizziness, presyncope, syncope, bleeding, or neurologic sequela. The patient is tolerating medications without difficulties.  She overall has been doing well from a cardiac standpoint.  She is unaware of palpitations.  She has had some fatigue.  This mainly occurs when it is hot outside.  She also fell and hit her left knee and is wearing a knee brace.  She plans to start physical therapy next week.  Past Medical History:  Diagnosis Date   Arthritis lower back/hands   Brain injury ([place])    Cancer ([place]) breast tumor removed   Leg fracture    Seizure disorder ([place])    Seizures ([place])    VT (ventricular tachycardia) ([place]) 04/29/2017   Past Surgical History:  Procedure Laterality Date   BREAST SURGERY     FACIAL FRACTURE SURGERY  orbital bone repair   LEFT HEART CATH  AND CORONARY ANGIOGRAPHY N/A 04/30/2017   Procedure: Left Heart Cath and Coronary Angiography;  Surgeon: Leonie Man, MD;  Location: [facility];  Service: Cardiovascular;  Laterality: N/A;   TOTAL HIP ARTHROPLASTY Left 09/07/2019   Procedure: TOTAL HIP ARTHROPLASTY ANTERIOR APPROACH;  Surgeon: Rod Can, MD;  [facility];  Service: Orthopedics;  Laterality: Left;     Current Outpatient Medications  Medication Sig Dispense Refill   calcium-vitamin D (OSCAL WITH D) 250-125 MG-UNIT tablet Take by mouth as directed.      cloBAZam (ONFI) 10 MG tablet Take 1 tablet (10 mg) in the mornings and one and a half tablets (15 mg) at bedtime by moth daily.     divalproex (DEPAKOTE ER) 500 MG 24 hr tablet Take 1,000 mg by mouth at bedtime.      HYDROcodone-acetaminophen (NORCO/VICODIN) 5-325 MG tablet Take 1-2 tablets by mouth every 6 (six) hours as needed for moderate pain. 30 tablet 0   omeprazole (PRILOSEC) 20 MG capsule Take 20 mg by mouth 2 (two) times daily.     polyethylene glycol (MIRALAX / GLYCOLAX) packet Take 17 g by mouth daily.     propafenone (RYTHMOL SR) 225 MG 12 hr capsule TAKE 1 CAPSULE (225 MG TOTAL) BY MOUTH 2 (TWO) TIMES DAILY. 180 capsule 2   No current facility-administered medications for this visit.    Allergies:   Patient has no known allergies.   Social History:  The patient  reports that she has never smoked.  She has never used smokeless tobacco. She reports that she does not drink alcohol and does not use drugs.   Family History:  The patient's family history includes Heart disease in her mother; Heart failure in her mother; Pancreatic cancer in her father.   ROS:  Please see the history of present illness.   Otherwise, review of systems is positive for none.   All other systems are reviewed and negative.   PHYSICAL EXAM: VS:  BP 104/66    Pulse 72    Ht 5\' 4"  (1.626 m)    Wt 155 lb (70.3 kg)    SpO2 95%    BMI 26.61 kg/m  , BMI Body mass index  is 26.61 kg/m. GEN: Well nourished, well developed, in no acute distress  HEENT: normal  Neck: no JVD, carotid bruits, or masses Cardiac: RRR; no murmurs, rubs, or gallops,no edema  Respiratory:  clear to auscultation bilaterally, normal work of breathing GI: soft, nontender, nondistended, + BS MS: no deformity or atrophy  Skin: warm and dry Neuro:  Strength and sensation are intact Psych: euthymic mood, full affect  EKG:  EKG is ordered today. Personal review of the ekg ordered shows sinus rhythm, rate 72  Recent Labs: 09/07/2019: TSH 4.230 09/08/2019: ALT 14 09/10/2019: BUN 17; Creatinine, Ser 0.60; Hemoglobin 8.5; Magnesium 1.9; Platelets 113; Potassium 4.4; Sodium 131    Lipid Panel     Component Value Date/Time   CHOL 188 04/30/2017 0338   TRIG 24 04/30/2017 0338   HDL 106 04/30/2017 0338   CHOLHDL 1.8 04/30/2017 0338   VLDL 5 04/30/2017 0338   LDLCALC 77 04/30/2017 0338     Wt Readings from Last 3 Encounters:  07/09/20 155 lb (70.3 kg)  10/30/19 145 lb (65.8 kg)  09/06/19 135 lb 12.9 oz (61.6 kg)      Other studies Reviewed: Additional studies/ records that were reviewed today include: TTE 05/02/17, Cath 04/30/17, ETT 05/11/17  Review of the above records today demonstrates:  - Left ventricle: The cavity size was normal. Systolic function was   normal. The estimated ejection fraction was in the range of 60%   to 65%. Wall motion was normal; there were no regional wall   motion abnormalities. - Aortic valve: Trileaflet; mildly thickened, mildly calcified   leaflets. - Mitral valve: There was trivial regurgitation. - Right atrium: The atrium was mildly dilated. - Tricuspid valve: There was trivial regurgitation.    The left ventricular systolic function is normal. The left ventricular ejection fraction is greater than 65% by visual estimate.  LV end diastolic pressure is normal. 11 mmHg  Angiographically normal coronary arteries   Angiographic normal coronary  arteries with normal LV function and EDP.   Blood pressure demonstrated a normal response to exercise.  No T wave inversion was noted during stress.  There was no ST segment deviation noted during stress.  The patient requested the test to be stopped.  Heart rate demonstrated pharmacologically blunted response to exercise  Overall, the patient's exercise capacity was normal.  Duke Treadmill Score: low risk   CMRI 05/03/17 1) Normal LV size and function EF 75%  2) Normal RV size and function no evidence of RV dysplasia  3) No delayed gadolinium enhancement in LV myocardium on inversion recovery sequences  4) Normal AV, MV and TV  5) Prominent epicardial fat pad  ASSESSMENT AND PLAN:  1. Ventricular tachycardia: Left heart cath and cardiac MRI without evidence of coronary artery disease or ventricular scar.  Currently on Rythmol.**  2.  Fatigue: Unclear to me as to the cause.  I do not think this is cardiac related.  Despite that, she was seen to be anemic last fall after a hip fracture.  We Zarion Oliff recheck a CBC and a TSH to see if this explains her fatigue.  Current medicines are reviewed at length with the patient today.   The patient does not have concerns regarding her medicines.  The following changes were made today: None  Labs/ tests ordered today include:  Orders Placed This Encounter  Procedures   TSH   CBC   EKG 12-Lead     Disposition:   FU with Angelice Piech 6 months  Signed, Aariya Ferrick Meredith Leeds, MD  07/09/2020 10:33 AM     [place] [address] [phone] (office) [phone] (fax)

## 2020-07-10 NOTE — Addendum Note (Signed)
Addended by: Stanton Kidney on: 07/10/2020 08:42 AM   Modules accepted: Orders

## 2020-07-12 ENCOUNTER — Other Ambulatory Visit: Payer: BC Managed Care – PPO | Admitting: *Deleted

## 2020-07-12 ENCOUNTER — Other Ambulatory Visit: Payer: Self-pay

## 2020-07-12 DIAGNOSIS — R5383 Other fatigue: Secondary | ICD-10-CM

## 2020-07-13 LAB — CBC
Hematocrit: 32.8 % — ABNORMAL LOW (ref 34.0–46.6)
Hemoglobin: 11.3 g/dL (ref 11.1–15.9)
MCH: 31 pg (ref 26.6–33.0)
MCHC: 34.5 g/dL (ref 31.5–35.7)
MCV: 90 fL (ref 79–97)
Platelets: 210 10*3/uL (ref 150–450)
RBC: 3.65 x10E6/uL — ABNORMAL LOW (ref 3.77–5.28)
RDW: 12.9 % (ref 11.7–15.4)
WBC: 3.1 10*3/uL — ABNORMAL LOW (ref 3.4–10.8)

## 2020-07-13 LAB — TSH: TSH: 4.34 u[IU]/mL (ref 0.450–4.500)

## 2020-07-16 ENCOUNTER — Telehealth: Payer: Self-pay | Admitting: Cardiology

## 2020-07-16 NOTE — Telephone Encounter (Signed)
Patient is returning call to discuss results from lab work completed on 07/12/20. Please call.

## 2020-07-16 NOTE — Telephone Encounter (Signed)
Pt has been notified of lab results by phone. See lab results notes.

## 2020-08-05 ENCOUNTER — Other Ambulatory Visit: Payer: Self-pay

## 2020-08-05 ENCOUNTER — Ambulatory Visit (INDEPENDENT_AMBULATORY_CARE_PROVIDER_SITE_OTHER): Payer: BC Managed Care – PPO | Admitting: Physician Assistant

## 2020-08-05 ENCOUNTER — Encounter: Payer: Self-pay | Admitting: Genetic Counselor

## 2020-08-05 ENCOUNTER — Telehealth: Payer: Self-pay | Admitting: *Deleted

## 2020-08-05 VITALS — BP 105/72 | HR 67 | Ht 64.0 in | Wt 148.0 lb

## 2020-08-05 DIAGNOSIS — I472 Ventricular tachycardia, unspecified: Secondary | ICD-10-CM

## 2020-08-05 DIAGNOSIS — I959 Hypotension, unspecified: Secondary | ICD-10-CM | POA: Diagnosis not present

## 2020-08-05 DIAGNOSIS — R55 Syncope and collapse: Secondary | ICD-10-CM

## 2020-08-05 NOTE — Telephone Encounter (Signed)
[facility] called to report this patient passed out in church yesterday. There was a hospital provider onsite, he reported he had trouble arousing her. She had a thready pulse, when she came to she was groggy. Suanne Marker is asking for the patient to be seen today or asap. She will see renee ursey at 1 pm at [employer] [address] office. Left message for pt with appointment details.

## 2020-08-05 NOTE — Patient Instructions (Addendum)
Medication Instructions:   Your physician recommends that you continue on your current medications as directed. Please refer to the Current Medication list given to you today.   Your physician recommends that you continue on your current medications as directed. Please refer to the Current Medication list given to you today.  *If you need a refill on your cardiac medications before your next appointment, please call your pharmacy*   Lab Work:  [facility]  AND [place]   If you have labs (blood work) drawn today and your tests are completely normal, you will receive your results only by:  [place] (if you have MyChart) OR  A paper copy in the mail If you have any lab test that is abnormal or we need to change your treatment, we will call you to review the results.   Testing/Procedures:NONE ORDERED  TODAY    Follow-Up: At [facility], you and your health needs are our priority.  As part of our continuing mission to provide you with exceptional heart care, we have created designated Provider Care Teams.  These Care Teams include your primary Cardiologist (physician) and Advanced Practice Providers (APPs -  Physician Assistants and Nurse Practitioners) who all work together to provide you with the care you need, when you need it.  We recommend signing up for the patient portal called "MyChart".  Sign up information is provided on this After Visit Summary.  MyChart is used to connect with patients for Virtual Visits (Telemedicine).  Patients are able to view lab/test results, encounter notes, upcoming appointments, etc.  Non-urgent messages can be sent to your provider as well.   To learn more about what you can do with MyChart, go to [email or web address].    Your next appointment:   3-4  week(s)  The format for your next appointment:   In Person  Provider:   You may see Will Meredith Leeds, MD or one of the following Advanced Practice Providers on your designated Care Team:     Chanetta Marshall, NP  Tommye Standard, [place]  Legrand Como "Jonni Sanger" [place], [place]    Other Instructions    INCREASE YOUR HYDRATION ADDING A GATORADE AND SOME SALTY FOODS TO DIET  ZIO XT- Long Term Monitor Instructions   Your physician has requested you wear your ZIO patch monitor__14_____days.   This is a single patch monitor.  [employer] supplies one patch monitor per enrollment.  Additional stickers are not available.   Please do not apply patch if you will be having a Nuclear Stress Test, Echocardiogram, Cardiac CT, MRI, or Chest Xray during the time frame you would be wearing the monitor. The patch cannot be worn during these tests.  You cannot remove and re-apply the ZIO XT patch monitor.   Your ZIO patch monitor will be sent USPS Priority mail from [facility] directly to your home address. The monitor may also be mailed to a PO BOX if home delivery is not available.   It may take 3-5 days to receive your monitor after you have been enrolled.   Once you have received you monitor, please review enclosed instructions.  Your monitor has already been registered assigning a specific monitor serial # to you.   Applying the monitor   Shave hair from upper left chest.   Hold abrader disc by orange tab.  Rub abrader in 40 strokes over left upper chest as indicated in your monitor instructions.   Clean area with 4 enclosed alcohol pads .  Use all pads to  assure are is cleaned thoroughly.  Let dry.   Apply patch as indicated in monitor instructions.  Patch will be place under collarbone on left side of chest with arrow pointing upward.   Rub patch adhesive wings for 2 minutes.Remove white label marked "1".  Remove white label marked "2".  Rub patch adhesive wings for 2 additional minutes.   While looking in a mirror, press and release button in center of patch.  A small green light will flash 3-4 times .  This will be your only indicator the monitor has been turned on.     Do not shower  for the first 24 hours.  You may shower after the first 24 hours.   Press button if you feel a symptom. You will hear a small click.  Record Date, Time and Symptom in the Patient Log Book.   When you are ready to remove patch, follow instructions on last 2 pages of Patient Log Book.  Stick patch monitor onto last page of Patient Log Book.   Place Patient Log Book in [place] box.  Use locking tab on box and tape box closed securely.  The [employer] and [employer] has [employer] on it.  Please place in mailbox as soon as possible.  Your physician should have your test results approximately 7 days after the monitor has been mailed back to [facility].   Call [place] at [phone] if you have questions regarding your ZIO XT patch monitor.  Call them immediately if you see an orange light blinking on your monitor.   If your monitor falls off in less than 4 days contact our Monitor department at [phone].  If your monitor becomes loose or falls off after 4 days call [employer] at [phone] for suggestions on securing your monitor.
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
## 2020-08-06 LAB — MAGNESIUM: Magnesium: 1.9 mg/dL (ref 1.6–2.3)

## 2020-08-06 LAB — COMPREHENSIVE METABOLIC PANEL
ALT: 11 IU/L (ref 0–32)
AST: 16 IU/L (ref 0–40)
Albumin/Globulin Ratio: 1.9 (ref 1.2–2.2)
Albumin: 4.1 g/dL (ref 3.8–4.8)
Alkaline Phosphatase: 83 IU/L (ref 48–121)
BUN/Creatinine Ratio: 20 (ref 12–28)
BUN: 17 mg/dL (ref 8–27)
Bilirubin Total: <0.2 mg/dL (ref 0.0–1.2)
CO2: 28 mmol/L (ref 20–29)
Calcium: 9.1 mg/dL (ref 8.7–10.3)
Chloride: 91 mmol/L — ABNORMAL LOW (ref 96–106)
Creatinine, Ser: 0.87 mg/dL (ref 0.57–1.00)
GFR calc Af Amer: 81 mL/min/{1.73_m2} (ref >59)
GFR calc non Af Amer: 71 mL/min/{1.73_m2} (ref >59)
Globulin, Total: 2.2 g/dL (ref 1.5–4.5)
Glucose: 101 mg/dL — ABNORMAL HIGH (ref 65–99)
Potassium: 4.5 mmol/L (ref 3.5–5.2)
Sodium: 130 mmol/L — ABNORMAL LOW (ref 134–144)
Total Protein: 6.3 g/dL (ref 6.0–8.5)

## 2020-08-14 ENCOUNTER — Encounter (INDEPENDENT_AMBULATORY_CARE_PROVIDER_SITE_OTHER): Payer: BC Managed Care – PPO

## 2020-08-14 DIAGNOSIS — R55 Syncope and collapse: Secondary | ICD-10-CM

## 2020-09-05 ENCOUNTER — Other Ambulatory Visit: Payer: Self-pay

## 2020-09-05 ENCOUNTER — Encounter: Payer: Self-pay | Admitting: Cardiology

## 2020-09-05 ENCOUNTER — Ambulatory Visit (INDEPENDENT_AMBULATORY_CARE_PROVIDER_SITE_OTHER): Payer: BC Managed Care – PPO | Admitting: Cardiology

## 2020-09-05 VITALS — BP 108/68 | HR 64 | Ht 64.0 in | Wt 152.2 lb

## 2020-09-05 DIAGNOSIS — I493 Ventricular premature depolarization: Secondary | ICD-10-CM

## 2020-09-05 NOTE — Patient Instructions (Signed)
Medication Instructions:  Your physician recommends that you continue on your current medications as directed. Please refer to the Current Medication list given to you today.  *If you need a refill on your cardiac medications before your next appointment, please call your pharmacy*   Lab Work: None ordered   Testing/Procedures: None ordered   Follow-Up: At [facility], you and your health needs are our priority.  As part of our continuing mission to provide you with exceptional heart care, we have created designated Provider Care Teams.  These Care Teams include your primary Cardiologist (physician) and Advanced Practice Providers (APPs -  Physician Assistants and Nurse Practitioners) who all work together to provide you with the care you need, when you need it.  We recommend signing up for the patient portal called "MyChart".  Sign up information is provided on this After Visit Summary.  MyChart is used to connect with patients for Virtual Visits (Telemedicine).  Patients are able to view lab/test results, encounter notes, upcoming appointments, etc.  Non-urgent messages can be sent to your provider as well.   To learn more about what you can do with MyChart, go to [email or web address].    Your next appointment:   6 month(s)  The format for your next appointment:   In Person  Provider:   Will Camnitz, MD   Thank you for choosing [facility]!!   Traveon Louro, RN [phone]    Other Instructions    

## 2020-09-05 NOTE — Progress Notes (Signed)
Electrophysiology Office Note   Date:  09/05/2020   ID:  Emily, House 12-02-1956, MRN [id number]  PCP:  Aretta Nip, MD  Cardiologist:  Stanford Breed Primary Electrophysiologist:  Anush Wiedeman Meredith Leeds, MD    No chief complaint on file.    History of Present Illness: [name] is a 64 y.o. female who is being seen today for the evaluation of VT at the request of [name], [name], MD. Presenting today for electrophysiology evaluation. In the hospital on 04/30/17 after an exercise treadmill test resulting in ventricular tachycardia. She does have a history of seizures with bigeminy and PVCs. She gets occasional palpitations that last between 5 and 10 minutes. During stage III of her exercise treadmill test, she went into a sustained episode of ventricular tachycardia. The treadmill was stopped and her VT converted to sinus rhythm. VT was associated with shortness of breath. Had a cath that showed no evidence of coronary disease and a cardiac MRI that showed no evidence of scar.  Today, denies symptoms of palpitations, chest pain, shortness of breath, orthopnea, PND, lower extremity edema, claudication, dizziness, presyncope, bleeding, or neurologic sequela. The patient is tolerating medications without difficulties.  In early August, she had an episode of syncope while at church.  She felt unusually warm saying that she has to wear a sweater.  Walking downstairs, she felt short of breath.  The patient's husband explains that if she sits for any length of time that she falls asleep easily.  This occurred while at church.  She was found to have her head down to chest.  At the time, this did not seem unusual.  When they tried to wake her up, she would not arouse.  A church member got her to the floor and she awoke.  There was a physician at church who said that she had a weak pulse.  There was no comment on rate at that time.  She wore a cardiac monitor without major abnormality.  Since that  time, she has had a few falls.  She has a broken kneecap as well as a fractured wrist.  The knee issue happened a few months ago, but was just diagnosed.  She fractured her wrist after walking out the door.  Each time she slipped.  Her balance has been worse since starting her seizure medications.  Past Medical History:  Diagnosis Date  . Arthritis lower back/hands  . Brain injury ([place])   . Cancer [facility]) breast tumor removed  . Leg fracture   . Seizure disorder ([place])   . Seizures ([place])   . VT (ventricular tachycardia) ([place]) 04/29/2017   Past Surgical History:  Procedure Laterality Date  . BREAST SURGERY    . FACIAL FRACTURE SURGERY  orbital bone repair  . LEFT HEART CATH AND CORONARY ANGIOGRAPHY N/A 04/30/2017   Procedure: Left Heart Cath and Coronary Angiography;  Surgeon: Leonie Man, MD;  Location: [place] [facility];  Service: Cardiovascular;  Laterality: N/A;  . TOTAL HIP ARTHROPLASTY Left 09/07/2019   Procedure: TOTAL HIP ARTHROPLASTY ANTERIOR APPROACH;  Surgeon: Rod Can, MD;  Location: [facility];  Service: Orthopedics;  Laterality: Left;     Current Outpatient Medications  Medication Sig Dispense Refill  . calcium-vitamin D (OSCAL WITH D) 250-125 MG-UNIT tablet Take by mouth as directed.     . cloBAZam (ONFI) 10 MG tablet Take 1 tablet (10 mg) in the mornings and one and a half tablets (15 mg) at bedtime by moth  daily.    . divalproex (DEPAKOTE ER) 500 MG 24 hr tablet Take 1,000 mg by mouth at bedtime.     Marland Kitchen HYDROcodone-acetaminophen (NORCO/VICODIN) 5-325 MG tablet Take 1 tablet by mouth every 8 (eight) hours as needed.    Marland Kitchen omeprazole (PRILOSEC) 20 MG capsule Take 20 mg by mouth 2 (two) times daily.    . polyethylene glycol (MIRALAX / GLYCOLAX) packet Take 17 g by mouth daily.    . propafenone (RYTHMOL SR) 225 MG 12 hr capsule TAKE 1 CAPSULE (225 MG TOTAL) BY MOUTH 2 (TWO) TIMES DAILY. 180 capsule 2   No current facility-administered medications for this visit.     Allergies:   Patient has no known allergies.   Social History:  The patient  reports that she has never smoked. She has never used smokeless tobacco. She reports that she does not drink alcohol and does not use drugs.   Family History:  The patient's family history includes Heart disease in her mother; Heart failure in her mother; Pancreatic cancer in her father.   ROS:  Please see the history of present illness.   Otherwise, review of systems is positive for none.   All other systems are reviewed and negative.   PHYSICAL EXAM: VS:  BP 108/68   Pulse 64   Ht 5\' 4"  (1.626 m)   Wt 152 lb 3.2 oz (69 kg)   SpO2 95%   BMI 26.13 kg/m  , BMI Body mass index is 26.13 kg/m. GEN: Well nourished, well developed, in no acute distress  HEENT: normal  Neck: no JVD, carotid bruits, or masses Cardiac: RRR; no murmurs, rubs, or gallops,no edema  Respiratory:  clear to auscultation bilaterally, normal work of breathing GI: soft, nontender, nondistended, + BS MS: no deformity or atrophy  Skin: warm and dry Neuro:  Strength and sensation are intact Psych: euthymic mood, full affect  EKG:  EKG is not ordered today. Personal review of the ekg ordered 08/05/20 shows SR, iRBBB   Recent Labs: 07/12/2020: Hemoglobin 11.3; Platelets 210; TSH 4.340 08/05/2020: ALT 11; BUN 17; Creatinine, Ser 0.87; Magnesium 1.9; Potassium 4.5; Sodium 130    Lipid Panel     Component Value Date/Time   CHOL 188 04/30/2017 0338   TRIG 24 04/30/2017 0338   HDL 106 04/30/2017 0338   CHOLHDL 1.8 04/30/2017 0338   VLDL 5 04/30/2017 0338   LDLCALC 77 04/30/2017 0338     Wt Readings from Last 3 Encounters:  09/05/20 152 lb 3.2 oz (69 kg)  08/05/20 148 lb (67.1 kg)  07/09/20 155 lb (70.3 kg)      Other studies Reviewed: Additional studies/ records that were reviewed today include: TTE 05/02/17, Cath 04/30/17, ETT 05/11/17  Review of the above records today demonstrates:  - Left ventricle: The cavity size was normal.  Systolic function was   normal. The estimated ejection fraction was in the range of 60%   to 65%. Wall motion was normal; there were no regional wall   motion abnormalities. - Aortic valve: Trileaflet; mildly thickened, mildly calcified   leaflets. - Mitral valve: There was trivial regurgitation. - Right atrium: The atrium was mildly dilated. - Tricuspid valve: There was trivial regurgitation.    The left ventricular systolic function is normal. The left ventricular ejection fraction is greater than 65% by visual estimate.  LV end diastolic pressure is normal. 11 mmHg  Angiographically normal coronary arteries   Angiographic normal coronary arteries with normal LV function and EDP.  Blood pressure demonstrated a normal response to exercise.  No T wave inversion was noted during stress.  There was no ST segment deviation noted during stress.  The patient requested the test to be stopped.  Heart rate demonstrated pharmacologically blunted response to exercise  Overall, the patient's exercise capacity was normal.  Duke Treadmill Score: low risk   CMRI 05/03/17 1) Normal LV size and function EF 75%  2) Normal RV size and function no evidence of RV dysplasia  3) No delayed gadolinium enhancement in LV myocardium on inversion recovery sequences  4) Normal AV, MV and TV  5) Prominent epicardial fat pad  Cardiac monitor 08/28/2020 personally reviewed predomianant rhythm was sinus rhythm Zero atrial fibrillation noted <1% PVCs Symptoms of chest pain/pressure associated with sinus rhythm   ASSESSMENT AND PLAN:  1.  Ventricular tachycardia: Left heart cath and cardiac MRI without evidence of coronary disease or ventricular scar.  Currently on Rythmol.  Low burden on most recent monitor.  No changes.  2.  Syncope: Patient has a history of episodes where she falls asleep.  This time she was unable to be aroused, only awoke when she was moved to the floor.  She wore a  cardiac monitor that showed no major abnormalities.  I did offer them Linq monitoring, but they would like to hold off for now.  If she has any further episodes, we Ronna Herskowitz plan for Linq monitor implant.  Current medicines are reviewed at length with the patient today.   The patient does not have concerns regarding her medicines.  The following changes were made today: None  Labs/ tests ordered today include:  No orders of the defined types were placed in this encounter.    Disposition:   FU with Herny Scurlock 6 months  Signed, Wentworth Edelen Meredith Leeds, MD  09/05/2020 9:23 AM     [facility] [address] [place] [address] [phone] (office) [phone] (fax)

## 2020-09-06 ENCOUNTER — Other Ambulatory Visit: Payer: Self-pay | Admitting: Cardiology

## 2020-09-29 ENCOUNTER — Other Ambulatory Visit: Payer: Self-pay | Admitting: Cardiology

## 2020-10-26 ENCOUNTER — Other Ambulatory Visit: Payer: Self-pay | Admitting: Cardiology

## 2020-12-19 ENCOUNTER — Telehealth: Payer: Self-pay | Admitting: *Deleted

## 2020-12-19 NOTE — Telephone Encounter (Signed)
   [facility] Pre-operative Risk Assessment    HEARTCARE STAFF: - Please ensure there is not already an duplicate clearance open for this procedure. - Under Visit Info/Reason for Call, type in Other and utilize the format Clearance MM/DD/YY or Clearance TBD. Do not use dashes or single digits. - If request is for dental extraction, please clarify the # of teeth to be extracted.  Request for surgical clearance:  1. What type of surgery is being performed? ENDOSCOPY   2. When is this surgery scheduled? TBD   3. What type of clearance is required (medical clearance vs. Pharmacy clearance to hold med vs. Both)? MEDICAL  4. Are there any medications that need to be held prior to surgery and how long? NONE LISTED    5. Practice name and name of physician performing surgery? [facility]; DR.  Charlett Nose  6. What is the office phone number? [phone]   7.   What is the office fax number? [phone]  8.   Anesthesia type (None, local, MAC, general) ? PROPOFOL   [name] 12/19/2020, 2:32 PM  _________________________________________________________________   (provider comments below)

## 2020-12-19 NOTE — Telephone Encounter (Signed)
° °  Primary Cardiologist: Will Meredith Leeds, MD  Chart reviewed as part of pre-operative protocol coverage. Given past medical history and time since last visit, based on ACC/AHA guidelines, [name] would be at acceptable risk for the planned procedure without further cardiovascular testing.   Patient was advised that if she develops new symptoms prior to surgery to contact our office to arrange a follow-up appointment.  She verbalized understanding.  I will route this recommendation to the requesting party via Epic fax function and remove from pre-op pool.  Please call with questions.  Jossie Ng. Skylor Hughson NP-C    12/19/2020, 2:56 PM [place] [address] Office [phone] Fax [phone]

## 2021-01-10 DIAGNOSIS — R270 Ataxia, unspecified: Secondary | ICD-10-CM | POA: Diagnosis not present

## 2021-01-10 DIAGNOSIS — Z87828 Personal history of other (healed) physical injury and trauma: Secondary | ICD-10-CM | POA: Diagnosis not present

## 2021-01-10 DIAGNOSIS — W19XXXS Unspecified fall, sequela: Secondary | ICD-10-CM | POA: Diagnosis not present

## 2021-01-10 DIAGNOSIS — G40209 Localization-related (focal) (partial) symptomatic epilepsy and epileptic syndromes with complex partial seizures, not intractable, without status epilepticus: Secondary | ICD-10-CM | POA: Diagnosis not present

## 2021-01-24 DIAGNOSIS — M4802 Spinal stenosis, cervical region: Secondary | ICD-10-CM | POA: Diagnosis not present

## 2021-01-24 DIAGNOSIS — G9389 Other specified disorders of brain: Secondary | ICD-10-CM | POA: Diagnosis not present

## 2021-01-24 DIAGNOSIS — M47812 Spondylosis without myelopathy or radiculopathy, cervical region: Secondary | ICD-10-CM | POA: Diagnosis not present

## 2021-02-04 IMAGING — DX DG KNEE 1-2V PORT*L*
4 series · 4 of 4 positions shown · non-contrast
Comparison: None.

CLINICAL DATA: Fall with thigh pain

EXAM:
PORTABLE LEFT KNEE - 1-2 VIEW

[knee ap]
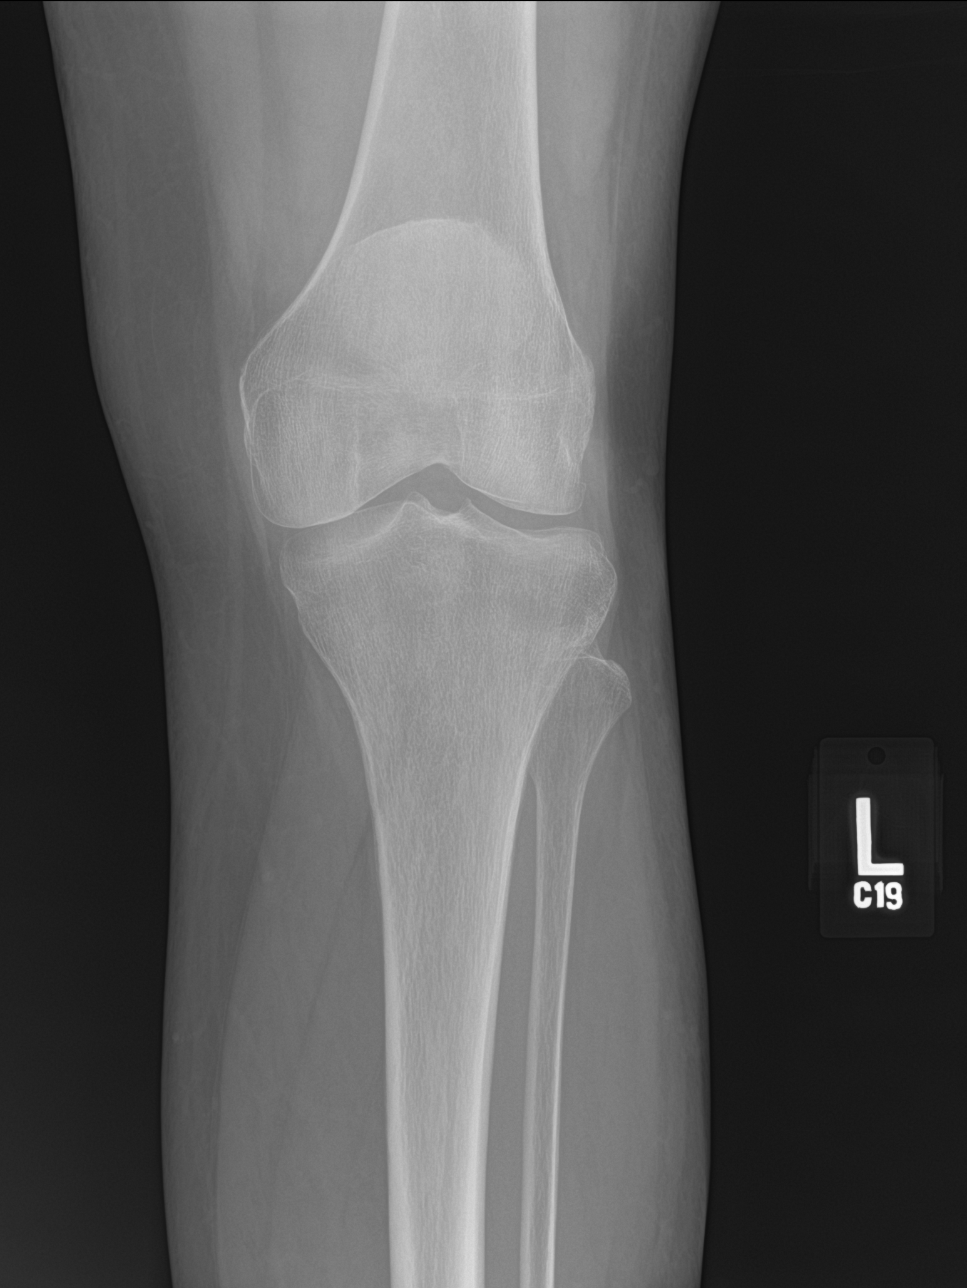

[knee obl (1 of 2)]
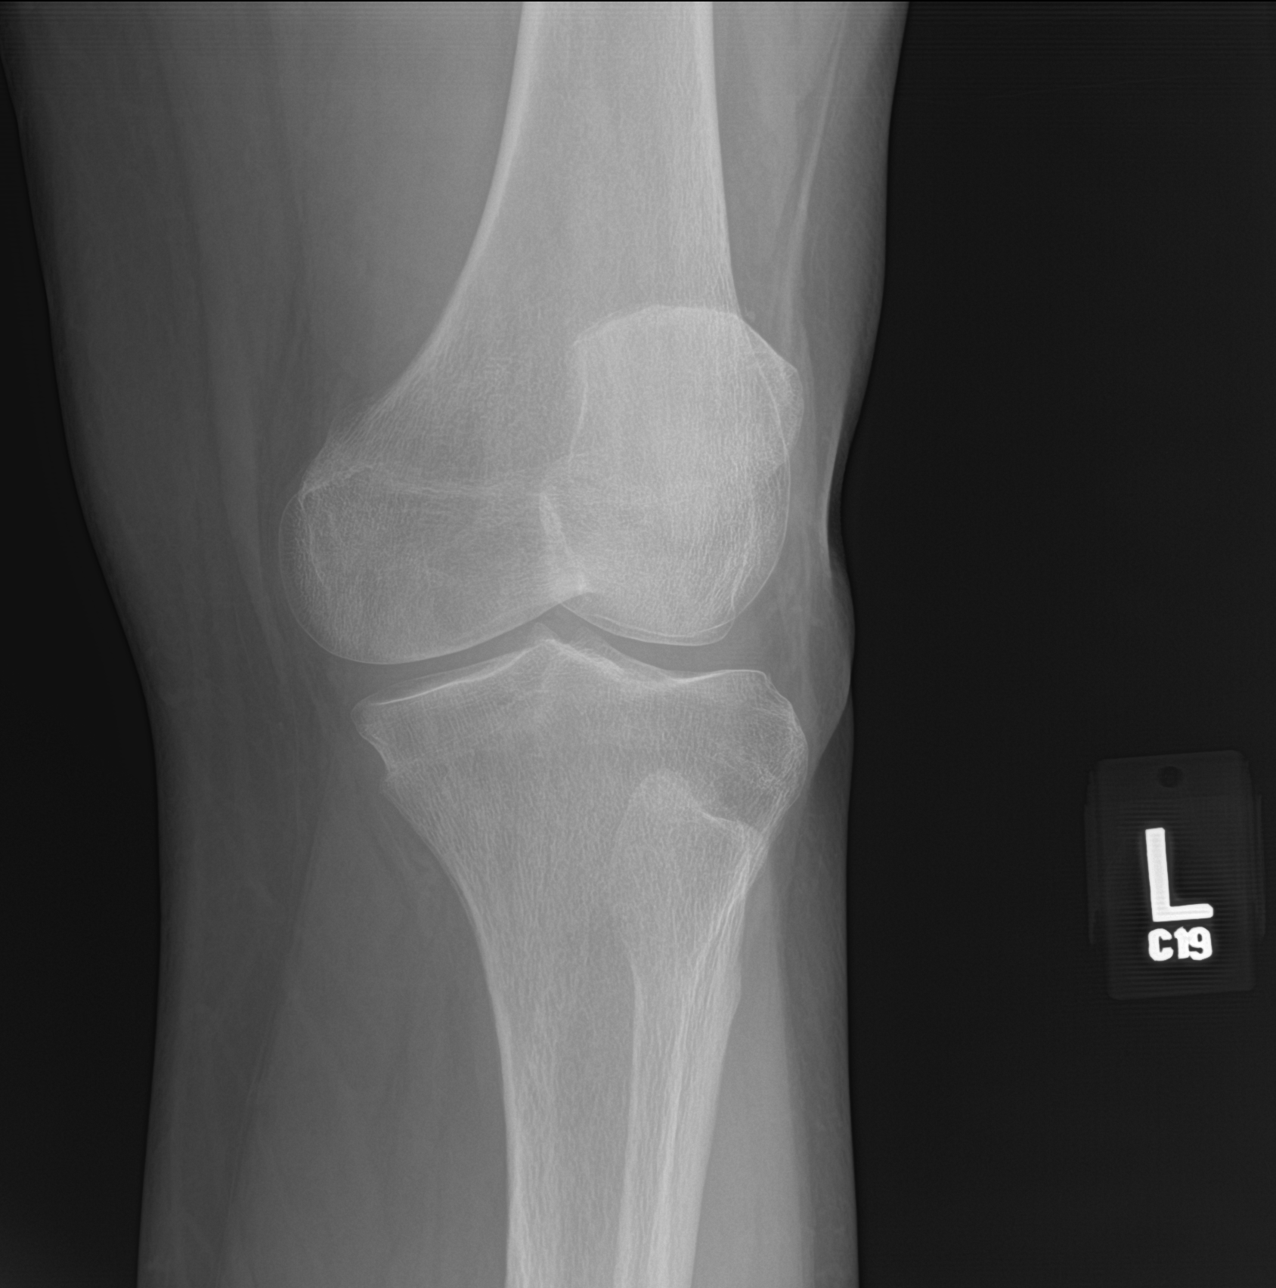

[knee obl (2 of 2)]
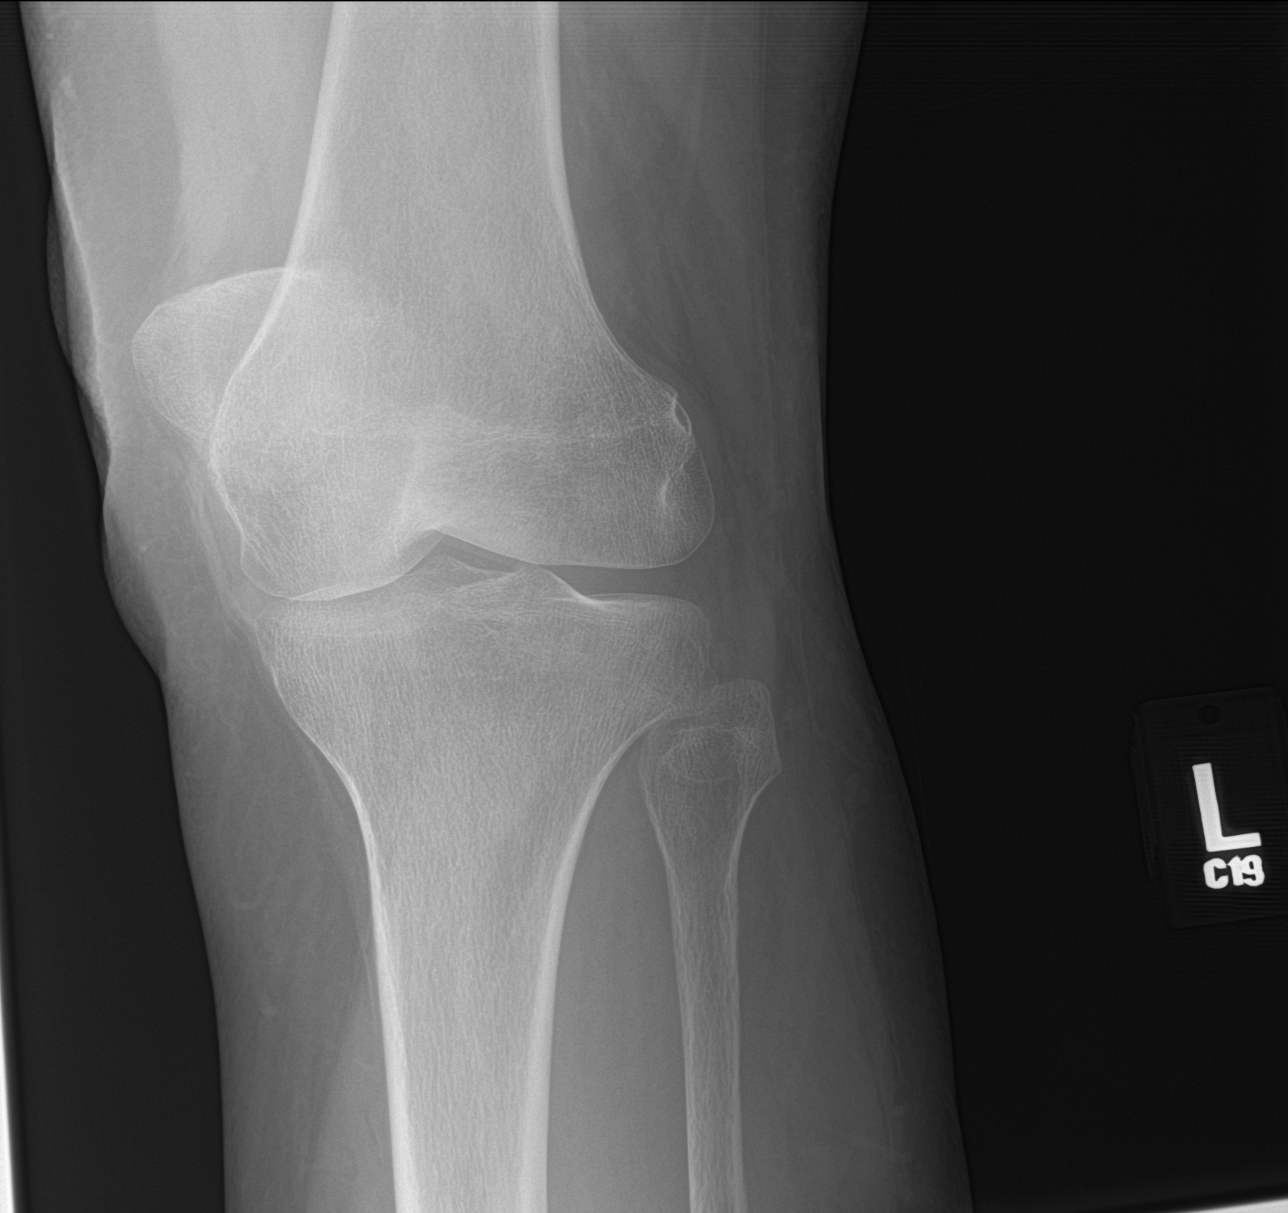

[knee lat]
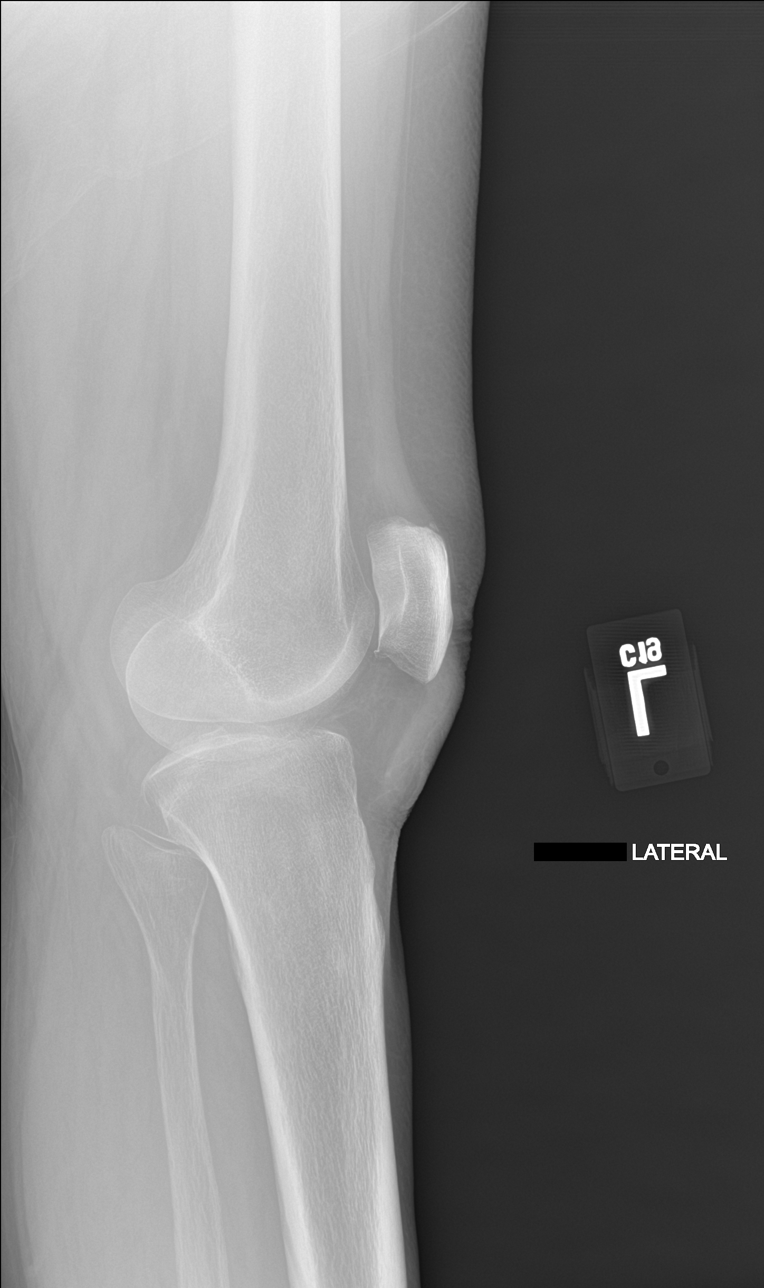

[4 of 4 positions shown; findings below may reference images not displayed]

FINDINGS: No fracture or malalignment. Minimal patellofemoral degenerative
change. No large knee effusion
IMPRESSION: No acute osseous abnormality.

## 2021-02-07 ENCOUNTER — Other Ambulatory Visit: Payer: Self-pay | Admitting: Cardiology

## 2021-02-26 DIAGNOSIS — K219 Gastro-esophageal reflux disease without esophagitis: Secondary | ICD-10-CM | POA: Diagnosis not present

## 2021-02-26 DIAGNOSIS — E611 Iron deficiency: Secondary | ICD-10-CM | POA: Diagnosis not present

## 2021-03-01 ENCOUNTER — Other Ambulatory Visit: Payer: Self-pay | Admitting: Cardiology

## 2021-03-07 ENCOUNTER — Encounter: Payer: Self-pay | Admitting: Cardiology

## 2021-03-07 ENCOUNTER — Other Ambulatory Visit: Payer: Self-pay

## 2021-03-07 ENCOUNTER — Ambulatory Visit (INDEPENDENT_AMBULATORY_CARE_PROVIDER_SITE_OTHER): Payer: Medicare PPO | Admitting: Cardiology

## 2021-03-07 VITALS — BP 106/68 | HR 65 | Wt 154.2 lb

## 2021-03-07 DIAGNOSIS — I472 Ventricular tachycardia, unspecified: Secondary | ICD-10-CM

## 2021-03-07 NOTE — Progress Notes (Signed)
Electrophysiology Office Note   Date:  03/07/2021   ID:  [name], [name] [birth year], MRN [id number]  PCP:  Aretta Nip, MD  Cardiologist:  Stanford Breed Primary Electrophysiologist:  Will Meredith Leeds, MD    No chief complaint on file.    History of Present Illness: [name] is a 65 y.o. female who is being seen today for the evaluation of VT at the request of [name], [name], MD. Presenting today for electrophysiology evaluation.   She was in the hospital 04/30/2017 after an exercise treadmill test showed episodes of ventricular tachycardia.  She has a history of seizures, bigeminy, and PVCs.  She gets occasional palpitations between 5 and 10 minutes.  On stage III of her treadmill test she had a sustained episode of ventricular tachycardia.  The treadmill was stopped and VT converted to sinus rhythm.  She had significant shortness of breath.  Left heart catheterization showed no coronary artery disease and cardiac MRI showed no evidence of scar.  Today, denies symptoms of palpitations, chest pain, shortness of breath, orthopnea, PND, lower extremity edema, claudication, dizziness, presyncope, syncope, bleeding, or neurologic sequela. The patient is tolerating medications without difficulties.  Since last being seen she has done well.  Unfortunately she had a fall and injured her kneecap.  She is wearing a knee brace.  Aside from that, she has no complaints today.  She has noted intermittent palpitations that last for a few minutes at a time.  Palpitations are less than monthly.   Past Medical History:  Diagnosis Date  . Arthritis lower back/hands  . Brain injury ([place])   . Cancer [facility]) breast tumor removed  . Leg fracture   . Seizure disorder ([place])   . Seizures ([place])   . VT (ventricular tachycardia) ([place]) 04/29/2017   Past Surgical History:  Procedure Laterality Date  . BREAST SURGERY    . FACIAL FRACTURE SURGERY  orbital bone repair  . LEFT HEART CATH AND  CORONARY ANGIOGRAPHY N/A 04/30/2017   Procedure: Left Heart Cath and Coronary Angiography;  Surgeon: Leonie Man, MD;  Location: [place] CV LAB;  Service: Cardiovascular;  Laterality: N/A;  . TOTAL HIP ARTHROPLASTY Left 09/07/2019   Procedure: TOTAL HIP ARTHROPLASTY ANTERIOR APPROACH;  Surgeon: Rod Can, MD;  Location[facility];  Service: Orthopedics;  Laterality: Left;     Current Outpatient Medications  Medication Sig Dispense Refill  . acetaminophen (TYLENOL) 500 MG tablet Take 2 tablets by mouth as needed for pain.    . calcium-vitamin D (OSCAL WITH D) 250-125 MG-UNIT tablet Take by mouth as directed.     . cloBAZam (ONFI) 10 MG tablet Take 1 tablet (10 mg) in the mornings and one and a half tablets (15 mg) at bedtime by moth daily.    . divalproex (DEPAKOTE ER) 500 MG 24 hr tablet Take 1,000 mg by mouth at bedtime.     Marland Kitchen HYDROcodone-acetaminophen (NORCO/VICODIN) 5-325 MG tablet Take 1 tablet by mouth every 8 (eight) hours as needed.    . lansoprazole (PREVACID) 30 MG capsule Take 1 capsule by mouth in the morning and at bedtime.    . melatonin 3 MG TABS tablet Take 1 tablet by mouth as needed for sleep.    . polyethylene glycol (MIRALAX / GLYCOLAX) packet Take 17 g by mouth daily.    . propafenone (RYTHMOL SR) 225 MG 12 hr capsule Take 1 capsule (225 mg total) by mouth 2 (two) times daily. 180 capsule 0  No current facility-administered medications for this visit.    Allergies:   Patient has no known allergies.   Social History:  The patient  reports that she has never smoked. She has never used smokeless tobacco. She reports that she does not drink alcohol and does not use drugs.   Family History:  The patient's family history includes Heart disease in her mother; Heart failure in her mother; Pancreatic cancer in her father.   ROS:  Please see the history of present illness.   Otherwise, review of systems is positive for none.   All other systems are reviewed and  negative.   PHYSICAL EXAM: VS:  There were no vitals taken for this visit. , BMI There is no height or weight on file to calculate BMI. GEN: Well nourished, well developed, in no acute distress  HEENT: normal  Neck: no JVD, carotid bruits, or masses Cardiac: RRR; no murmurs, rubs, or gallops,no edema  Respiratory:  clear to auscultation bilaterally, normal work of breathing GI: soft, nontender, nondistended, + BS MS: no deformity or atrophy  Skin: warm and dry Neuro:  Strength and sensation are intact Psych: euthymic mood, full affect  EKG:  EKG is ordered today. Personal review of the ekg ordered shows sinus rhythm, rate 65  Recent Labs: 07/12/2020: Hemoglobin 11.3; Platelets 210; TSH 4.340 08/05/2020: ALT 11; BUN 17; Creatinine, Ser 0.87; Magnesium 1.9; Potassium 4.5; Sodium 130    Lipid Panel     Component Value Date/Time   CHOL 188 04/30/2017 0338   TRIG 24 04/30/2017 0338   HDL 106 04/30/2017 0338   CHOLHDL 1.8 04/30/2017 0338   VLDL 5 04/30/2017 0338   LDLCALC 77 04/30/2017 0338     Wt Readings from Last 3 Encounters:  09/05/20 152 lb 3.2 oz (69 kg)  08/05/20 148 lb (67.1 kg)  07/09/20 155 lb (70.3 kg)      Other studies Reviewed: Additional studies/ records that were reviewed today include: TTE 05/02/17, Cath 04/30/17, ETT 05/11/17  Review of the above records today demonstrates:  - Left ventricle: The cavity size was normal. Systolic function was   normal. The estimated ejection fraction was in the range of 60%   to 65%. Wall motion was normal; there were no regional wall   motion abnormalities. - Aortic valve: Trileaflet; mildly thickened, mildly calcified   leaflets. - Mitral valve: There was trivial regurgitation. - Right atrium: The atrium was mildly dilated. - Tricuspid valve: There was trivial regurgitation.    The left ventricular systolic function is normal. The left ventricular ejection fraction is greater than 65% by visual estimate.  LV end  diastolic pressure is normal. 11 mmHg  Angiographically normal coronary arteries   Angiographic normal coronary arteries with normal LV function and EDP.   Blood pressure demonstrated a normal response to exercise.  No T wave inversion was noted during stress.  There was no ST segment deviation noted during stress.  The patient requested the test to be stopped.  Heart rate demonstrated pharmacologically blunted response to exercise  Overall, the patient's exercise capacity was normal.  Duke Treadmill Score: low risk   CMRI 05/03/17 1) Normal LV size and function EF 75%  2) Normal RV size and function no evidence of RV dysplasia  3) No delayed gadolinium enhancement in LV myocardium on inversion recovery sequences  4) Normal AV, MV and TV  5) Prominent epicardial fat pad  Cardiac monitor 08/28/2020 personally reviewed predomianant rhythm was sinus rhythm Zero atrial fibrillation  noted <1% PVCs Symptoms of chest pain/pressure associated with sinus rhythm   ASSESSMENT AND PLAN:  1.  Ventricular tachycardia: Left heart cath and cardiac MRI without evidence of coronary artery disease or particular scar.  Currently on Rythmol.  High risk medication monitoring.  Low burden on most recent monitor.  No changes.  She is having intermittent palpitations but they are quite rare.  No symptoms similar to her VT.  No changes.  2.  Syncope: Prior episodes where she falls asleep and is difficult to arouse.  Linq monitor has been offered, but patient refused.  Nothing noted on cardiac monitor.  No changes.    Current medicines are reviewed at length with the patient today.   The patient does not have concerns regarding her medicines.  The following changes were made today: None  Labs/ tests ordered today include:  Orders Placed This Encounter  Procedures  . EKG 12-Lead     Disposition:   FU with Will Camnitz 6 months  Signed, Will Meredith Leeds, MD  03/07/2021 10:12 AM      [facility] [address] [place] [address] [phone] (office) [phone] (fax)

## 2021-03-07 NOTE — Patient Instructions (Signed)
Medication Instructions:  °Your physician recommends that you continue on your current medications as directed. Please refer to the Current Medication list given to you today. ° °*If you need a refill on your cardiac medications before your next appointment, please call your pharmacy* ° ° °Lab Work: °None ordered ° ° °Testing/Procedures: °None ordered ° ° °Follow-Up: °At [facility], you and your health needs are our priority.  As part of our continuing mission to provide you with exceptional heart care, we have created designated Provider Care Teams.  These Care Teams include your primary Cardiologist (physician) and Advanced Practice Providers (APPs -  Physician Assistants and Nurse Practitioners) who all work together to provide you with the care you need, when you need it. ° °Your next appointment:   °6 month(s) ° °The format for your next appointment:   °In Person ° °Provider:   °Will Camnitz, MD ° ° ° °Thank you for choosing [facility]!! ° ° °Khayden Herzberg, RN °[phone] °  °

## 2021-04-02 DIAGNOSIS — M1990 Unspecified osteoarthritis, unspecified site: Secondary | ICD-10-CM | POA: Diagnosis not present

## 2021-04-02 DIAGNOSIS — M25561 Pain in right knee: Secondary | ICD-10-CM | POA: Diagnosis not present

## 2021-04-28 DIAGNOSIS — M1711 Unilateral primary osteoarthritis, right knee: Secondary | ICD-10-CM | POA: Diagnosis not present

## 2021-04-28 DIAGNOSIS — M170 Bilateral primary osteoarthritis of knee: Secondary | ICD-10-CM | POA: Diagnosis not present

## 2021-04-29 ENCOUNTER — Other Ambulatory Visit: Payer: Self-pay | Admitting: Cardiology

## 2021-04-30 DIAGNOSIS — Z01419 Encounter for gynecological examination (general) (routine) without abnormal findings: Secondary | ICD-10-CM | POA: Diagnosis not present

## 2021-05-02 DIAGNOSIS — Z01812 Encounter for preprocedural laboratory examination: Secondary | ICD-10-CM | POA: Diagnosis not present

## 2021-05-05 DIAGNOSIS — K219 Gastro-esophageal reflux disease without esophagitis: Secondary | ICD-10-CM | POA: Diagnosis not present

## 2021-05-05 DIAGNOSIS — K449 Diaphragmatic hernia without obstruction or gangrene: Secondary | ICD-10-CM | POA: Diagnosis not present

## 2021-05-05 DIAGNOSIS — K317 Polyp of stomach and duodenum: Secondary | ICD-10-CM | POA: Diagnosis not present

## 2021-05-09 DIAGNOSIS — K317 Polyp of stomach and duodenum: Secondary | ICD-10-CM | POA: Diagnosis not present

## 2021-05-19 DIAGNOSIS — Z23 Encounter for immunization: Secondary | ICD-10-CM | POA: Diagnosis not present

## 2021-07-18 DIAGNOSIS — M170 Bilateral primary osteoarthritis of knee: Secondary | ICD-10-CM | POA: Diagnosis not present

## 2021-08-05 DIAGNOSIS — I471 Supraventricular tachycardia: Secondary | ICD-10-CM | POA: Diagnosis not present

## 2021-08-05 DIAGNOSIS — Z7409 Other reduced mobility: Secondary | ICD-10-CM | POA: Diagnosis not present

## 2021-08-05 DIAGNOSIS — R030 Elevated blood-pressure reading, without diagnosis of hypertension: Secondary | ICD-10-CM | POA: Diagnosis not present

## 2021-08-05 DIAGNOSIS — K219 Gastro-esophageal reflux disease without esophagitis: Secondary | ICD-10-CM | POA: Diagnosis not present

## 2021-08-05 DIAGNOSIS — Z809 Family history of malignant neoplasm, unspecified: Secondary | ICD-10-CM | POA: Diagnosis not present

## 2021-08-05 DIAGNOSIS — R569 Unspecified convulsions: Secondary | ICD-10-CM | POA: Diagnosis not present

## 2021-08-05 DIAGNOSIS — M1990 Unspecified osteoarthritis, unspecified site: Secondary | ICD-10-CM | POA: Diagnosis not present

## 2021-08-05 DIAGNOSIS — Z8249 Family history of ischemic heart disease and other diseases of the circulatory system: Secondary | ICD-10-CM | POA: Diagnosis not present

## 2021-09-04 DIAGNOSIS — R197 Diarrhea, unspecified: Secondary | ICD-10-CM | POA: Diagnosis not present

## 2021-09-04 DIAGNOSIS — Z6827 Body mass index (BMI) 27.0-27.9, adult: Secondary | ICD-10-CM | POA: Diagnosis not present

## 2021-09-09 ENCOUNTER — Other Ambulatory Visit: Payer: Self-pay

## 2021-09-09 ENCOUNTER — Encounter: Payer: Self-pay | Admitting: Cardiology

## 2021-09-09 ENCOUNTER — Ambulatory Visit (INDEPENDENT_AMBULATORY_CARE_PROVIDER_SITE_OTHER): Payer: Medicare PPO | Admitting: Cardiology

## 2021-09-09 VITALS — BP 112/64 | HR 72 | Ht 64.0 in | Wt 158.2 lb

## 2021-09-09 DIAGNOSIS — I493 Ventricular premature depolarization: Secondary | ICD-10-CM

## 2021-09-09 NOTE — Patient Instructions (Signed)
Medication Instructions:  °Your physician recommends that you continue on your current medications as directed. Please refer to the Current Medication list given to you today. ° °*If you need a refill on your cardiac medications before your next appointment, please call your pharmacy* ° ° °Lab Work: °None ordered ° ° °Testing/Procedures: °None ordered ° ° °Follow-Up: °At [facility], you and your health needs are our priority.  As part of our continuing mission to provide you with exceptional heart care, we have created designated Provider Care Teams.  These Care Teams include your primary Cardiologist (physician) and Advanced Practice Providers (APPs -  Physician Assistants and Nurse Practitioners) who all work together to provide you with the care you need, when you need it. ° °Your next appointment:   °6 month(s) ° °The format for your next appointment:   °In Person ° °Provider:   °Will Camnitz, MD ° ° ° °Thank you for choosing [facility]!! ° ° °Dalante Minus, RN °[phone] °  °

## 2021-09-09 NOTE — Progress Notes (Signed)
Electrophysiology Office Note   Date:  09/09/2021   ID:  [name], [name] Mar 09, 1956, MRN [id number]  PCP:  Aretta Nip, MD  Cardiologist:  Stanford Breed Primary Electrophysiologist:  Odis Wickey Meredith Leeds, MD    No chief complaint on file.    History of Present Illness: [name] is a 65 y.o. female who is being seen today for the evaluation of VT at the request of [name], [name], MD. Presenting today for electrophysiology evaluation.   She was in the hospital 04/30/2017 with after an exercise treadmill test showed episodes of ventricular tachycardia.  She has a history of seizures, bigeminy, and PVCs.  She gets occasional palpitations between 5 and 10 minutes.  On stage III of her treadmill test, she developed a sustained episodes of ventricular tachycardia.  Treadmill test was stopped and she converted to sinus rhythm.  She had significant shortness of breath.  Left heart catheterization and cardiac MRI showed no evidence of disease.  Today, denies symptoms of palpitations, chest pain, shortness of breath, orthopnea, PND, lower extremity edema, claudication, dizziness, presyncope, syncope, bleeding, or neurologic sequela. The patient is tolerating medications without difficulties.  Since being seen she has done well.  She has had no chest pain or shortness of breath.  She is able do all of her daily activities without restriction.  Her main concern is arthritis in both of her knees and reflux.  She does state that she gets short of breath when climbing 3 flights of stairs, but she feels that this could be due to her arthritis and being out of shape.   Past Medical History:  Diagnosis Date   Arthritis lower back/hands   Brain injury ([place])    Cancer ([place]) breast tumor removed   Leg fracture    Seizure disorder ([place])    Seizures ([place])    VT (ventricular tachycardia) ([place]) 04/29/2017   Past Surgical History:  Procedure Laterality Date   BREAST SURGERY     FACIAL FRACTURE  SURGERY  orbital bone repair   LEFT HEART CATH AND CORONARY ANGIOGRAPHY N/A 04/30/2017   Procedure: Left Heart Cath and Coronary Angiography;  Surgeon: Leonie Man, MD;  Location: [facility];  Service: Cardiovascular;  Laterality: N/A;   TOTAL HIP ARTHROPLASTY Left 09/07/2019   Procedure: TOTAL HIP ARTHROPLASTY ANTERIOR APPROACH;  Surgeon: Rod Can, MD;  Location: [facility];  Service: Orthopedics;  Laterality: Left;     Current Outpatient Medications  Medication Sig Dispense Refill   acetaminophen (TYLENOL) 500 MG tablet Take 2 tablets by mouth as needed for pain.     calcium-vitamin D (OSCAL WITH D) 250-125 MG-UNIT tablet Take by mouth as directed.      cloBAZam (ONFI) 10 MG tablet Take 1 tablet (10 mg) in the mornings and one and a half tablets (15 mg) at bedtime by moth daily.     divalproex (DEPAKOTE ER) 500 MG 24 hr tablet Take 1,000 mg by mouth at bedtime.      HYDROcodone-acetaminophen (NORCO/VICODIN) 5-325 MG tablet Take 1 tablet by mouth every 8 (eight) hours as needed.     lansoprazole (PREVACID) 30 MG capsule Take 1 capsule by mouth in the morning and at bedtime.     melatonin 3 MG TABS tablet Take 1 tablet by mouth as needed for sleep.     polyethylene glycol (MIRALAX / GLYCOLAX) packet Take 17 g by mouth daily.     propafenone (RYTHMOL SR) 225 MG 12 hr capsule TAKE  1 CAPSULE BY MOUTH 2 TIMES DAILY. 180 capsule 3   No current facility-administered medications for this visit.    Allergies:   Patient has no known allergies.   Social History:  The patient  reports that she has never smoked. She has never used smokeless tobacco. She reports that she does not drink alcohol and does not use drugs.   Family History:  The patient's family history includes Heart disease in her mother; Heart failure in her mother; Pancreatic cancer in her father.   ROS:  Please see the history of present illness.   Otherwise, review of systems is positive for none.   All other systems are  reviewed and negative.   PHYSICAL EXAM: VS:  There were no vitals taken for this visit. , BMI There is no height or weight on file to calculate BMI. GEN: Well nourished, well developed, in no acute distress  HEENT: normal  Neck: no JVD, carotid bruits, or masses Cardiac: RRR; no murmurs, rubs, or gallops,no edema  Respiratory:  clear to auscultation bilaterally, normal work of breathing GI: soft, nontender, nondistended, + BS MS: no deformity or atrophy  Skin: warm and dry Neuro:  Strength and sensation are intact Psych: euthymic mood, full affect  EKG:  EKG is ordered today. Personal review of the ekg ordered shows sinus rhythm, rate 72  Recent Labs: No results found for requested labs within last 8760 hours.    Lipid Panel     Component Value Date/Time   CHOL 188 04/30/2017 0338   TRIG 24 04/30/2017 0338   HDL 106 04/30/2017 0338   CHOLHDL 1.8 04/30/2017 0338   VLDL 5 04/30/2017 0338   LDLCALC 77 04/30/2017 0338     Wt Readings from Last 3 Encounters:  03/07/21 154 lb 3.2 oz (69.9 kg)  09/05/20 152 lb 3.2 oz (69 kg)  08/05/20 148 lb (67.1 kg)      Other studies Reviewed: Additional studies/ records that were reviewed today include: TTE 05/02/17, Cath 04/30/17, ETT 05/11/17  Review of the above records today demonstrates:  - Left ventricle: The cavity size was normal. Systolic function was   normal. The estimated ejection fraction was in the range of 60%   to 65%. Wall motion was normal; there were no regional wall   motion abnormalities. - Aortic valve: Trileaflet; mildly thickened, mildly calcified   leaflets. - Mitral valve: There was trivial regurgitation. - Right atrium: The atrium was mildly dilated. - Tricuspid valve: There was trivial regurgitation.   The left ventricular systolic function is normal. The left ventricular ejection fraction is greater than 65% by visual estimate. LV end diastolic pressure is normal. 11 mmHg Angiographically normal coronary  arteries   Angiographic normal coronary arteries with normal LV function and EDP.  Blood pressure demonstrated a normal response to exercise. No T wave inversion was noted during stress. There was no ST segment deviation noted during stress. The patient requested the test to be stopped. Heart rate demonstrated pharmacologically blunted response to exercise Overall, the patient's exercise capacity was normal. Duke Treadmill Score: low risk   CMRI 05/03/17 1) Normal LV size and function EF 75%   2) Normal RV size and function no evidence of RV dysplasia   3) No delayed gadolinium enhancement in LV myocardium on inversion recovery sequences   4) Normal AV, MV and TV   5) Prominent epicardial fat pad  Cardiac monitor 08/28/2020 personally reviewed predomianant rhythm was sinus rhythm Zero atrial fibrillation noted <1% PVCs  Symptoms of chest pain/pressure associated with sinus rhythm   ASSESSMENT AND PLAN:  1.  Ventricular tachycardia: Left heart catheterization and cardiac MRI without evidence of coronary artery disease or scar.  Currently on Rythmol 225 mg twice daily.  High risk medication monitoring with ECG.  Low burden on recent cardiac monitor.  No changes at this time.  2.  Syncope: Has had prior episodes where she falls asleep and is difficult to arouse.  Linq monitoring has been offered but the patient has refused.  Nothing noted on cardiac monitor.  No changes.  Current medicines are reviewed at length with the patient today.   The patient does not have concerns regarding her medicines.  The following changes were made today: none  Labs/ tests ordered today include:  No orders of the defined types were placed in this encounter.    Disposition:   FU with Siomara Burkel 6 months  Signed, Aryannah Mohon Meredith Leeds, MD  09/09/2021 8:23 AM     [facility] [address] [place] [address] [phone] (office) [phone] (fax)

## 2021-09-30 DIAGNOSIS — M170 Bilateral primary osteoarthritis of knee: Secondary | ICD-10-CM | POA: Diagnosis not present

## 2021-09-30 DIAGNOSIS — M1711 Unilateral primary osteoarthritis, right knee: Secondary | ICD-10-CM | POA: Diagnosis not present

## 2021-09-30 DIAGNOSIS — M1712 Unilateral primary osteoarthritis, left knee: Secondary | ICD-10-CM | POA: Diagnosis not present

## 2022-01-05 DIAGNOSIS — M170 Bilateral primary osteoarthritis of knee: Secondary | ICD-10-CM | POA: Diagnosis not present

## 2022-01-05 DIAGNOSIS — M1712 Unilateral primary osteoarthritis, left knee: Secondary | ICD-10-CM | POA: Diagnosis not present

## 2022-01-05 DIAGNOSIS — M1711 Unilateral primary osteoarthritis, right knee: Secondary | ICD-10-CM | POA: Diagnosis not present

## 2022-01-08 DIAGNOSIS — R1310 Dysphagia, unspecified: Secondary | ICD-10-CM | POA: Diagnosis not present

## 2022-01-08 DIAGNOSIS — K219 Gastro-esophageal reflux disease without esophagitis: Secondary | ICD-10-CM | POA: Diagnosis not present

## 2022-01-15 DIAGNOSIS — Z1231 Encounter for screening mammogram for malignant neoplasm of breast: Secondary | ICD-10-CM | POA: Diagnosis not present

## 2022-02-05 DIAGNOSIS — H9201 Otalgia, right ear: Secondary | ICD-10-CM | POA: Diagnosis not present

## 2022-02-05 DIAGNOSIS — J309 Allergic rhinitis, unspecified: Secondary | ICD-10-CM | POA: Diagnosis not present

## 2022-03-02 DIAGNOSIS — Z87828 Personal history of other (healed) physical injury and trauma: Secondary | ICD-10-CM | POA: Diagnosis not present

## 2022-03-02 DIAGNOSIS — R296 Repeated falls: Secondary | ICD-10-CM | POA: Diagnosis not present

## 2022-03-02 DIAGNOSIS — G40909 Epilepsy, unspecified, not intractable, without status epilepticus: Secondary | ICD-10-CM | POA: Diagnosis not present

## 2022-03-02 DIAGNOSIS — R600 Localized edema: Secondary | ICD-10-CM | POA: Diagnosis not present

## 2022-03-02 DIAGNOSIS — H9201 Otalgia, right ear: Secondary | ICD-10-CM | POA: Diagnosis not present

## 2022-03-02 DIAGNOSIS — R419 Unspecified symptoms and signs involving cognitive functions and awareness: Secondary | ICD-10-CM | POA: Diagnosis not present

## 2022-03-02 DIAGNOSIS — G40209 Localization-related (focal) (partial) symptomatic epilepsy and epileptic syndromes with complex partial seizures, not intractable, without status epilepticus: Secondary | ICD-10-CM | POA: Diagnosis not present

## 2022-03-05 DIAGNOSIS — R030 Elevated blood-pressure reading, without diagnosis of hypertension: Secondary | ICD-10-CM | POA: Diagnosis not present

## 2022-03-05 DIAGNOSIS — K5900 Constipation, unspecified: Secondary | ICD-10-CM | POA: Diagnosis not present

## 2022-03-05 DIAGNOSIS — M1990 Unspecified osteoarthritis, unspecified site: Secondary | ICD-10-CM | POA: Diagnosis not present

## 2022-03-05 DIAGNOSIS — I471 Supraventricular tachycardia: Secondary | ICD-10-CM | POA: Diagnosis not present

## 2022-03-05 DIAGNOSIS — G3184 Mild cognitive impairment, so stated: Secondary | ICD-10-CM | POA: Diagnosis not present

## 2022-03-05 DIAGNOSIS — K219 Gastro-esophageal reflux disease without esophagitis: Secondary | ICD-10-CM | POA: Diagnosis not present

## 2022-03-05 DIAGNOSIS — G40909 Epilepsy, unspecified, not intractable, without status epilepticus: Secondary | ICD-10-CM | POA: Diagnosis not present

## 2022-03-05 DIAGNOSIS — G4700 Insomnia, unspecified: Secondary | ICD-10-CM | POA: Diagnosis not present

## 2022-03-05 DIAGNOSIS — G8929 Other chronic pain: Secondary | ICD-10-CM | POA: Diagnosis not present

## 2022-03-13 ENCOUNTER — Other Ambulatory Visit: Payer: Self-pay

## 2022-03-13 ENCOUNTER — Encounter: Payer: Self-pay | Admitting: Cardiology

## 2022-03-13 ENCOUNTER — Ambulatory Visit: Payer: Medicare PPO | Admitting: Cardiology

## 2022-03-13 VITALS — BP 102/66 | HR 69 | Ht 64.0 in | Wt 160.0 lb

## 2022-03-13 DIAGNOSIS — R2242 Localized swelling, mass and lump, left lower limb: Secondary | ICD-10-CM

## 2022-03-13 DIAGNOSIS — I472 Ventricular tachycardia, unspecified: Secondary | ICD-10-CM

## 2022-03-13 NOTE — Patient Instructions (Signed)
Medication Instructions:  ?Your physician recommends that you continue on your current medications as directed. Please refer to the Current Medication list given to you today. ? ?*If you need a refill on your cardiac medications before your next appointment, please call your pharmacy* ? ? ?Lab Work: ?None ordered ? ? ?Testing/Procedures: ?Your physician has requested that you have a lower or upper extremity venous duplex. This test is an ultrasound of the veins in the legs or arms. It looks at venous blood flow that carries blood from the heart to the legs or arms. Allow one hour for a Lower Venous exam. Allow thirty minutes for an Upper Venous exam. There are no restrictions or special instructions. ? ? ? ? ?Follow-Up: ?At [facility], you and your health needs are our priority.  As part of our continuing mission to provide you with exceptional heart care, we have created designated Provider Care Teams.  These Care Teams include your primary Cardiologist (physician) and Advanced Practice Providers (APPs -  Physician Assistants and Nurse Practitioners) who all work together to provide you with the care you need, when you need it. ? ?Your next appointment:   ?6 month(s) ? ?The format for your next appointment:   ?In Person ? ?Provider:   ?You will see one of the following Advanced Practice Providers on your designated Care Team:   ?Tommye Standard, PA-C ?Legrand Como "Jonni Sanger" Nibley, PA-C ? ? ? ?Thank you for choosing [facility]!! ? ? ?Trinidad Curet, RN ?[phone] ? ?  ?

## 2022-03-13 NOTE — Progress Notes (Signed)
? ?Electrophysiology Office Note ? ? ?Date:  03/13/2022  ? ?ID:  [name], DOB 1956/10/16, MRN [id number] ? ?PCP:  Aretta Nip, MD  ?Cardiologist:  Stanford Breed ?Primary Electrophysiologist:  Zyiah Withington Meredith Leeds, MD   ? ?No chief complaint on file. ? ? ?  ?History of Present Illness: ?[name] is a 66 y.o. female who is being seen today for the evaluation of VT at the request of [name], [name], MD. Presenting today for electrophysiology evaluation.  ? ?She was in the hospital 04/30/2017 after an exercise treadmill test showed episodes of ventricular tachycardia.  She has a history of seizures, bigeminy, PVCs.  She gets occasional palpitations between 5 and 10 minutes.  On stage III of her treadmill test she did develop sustained ventricular tachycardia.  Treadmill test was stopped and she converted to sinus rhythm.  She had significant shortness of breath.  Cardiac catheterization and MRI showed no evidence of coronary artery disease.  She was started on propafenone. ? ?Today, denies symptoms of palpitations, chest pain, shortness of breath, orthopnea, PND, claudication, dizziness, presyncope, syncope, bleeding, or neurologic sequela. The patient is tolerating medications without difficulties.  Since being seen she has not noted any further arrhythmias.  She has not had palpitations.  Unfortunately, she has noted left lower extremity swelling that is worsened over the last week and a half.  She says that it is quite painful.  She also has been having falls.  Her falls occur when she is standing.  Not occurring when she is exerting herself or walking.  She is not tripping.  She is also not losing consciousness when she falls. ? ? ?Past Medical History:  ?Diagnosis Date  ? Arthritis lower back/hands  ? Brain injury   ? Cancer [facility]) breast tumor removed  ? Leg fracture   ? Seizure disorder ([place])   ? Seizures ([place])   ? VT (ventricular tachycardia) 04/29/2017  ? ?Past Surgical History:  ?Procedure  Laterality Date  ? BREAST SURGERY    ? FACIAL FRACTURE SURGERY  orbital bone repair  ? LEFT HEART CATH AND CORONARY ANGIOGRAPHY N/A 04/30/2017  ? Procedure: Left Heart Cath and Coronary Angiography;  Surgeon: Leonie Man, MD;  Location: [facility];  Service: Cardiovascular;  Laterality: N/A;  ? TOTAL HIP ARTHROPLASTY Left 09/07/2019  ? Procedure: TOTAL HIP ARTHROPLASTY ANTERIOR APPROACH;  Surgeon: Rod Can, MD;  [facility];  Service: Orthopedics;  Laterality: Left;  ? ? ? ?Current Outpatient Medications  ?Medication Sig Dispense Refill  ? acetaminophen (TYLENOL) 500 MG tablet Take 2 tablets by mouth as needed for pain.    ? CALCIUM PO Take 1 capsule by mouth daily.    ? cloBAZam (ONFI) 10 MG tablet Take 1 tablet (10 mg) in the mornings and one and a half tablets (15 mg) at bedtime by moth daily.    ? divalproex (DEPAKOTE ER) 500 MG 24 hr tablet Take 1,000 mg by mouth at bedtime.     ? Melatonin 10 MG CAPS Take 1 capsule by mouth at bedtime.    ? Multiple Vitamin (MULTIVITAMIN) capsule Take 1 capsule by mouth daily.    ? Pantoprazole Sodium (PROTONIX PO) Take 1 capsule by mouth 2 (two) times daily.    ? polyethylene glycol (MIRALAX / GLYCOLAX) packet Take 17 g by mouth daily.    ? propafenone (RYTHMOL SR) 225 MG 12 hr capsule TAKE 1 CAPSULE BY MOUTH 2 TIMES DAILY. 180 capsule 3  ?  VITAMIN D PO Take 1 capsule by mouth daily.    ? ?No current facility-administered medications for this visit.  ? ? ?Allergies:   Patient has no known allergies.  ? ?Social History:  The patient  reports that she has never smoked. She has never used smokeless tobacco. She reports that she does not drink alcohol and does not use drugs.  ? ?Family History:  The patient's family history includes Heart disease in her mother; Heart failure in her mother; Pancreatic cancer in her father.  ? ?ROS:  Please see the history of present illness.   Otherwise, review of systems is positive for none.   All other systems are reviewed  and negative.  ? ?PHYSICAL EXAM: ?VS:  BP 102/66   Pulse 69   Ht '5\' 4"'$  (1.626 m)   Wt 160 lb (72.6 kg)   SpO2 95%   BMI 27.46 kg/m?  , BMI Body mass index is 27.46 kg/m?. ?GEN: Well nourished, well developed, in no acute distress  ?HEENT: normal  ?Neck: no JVD, carotid bruits, or masses ?Cardiac: RRR; no murmurs, rubs, or gallops, 1+ left ankle swelling  ?respiratory:  clear to auscultation bilaterally, normal work of breathing ?GI: soft, nontender, nondistended, + BS ?MS: no deformity or atrophy  ?Skin: warm and dry ?Neuro:  Strength and sensation are intact ?Psych: euthymic mood, full affect ? ?EKG:  EKG is ordered today. ?Personal review of the ekg ordered shows sinus rhythm, rate 69 ? ?Recent Labs: ?No results found for requested labs within last 8760 hours.  ? ? ?Lipid Panel  ?   ?Component Value Date/Time  ? CHOL 188 04/30/2017 0338  ? TRIG 24 04/30/2017 0338  ? HDL 106 04/30/2017 0338  ? CHOLHDL 1.8 04/30/2017 0338  ? VLDL 5 04/30/2017 0338  ? [place] 77 04/30/2017 0338  ? ? ? ?Wt Readings from Last 3 Encounters:  ?03/13/22 160 lb (72.6 kg)  ?09/09/21 158 lb 3.2 oz (71.8 kg)  ?03/07/21 154 lb 3.2 oz (69.9 kg)  ?  ? ? ?Other studies Reviewed: ?Additional studies/ records that were reviewed today include: TTE 05/02/17, Cath 04/30/17, ETT 05/11/17  ?Review of the above records today demonstrates:  ?- Left ventricle: The cavity size was normal. Systolic function was ?  normal. The estimated ejection fraction was in the range of 60% ?  to 65%. Wall motion was normal; there were no regional wall ?  motion abnormalities. ?- Aortic valve: Trileaflet; mildly thickened, mildly calcified ?  leaflets. ?- Mitral valve: There was trivial regurgitation. ?- Right atrium: The atrium was mildly dilated. ?- Tricuspid valve: There was trivial regurgitation. ? ? ?The left ventricular systolic function is normal. The left ventricular ejection fraction is greater than 65% by visual estimate. ?LV end diastolic pressure is normal. 11  mmHg ?Angiographically normal coronary arteries ?  ?Angiographic normal coronary arteries with normal LV function and EDP. ? ?Blood pressure demonstrated a normal response to exercise. ?No T wave inversion was noted during stress. ?There was no ST segment deviation noted during stress. ?The patient requested the test to be stopped. ?Heart rate demonstrated pharmacologically blunted response to exercise ?Overall, the patient's exercise capacity was normal. ?Duke Treadmill Score: low risk ? ? ?CMRI 05/03/17 ?1) Normal LV size and function EF 75% ?  ?2) Normal RV size and function no evidence of RV dysplasia ?  ?3) No delayed gadolinium enhancement in LV myocardium on inversion ?recovery sequences ?  ?4) Normal AV, MV and TV ?  ?5)  Prominent epicardial fat pad ? ?Cardiac monitor 08/28/2020 personally reviewed ?predomianant rhythm was sinus rhythm ?Zero atrial fibrillation noted ?<1% PVCs ?Symptoms of chest pain/pressure associated with sinus rhythm ? ? ?ASSESSMENT AND PLAN: ? ?1.  Ventricular tachycardia: Left heart catheterization and cardiac MRI without any evidence of coronary artery disease or scar.  Currently on propafenone 225 mg twice daily.  High risk medication monitoring with ECG.  Low burden on most recent monitor.  No changes. ? ?2.  Syncope: Had prior episodes where she falls asleep and is difficult to arouse.  Linq monitor has been offered but patient has refused.  Nothing on cardiac monitor.  No changes. ? ?3.  Falls: Patient is not losing consciousness.  She is falling when she is standing up for an extended period of time.  This is not occurring when she is walking and she has not tripped.  Unlikely to be cardiac in nature.  We Loucinda Croy continue with plan per primary care. ? ?4.  Left lower extremity swelling: We Saundra Gin plan for ultrasound to rule out thrombus ? ?Current medicines are reviewed at length with the patient today.   ?The patient does not have concerns regarding her medicines.  The following changes  were made today: None ? ?Labs/ tests ordered today include:  ?Orders Placed This Encounter  ?Procedures  ? EKG 12-Lead  ? VAS [place] LOWER EXTREMITY VENOUS (DVT)  ? ? ? ? ?Disposition:   FU with Anush Wiedeman 6 month

## 2022-03-17 ENCOUNTER — Other Ambulatory Visit: Payer: Self-pay

## 2022-03-17 ENCOUNTER — Ambulatory Visit (HOSPITAL_COMMUNITY)
Admission: RE | Admit: 2022-03-17 | Discharge: 2022-03-17 | Disposition: A | Discharge status: Home / Self Care | Payer: Medicare PPO | Source: Ambulatory Visit | Attending: Cardiovascular Disease | Admitting: Cardiovascular Disease

## 2022-03-17 DIAGNOSIS — R2242 Localized swelling, mass and lump, left lower limb: Secondary | ICD-10-CM | POA: Diagnosis not present

## 2022-03-23 DIAGNOSIS — R899 Unspecified abnormal finding in specimens from other organs, systems and tissues: Secondary | ICD-10-CM | POA: Diagnosis not present

## 2022-03-23 DIAGNOSIS — R946 Abnormal results of thyroid function studies: Secondary | ICD-10-CM | POA: Diagnosis not present

## 2022-03-23 DIAGNOSIS — R944 Abnormal results of kidney function studies: Secondary | ICD-10-CM | POA: Diagnosis not present

## 2022-03-25 DIAGNOSIS — E871 Hypo-osmolality and hyponatremia: Secondary | ICD-10-CM | POA: Diagnosis not present

## 2022-04-13 DIAGNOSIS — M545 Low back pain, unspecified: Secondary | ICD-10-CM | POA: Diagnosis not present

## 2022-04-20 DIAGNOSIS — M170 Bilateral primary osteoarthritis of knee: Secondary | ICD-10-CM | POA: Diagnosis not present

## 2022-05-01 DIAGNOSIS — R946 Abnormal results of thyroid function studies: Secondary | ICD-10-CM | POA: Diagnosis not present

## 2022-05-02 ENCOUNTER — Other Ambulatory Visit: Payer: Self-pay | Admitting: Cardiology

## 2022-05-20 DIAGNOSIS — M170 Bilateral primary osteoarthritis of knee: Secondary | ICD-10-CM | POA: Diagnosis not present

## 2022-05-20 DIAGNOSIS — M1711 Unilateral primary osteoarthritis, right knee: Secondary | ICD-10-CM | POA: Diagnosis not present

## 2022-05-20 DIAGNOSIS — M1712 Unilateral primary osteoarthritis, left knee: Secondary | ICD-10-CM | POA: Diagnosis not present

## 2022-06-09 DIAGNOSIS — M170 Bilateral primary osteoarthritis of knee: Secondary | ICD-10-CM | POA: Diagnosis not present

## 2022-06-10 DIAGNOSIS — R600 Localized edema: Secondary | ICD-10-CM | POA: Diagnosis not present

## 2022-06-15 ENCOUNTER — Encounter: Payer: Self-pay | Admitting: Cardiology

## 2022-06-17 NOTE — Telephone Encounter (Signed)
Pt just wanted to let us know what is going on and if we had any other advisement. Aware to keep follow up with PCP to determine cause of concern (BLEE). She is currently elevating legs. Informed to have PCP reach out to cardiology if they feel related. Patient verbalized understanding and agreeable to plan.

## 2022-06-29 DIAGNOSIS — R600 Localized edema: Secondary | ICD-10-CM | POA: Diagnosis not present

## 2022-06-29 DIAGNOSIS — E871 Hypo-osmolality and hyponatremia: Secondary | ICD-10-CM | POA: Diagnosis not present

## 2022-06-29 DIAGNOSIS — R899 Unspecified abnormal finding in specimens from other organs, systems and tissues: Secondary | ICD-10-CM | POA: Diagnosis not present

## 2022-07-06 DIAGNOSIS — N390 Urinary tract infection, site not specified: Secondary | ICD-10-CM | POA: Diagnosis not present

## 2022-07-08 DIAGNOSIS — F0390 Unspecified dementia without behavioral disturbance: Secondary | ICD-10-CM | POA: Diagnosis not present

## 2022-07-20 DIAGNOSIS — R351 Nocturia: Secondary | ICD-10-CM | POA: Diagnosis not present

## 2022-07-24 DIAGNOSIS — M170 Bilateral primary osteoarthritis of knee: Secondary | ICD-10-CM | POA: Diagnosis not present

## 2022-07-24 DIAGNOSIS — R52 Pain, unspecified: Secondary | ICD-10-CM | POA: Diagnosis not present

## 2022-07-27 ENCOUNTER — Telehealth: Payer: Self-pay | Admitting: *Deleted

## 2022-07-27 NOTE — Telephone Encounter (Signed)
   Pre-operative Risk Assessment    Patient Name: [name]  DOB: [birth year] MRN: [id number]      Request for Surgical Clearance    Procedure:   LEFT TOTAL KNEE REPLACEMENT   Date of Surgery:  Clearance TBD                                 Surgeon:  DR. Jaynee Eagles Surgeon's Group or Practice Name:  [facility] Phone number:  [phone] Fax number:  [phone] ATTN: CYNDY DUGGINS   Type of Clearance Requested:   - Medical ; NO MEDICATIONS ARE LISTED AS NEEDING TO BE HELD   Type of Anesthesia:  General    Additional requests/questions:  Please fax a copy of RECENT OFFICE NOTE, LABS, CHEST X-RAY AND EKG to the surgeon's office.  I WILL SEND A MESSAGE TO OUR MEDICAL RECORDS DEPT TO PLEASE FAX THESE REQUESTED ITEMS TO THE REQUESTING OFFICE FOR PRE OP  Signed, [name]   07/27/2022, 5:24 PM

## 2022-07-28 ENCOUNTER — Telehealth: Payer: Self-pay | Admitting: *Deleted

## 2022-07-28 NOTE — Telephone Encounter (Signed)
Pt agreeable to plan of care for tele pre op appt 08/04/22 @ 9:20. Med rec and consent are done.

## 2022-07-28 NOTE — Telephone Encounter (Signed)
Preoperative team, please contact this patient and set up a phone call appointment for further cardiac evaluation.  Thank you for your help.  Jossie Ng. Freeda Spivey NP-C    07/28/2022, 10:30 AM [place] [address] Office [phone] Fax [phone]

## 2022-07-28 NOTE — Telephone Encounter (Signed)
Pt agreeable to plan of care for tele pre op appt 08/04/22 @ 9:20. Med rec and consent are done.    Patient Consent for Virtual Visit        [name] has provided verbal consent on 07/28/2022 for a virtual visit (video or telephone).   CONSENT FOR VIRTUAL VISIT FOR:  [name]  By participating in this virtual visit I agree to the following:  I hereby voluntarily request, consent and authorize [facility] and its employed or contracted physicians, physician assistants, nurse practitioners or other licensed health care professionals (the Practitioner), to provide me with telemedicine health care services (the "Services") as deemed necessary by the treating Practitioner. I acknowledge and consent to receive the Services by the Practitioner via telemedicine. I understand that the telemedicine visit will involve communicating with the Practitioner through live audiovisual communication technology and the disclosure of certain medical information by electronic transmission. I acknowledge that I have been given the opportunity to request an in-person assessment or other available alternative prior to the telemedicine visit and am voluntarily participating in the telemedicine visit.  I understand that I have the right to withhold or withdraw my consent to the use of telemedicine in the course of my care at any time, without affecting my right to future care or treatment, and that the Practitioner or I may terminate the telemedicine visit at any time. I understand that I have the right to inspect all information obtained and/or recorded in the course of the telemedicine visit and may receive copies of available information for a reasonable fee.  I understand that some of the potential risks of receiving the Services via telemedicine include:  Delay or interruption in medical evaluation due to technological equipment failure or disruption; Information transmitted may not be sufficient (e.g. poor  resolution of images) to allow for appropriate medical decision making by the Practitioner; and/or  In rare instances, security protocols could fail, causing a breach of personal health information.  Furthermore, I acknowledge that it is my responsibility to provide information about my medical history, conditions and care that is complete and accurate to the best of my ability. I acknowledge that Practitioner's advice, recommendations, and/or decision may be based on factors not within their control, such as incomplete or inaccurate data provided by me or distortions of diagnostic images or specimens that may result from electronic transmissions. I understand that the practice of medicine is not an exact science and that Practitioner makes no warranties or guarantees regarding treatment outcomes. I acknowledge that a copy of this consent can be made available to me via my patient portal ([facility]), or I can request a printed copy by calling the office of [facility].    I understand that my insurance will be billed for this visit.   I have read or had this consent read to me. I understand the contents of this consent, which adequately explains the benefits and risks of the Services being provided via telemedicine.  I have been provided ample opportunity to ask questions regarding this consent and the Services and have had my questions answered to my satisfaction. I give my informed consent for the services to be provided through the use of telemedicine in my medical care

## 2022-08-03 NOTE — Progress Notes (Unsigned)
Virtual Visit via Telephone Note   Because of [name]'s co-morbid illnesses, she is at least at moderate risk for complications without adequate follow up.  This format is felt to be most appropriate for this patient at this time.  The patient did not have access to video technology/had technical difficulties with video requiring transitioning to audio format only (telephone).  All issues noted in this document were discussed and addressed.  No physical exam could be performed with this format.  Please refer to the patient's chart for her consent to telehealth for [name].  Evaluation Performed:  Preoperative cardiovascular risk assessment _____________   Date:  08/03/2022   Patient ID:  [name], DOB 07-24-1956, MRN [id number] Patient Location:  Home Provider location:   Office  Primary Care Provider:  Aretta Nip, MD Primary Cardiologist:  Will Meredith Leeds, MD  Chief Complaint / Patient Profile   66 y.o. y/o female with a h/o ventricular tachycardia s/p exercise treadmill test 04/30/2017 at which time during stage III she developed sustained VT at which time treadmill was stopped and she converted to SR, bigeminy PVCs, palpitations, no evidence of CAD or scar by LHC and MRI who is pending left total knee replacement and presents today for telephonic preoperative cardiovascular risk assessment.  Past Medical History    Past Medical History:  Diagnosis Date   Arthritis lower back/hands   Brain injury    Cancer [facility] breast tumor removed   Leg fracture    Seizure disorder ([place])    Seizures ([place])    VT (ventricular tachycardia) 04/29/2017   Past Surgical History:  Procedure Laterality Date   BREAST SURGERY     FACIAL FRACTURE SURGERY  orbital bone repair   LEFT HEART CATH AND CORONARY ANGIOGRAPHY N/A 04/30/2017   Procedure: Left Heart Cath and Coronary Angiography;  Surgeon: Leonie Man, MD;  Location: [facility];  Service: Cardiovascular;   Laterality: N/A;   TOTAL HIP ARTHROPLASTY Left 09/07/2019   Procedure: TOTAL HIP ARTHROPLASTY ANTERIOR APPROACH;  Surgeon: Rod Can, MD;  Location: [facility];  Service: Orthopedics;  Laterality: Left;    Allergies  No Known Allergies  History of Present Illness    [name] is a 66 y.o. female who presents via audio/video conferencing for a telehealth visit today.  Pt was last seen in cardiology clinic on 03/13/22 by Dr. Curt Bears.  At that time [name] was doing well.  The patient is now pending procedure as outlined above. Since her last visit, she denies chest pain, lower extremity edema, fatigue, palpitations, melena, hematuria, hemoptysis, diaphoresis, presyncope, syncope, orthopnea, and PND. Reports some shortness of breath at the top of their stairs that she feels has worsened due to knee pain.   Home Medications    Prior to Admission medications   Medication Sig Start Date End Date Taking? Authorizing Provider  acetaminophen (TYLENOL) 500 MG tablet Take 2 tablets by mouth as needed for pain.    [provider]  CALCIUM PO Take 1 capsule by mouth daily.    [provider]  cloBAZam (ONFI) 10 MG tablet Take 1 tablet (10 mg) in the mornings and one and a half tablets (15 mg) at bedtime by moth daily.    [provider]  divalproex (DEPAKOTE ER) 500 MG 24 hr tablet Take 1,000 mg by mouth at bedtime.     [provider]  Melatonin 10 MG CAPS Take 1 capsule by mouth  at bedtime.    [provider]  Multiple Vitamin (MULTIVITAMIN) capsule Take 1 capsule by mouth daily.    [provider]  Pantoprazole Sodium (PROTONIX PO) Take 1 capsule by mouth 2 (two) times daily.    [provider]  polyethylene glycol (MIRALAX / GLYCOLAX) packet Take 17 g by mouth daily.    [provider]  propafenone (RYTHMOL SR) 225 MG 12 hr capsule TAKE 1 CAPSULE BY MOUTH TWICE A DAY 05/04/22   Lelon Perla, MD  VITAMIN D PO Take 1  capsule by mouth daily.    [provider]    Physical Exam    Vital Signs:  [name] does not have vital signs available for review today.  Given telephonic nature of communication, physical exam is limited. AAOx3. NAD. Normal affect.  Speech and respirations are unlabored.  Accessory Clinical Findings    None  Assessment & Plan    1.  Preoperative Cardiovascular Risk Assessment: Due to her history of gait instability and knee pain, she is unable to achieve 4 METS activity. Discussed with Dr. Curt Bears who advised that she is at intermediate risk for an intermediate risk procedure. No further cardiac testing is necessary if patient is not having chest pain. Patient and her husband verify no chest pain, therefore clearance will be forwarded without further delay. No request for medication hold.  A copy of this note will be routed to requesting surgeon.  Time:   Today, I have spent 8 minutes with the patient with telehealth technology discussing medical history, symptoms, and management plan.     Emmaline Life, NP-C    08/03/2022, 12:34 PM [place] [address] Office [phone] Fax [phone]

## 2022-08-04 ENCOUNTER — Encounter: Payer: Self-pay | Admitting: Nurse Practitioner

## 2022-08-04 ENCOUNTER — Ambulatory Visit (INDEPENDENT_AMBULATORY_CARE_PROVIDER_SITE_OTHER): Payer: Medicare PPO | Admitting: Nurse Practitioner

## 2022-08-04 DIAGNOSIS — Z01810 Encounter for preprocedural cardiovascular examination: Secondary | ICD-10-CM | POA: Diagnosis not present

## 2022-08-04 NOTE — Telephone Encounter (Signed)
Dr. Curt Bears, wanted to get your input on this patient's request for surgical clearance. Due to gait instability and now need for knee replacement, she cannot achieve > 4 METS activity. She is working with PT and has no chest pain. She has intermittent SOB for several years, no orthopnea or PND. Would you recommend further testing prior to knee replacement?  Thanks, Sharyn Lull

## 2022-08-05 DIAGNOSIS — Z01818 Encounter for other preprocedural examination: Secondary | ICD-10-CM | POA: Diagnosis not present

## 2022-08-18 DIAGNOSIS — F0280 Dementia in other diseases classified elsewhere without behavioral disturbance: Secondary | ICD-10-CM | POA: Diagnosis not present

## 2022-08-18 DIAGNOSIS — Z79899 Other long term (current) drug therapy: Secondary | ICD-10-CM | POA: Diagnosis not present

## 2022-08-18 DIAGNOSIS — G3109 Other frontotemporal dementia: Secondary | ICD-10-CM | POA: Diagnosis not present

## 2022-08-18 DIAGNOSIS — G8918 Other acute postprocedural pain: Secondary | ICD-10-CM | POA: Diagnosis not present

## 2022-08-18 DIAGNOSIS — G40209 Localization-related (focal) (partial) symptomatic epilepsy and epileptic syndromes with complex partial seizures, not intractable, without status epilepticus: Secondary | ICD-10-CM | POA: Diagnosis not present

## 2022-08-18 DIAGNOSIS — Z87891 Personal history of nicotine dependence: Secondary | ICD-10-CM | POA: Diagnosis not present

## 2022-08-18 DIAGNOSIS — K219 Gastro-esophageal reflux disease without esophagitis: Secondary | ICD-10-CM | POA: Diagnosis not present

## 2022-08-18 DIAGNOSIS — M1712 Unilateral primary osteoarthritis, left knee: Secondary | ICD-10-CM | POA: Diagnosis not present

## 2022-08-18 DIAGNOSIS — I272 Pulmonary hypertension, unspecified: Secondary | ICD-10-CM | POA: Diagnosis not present

## 2022-08-19 DIAGNOSIS — G40209 Localization-related (focal) (partial) symptomatic epilepsy and epileptic syndromes with complex partial seizures, not intractable, without status epilepticus: Secondary | ICD-10-CM | POA: Diagnosis not present

## 2022-08-19 DIAGNOSIS — Z87891 Personal history of nicotine dependence: Secondary | ICD-10-CM | POA: Diagnosis not present

## 2022-08-19 DIAGNOSIS — G3109 Other frontotemporal dementia: Secondary | ICD-10-CM | POA: Diagnosis not present

## 2022-08-19 DIAGNOSIS — K219 Gastro-esophageal reflux disease without esophagitis: Secondary | ICD-10-CM | POA: Diagnosis not present

## 2022-08-19 DIAGNOSIS — Z79899 Other long term (current) drug therapy: Secondary | ICD-10-CM | POA: Diagnosis not present

## 2022-08-19 DIAGNOSIS — F0280 Dementia in other diseases classified elsewhere without behavioral disturbance: Secondary | ICD-10-CM | POA: Diagnosis not present

## 2022-08-19 DIAGNOSIS — M1712 Unilateral primary osteoarthritis, left knee: Secondary | ICD-10-CM | POA: Diagnosis not present

## 2022-08-20 ENCOUNTER — Other Ambulatory Visit: Payer: Self-pay

## 2022-08-20 ENCOUNTER — Inpatient Hospital Stay (HOSPITAL_COMMUNITY)
Admission: EM | Admit: 2022-08-20 | Discharge: 2022-08-24 | DRG: 682 | Disposition: A | Discharge status: Skilled Nursing Facility | Payer: Medicare PPO | Attending: Internal Medicine | Admitting: Internal Medicine

## 2022-08-20 ENCOUNTER — Observation Stay (HOSPITAL_COMMUNITY): Payer: Medicare PPO

## 2022-08-20 DIAGNOSIS — G3109 Other frontotemporal dementia: Secondary | ICD-10-CM | POA: Diagnosis present

## 2022-08-20 DIAGNOSIS — T424X5A Adverse effect of benzodiazepines, initial encounter: Secondary | ICD-10-CM | POA: Diagnosis present

## 2022-08-20 DIAGNOSIS — Z79899 Other long term (current) drug therapy: Secondary | ICD-10-CM | POA: Diagnosis not present

## 2022-08-20 DIAGNOSIS — Z8249 Family history of ischemic heart disease and other diseases of the circulatory system: Secondary | ICD-10-CM

## 2022-08-20 DIAGNOSIS — T402X5A Adverse effect of other opioids, initial encounter: Secondary | ICD-10-CM | POA: Diagnosis present

## 2022-08-20 DIAGNOSIS — M6281 Muscle weakness (generalized): Secondary | ICD-10-CM | POA: Diagnosis not present

## 2022-08-20 DIAGNOSIS — N179 Acute kidney failure, unspecified: Principal | ICD-10-CM | POA: Diagnosis present

## 2022-08-20 DIAGNOSIS — F0390 Unspecified dementia without behavioral disturbance: Secondary | ICD-10-CM | POA: Diagnosis not present

## 2022-08-20 DIAGNOSIS — E860 Dehydration: Secondary | ICD-10-CM | POA: Diagnosis present

## 2022-08-20 DIAGNOSIS — R0689 Other abnormalities of breathing: Secondary | ICD-10-CM | POA: Diagnosis not present

## 2022-08-20 DIAGNOSIS — I959 Hypotension, unspecified: Secondary | ICD-10-CM | POA: Diagnosis not present

## 2022-08-20 DIAGNOSIS — G9341 Metabolic encephalopathy: Secondary | ICD-10-CM | POA: Diagnosis not present

## 2022-08-20 DIAGNOSIS — R4182 Altered mental status, unspecified: Secondary | ICD-10-CM | POA: Diagnosis not present

## 2022-08-20 DIAGNOSIS — R531 Weakness: Secondary | ICD-10-CM | POA: Diagnosis not present

## 2022-08-20 DIAGNOSIS — M170 Bilateral primary osteoarthritis of knee: Secondary | ICD-10-CM | POA: Diagnosis not present

## 2022-08-20 DIAGNOSIS — F0280 Dementia in other diseases classified elsewhere without behavioral disturbance: Secondary | ICD-10-CM | POA: Diagnosis present

## 2022-08-20 DIAGNOSIS — T428X5A Adverse effect of antiparkinsonism drugs and other central muscle-tone depressants, initial encounter: Secondary | ICD-10-CM | POA: Diagnosis present

## 2022-08-20 DIAGNOSIS — Z96652 Presence of left artificial knee joint: Secondary | ICD-10-CM | POA: Diagnosis present

## 2022-08-20 DIAGNOSIS — T40601A Poisoning by unspecified narcotics, accidental (unintentional), initial encounter: Secondary | ICD-10-CM

## 2022-08-20 DIAGNOSIS — G928 Other toxic encephalopathy: Secondary | ICD-10-CM | POA: Diagnosis present

## 2022-08-20 DIAGNOSIS — I472 Ventricular tachycardia, unspecified: Secondary | ICD-10-CM | POA: Diagnosis present

## 2022-08-20 DIAGNOSIS — Z96659 Presence of unspecified artificial knee joint: Secondary | ICD-10-CM | POA: Diagnosis not present

## 2022-08-20 DIAGNOSIS — G40909 Epilepsy, unspecified, not intractable, without status epilepticus: Secondary | ICD-10-CM | POA: Diagnosis present

## 2022-08-20 DIAGNOSIS — M47816 Spondylosis without myelopathy or radiculopathy, lumbar region: Secondary | ICD-10-CM | POA: Diagnosis present

## 2022-08-20 DIAGNOSIS — Z800 Family history of malignant neoplasm of digestive organs: Secondary | ICD-10-CM

## 2022-08-20 DIAGNOSIS — R410 Disorientation, unspecified: Secondary | ICD-10-CM | POA: Diagnosis not present

## 2022-08-20 DIAGNOSIS — T402X1A Poisoning by other opioids, accidental (unintentional), initial encounter: Secondary | ICD-10-CM | POA: Diagnosis not present

## 2022-08-20 DIAGNOSIS — R569 Unspecified convulsions: Secondary | ICD-10-CM | POA: Diagnosis not present

## 2022-08-20 DIAGNOSIS — K219 Gastro-esophageal reflux disease without esophagitis: Secondary | ICD-10-CM | POA: Diagnosis not present

## 2022-08-20 DIAGNOSIS — Z7401 Bed confinement status: Secondary | ICD-10-CM | POA: Diagnosis not present

## 2022-08-20 DIAGNOSIS — Z471 Aftercare following joint replacement surgery: Secondary | ICD-10-CM | POA: Diagnosis not present

## 2022-08-20 DIAGNOSIS — J9811 Atelectasis: Secondary | ICD-10-CM | POA: Diagnosis not present

## 2022-08-20 LAB — CBC WITH DIFFERENTIAL/PLATELET
Abs Immature Granulocytes: 0.04 10*3/uL (ref 0.00–0.07)
Basophils Absolute: 0 10*3/uL (ref 0.0–0.1)
Basophils Relative: 0 %
Eosinophils Absolute: 0 10*3/uL (ref 0.0–0.5)
Eosinophils Relative: 1 %
HCT: 29.5 % — ABNORMAL LOW (ref 36.0–46.0)
Hemoglobin: 10.1 g/dL — ABNORMAL LOW (ref 12.0–15.0)
Immature Granulocytes: 1 %
Lymphocytes Relative: 17 %
Lymphs Abs: 1.3 10*3/uL (ref 0.7–4.0)
MCH: 33 pg (ref 26.0–34.0)
MCHC: 34.2 g/dL (ref 30.0–36.0)
MCV: 96.4 fL (ref 80.0–100.0)
Monocytes Absolute: 1.1 10*3/uL — ABNORMAL HIGH (ref 0.1–1.0)
Monocytes Relative: 15 %
Neutro Abs: 5 10*3/uL (ref 1.7–7.7)
Neutrophils Relative %: 66 %
Platelets: 134 10*3/uL — ABNORMAL LOW (ref 150–400)
RBC: 3.06 MIL/uL — ABNORMAL LOW (ref 3.87–5.11)
RDW: 13.2 % (ref 11.5–15.5)
WBC: 7.5 10*3/uL (ref 4.0–10.5)
nRBC: 0 % (ref 0.0–0.2)

## 2022-08-20 LAB — HEPATIC FUNCTION PANEL
ALT: 8 U/L (ref 0–44)
AST: 13 U/L — ABNORMAL LOW (ref 15–41)
Albumin: 2.3 g/dL — ABNORMAL LOW (ref 3.5–5.0)
Alkaline Phosphatase: 57 U/L (ref 38–126)
Bilirubin, Direct: <0.1 mg/dL (ref 0.0–0.2)
Total Bilirubin: 0.3 mg/dL (ref 0.3–1.2)
Total Protein: 4.9 g/dL — ABNORMAL LOW (ref 6.5–8.1)

## 2022-08-20 LAB — BASIC METABOLIC PANEL
Anion gap: 8 (ref 5–15)
BUN: 40 mg/dL — ABNORMAL HIGH (ref 8–23)
CO2: 26 mmol/L (ref 22–32)
Calcium: 8.4 mg/dL — ABNORMAL LOW (ref 8.9–10.3)
Chloride: 97 mmol/L — ABNORMAL LOW (ref 98–111)
Creatinine, Ser: 2.14 mg/dL — ABNORMAL HIGH (ref 0.44–1.00)
GFR, Estimated: 25 mL/min — ABNORMAL LOW (ref >60)
Glucose, Bld: 99 mg/dL (ref 70–99)
Potassium: 3.9 mmol/L (ref 3.5–5.1)
Sodium: 131 mmol/L — ABNORMAL LOW (ref 135–145)

## 2022-08-20 LAB — LACTIC ACID, PLASMA: Lactic Acid, Venous: 1.4 mmol/L (ref 0.5–1.9)

## 2022-08-20 MED ORDER — PROPAFENONE HCL ER 225 MG PO CP12
225.0000 mg | ORAL_CAPSULE | Freq: Two times a day (BID) | ORAL | Status: DC
  Administered 2022-08-20 – 2022-08-24 (×8): 225 mg via ORAL
  Filled 2022-08-20 (×9): qty 1

## 2022-08-20 MED ORDER — SODIUM CHLORIDE 0.9 % IV BOLUS
500.0000 mL | Freq: Once | INTRAVENOUS | Status: AC
  Administered 2022-08-20: 500 mL via INTRAVENOUS

## 2022-08-20 MED ORDER — ONDANSETRON HCL 4 MG/2ML IJ SOLN: 4.0000 mg | Freq: Four times a day (QID) | INTRAMUSCULAR | Status: DC | PRN

## 2022-08-20 MED ORDER — CLOBAZAM 10 MG PO TABS
15.0000 mg | ORAL_TABLET | Freq: Every day | ORAL | Status: DC
  Administered 2022-08-20: 15 mg via ORAL
  Filled 2022-08-20: qty 2

## 2022-08-20 MED ORDER — SODIUM CHLORIDE 0.9 % IV SOLN
INTRAVENOUS | Status: DC
Start: 2022-08-20 — End: 2022-08-21

## 2022-08-20 MED ORDER — ACETAMINOPHEN 500 MG PO TABS
1000.0000 mg | ORAL_TABLET | Freq: Four times a day (QID) | ORAL | Status: DC | PRN
  Administered 2022-08-21: 1000 mg via ORAL
  Filled 2022-08-20: qty 2

## 2022-08-20 MED ORDER — DIVALPROEX SODIUM ER 500 MG PO TB24
1000.0000 mg | ORAL_TABLET | Freq: Every day | ORAL | Status: DC
  Administered 2022-08-20 – 2022-08-23 (×4): 1000 mg via ORAL
  Filled 2022-08-20 (×5): qty 2

## 2022-08-20 MED ORDER — HEPARIN SODIUM (PORCINE) 5000 UNIT/ML IJ SOLN
5000.0000 [IU] | Freq: Two times a day (BID) | INTRAMUSCULAR | Status: DC
  Administered 2022-08-20 – 2022-08-24 (×8): 5000 [IU] via SUBCUTANEOUS
  Filled 2022-08-20 (×9): qty 1

## 2022-08-20 MED ORDER — HYDROMORPHONE HCL 1 MG/ML IJ SOLN: 0.5000 mg | INTRAMUSCULAR | Status: DC | PRN

## 2022-08-20 MED ORDER — MELATONIN 5 MG PO TABS
10.0000 mg | ORAL_TABLET | Freq: Every day | ORAL | Status: DC
Start: 2022-08-20 — End: 2022-08-24
  Administered 2022-08-20 – 2022-08-23 (×4): 10 mg via ORAL
  Filled 2022-08-20 (×4): qty 2

## 2022-08-20 MED ORDER — ONDANSETRON HCL 4 MG PO TABS: 4.0000 mg | ORAL_TABLET | Freq: Four times a day (QID) | ORAL | Status: DC | PRN

## 2022-08-20 MED ORDER — BISACODYL 5 MG PO TBEC: 5.0000 mg | DELAYED_RELEASE_TABLET | Freq: Every day | ORAL | Status: DC | PRN

## 2022-08-20 MED ORDER — PANTOPRAZOLE SODIUM 40 MG PO TBEC
40.0000 mg | DELAYED_RELEASE_TABLET | Freq: Every day | ORAL | Status: DC
  Administered 2022-08-21 – 2022-08-24 (×4): 40 mg via ORAL
  Filled 2022-08-20 (×4): qty 1

## 2022-08-20 MED ORDER — DOXYCYCLINE HYCLATE 100 MG PO TABS
100.0000 mg | ORAL_TABLET | Freq: Two times a day (BID) | ORAL | Status: DC
Start: 2022-08-20 — End: 2022-08-24
  Administered 2022-08-20 – 2022-08-24 (×9): 100 mg via ORAL
  Filled 2022-08-20 (×9): qty 1

## 2022-08-20 MED ORDER — CLOBAZAM 10 MG PO TABS
10.0000 mg | ORAL_TABLET | Freq: Every day | ORAL | Status: DC
Start: 2022-08-21 — End: 2022-08-21

## 2022-08-20 MED ORDER — POLYETHYLENE GLYCOL 3350 17 G PO PACK
17.0000 g | PACK | Freq: Every day | ORAL | Status: DC
  Administered 2022-08-20: 17 g via ORAL
  Filled 2022-08-20: qty 1

## 2022-08-20 MED ORDER — SENNOSIDES-DOCUSATE SODIUM 8.6-50 MG PO TABS: 1.0000 | ORAL_TABLET | Freq: Every evening | ORAL | Status: DC | PRN

## 2022-08-20 NOTE — H&P (Signed)
History and Physical    [name] [name] DOB: 19-Sep-1956 DOA: 08/20/2022  PCP: Aretta Nip, MD (Confirm with patient/family/NH records and if not entered, this has to be entered at [facility] point of entry) Patient coming from: Home  I have personally briefly reviewed patient's old medical records in [place]  Chief Complaint: Feeling drowsy  HPI: [name] is a 66 y.o. female with medical history significant of note dementia, paroxysmal V. tach on propafenone, seizure disorder, left knee arthritis status post knee replacement on Tuesday, presented with drowsiness.  Patient still feeling drowsy, most history provided by husband at bedside.  Patient underwent left knee arthroplasty Tuesday, discharged home with 3 medications including antibiotics, oxycodone every 6 hours and antispasm medications.  Patient has been taking narcotic every 6 hours since Tuesday night and family noticed patient started to have drowsiness daytime since yesterday, probably had poor oral intake and dehydrated as well.  This morning, patient took 1 oxycodone at breakfast, then became somnolent and hard to arouse.  EMS called and 1 dose of Narcan given patient woke up.  ED Course: Blood pressure on the lower side, no tachycardia.  More awake.  Blood work showed AKI creatinine> 2 compared to baseline 0.8 about 3 weeks ago.  Review of Systems: Unable to perform, patient still drowsy and somewhat confused  Past Medical History:  Diagnosis Date   Arthritis lower back/hands   Brain injury ([place])    Cancer ([place]) breast tumor removed   Leg fracture    Seizure disorder ([place])    Seizures ([place])    VT (ventricular tachycardia) ([place]) 04/29/2017    Past Surgical History:  Procedure Laterality Date   BREAST SURGERY     FACIAL FRACTURE SURGERY  orbital bone repair   LEFT HEART CATH AND CORONARY ANGIOGRAPHY N/A 04/30/2017   Procedure: Left Heart Cath and Coronary Angiography;  Surgeon: Leonie Man, MD;  Location: [facility];  Service: Cardiovascular;  Laterality: N/A;   TOTAL HIP ARTHROPLASTY Left 09/07/2019   Procedure: TOTAL HIP ARTHROPLASTY ANTERIOR APPROACH;  Surgeon: Rod Can, MD;  [facility]: [facility];  Service: Orthopedics;  Laterality: Left;     reports that she has never smoked. She has never used smokeless tobacco. She reports that she does not drink alcohol and does not use drugs.  No Known Allergies  Family History  Problem Relation Age of Onset   Heart failure Mother    Heart disease Mother    Pancreatic cancer Father      Prior to Admission medications   Medication Sig Start Date End Date Taking? Authorizing Provider  acetaminophen (TYLENOL) 500 MG tablet Take 2 tablets by mouth as needed for pain.    [provider]  CALCIUM PO Take 1 capsule by mouth daily.    [provider]  cloBAZam (ONFI) 10 MG tablet Take 1 tablet (10 mg) in the mornings and one and a half tablets (15 mg) at bedtime by moth daily.    [provider]  divalproex (DEPAKOTE ER) 500 MG 24 hr tablet Take 1,000 mg by mouth at bedtime.     [provider]  Melatonin 10 MG CAPS Take 1 capsule by mouth at bedtime.    [provider]  Multiple Vitamin (MULTIVITAMIN) capsule Take 1 capsule by mouth daily.    [provider]  Pantoprazole Sodium (PROTONIX PO) Take 1 capsule by mouth 2 (two) times daily.    [provider]  polyethylene glycol (MIRALAX / GLYCOLAX) packet Take 17 g by mouth daily.    [provider]  propafenone (RYTHMOL SR) 225 MG 12 hr capsule TAKE 1 CAPSULE BY MOUTH TWICE A DAY 05/04/22   Lelon Perla, MD  VITAMIN D PO Take 1 capsule by mouth daily.    [provider]    Physical Exam: Vitals:   08/20/22 1100 08/20/22 1310  BP: (!) 103/43 (!) 105/50  Pulse: 70 78  Resp: 15 18  Temp: 97.6 F (36.4 C)   TempSrc: Oral   SpO2: 95% 95%    Constitutional: NAD, calm,  comfortable Vitals:   08/20/22 1100 08/20/22 1310  BP: (!) 103/43 (!) 105/50  Pulse: 70 78  Resp: 15 18  Temp: 97.6 F (36.4 C)   TempSrc: Oral   SpO2: 95% 95%   Eyes: PERRL, lids and conjunctivae normal ENMT: Mucous membranes are dry. Posterior pharynx clear of any exudate or lesions.Normal dentition.  Neck: normal, supple, no masses, no thyromegaly Respiratory: clear to auscultation bilaterally, no wheezing, no crackles. Normal respiratory effort. No accessory muscle use.  Cardiovascular: Regular rate and rhythm, no murmurs / rubs / gallops. No extremity edema. 2+ pedal pulses. No carotid bruits.  Abdomen: no tenderness, no masses palpated. No hepatosplenomegaly. Bowel sounds positive.  Musculoskeletal: no clubbing / cyanosis. No joint deformity upper and lower extremities. Good ROM, no contractures. Normal muscle tone.  Skin: no rashes, lesions, ulcers. No induration.  Left knee surgical site covered with dressing, with dried blood, no swelling, mild tenderness Neurologic: CN 2-12 grossly intact. Sensation intact, DTR normal. Strength 5/5 in all 4.  Psychiatric: Normal judgment and insight. Alert and oriented x 3. Normal mood.     Labs on Admission: I have personally reviewed following labs and imaging studies  CBC: Recent Labs  Lab 08/20/22 1150  WBC 7.5  NEUTROABS 5.0  HGB 10.1*  HCT 29.5*  MCV 96.4  PLT 751*   Basic Metabolic Panel: Recent Labs  Lab 08/20/22 1150  NA 131*  K 3.9  CL 97*  CO2 26  GLUCOSE 99  BUN 40*  CREATININE 2.14*  CALCIUM 8.4*   GFR: CrCl cannot be calculated (Unknown ideal weight.). Liver Function Tests: No results for input(s): "AST", "ALT", "ALKPHOS", "BILITOT", "PROT", "ALBUMIN" in the last 168 hours. No results for input(s): "LIPASE", "AMYLASE" in the last 168 hours. No results for input(s): "AMMONIA" in the last 168 hours. Coagulation Profile: No results for input(s): "INR", "PROTIME" in the last 168 hours. Cardiac Enzymes: No  results for input(s): "CKTOTAL", "CKMB", "CKMBINDEX", "TROPONINI" in the last 168 hours. BNP (last 3 results) No results for input(s): "PROBNP" in the last 8760 hours. HbA1C: No results for input(s): "HGBA1C" in the last 72 hours. CBG: No results for input(s): "GLUCAP" in the last 168 hours. Lipid Profile: No results for input(s): "CHOL", "HDL", "LDLCALC", "TRIG", "CHOLHDL", "LDLDIRECT" in the last 72 hours. Thyroid Function Tests: No results for input(s): "TSH", "T4TOTAL", "FREET4", "T3FREE", "THYROIDAB" in the last 72 hours. Anemia Panel: No results for input(s): "VITAMINB12", "FOLATE", "FERRITIN", "TIBC", "IRON", "RETICCTPCT" in the last 72 hours. Urine analysis:    Component Value Date/Time   COLORURINE STRAW (A) 09/08/2019 1700   APPEARANCEUR CLEAR 09/08/2019 1700   LABSPEC 1.009 09/08/2019 1700   PHURINE 5.0 09/08/2019 1700   GLUCOSEU 50 (A) 09/08/2019 1700   HGBUR NEGATIVE 09/08/2019 1700   BILIRUBINUR NEGATIVE 09/08/2019 1700   KETONESUR NEGATIVE 09/08/2019 1700   PROTEINUR NEGATIVE 09/08/2019 1700  UROBILINOGEN 0.2 08/26/2014 1546   NITRITE NEGATIVE 09/08/2019 [place] 09/08/2019 1700    Radiological Exams on Admission: No results found.  EKG: Independently reviewed.  Sinus rhythm, no no acute ST changes.  Assessment/Plan Principal Problem:   AKI (acute kidney injury) ([place])  (please populate well all problems here in Problem List. (For example, if patient is on BP meds at home and you resume or decide to hold them, it is a problem that needs to be her. Same for CAD, COPD, HLD and so on)  Acute toxic encephalopathy -Secondary to overdose of narcotics, symptoms was mild and improving after Narcan.  Clinically, likely narcotic buildup secondary to AKI, as family reported a cream to pill count as per husband, there is no intentional overdosing here. -Mentation much improved compared this morning. -Discontinue oxycodone, switch to Dilaudid, monitor  neuro status.  AKI -Clinically, have symptoms signs of volume contraction and dehydration -Maintenance IV fluid normal saline 125 an hour per hour x1 day, recheck kidney function tomorrow  History of VT -Normal EKG, continue propafenone  Seizure disorder -Symptoms and signs not compatible with seizure.  Continue Depakote and clobazam  Recent knee surgery -As per orthopedic surgeon's recommendation, continue doxycycline twice daily for total 7 days -DVT prophylaxis  Mild frontal temporal dementia -According to husband, mentation has been at baseline before knee surgery.  DVT prophylaxis: Heparin subcu Code Status: Full code Family Communication: Husband at bedside Disposition Plan: Expect less than 2 midnight hospital stay Consults called: None Admission status: MedSurg observation   Lequita Halt MD [facility] Pager [phone]  08/20/2022, 2:37 PM

## 2022-08-20 NOTE — ED Triage Notes (Signed)
Pt bib [facility] from home for accidental OD on oxycodone. Pt received prescribed dose of tylenol and oxycodone. First does last night, and another one this morning around 0930. Pt had breakfast and afterwards husband noticed she became lethargic, pinpoint pupils and breathing was shallow.Pt recently had left knee surgery on Tuesday. Pt has hx of frontal lobe dementia, according to her husband she is at baseline. Pt received 0.'5mg'$  narcan.  EMS vitals 94% O2 110/56 BP 72 HR 131 CBG

## 2022-08-20 NOTE — ED Provider Notes (Signed)
[facility] EMERGENCY DEPARTMENT Provider Note   CSN: [id number] Arrival date & time: 08/20/22  1046     History  Chief Complaint  Patient presents with   Drug Overdose    [name] is a 66 y.o. female.  Patient is a 66 year old female who presents with a possible opioid overdose.  She recently had a total knee replacement done on August 22.  She is at home with her husband.  She has been given a prescription for oxycodone to use for pain.  She took 1 last night and per her husband did okay with that.  She took 1 this morning around 930 and about 45 minutes to an hour after taking it, became somnolent.  Husband says she was sitting in a chair and became less responsive.  She was able to be aroused but was sleepy.  EMS was called.  She was given naloxone and woke up just a couple minutes after the naloxone.  Per husband she has been at her baseline mental status since that time.  She had pinpoint pupils per EMS report.  History is obtained from EMS and the patient's husband as patient does have a history of dementia.  There is been no other reported recent illnesses.  No reports of chest pain or shortness of breath.  No fevers.  The husband states that patient was acting normally this morning prior to this event.  She was taking oxycodone 5 mg tablets.  She is not taking any other pain medications other than Tylenol.       Home Medications Prior to Admission medications   Medication Sig Start Date End Date Taking? Authorizing Provider  acetaminophen (TYLENOL) 500 MG tablet Take 2 tablets by mouth as needed for pain.    [provider]  CALCIUM PO Take 1 capsule by mouth daily.    [provider]  cloBAZam (ONFI) 10 MG tablet Take 1 tablet (10 mg) in the mornings and one and a half tablets (15 mg) at bedtime by moth daily.    [provider]  divalproex (DEPAKOTE ER) 500 MG 24 hr tablet Take 1,000 mg by mouth at bedtime.     [provider]  Melatonin 10 MG CAPS Take 1 capsule by mouth at bedtime.    [provider]  Multiple Vitamin (MULTIVITAMIN) capsule Take 1 capsule by mouth daily.    [provider]  Pantoprazole Sodium (PROTONIX PO) Take 1 capsule by mouth 2 (two) times daily.    [provider]  polyethylene glycol (MIRALAX / GLYCOLAX) packet Take 17 g by mouth daily.    [provider]  propafenone (RYTHMOL SR) 225 MG 12 hr capsule TAKE 1 CAPSULE BY MOUTH TWICE A DAY 05/04/22   Lelon Perla, MD  VITAMIN D PO Take 1 capsule by mouth daily.    [provider]      Allergies    Patient has no known allergies.    Review of Systems   Review of Systems  Unable to perform ROS: Dementia    Physical Exam Updated Vital Signs BP (!) 105/50   Pulse 78   Temp 97.6 F (36.4 C) (Oral)   Resp 18   SpO2 95%  Physical Exam Constitutional:      Appearance: She is well-developed.  HENT:     Head: Normocephalic and atraumatic.  Eyes:     Pupils: Pupils are equal, round, and reactive to light.  Cardiovascular:  Rate and Rhythm: Normal rate and regular rhythm.     Heart sounds: Normal heart sounds.  Pulmonary:     Effort: Pulmonary effort is normal. No respiratory distress.     Breath sounds: Normal breath sounds. No wheezing or rales.  Chest:     Chest wall: No tenderness.  Abdominal:     General: Bowel sounds are normal.     Palpations: Abdomen is soft.     Tenderness: There is no abdominal tenderness. There is no guarding or rebound.  Musculoskeletal:        General: Normal range of motion.     Cervical back: Normal range of motion and neck supple.     Comments: Patient has a clean dressing located over the left knee.  The dressings intact.  There is a small amount of blood to the lower aspect of the dressing.  There is some mild swelling to the knee.  No significant swelling to the lower legs.  Lymphadenopathy:     Cervical: No cervical adenopathy.   Skin:    General: Skin is warm and dry.     Findings: No rash.  Neurological:     General: No focal deficit present.     Mental Status: She is alert.     Comments: Oriented to person and place.  Confused to date which is typical per patient's husband.     ED Results / Procedures / Treatments   Labs (all labs ordered are listed, but only abnormal results are displayed) Labs Reviewed  BASIC METABOLIC PANEL - Abnormal; Notable for the following components:      Result Value   Sodium 131 (*)    Chloride 97 (*)    BUN 40 (*)    Creatinine, Ser 2.14 (*)    Calcium 8.4 (*)    GFR, Estimated 25 (*)    All other components within normal limits  CBC WITH DIFFERENTIAL/PLATELET - Abnormal; Notable for the following components:   RBC 3.06 (*)    Hemoglobin 10.1 (*)    HCT 29.5 (*)    Platelets 134 (*)    Monocytes Absolute 1.1 (*)    All other components within normal limits  HEPATIC FUNCTION PANEL    EKG None  Radiology No results found.  Procedures Procedures    Medications Ordered in ED Medications  sodium chloride 0.9 % bolus 500 mL (has no administration in time range)    ED Course/ Medical Decision Making/ A&P                           Medical Decision Making Amount and/or Complexity of Data Reviewed Labs: ordered.  Risk Decision regarding hospitalization.   Patient is a 66 year old female who presents after taking a dose of oxycodone and becoming somnolent.  She responded to naloxone treatment.  She is back to her baseline mental status now.  Labs were performed which shows evidence of an AKI.  I reviewed her chart and her last creatinine was 0.77 on August 9.  Today its over 2.  She was started on IV fluids.  I consulted with Dr. Roosevelt Locks who will admit the patient for further treatment.  Final Clinical Impression(s) / ED Diagnoses Final diagnoses:  AKI (acute kidney injury) ([place])  Opiate overdose, accidental or unintentional, initial encounter [facility])    Rx  / [place] Orders ED Discharge Orders     None         Malvin Johns,  MD 08/20/22 1409

## 2022-08-21 DIAGNOSIS — G9341 Metabolic encephalopathy: Secondary | ICD-10-CM

## 2022-08-21 DIAGNOSIS — G40909 Epilepsy, unspecified, not intractable, without status epilepticus: Secondary | ICD-10-CM

## 2022-08-21 DIAGNOSIS — N179 Acute kidney failure, unspecified: Secondary | ICD-10-CM | POA: Diagnosis not present

## 2022-08-21 DIAGNOSIS — T40601A Poisoning by unspecified narcotics, accidental (unintentional), initial encounter: Secondary | ICD-10-CM | POA: Diagnosis not present

## 2022-08-21 LAB — BASIC METABOLIC PANEL
Anion gap: 8 (ref 5–15)
BUN: 42 mg/dL — ABNORMAL HIGH (ref 8–23)
CO2: 23 mmol/L (ref 22–32)
Calcium: 8.2 mg/dL — ABNORMAL LOW (ref 8.9–10.3)
Chloride: 102 mmol/L (ref 98–111)
Creatinine, Ser: 1.51 mg/dL — ABNORMAL HIGH (ref 0.44–1.00)
GFR, Estimated: 38 mL/min — ABNORMAL LOW (ref >60)
Glucose, Bld: 91 mg/dL (ref 70–99)
Potassium: 4.1 mmol/L (ref 3.5–5.1)
Sodium: 133 mmol/L — ABNORMAL LOW (ref 135–145)

## 2022-08-21 LAB — MAGNESIUM: Magnesium: 1.6 mg/dL — ABNORMAL LOW (ref 1.7–2.4)

## 2022-08-21 LAB — HIV ANTIBODY (ROUTINE TESTING W REFLEX): HIV Screen 4th Generation wRfx: NONREACTIVE

## 2022-08-21 MED ORDER — MAGNESIUM OXIDE -MG SUPPLEMENT 400 (240 MG) MG PO TABS
400.0000 mg | ORAL_TABLET | Freq: Two times a day (BID) | ORAL | Status: AC
  Administered 2022-08-21 (×2): 400 mg via ORAL
  Filled 2022-08-21 (×2): qty 1

## 2022-08-21 MED ORDER — HYDROCODONE-ACETAMINOPHEN 5-325 MG PO TABS
1.0000 | ORAL_TABLET | Freq: Four times a day (QID) | ORAL | Status: DC | PRN
  Administered 2022-08-23 – 2022-08-24 (×3): 1 via ORAL
  Filled 2022-08-21 (×3): qty 1

## 2022-08-21 MED ORDER — SENNOSIDES-DOCUSATE SODIUM 8.6-50 MG PO TABS
1.0000 | ORAL_TABLET | Freq: Two times a day (BID) | ORAL | Status: AC
Start: 2022-08-21 — End: 2022-08-21
  Administered 2022-08-21 (×2): 1 via ORAL
  Filled 2022-08-21 (×2): qty 1

## 2022-08-21 MED ORDER — POLYETHYLENE GLYCOL 3350 17 G PO PACK
17.0000 g | PACK | Freq: Two times a day (BID) | ORAL | Status: AC
Start: 2022-08-21 — End: 2022-08-22
  Administered 2022-08-21 – 2022-08-22 (×4): 17 g via ORAL
  Filled 2022-08-21 (×4): qty 1

## 2022-08-21 MED ORDER — SODIUM CHLORIDE 0.9 % IV SOLN: INTRAVENOUS | Status: AC

## 2022-08-21 MED ORDER — CLOBAZAM 10 MG PO TABS
15.0000 mg | ORAL_TABLET | Freq: Every day | ORAL | Status: DC
  Administered 2022-08-22 – 2022-08-23 (×2): 15 mg via ORAL
  Filled 2022-08-21 (×2): qty 2

## 2022-08-21 MED ORDER — CLOBAZAM 10 MG PO TABS
10.0000 mg | ORAL_TABLET | Freq: Every day | ORAL | Status: DC
  Administered 2022-08-22 – 2022-08-23 (×2): 10 mg via ORAL
  Filled 2022-08-21 (×2): qty 1

## 2022-08-21 NOTE — Progress Notes (Signed)
[facility] PROGRESS NOTE    Progress Note  [name]  UKG:[id number] DOB: 04-15-1956 DOA: 08/20/2022 PCP: Aretta Nip, MD     Brief Narrative:   [name] is an 66 y.o. female past medical history significant for dementia, paroxysmal V. tach on propafenone, seizure disorder left knee arthritis status post knee replacement on Tuesday comes in for with drowsiness, she was discharged on antibiotics oxycodone every 6 hours and antispasmodic since then the family has noted that the patient has been more drowsy.    Assessment/Plan:   Acute metabolic encephalopathy: Likely due to polypharmacy in the setting of acute kidney injury. Hold oxycodone, Robaxin, benzos, discontinue Dilaudid, as this may as these may worsen her somnolence. Continue IV fluid hydration. Husband relates that her mentation is improved but not back to baseline.  AKI (acute kidney injury) ([place]) With a baseline creatinine of less than 1, on admission 2.1 likely prerenal azotemia in the setting of decreased oral intake. She was started on IV fluid hydration and creatinine is improving nicely continue IV fluids at a lower rate for an additional 24 hours. Allow oral hydration.  History of VT (ventricular tachycardia) ([facility]) Continue propafenone  Seizure disorder ([place]) None continue current medication.  Patient with knee surgery: Continue Lovenox for DVT prophylaxis. Continue doxycycline twice a day.  Mild frontotemporal dementia: Mentation seems to be at baseline.  DVT prophylaxis: lovenox Family Communication:husband Status is: Observation The patient remains OBS appropriate and will d/c before 2 midnights.    Code Status:     Code Status Orders  (From admission, onward)           Start     Ordered   08/20/22 1423  Full code  Continuous        08/20/22 1423           Code Status History     Date Active Date Inactive Code Status Order ID Comments User Context    09/06/2019 1951 09/10/2019 1935 Full Code [id number]  Nita Sells, MD Inpatient   04/29/2017 6160 05/03/2017 2014 Full Code [id number]  Charlie Pitter, PA-C Inpatient      Advance Directive Documentation    Flowsheet Row Most Recent Value  Type of Advance Directive Healthcare Power of Gisela, Living will  Pre-existing out of facility DNR order (yellow form or pink MOST form) --  "MOST" Form in Place? --         IV Access:   Peripheral IV   Procedures and diagnostic studies:   DG Chest 1 View  Result Date: 08/20/2022 CLINICAL DATA:  Altered mental status. EXAM: CHEST  1 VIEW COMPARISON:  Chest radiograph 09/06/2019 FINDINGS: The cardiomediastinal silhouette is within normal limits allowing for AP technique and low lung volumes. Mild opacity is present in the left greater than right lung bases, likely atelectasis. No edema, sizable pleural effusion, or pneumothorax is identified. No acute osseous abnormality is seen. IMPRESSION: Low lung volumes with bibasilar atelectasis. Electronically Signed   By: Logan Bores M.D.   On: 08/20/2022 15:09     Medical Consultants:   None.   Subjective:    [name] has no new complaints except for her knee.  Objective:    Vitals:   08/20/22 1745 08/20/22 1932 08/21/22 0000 08/21/22 0400  BP:  (!) 105/49 (!) 109/51 (!) 111/53  Pulse:  88 80 81  Resp:  '18 15 16  '$ Temp:  97.7 F (36.5 C) 97.8 F (36.6 C) 98.1  F (36.7 C)  TempSrc:  Oral Oral Oral  SpO2:  96% 97% 98%  Weight: 68 kg     Height: '5\' 4"'$  (1.626 m)      SpO2: 98 %   Intake/Output Summary (Last 24 hours) at 08/21/2022 0738 Last data filed at 08/21/2022 0332 Gross per 24 hour  Intake 1791.71 ml  Output 600 ml  Net 1191.71 ml   Filed Weights   08/20/22 1745  Weight: 68 kg    Exam: General exam: In no acute distress. Respiratory system: Good air movement and clear to auscultation. Cardiovascular system: S1 & S2 heard, RRR. No JVD. Gastrointestinal system:  Abdomen is nondistended, soft and nontender.  Extremities: No pedal edema. Skin: No rashes, lesions or ulcers Psychiatry: Judgement and insight appear normal. Mood & affect appropriate.    Data Reviewed:    Labs: Basic Metabolic Panel: Recent Labs  Lab 08/20/22 1150 08/21/22 0317  NA 131* 133*  K 3.9 4.1  CL 97* 102  CO2 26 23  GLUCOSE 99 91  BUN 40* 42*  CREATININE 2.14* 1.51*  CALCIUM 8.4* 8.2*   GFR Estimated Creatinine Clearance: 34.7 mL/min (A) (by C-G formula based on SCr of 1.51 mg/dL (H)). Liver Function Tests: Recent Labs  Lab 08/20/22 1804  AST 13*  ALT 8  ALKPHOS 57  BILITOT 0.3  PROT 4.9*  ALBUMIN 2.3*   No results for input(s): "LIPASE", "AMYLASE" in the last 168 hours. No results for input(s): "AMMONIA" in the last 168 hours. Coagulation profile No results for input(s): "INR", "PROTIME" in the last 168 hours. COVID-19 Labs  No results for input(s): "DDIMER", "FERRITIN", "LDH", "CRP" in the last 72 hours.  Lab Results  Component Value Date   SARSCOV2NAA NEGATIVE 09/06/2019    CBC: Recent Labs  Lab 08/20/22 1150  WBC 7.5  NEUTROABS 5.0  HGB 10.1*  HCT 29.5*  MCV 96.4  PLT 134*   Cardiac Enzymes: No results for input(s): "CKTOTAL", "CKMB", "CKMBINDEX", "TROPONINI" in the last 168 hours. BNP (last 3 results) No results for input(s): "PROBNP" in the last 8760 hours. CBG: No results for input(s): "GLUCAP" in the last 168 hours. D-Dimer: No results for input(s): "DDIMER" in the last 72 hours. Hgb A1c: No results for input(s): "HGBA1C" in the last 72 hours. Lipid Profile: No results for input(s): "CHOL", "HDL", "LDLCALC", "TRIG", "CHOLHDL", "LDLDIRECT" in the last 72 hours. Thyroid function studies: No results for input(s): "TSH", "T4TOTAL", "T3FREE", "THYROIDAB" in the last 72 hours.  Invalid input(s): "FREET3" Anemia work up: No results for input(s): "VITAMINB12", "FOLATE", "FERRITIN", "TIBC", "IRON", "RETICCTPCT" in the last 72  hours. Sepsis Labs: Recent Labs  Lab 08/20/22 1150 08/20/22 1753  WBC 7.5  --   LATICACIDVEN  --  1.4   Microbiology No results found for this or any previous visit (from the past 240 hour(s)).   Medications:    cloBAZam  10 mg Oral Daily   cloBAZam  15 mg Oral QHS   divalproex  1,000 mg Oral QHS   doxycycline  100 mg Oral Q12H   heparin  5,000 Units Subcutaneous Q12H   melatonin  10 mg Oral QHS   pantoprazole  40 mg Oral Daily   polyethylene glycol  17 g Oral Daily   propafenone  225 mg Oral BID   Continuous Infusions:  sodium chloride 125 mL/hr at 08/21/22 0332      LOS: 0 days   Charlynne Cousins  [facility]  08/21/2022, 7:38 AM

## 2022-08-21 NOTE — Evaluation (Signed)
Physical Therapy Evaluation Patient Details Name: [name] MRN: [id number] DOB: [birth year] Today's Date: 08/21/2022  History of Present Illness  Pt is a 66 y/o F presenting with drowsiness s/p L TKA on 8/22. PMH includes dementia, paroxysmal V. tach on propafenone, seizure disorder left knee arthritis.   Clinical Impression  Received pt semi-reclined in bed and agreeable to PT evaluation. Pt able to provide subjective history but did demonstrate confusion (baseline dementia) with sequencing with mobility and overall awareness. Pt required max A to transfer to EOB with assist for LLE management. Pt unable to stand from regular height bed, but was able to stand x 2 reps from extremely elevated EOB with RW and max A. Pt unable to remain standing >10 seconds and ultimately unable to transfer. Returned to supine with total A and scooted to [facility] with max A with pt assisting pulling on headboard. Recommend SNF due to current strength/balance deficits. Acute PT to cont to follow.      Recommendations for follow up therapy are one component of a multi-disciplinary discharge planning process, led by the attending physician.  Recommendations may be updated based on patient status, additional functional criteria and insurance authorization.  Follow Up Recommendations Skilled nursing-short term rehab (<3 hours/day) Can patient physically be transported by private vehicle: No    Assistance Recommended at Discharge Frequent or constant Supervision/Assistance  Patient can return home with the following  Two people to help with walking and/or transfers;A lot of help with bathing/dressing/bathroom;Assistance with cooking/housework;Direct supervision/assist for medications management;Direct supervision/assist for financial management;Assist for transportation;Help with stairs or ramp for entrance    Equipment Recommendations Other (comment) (TBD - pt reports having RW)  Recommendations for Other Services        Functional Status Assessment Patient has had a recent decline in their functional status and demonstrates the ability to make significant improvements in function in a reasonable and predictable amount of time.     Precautions / Restrictions Precautions Precautions: Fall Precaution Comments: L TKA on 8/22 Restrictions Weight Bearing Restrictions: No      Mobility  Bed Mobility Overal bed mobility: Needs Assistance Bed Mobility: Rolling, Supine to Sit, Sit to Supine Rolling: Mod assist   Supine to sit: Max assist Sit to supine: Total assist   General bed mobility comments: pt required mod A to advance LLE to EOB with max cues for sequencing. Pt required max A for trunk control and to scoot hips to EOB. Pt with posterior lean upon sitting upright Patient Response: Cooperative, Anxious  Transfers Overall transfer level: Needs assistance Equipment used: Rolling walker (2 wheels) Transfers: Sit to/from Stand Sit to Stand: Max assist           General transfer comment: pt unable to stand from regular height bed but was able to stand x 2 reps from extremly elevated EOB with RW and max A - required mod cues for hand placement and for upright posture/gaze    Ambulation/Gait               General Gait Details: unable  Stairs            Wheelchair Mobility    Modified Rankin (Stroke Patients Only)       Balance Overall balance assessment: Needs assistance Sitting-balance support: Bilateral upper extremity supported, Feet supported Sitting balance-Leahy Scale: Poor Sitting balance - Comments: pt required heavy min A fading to close supervision for static sitting balance Postural control: Posterior lean Standing balance support: Bilateral upper extremity  supported, During functional activity, Reliant on assistive device for balance (RW) Standing balance-Leahy Scale: Poor Standing balance comment: Pt required min A for static standing balance and only able  to remain standing ~10 seconds prior to sitting back down                             Pertinent Vitals/Pain Pain Assessment Pain Assessment: 0-10 Pain Score: 8  Pain Location: L knee Pain Descriptors / Indicators: Discomfort, Grimacing, Guarding, Heaviness, Moaning, Sore Pain Intervention(s): Limited activity within patient's tolerance, Monitored during session, Repositioned    Home Living Family/patient expects to be discharged to:: Private residence (recommending SNF at this time) Living Arrangements: Spouse/significant other Available Help at Discharge: Family;Available 24 hours/day Type of Home: House Home Access: Stairs to enter Entrance Stairs-Rails: Right;Left Entrance Stairs-Number of Steps: 4 Alternate Level Stairs-Number of Steps: flight Home Layout: Multi-level Home Equipment: Conservation officer, nature (2 wheels);Cane - single point Additional Comments: Pt able to provide what appears to be accurrate subjective history, but need to clarify with husband.    Prior Function Prior Level of Function : Independent/Modified Independent             Mobility Comments: pt reports being independent without AD       Hand Dominance   Dominant Hand: Right    Extremity/Trunk Assessment   Upper Extremity Assessment Upper Extremity Assessment: Defer to OT evaluation    Lower Extremity Assessment Lower Extremity Assessment: RLE deficits/detail;LLE deficits/detail;Generalized weakness RLE Deficits / Details: tingling LLE Deficits / Details: tingling LLE: Unable to fully assess due to pain    Cervical / Trunk Assessment Cervical / Trunk Assessment: Normal  Communication   Communication: No difficulties  Cognition Arousal/Alertness: Awake/alert Behavior During Therapy: WFL for tasks assessed/performed Overall Cognitive Status: No family/caregiver present to determine baseline cognitive functioning                                 General Comments: per  chart review, pt with history of dementia. Pt able to provide what appeared to be accurrate subjective history intake, however demonstrated poor motor planning/sequencing and decreased awareness with mobility (ex: pt expressing need to urinate but unaware that she had purewick in place)        General Comments      Exercises     Assessment/Plan    PT Assessment Patient needs continued PT services  PT Problem List Decreased strength;Decreased range of motion;Decreased activity tolerance;Decreased balance;Decreased mobility;Decreased coordination;Impaired sensation;Cardiopulmonary status limiting activity;Decreased safety awareness;Decreased knowledge of use of DME;Decreased cognition;Pain       PT Treatment Interventions DME instruction;Gait training;Stair training;Functional mobility training;Therapeutic activities;Therapeutic exercise;Patient/family education;Cognitive remediation;Neuromuscular re-education;Balance training    PT Goals (Current goals can be found in the Care Plan section)  Acute Rehab PT Goals Patient Stated Goal: did not state PT Goal Formulation: With patient Time For Goal Achievement: 09/04/22 Potential to Achieve Goals: Good    Frequency Min 2X/week     Co-evaluation               AM-PAC PT "6 Clicks" Mobility  Outcome Measure Help needed turning from your back to your side while in a flat bed without using bedrails?: A Lot Help needed moving from lying on your back to sitting on the side of a flat bed without using bedrails?: A Lot Help needed moving to and from a bed  to a chair (including a wheelchair)?: Total Help needed standing up from a chair using your arms (e.g., wheelchair or bedside chair)?: A Lot Help needed to walk in hospital room?: A Lot Help needed climbing 3-5 steps with a railing? : Total 6 Click Score: 10    End of Session Equipment Utilized During Treatment: Gait belt Activity Tolerance: Patient limited by pain Patient left:  in bed;with call bell/phone within reach;with bed alarm set Nurse Communication: Mobility status PT Visit Diagnosis: Unsteadiness on feet (R26.81);Other abnormalities of gait and mobility (R26.89);Muscle weakness (generalized) (M62.81);Pain Pain - Right/Left: Left Pain - part of body: Leg    Time: 1126-1150 PT Time Calculation (min) (ACUTE ONLY): 24 min   Charges:   PT Evaluation $PT Eval Moderate Complexity: 1 Mod PT Treatments $Therapeutic Activity: 8-22 mins        Becky Sax PT, DPT  Blenda Nicely 08/21/2022, 12:03 PM

## 2022-08-22 DIAGNOSIS — G9341 Metabolic encephalopathy: Secondary | ICD-10-CM | POA: Diagnosis not present

## 2022-08-22 DIAGNOSIS — G40909 Epilepsy, unspecified, not intractable, without status epilepticus: Secondary | ICD-10-CM | POA: Diagnosis present

## 2022-08-22 DIAGNOSIS — Z96652 Presence of left artificial knee joint: Secondary | ICD-10-CM | POA: Diagnosis present

## 2022-08-22 DIAGNOSIS — G928 Other toxic encephalopathy: Secondary | ICD-10-CM | POA: Diagnosis present

## 2022-08-22 DIAGNOSIS — I472 Ventricular tachycardia, unspecified: Secondary | ICD-10-CM | POA: Diagnosis present

## 2022-08-22 DIAGNOSIS — Z8249 Family history of ischemic heart disease and other diseases of the circulatory system: Secondary | ICD-10-CM | POA: Diagnosis not present

## 2022-08-22 DIAGNOSIS — E860 Dehydration: Secondary | ICD-10-CM | POA: Diagnosis present

## 2022-08-22 DIAGNOSIS — F0280 Dementia in other diseases classified elsewhere without behavioral disturbance: Secondary | ICD-10-CM | POA: Diagnosis present

## 2022-08-22 DIAGNOSIS — T402X5A Adverse effect of other opioids, initial encounter: Secondary | ICD-10-CM | POA: Diagnosis present

## 2022-08-22 DIAGNOSIS — G3109 Other frontotemporal dementia: Secondary | ICD-10-CM | POA: Diagnosis present

## 2022-08-22 DIAGNOSIS — Z79899 Other long term (current) drug therapy: Secondary | ICD-10-CM | POA: Diagnosis not present

## 2022-08-22 DIAGNOSIS — Z800 Family history of malignant neoplasm of digestive organs: Secondary | ICD-10-CM | POA: Diagnosis not present

## 2022-08-22 DIAGNOSIS — T424X5A Adverse effect of benzodiazepines, initial encounter: Secondary | ICD-10-CM | POA: Diagnosis present

## 2022-08-22 DIAGNOSIS — T428X5A Adverse effect of antiparkinsonism drugs and other central muscle-tone depressants, initial encounter: Secondary | ICD-10-CM | POA: Diagnosis present

## 2022-08-22 DIAGNOSIS — N179 Acute kidney failure, unspecified: Secondary | ICD-10-CM | POA: Diagnosis present

## 2022-08-22 DIAGNOSIS — T40601A Poisoning by unspecified narcotics, accidental (unintentional), initial encounter: Secondary | ICD-10-CM | POA: Diagnosis not present

## 2022-08-22 DIAGNOSIS — M47816 Spondylosis without myelopathy or radiculopathy, lumbar region: Secondary | ICD-10-CM | POA: Diagnosis present

## 2022-08-22 MED ORDER — PANTOPRAZOLE SODIUM 20 MG PO TBEC: 20.0000 mg | DELAYED_RELEASE_TABLET | Freq: Every day | ORAL | Status: DC

## 2022-08-22 MED ORDER — SODIUM CHLORIDE 0.9 % IV SOLN: INTRAVENOUS | Status: DC

## 2022-08-22 MED ORDER — CLOBAZAM 10 MG PO TABS: 10.0000 mg | ORAL_TABLET | Freq: Every day | ORAL | Status: DC

## 2022-08-22 MED ORDER — DOXYCYCLINE HYCLATE 100 MG PO TABS: 100.0000 mg | ORAL_TABLET | Freq: Two times a day (BID) | ORAL | Status: DC

## 2022-08-22 MED ORDER — OXYCODONE HCL 5 MG PO TABS
5.0000 mg | ORAL_TABLET | ORAL | Status: DC | PRN
  Administered 2022-08-22: 5 mg via ORAL
  Filled 2022-08-22: qty 1

## 2022-08-22 NOTE — Evaluation (Addendum)
Occupational Therapy Evaluation Patient Details Name: [name] MRN: [id number] DOB: [birth year] Today's Date: 08/22/2022   History of Present Illness Pt is a 66 y/o F presenting with drowsiness s/p L TKA on 8/22. Found to have Acute metabolic encephalopathy:  Likely due to polypharmacy in the setting of acute kidney injury. PMH includes fronto temporal dementia, paroxysmal V. tach on propafenone, seizure disorder left knee arthritis.   Clinical Impression   This 66 yo female admitted with above presents to acute OT with needing increased A with basic ADLs and mobility since her knee surgery on 8/22 and even more so since readmission for acute metabolic encephalopathy. She currently needs min A-total A for basic ADLs and Max A to roll in bed. She will continue to benefit from acute OT with follow up at SNF recommended and discussed with husband due to pt's fronto-temporal dementia and deciding whether it would be best for SNF v. HH with [facility] services. Right now pt would need excessive physical A and husband is open to short term SNF stay for rehab. We will continue to follow.     Recommendations for follow up therapy are one component of a multi-disciplinary discharge planning process, led by the attending physician.  Recommendations may be updated based on patient status, additional functional criteria and insurance authorization.   Follow Up Recommendations  Skilled nursing-short term rehab (<3 hours/day)    Assistance Recommended at Discharge Frequent or constant Supervision/Assistance  Patient can return home with the following Two people to help with walking and/or transfers;Two people to help with bathing/dressing/bathroom;Assistance with cooking/housework;Assistance with feeding;Help with stairs or ramp for entrance;Assist for transportation;Direct supervision/assist for financial management;Direct supervision/assist for medications management    Functional Status Assessment  Patient  has had a recent decline in their functional status and demonstrates the ability to make significant improvements in function in a reasonable and predictable amount of time.  Equipment Recommendations  Other (comment) (TBD next venue)       Precautions / Restrictions Precautions Precautions: Fall Precaution Comments: L TKA on 8/22 Restrictions Weight Bearing Restrictions: No      Mobility Bed Mobility Overal bed mobility: Needs Assistance Bed Mobility: Rolling Rolling: Max assist                     ADL either performed or assessed with clinical judgement   ADL Overall ADL's : Needs assistance/impaired Eating/Feeding: Minimal assistance;Bed level   Grooming: Minimal assistance;Bed level   Upper Body Bathing: Moderate assistance;Bed level   Lower Body Bathing: Total assistance;Bed level   Upper Body Dressing : Maximal assistance;Bed level   Lower Body Dressing: Total assistance;Bed level                  Pt did 5 reps of AAROM while supine in bed for knee flexion/extension and hip abduction/adduction with Max A and Max VCs. Intent was to then get pt up to EOB; however pt incontinent of bowel so had to get pt cleaned up.     Vision Baseline Vision/History: 1 Wears glasses Ability to See in Adequate Light: 0 Adequate Patient Visual Report: No change from baseline              Pertinent Vitals/Pain Pain Assessment Pain Assessment: PAINAD Breathing: normal Negative Vocalization: occasional moan/groan, low speech, negative/disapproving quality Facial Expression: sad, frightened, frown Body Language: tense, distressed pacing, fidgeting Consolability: no need to console PAINAD Score: 3 Pain Location: L knee Pain Descriptors / Indicators: Discomfort, Grimacing,  Guarding, Sore, Moaning Pain Intervention(s): Limited activity within patient's tolerance, Monitored during session, Repositioned     Hand Dominance Right      Communication  Communication Communication:  (dysarthric at times)   Cognition Arousal/Alertness: Awake/alert Behavior During Therapy: WFL for tasks assessed/performed Overall Cognitive Status: History of cognitive impairments - at baseline                                 General Comments: husband reports she is getting closer and closer back to her baseline each day from a cognitive position                Home Living Family/patient expects to be discharged to:: Skilled nursing facility Living Arrangements: Spouse/significant other Available Help at Discharge: Family;Available 24 hours/day Type of Home: House Home Access: Stairs to enter [employer] of Steps: 1 threshold Entrance Stairs-Rails: Right;Left Home Layout: Multi-level Alternate Level Stairs-Number of Steps: 5--split level; does have chiar lift down into basement, but pt has not been able to use it as of late due to her bad left knee prior to sx Alternate Level Stairs-Rails: Right;Left Bathroom Shower/Tub: Occupational psychologist: Handicapped height     Home Equipment: Conservation officer, nature (2 wheels);Cane - single point;BSC/3in1          Prior Functioning/Environment Prior Level of Function : Needs assist  Cognitive Assist : Mobility (cognitive);ADLs (cognitive) Mobility (Cognitive):  (S) ADLs (Cognitive):  (S)         ADLs Comments: pt needs S for all mobility and ADLs due to fronto-temporal dementia        OT Problem List: Decreased range of motion;Impaired balance (sitting and/or standing);Decreased cognition;Decreased safety awareness;Decreased coordination;Pain      OT Treatment/Interventions: Self-care/ADL training;DME and/or AE instruction;Patient/family education;Balance training    OT Goals(Current goals can be found in the care plan section) Acute Rehab OT Goals Patient Stated Goal: pt's husband open to her going to SNF for rehab for short term since she has had a delay in  getting started with her therapy for her L TKA OT Goal Formulation: With family Time For Goal Achievement: 09/05/22 Potential to Achieve Goals: Good  OT Frequency: Min 2X/week       AM-PAC OT "6 Clicks" Daily Activity     Outcome Measure Help from another person eating meals?: A Little Help from another person taking care of personal grooming?: A Little Help from another person toileting, which includes using toliet, bedpan, or urinal?: Total Help from another person bathing (including washing, rinsing, drying)?: A Lot Help from another person to put on and taking off regular upper body clothing?: A Lot Help from another person to put on and taking off regular lower body clothing?: Total 6 Click Score: 12   End of Session    Activity Tolerance: Patient tolerated treatment well Patient left: in bed;with call bell/phone within reach;with bed alarm set;with family/visitor present  OT Visit Diagnosis: Unsteadiness on feet (R26.81);Other abnormalities of gait and mobility (R26.89);Muscle weakness (generalized) (M62.81);Other symptoms and signs involving cognitive function;Pain Pain - Right/Left: Left Pain - part of body: Knee                Time: 6568-1275 OT Time Calculation (min): 49 min Charges:  OT General Charges $OT Visit: 1 Visit OT Evaluation $OT Eval Moderate Complexity: 1 Mod OT Treatments $Self Care/Home Management : 23-37 mins  [place] [address], OTR/L Acute Rehab  [facility] [phone] Office [phone]    Almon Register 08/22/2022, 3:05 PM

## 2022-08-22 NOTE — Progress Notes (Signed)
[facility] PROGRESS NOTE    Progress Note  [name]  MBT:[id number] DOB: 08/14/1956 DOA: 08/20/2022 PCP: Aretta Nip, MD     Brief Narrative:   [name] is an 66 y.o. female past medical history significant for dementia, paroxysmal V. tach on propafenone, seizure disorder left knee arthritis status post knee replacement on Tuesday comes in for with drowsiness, she was discharged on antibiotics oxycodone every 6 hours and antispasmodic since then the family has noted that the patient has been more drowsy.  Assessment/Plan:   Acute metabolic encephalopathy: Likely due to polypharmacy in the setting of acute kidney injury. Hold oxycodone, Robaxin, benzos, discontinue Dilaudid. Her mentation is back to baseline according to her husband. Continue IV fluid hydration for an additional 24 hours. For therapy evaluated the patient recommended skilled nursing facility. We will see has been consulted.  AKI (acute kidney injury) ([place]) With a baseline creatinine of less than 1, on admission 2.1 likely prerenal azotemia in the setting of decreased oral intake. Creatinine continues to improve we will continue IV fluids for an additional 24 hours recheck a basic metabolic panel in the morning.  History of VT (ventricular tachycardia) (HCC) Continue propafenone  Seizure disorder ([place]) None continue current medication.  Patient with knee surgery: Continue Lovenox for DVT prophylaxis. Continue doxycycline twice a day.  Mild frontotemporal dementia: Mentation seems to be at baseline.  DVT prophylaxis: lovenox Family Communication:husband Status is: Observation The patient remains OBS appropriate and will d/c before 2 midnights.    Code Status:     Code Status Orders  (From admission, onward)           Start     Ordered   08/20/22 1423  Full code  Continuous        08/20/22 1423           Code Status History     Date Active Date Inactive Code  Status Order ID Comments User Context   09/06/2019 1951 09/10/2019 1935 Full Code [id number]  Nita Sells, MD Inpatient   04/29/2017 1224 05/03/2017 2014 Full Code [id number]  Charlie Pitter, PA-C Inpatient      Advance Directive Documentation    Flowsheet Row Most Recent Value  Type of Advance Directive Healthcare Power of [place], Living will  Pre-existing out of facility DNR order (yellow form or pink MOST form) --  "MOST" Form in Place? --         IV Access:   Peripheral IV   Procedures and diagnostic studies:   DG Chest 1 View  Result Date: 08/20/2022 CLINICAL DATA:  Altered mental status. EXAM: CHEST  1 VIEW COMPARISON:  Chest radiograph 09/06/2019 FINDINGS: The cardiomediastinal silhouette is within normal limits allowing for AP technique and low lung volumes. Mild opacity is present in the left greater than right lung bases, likely atelectasis. No edema, sizable pleural effusion, or pneumothorax is identified. No acute osseous abnormality is seen. IMPRESSION: Low lung volumes with bibasilar atelectasis. Electronically Signed   By: Logan Bores M.D.   On: 08/20/2022 15:09     Medical Consultants:   None.   Subjective:    [name] relates her pain is controlled.  Objective:    Vitals:   08/21/22 0400 08/21/22 1050 08/21/22 1939 08/22/22 0821  BP: (!) 111/53 112/74 (!) 144/67 (!) 151/80  Pulse: 81 97 (!) 106 93  Resp: '16 17  16  '$ Temp: 98.1 F (36.7 C) 98 F (36.7 C) 98.6 F (  37 C) 98 F (36.7 C)  TempSrc: Oral Oral Oral Oral  SpO2: 98% 96% 94% 97%  Weight:      Height:       SpO2: 97 %   Intake/Output Summary (Last 24 hours) at 08/22/2022 1055 Last data filed at 08/22/2022 0730 Gross per 24 hour  Intake 653.17 ml  Output 3900 ml  Net -3246.83 ml    Filed Weights   08/20/22 1745  Weight: 68 kg    Exam: General exam: In no acute distress. Respiratory system: Good air movement and clear to auscultation. Cardiovascular system: S1 & S2  heard, RRR. No JVD. Gastrointestinal system: Abdomen is nondistended, soft and nontender.  Extremities: No pedal edema. Skin: No rashes, lesions or ulcers Psychiatry: Judgement and insight appear normal. Mood & affect appropriate.   Data Reviewed:    Labs: Basic Metabolic Panel: Recent Labs  Lab 08/20/22 1150 08/21/22 0317  NA 131* 133*  K 3.9 4.1  CL 97* 102  CO2 26 23  GLUCOSE 99 91  BUN 40* 42*  CREATININE 2.14* 1.51*  CALCIUM 8.4* 8.2*  MG  --  1.6*    GFR Estimated Creatinine Clearance: 34.7 mL/min (A) (by C-G formula based on SCr of 1.51 mg/dL (H)). Liver Function Tests: Recent Labs  Lab 08/20/22 1804  AST 13*  ALT 8  ALKPHOS 57  BILITOT 0.3  PROT 4.9*  ALBUMIN 2.3*    No results for input(s): "LIPASE", "AMYLASE" in the last 168 hours. No results for input(s): "AMMONIA" in the last 168 hours. Coagulation profile No results for input(s): "INR", "PROTIME" in the last 168 hours. COVID-19 Labs  No results for input(s): "DDIMER", "FERRITIN", "LDH", "CRP" in the last 72 hours.  Lab Results  Component Value Date   SARSCOV2NAA NEGATIVE 09/06/2019    CBC: Recent Labs  Lab 08/20/22 1150  WBC 7.5  NEUTROABS 5.0  HGB 10.1*  HCT 29.5*  MCV 96.4  PLT 134*    Cardiac Enzymes: No results for input(s): "CKTOTAL", "CKMB", "CKMBINDEX", "TROPONINI" in the last 168 hours. BNP (last 3 results) No results for input(s): "PROBNP" in the last 8760 hours. CBG: No results for input(s): "GLUCAP" in the last 168 hours. D-Dimer: No results for input(s): "DDIMER" in the last 72 hours. Hgb A1c: No results for input(s): "HGBA1C" in the last 72 hours. Lipid Profile: No results for input(s): "CHOL", "HDL", "LDLCALC", "TRIG", "CHOLHDL", "LDLDIRECT" in the last 72 hours. Thyroid function studies: No results for input(s): "TSH", "T4TOTAL", "T3FREE", "THYROIDAB" in the last 72 hours.  Invalid input(s): "FREET3" Anemia work up: No results for input(s): "VITAMINB12",  "FOLATE", "FERRITIN", "TIBC", "IRON", "RETICCTPCT" in the last 72 hours. Sepsis Labs: Recent Labs  Lab 08/20/22 1150 08/20/22 1753  WBC 7.5  --   LATICACIDVEN  --  1.4    Microbiology No results found for this or any previous visit (from the past 240 hour(s)).   Medications:    cloBAZam  10 mg Oral Daily   cloBAZam  15 mg Oral QHS   divalproex  1,000 mg Oral QHS   doxycycline  100 mg Oral Q12H   heparin  5,000 Units Subcutaneous Q12H   melatonin  10 mg Oral QHS   pantoprazole  40 mg Oral Daily   polyethylene glycol  17 g Oral BID   propafenone  225 mg Oral BID   Continuous Infusions:      LOS: 0 days   Charlynne Cousins  [facility]  08/22/2022, 10:55 AM

## 2022-08-23 DIAGNOSIS — N179 Acute kidney failure, unspecified: Secondary | ICD-10-CM | POA: Diagnosis not present

## 2022-08-23 MED ORDER — SODIUM CHLORIDE 0.9 % IV SOLN: INTRAVENOUS | Status: DC

## 2022-08-23 NOTE — Plan of Care (Signed)

## 2022-08-23 NOTE — Progress Notes (Signed)
[facility] PROGRESS NOTE    Progress Note  [name]  OVF:[id number] DOB: 09-25-1956 DOA: 08/20/2022 PCP: Aretta Nip, MD     Brief Narrative:   [name] is an 66 y.o. female past medical history significant for dementia, paroxysmal V. tach on propafenone, seizure disorder left knee arthritis status post knee replacement on Tuesday comes in for with drowsiness, she was discharged on antibiotics oxycodone every 6 hours and antispasmodic since then the family has noted that the patient has been more drowsy.  Assessment/Plan:   Acute metabolic encephalopathy: Likely due to polypharmacy in the setting of acute kidney injury. Hold oxycodone, Robaxin, benzos, discontinue Dilaudid. Her mentation is back to baseline according to her husband. KVO IV fluids. Physical therapy has evaluated the patient. Awaiting skilled nursing facility placement.  AKI (acute kidney injury) ([place]) With a baseline creatinine of less than 1, on admission 2.1 likely prerenal azotemia in the setting of decreased oral intake. Resolved with IV fluid hydration creatinine has returned to baseline.  History of VT (ventricular tachycardia) (HCC) Continue propafenone  Seizure disorder (HCC) Continue Depakote. Hold benzodiazepine morning dose.  Patient with knee surgery: Continue Lovenox for DVT prophylaxis. Continue doxycycline twice a day.  Mild frontotemporal dementia: Mentation seems to be at baseline.  DVT prophylaxis: lovenox Family Communication:husband Status is: Observation The patient remains OBS appropriate and will d/c before 2 midnights.    Code Status:     Code Status Orders  (From admission, onward)           Start     Ordered   08/20/22 1423  Full code  Continuous        08/20/22 1423           Code Status History     Date Active Date Inactive Code Status Order ID Comments User Context   09/06/2019 1951 09/10/2019 1935 Full Code [id number]  Nita Sells, MD Inpatient   04/29/2017 1601 05/03/2017 2014 Full Code [id number]  Charlie Pitter, PA-C Inpatient      Advance Directive Documentation    Flowsheet Row Most Recent Value  Type of Advance Directive Healthcare Power of Attorney, Living will  Pre-existing out of facility DNR order (yellow form or pink MOST form) --  "MOST" Form in Place? --         IV Access:   Peripheral IV   Procedures and diagnostic studies:   No results found.   Medical Consultants:   None.   Subjective:    Javaria M Buxton complaints today.  Objective:    Vitals:   08/21/22 1939 08/22/22 0821 08/22/22 1938 08/23/22 0858  BP: (!) 144/67 (!) 151/80 (!) 145/68 (!) 146/80  Pulse: (!) 106 93 99 88  Resp:  '16 17 14  '$ Temp: 98.6 F (37 C) 98 F (36.7 C) 98.1 F (36.7 C) 98.1 F (36.7 C)  TempSrc: Oral Oral Oral Oral  SpO2: 94% 97% 96% 97%  Weight:      Height:       SpO2: 97 %   Intake/Output Summary (Last 24 hours) at 08/23/2022 1114 Last data filed at 08/23/2022 0948 Gross per 24 hour  Intake 780.99 ml  Output 900 ml  Net -119.01 ml    Filed Weights   08/20/22 1745  Weight: 68 kg    Exam: General exam: In no acute distress. Respiratory system: Good air movement and clear to auscultation. Cardiovascular system: S1 & S2 heard, RRR. No JVD. Gastrointestinal system: Abdomen  is nondistended, soft and nontender.  Extremities: No pedal edema. Skin: No rashes, lesions or ulcers Psychiatry: Sleepy this morning.   Data Reviewed:    Labs: Basic Metabolic Panel: Recent Labs  Lab 08/20/22 1150 08/21/22 0317  NA 131* 133*  K 3.9 4.1  CL 97* 102  CO2 26 23  GLUCOSE 99 91  BUN 40* 42*  CREATININE 2.14* 1.51*  CALCIUM 8.4* 8.2*  MG  --  1.6*    GFR Estimated Creatinine Clearance: 34.7 mL/min (A) (by C-G formula based on SCr of 1.51 mg/dL (H)). Liver Function Tests: Recent Labs  Lab 08/20/22 1804  AST 13*  ALT 8  ALKPHOS 57  BILITOT 0.3  PROT 4.9*  ALBUMIN  2.3*    No results for input(s): "LIPASE", "AMYLASE" in the last 168 hours. No results for input(s): "AMMONIA" in the last 168 hours. Coagulation profile No results for input(s): "INR", "PROTIME" in the last 168 hours. COVID-19 Labs  No results for input(s): "DDIMER", "FERRITIN", "LDH", "CRP" in the last 72 hours.  Lab Results  Component Value Date   SARSCOV2NAA NEGATIVE 09/06/2019    CBC: Recent Labs  Lab 08/20/22 1150  WBC 7.5  NEUTROABS 5.0  HGB 10.1*  HCT 29.5*  MCV 96.4  PLT 134*    Cardiac Enzymes: No results for input(s): "CKTOTAL", "CKMB", "CKMBINDEX", "TROPONINI" in the last 168 hours. BNP (last 3 results) No results for input(s): "PROBNP" in the last 8760 hours. CBG: No results for input(s): "GLUCAP" in the last 168 hours. D-Dimer: No results for input(s): "DDIMER" in the last 72 hours. Hgb A1c: No results for input(s): "HGBA1C" in the last 72 hours. Lipid Profile: No results for input(s): "CHOL", "HDL", "LDLCALC", "TRIG", "CHOLHDL", "LDLDIRECT" in the last 72 hours. Thyroid function studies: No results for input(s): "TSH", "T4TOTAL", "T3FREE", "THYROIDAB" in the last 72 hours.  Invalid input(s): "FREET3" Anemia work up: No results for input(s): "VITAMINB12", "FOLATE", "FERRITIN", "TIBC", "IRON", "RETICCTPCT" in the last 72 hours. Sepsis Labs: Recent Labs  Lab 08/20/22 1150 08/20/22 1753  WBC 7.5  --   LATICACIDVEN  --  1.4    Microbiology No results found for this or any previous visit (from the past 240 hour(s)).   Medications:    cloBAZam  10 mg Oral Daily   cloBAZam  15 mg Oral QHS   divalproex  1,000 mg Oral QHS   doxycycline  100 mg Oral Q12H   heparin  5,000 Units Subcutaneous Q12H   melatonin  10 mg Oral QHS   pantoprazole  40 mg Oral Daily   propafenone  225 mg Oral BID   Continuous Infusions:  sodium chloride 75 mL/hr at 08/23/22 0023       LOS: 1 day   Charlynne Cousins  [facility]  08/23/2022, 11:14 AM

## 2022-08-23 NOTE — TOC Initial Note (Signed)
Transition of Care [facility]) - Initial/Assessment Note    Patient Details  Name: [name] MRN: [id number] Date of Birth: 09-24-1956  Transition of Care [employer]) CM/SW Contact:    Amador Cunas, [place] Phone Number: 08/23/2022, 3:38 PM  Clinical Narrative:   SW spoke with pt's husband re PT/OT recommendation for SNF. Pt with no previous SNF stay. Explained SNF placement process and answered questions. Kara Dies request submitted, ref # [id number]. Will need to update Navi with facility choice when known.   Wandra Feinstein, MSW, LCSW [phone] (coverage)                  Expected Discharge Plan: Skilled Nursing Facility Barriers to Discharge: Insurance Authorization, SNF Pending bed offer, Continued Medical Work up   Patient Goals and CMS Choice     Choice offered to / list presented to : Spouse  Expected Discharge Plan and Services Expected Discharge Plan: [place]       Living arrangements for the past 2 months: Single Family Home                                      Prior Living Arrangements/Services Living arrangements for the past 2 months: Single Family Home Lives with:: Spouse          Need for Family Participation in Patient Care: Yes (Comment) Care giver support system in place?: Yes (comment)   Criminal Activity/Legal Involvement Pertinent to Current Situation/Hospitalization: No - Comment as needed  Activities of Daily Living Home Assistive Devices/Equipment: Bedside commode/3-in-1, Transfer belt, Walker (specify type) ADL Screening (condition at time of admission) Patient's cognitive ability adequate to safely complete daily activities?: No Is the patient deaf or have difficulty hearing?: No Does the patient have difficulty seeing, even when wearing glasses/contacts?: No Does the patient have difficulty concentrating, remembering, or making decisions?: Yes Patient able to express need for assistance with ADLs?: No Does  the patient have difficulty dressing or bathing?: Yes Independently performs ADLs?: No Communication: Needs assistance Is this a change from baseline?: Pre-admission baseline Dressing (OT): Needs assistance Is this a change from baseline?: Pre-admission baseline Grooming: Needs assistance Is this a change from baseline?: Pre-admission baseline Feeding: Needs assistance Is this a change from baseline?: Pre-admission baseline Bathing: Needs assistance Is this a change from baseline?: Pre-admission baseline Toileting: Needs assistance Is this a change from baseline?: Pre-admission baseline In/Out Bed: Needs assistance Is this a change from baseline?: Pre-admission baseline Walks in Home: Needs assistance Is this a change from baseline?: Pre-admission baseline Does the patient have difficulty walking or climbing stairs?: Yes Weakness of Legs: Both Weakness of Arms/Hands: None  Permission Sought/Granted Permission sought to share information with : Facility Art therapist granted to share information with : Yes, Verbal Permission Granted              Emotional Assessment       Orientation: : Fluctuating Orientation (Suspected and/or reported Sundowners) Alcohol / Substance Use: Not Applicable Psych Involvement: No (comment)  Admission diagnosis:  AKI (acute kidney injury) ([place]) [N17.9] Opiate overdose, accidental or unintentional, initial encounter [facility]) [T40.601A] Patient Active Problem List   Diagnosis Date Noted   Acute metabolic encephalopathy 02/40/9735   AKI (acute kidney injury) ([place]) 08/20/2022   Hip fracture ([place]) 09/06/2019   Seizure disorder ([place])    History of VT (ventricular tachycardia) ([place]) 04/29/2017   Atypical chest pain  04/15/2017   Localization-related (focal) (partial) symptomatic epilepsy and epileptic syndromes with complex partial seizures, not intractable, without status epilepticus ([place]) 02/10/2016   PCP:  Aretta Nip,  MD Pharmacy:   [id number]: [phone]: [phone]    Social Determinants of Health (SDOH) Interventions    Readmission Risk Interventions     No data to display

## 2022-08-23 NOTE — NC FL2 (Signed)
[place] LEVEL OF CARE SCREENING TOOL     IDENTIFICATION  Patient Name: [name] Birthdate: [birth year] Sex: female Admission Date (Current Location): 08/20/2022  [facility] and [place] Number:  Herbalist and Address:  [facility] [place] [place] [address] [place], [place] [address]      Provider Number: [id number]  Attending Physician Name and Address:  Charlynne Cousins, MD  Relative Name and Phone Number:       Current Level of Care: Hospital Recommended Level of Care: [place] Prior Approval Number:    Date Approved/Denied:   PASRR Number: [id number]  Discharge Plan: SNF    Current Diagnoses: Patient Active Problem List   Diagnosis Date Noted   Acute metabolic encephalopathy 32/01/3342   AKI (acute kidney injury) ([place]) 08/20/2022   Hip fracture ([place]) 09/06/2019   Seizure disorder ([place])    History of VT (ventricular tachycardia) ([place]) 04/29/2017   Atypical chest pain 04/15/2017   Localization-related (focal) (partial) symptomatic epilepsy and epileptic syndromes with complex partial seizures, not intractable, without status epilepticus ([place]) 02/10/2016    Orientation RESPIRATION BLADDER Height & Weight     Self  Normal Incontinent Weight: 150 lb (68 kg) Height:  '5\' 4"'$  (162.6 cm)  BEHAVIORAL SYMPTOMS/MOOD NEUROLOGICAL BOWEL NUTRITION STATUS      Incontinent    AMBULATORY STATUS COMMUNICATION OF NEEDS Skin   Extensive Assist Verbally Normal                       Personal Care Assistance Level of Assistance  Bathing, Feeding, Dressing Bathing Assistance: Limited assistance Feeding assistance: Limited assistance Dressing Assistance: Maximum assistance     Functional Limitations Info  Sight, Hearing, Speech Sight Info: Adequate Hearing Info: Adequate Speech Info: Adequate    SPECIAL CARE FACTORS FREQUENCY  PT (By licensed PT), OT (By licensed OT)                    Contractures  Contractures Info: Not present    Additional Factors Info                  Current Medications (08/23/2022):  This is the current hospital active medication list Current Facility-Administered Medications  Medication Dose Route Frequency Provider Last Rate Last Admin   0.9 %  sodium chloride infusion   Intravenous Continuous Charlynne Cousins, MD       bisacodyl (DULCOLAX) EC tablet 5 mg  5 mg Oral Daily PRN Wynetta Fines T, MD       cloBAZam (ONFI) tablet 15 mg  15 mg Oral QHS Charlynne Cousins, MD   15 mg at 08/22/22 2214   divalproex (DEPAKOTE ER) 24 hr tablet 1,000 mg  1,000 mg Oral QHS Wynetta Fines T, MD   1,000 mg at 08/22/22 2213   doxycycline (VIBRA-TABS) tablet 100 mg  100 mg Oral Q12H Wynetta Fines T, MD   100 mg at 08/23/22 0942   heparin injection 5,000 Units  5,000 Units Subcutaneous Q12H Lequita Halt, MD   5,000 Units at 08/23/22 5686   HYDROcodone-acetaminophen (NORCO/VICODIN) 5-325 MG per tablet 1 tablet  1 tablet Oral Q6H PRN Charlynne Cousins, MD       melatonin tablet 10 mg  10 mg Oral QHS Wynetta Fines T, MD   10 mg at 08/22/22 2212   ondansetron (ZOFRAN) tablet 4 mg  4 mg Oral Q6H PRN Lequita Halt, MD  Or   ondansetron (ZOFRAN) injection 4 mg  4 mg Intravenous Q6H PRN Wynetta Fines T, MD       pantoprazole (PROTONIX) EC tablet 40 mg  40 mg Oral Daily Wynetta Fines T, MD   40 mg at 08/23/22 5670   propafenone (RYTHMOL SR) 12 hr capsule 225 mg  225 mg Oral BID Wynetta Fines T, MD   225 mg at 08/23/22 1410   senna-docusate (Senokot-S) tablet 1 tablet  1 tablet Oral QHS PRN Lequita Halt, MD         Discharge Medications: Please see discharge summary for a list of discharge medications.  Relevant Imaging Results:  Relevant Lab Results:   Additional Information VU#[id number]  Amador Cunas, [place]

## 2022-08-24 DIAGNOSIS — M25561 Pain in right knee: Secondary | ICD-10-CM | POA: Diagnosis not present

## 2022-08-24 DIAGNOSIS — F0390 Unspecified dementia without behavioral disturbance: Secondary | ICD-10-CM | POA: Diagnosis not present

## 2022-08-24 DIAGNOSIS — N179 Acute kidney failure, unspecified: Secondary | ICD-10-CM | POA: Diagnosis not present

## 2022-08-24 DIAGNOSIS — E46 Unspecified protein-calorie malnutrition: Secondary | ICD-10-CM | POA: Diagnosis not present

## 2022-08-24 DIAGNOSIS — M25562 Pain in left knee: Secondary | ICD-10-CM | POA: Diagnosis not present

## 2022-08-24 DIAGNOSIS — I472 Ventricular tachycardia, unspecified: Secondary | ICD-10-CM | POA: Diagnosis not present

## 2022-08-24 DIAGNOSIS — G9341 Metabolic encephalopathy: Secondary | ICD-10-CM | POA: Diagnosis not present

## 2022-08-24 DIAGNOSIS — M6281 Muscle weakness (generalized): Secondary | ICD-10-CM | POA: Diagnosis not present

## 2022-08-24 DIAGNOSIS — Z7401 Bed confinement status: Secondary | ICD-10-CM | POA: Diagnosis not present

## 2022-08-24 DIAGNOSIS — R569 Unspecified convulsions: Secondary | ICD-10-CM | POA: Diagnosis not present

## 2022-08-24 DIAGNOSIS — R4189 Other symptoms and signs involving cognitive functions and awareness: Secondary | ICD-10-CM | POA: Diagnosis not present

## 2022-08-24 DIAGNOSIS — R52 Pain, unspecified: Secondary | ICD-10-CM | POA: Diagnosis not present

## 2022-08-24 DIAGNOSIS — M170 Bilateral primary osteoarthritis of knee: Secondary | ICD-10-CM | POA: Diagnosis not present

## 2022-08-24 DIAGNOSIS — Z96652 Presence of left artificial knee joint: Secondary | ICD-10-CM | POA: Diagnosis not present

## 2022-08-24 DIAGNOSIS — G40909 Epilepsy, unspecified, not intractable, without status epilepticus: Secondary | ICD-10-CM | POA: Diagnosis not present

## 2022-08-24 DIAGNOSIS — K219 Gastro-esophageal reflux disease without esophagitis: Secondary | ICD-10-CM | POA: Diagnosis not present

## 2022-08-24 DIAGNOSIS — K5900 Constipation, unspecified: Secondary | ICD-10-CM | POA: Diagnosis not present

## 2022-08-24 DIAGNOSIS — G928 Other toxic encephalopathy: Secondary | ICD-10-CM | POA: Diagnosis not present

## 2022-08-24 DIAGNOSIS — Z471 Aftercare following joint replacement surgery: Secondary | ICD-10-CM | POA: Diagnosis not present

## 2022-08-24 DIAGNOSIS — G3109 Other frontotemporal dementia: Secondary | ICD-10-CM | POA: Diagnosis not present

## 2022-08-24 DIAGNOSIS — R531 Weakness: Secondary | ICD-10-CM | POA: Diagnosis not present

## 2022-08-24 DIAGNOSIS — F0280 Dementia in other diseases classified elsewhere without behavioral disturbance: Secondary | ICD-10-CM | POA: Diagnosis not present

## 2022-08-24 DIAGNOSIS — I10 Essential (primary) hypertension: Secondary | ICD-10-CM | POA: Diagnosis not present

## 2022-08-24 DIAGNOSIS — G8322 Monoplegia of upper limb affecting left dominant side: Secondary | ICD-10-CM | POA: Diagnosis not present

## 2022-08-24 DIAGNOSIS — R262 Difficulty in walking, not elsewhere classified: Secondary | ICD-10-CM | POA: Diagnosis not present

## 2022-08-24 DIAGNOSIS — Z96659 Presence of unspecified artificial knee joint: Secondary | ICD-10-CM | POA: Diagnosis not present

## 2022-08-24 DIAGNOSIS — T40601A Poisoning by unspecified narcotics, accidental (unintentional), initial encounter: Secondary | ICD-10-CM | POA: Diagnosis not present

## 2022-08-24 MED ORDER — OXYCODONE HCL 5 MG PO TABS: 5.0000 mg | ORAL_TABLET | ORAL | 0 refills | Status: AC | PRN

## 2022-08-24 MED ORDER — SODIUM CHLORIDE 0.9 % IV SOLN: INTRAVENOUS | Status: AC

## 2022-08-24 NOTE — TOC Progression Note (Addendum)
Transition of Care [facility]) - Progression Note    Patient Details  Name: [name] MRN: [id number] Date of Birth: April 26, 1956  Transition [facility]) CM/SW [place], RN Phone Number: 08/24/2022, 11:18 AM  Clinical Narrative:    CM met with the patient and husband at the bedside to offer Medicare choice for SNF placement.  The patient's husband chose [facility] and the facility has bed availability for the patient.  I called [employer] through [facility] and updated choice of facility and received authorization approval for placement - Auth # [id number] for 3 days - 08/24/2022 - 08/26/2022.    I updated bedside nursing and attending physician on approval for SNF placement - pending discharge summary and orders. Discharge summary will be updated in the hub for the facility.  PTAR will be arranged to the facility this afternoon at 2 pm.  Bedside nursing - Please call nursing report to [facility] at [phone].  CM will continue to follow the patient for SNF placement at [facility].       Expected Discharge Plan: [place] Barriers to Discharge: No Barriers Identified  Expected Discharge Plan and Services Expected Discharge Plan: [place] Choice: [place] Living arrangements for the past 2 months: Single Family Home                                       Social Determinants of Health (SDOH) Interventions    Readmission Risk Interventions    08/24/2022   11:18 AM  Readmission Risk Prevention Plan  Post Dischage Appt Complete  Medication Screening Complete  Transportation Screening Complete

## 2022-08-24 NOTE — Progress Notes (Signed)
Physical Therapy Treatment Patient Details Name: [name] MRN: [id number] DOB: 10/23/1956 Today's Date: 08/24/2022   History of Present Illness Pt is a 66 y/o F presenting with drowsiness s/p L TKA on 8/22. Found to have Acute metabolic encephalopathy:  Likely due to polypharmacy in the setting of acute kidney injury. PMH includes fronto temporal dementia, paroxysmal V. tach on propafenone, seizure disorder left knee arthritis.    PT Comments    Pt pleasant and agreeable to session. Pt overall requiring max physical and cuing assist for bed mobility and x2 repeated transfers at EOB, unable to take steps given pt pain, anxiety, and poor foot placement. Pt also soiled in stool, requiring significant clean up assist help once return to supine. Pt's husband present during session and supportive. PT to continue to follow.      Recommendations for follow up therapy are one component of a multi-disciplinary discharge planning process, led by the attending physician.  Recommendations may be updated based on patient status, additional functional criteria and insurance authorization.  Follow Up Recommendations  Skilled nursing-short term rehab (<3 hours/day) Can patient physically be transported by private vehicle: No   Assistance Recommended at Discharge Frequent or constant Supervision/Assistance  Patient can return home with the following Two people to help with walking and/or transfers;A lot of help with bathing/dressing/bathroom;Assistance with cooking/housework;Direct supervision/assist for medications management;Direct supervision/assist for financial management;Assist for transportation;Help with stairs or ramp for entrance   Equipment Recommendations  Other (comment) (tbd)    Recommendations for Other Services       Precautions / Restrictions Precautions Precautions: Fall Precaution Comments: L TKA on 8/22 Restrictions Weight Bearing Restrictions: No     Mobility  Bed  Mobility Overal bed mobility: Needs Assistance Bed Mobility: Supine to Sit, Sit to Supine Rolling: Max assist   Supine to sit: Max assist Sit to supine: Max assist   General bed mobility comments: assist for trunk and LE management, to/from EOB scooting with assist of pad, rolling bilat for pericare    Transfers Overall transfer level: Needs assistance Equipment used: Rolling walker (2 wheels) Transfers: Sit to/from Stand Sit to Stand: Max assist, +2 safety/equipment           General transfer comment: assist for power up, rise, steadying, +2 for steadying and RW stabilization. STS x2 from EOB, pt tremulous suspect secondary to anxiety.    Ambulation/Gait                   Stairs             Wheelchair Mobility    Modified Rankin (Stroke Patients Only)       Balance Overall balance assessment: Needs assistance Sitting-balance support: Bilateral upper extremity supported, Feet supported Sitting balance-Leahy Scale: Fair   Postural control: Posterior lean Standing balance support: Bilateral upper extremity supported, During functional activity, Reliant on assistive device for balance (RW) Standing balance-Leahy Scale: Zero Standing balance comment: max +2                            Cognition Arousal/Alertness: Awake/alert Behavior During Therapy: WFL for tasks assessed/performed Overall Cognitive Status: History of cognitive impairments - at baseline                                          Exercises Total Joint Exercises Ankle  Circles/Pumps: 10 reps, AAROM, Both Quad Sets: AAROM, 10 reps, Left, Supine Heel Slides: AAROM, Left, 10 reps, Supine Long Arc Quad: AAROM, Left, 10 reps, Seated    General Comments        Pertinent Vitals/Pain Pain Assessment Pain Assessment: Faces Faces Pain Scale: Hurts little more Pain Location: L knee Pain Descriptors / Indicators: Discomfort, Grimacing, Guarding, Sore,  Moaning Pain Intervention(s): Limited activity within patient's tolerance, Monitored during session, Repositioned    Home Living                          Prior Function            PT Goals (current goals can now be found in the care plan section) Acute Rehab PT Goals Patient Stated Goal: did not state PT Goal Formulation: With patient Time For Goal Achievement: 09/04/22 Potential to Achieve Goals: Good Progress towards PT goals: Progressing toward goals    Frequency    Min 2X/week      PT Plan Current plan remains appropriate    Co-evaluation              AM-PAC PT "6 Clicks" Mobility   Outcome Measure  Help needed turning from your back to your side while in a flat bed without using bedrails?: A Lot Help needed moving from lying on your back to sitting on the side of a flat bed without using bedrails?: A Lot Help needed moving to and from a bed to a chair (including a wheelchair)?: Total Help needed standing up from a chair using your arms (e.g., wheelchair or bedside chair)?: Total Help needed to walk in hospital room?: Total Help needed climbing 3-5 steps with a railing? : Total 6 Click Score: 8    End of Session   Activity Tolerance: Patient limited by fatigue;Patient limited by pain Patient left: in bed;with call bell/phone within reach;with bed alarm set;with family/visitor present Nurse Communication: Mobility status PT Visit Diagnosis: Unsteadiness on feet (R26.81);Other abnormalities of gait and mobility (R26.89);Muscle weakness (generalized) (M62.81);Pain Pain - Right/Left: Left Pain - part of body: Leg     Time: 8182-9937 PT Time Calculation (min) (ACUTE ONLY): 32 min  Charges:  $Therapeutic Exercise: 8-22 mins $Therapeutic Activity: 8-22 mins                    Stacie Glaze, PT DPT Acute Rehabilitation Services Pager [phone]  Office [phone]    Roxine Caddy E Ruffin Pyo 08/24/2022, 11:52 AM

## 2022-08-24 NOTE — Discharge Summary (Signed)
Physician Discharge Summary  [name] KKX:[id number] DOB: 04/15/1956 DOA: 08/20/2022  PCP: Aretta Nip, MD  Admit date: 08/20/2022 Discharge date: 08/24/2022  Admitted From: Home Disposition:  SNF  Recommendations for Outpatient Follow-up:  Follow up with PCP in 1-2 weeks Please obtain BMP/CBC in one week Palliative care to meet with family at facility to discuss end-of-life due to progressive dementia   Home Health:No Equipment/Devices:None  Discharge Condition:Stable CODE STATUS:Full Diet recommendation: Heart Healthy  Brief/Interim Summary: 66 y.o. female past medical history significant for dementia, paroxysmal V. tach on propafenone, seizure disorder left knee arthritis status post knee replacement on Tuesday comes in for with drowsiness, she was discharged on antibiotics oxycodone every 6 hours and antispasmodic since then the family has noted that the patient has been more drowsy.  Discharge Diagnoses:  Principal Problem:   AKI (acute kidney injury) ([place]) Active Problems:   History of VT (ventricular tachycardia) (HCC)   Seizure disorder (HCC)   Acute metabolic encephalopathy  Acute metabolic encephalopathy: Likely due to polypharmacy in the setting of acute kidney injury her oxycodone, Robaxin benzos and Dilaudid were discontinued. She was fluid sedated and creatinine returned to baseline. Physical therapy evaluated the patient recommended skilled nursing facility.  Acute kidney injury: Likely prerenal azotemia resolved with IV fluid hydration.  History of VT's: Continue propafenone.  Seizure disorder: No changes made continue Depakote and benzodiazepines.  Recent knee surgery: Consider Lovenox for DVT prophylaxis. We will continue doxycycline twice a day.  Mild frontal temporal dementia: Mentation is turned to baseline according to family.  Discharge Instructions  Discharge Instructions     Diet - low sodium heart healthy   Complete by: As  directed    Increase activity slowly   Complete by: As directed    No wound care   Complete by: As directed       Allergies as of 08/24/2022   No Known Allergies      Medication List     STOP taking these medications    methocarbamol 500 MG tablet Commonly known as: ROBAXIN   multivitamin capsule       TAKE these medications    acetaminophen 500 MG tablet Commonly known as: TYLENOL Take 2 tablets by mouth as needed for pain.   CALCIUM PO Take 1 capsule by mouth daily.   cloBAZam 10 MG tablet Commonly known as: ONFI Take 1 tablet (10 mg) in the mornings and one and a half tablets (15 mg) at bedtime by moth daily.   divalproex 500 MG 24 hr tablet Commonly known as: DEPAKOTE ER Take 1,000 mg by mouth at bedtime.   doxycycline 100 MG tablet Commonly known as: VIBRA-TABS Take 100 mg by mouth 2 (two) times daily.   lansoprazole 30 MG capsule Commonly known as: PREVACID Take 30 mg by mouth 2 (two) times daily.   Melatonin 10 MG Caps Take 1 capsule by mouth at bedtime.   oxyCODONE 5 MG immediate release tablet Commonly known as: Oxy IR/ROXICODONE Take 1 tablet (5 mg total) by mouth every 4 (four) hours as needed for up to 3 days.   polyethylene glycol 17 g packet Commonly known as: MIRALAX / GLYCOLAX Take 17 g by mouth daily.   propafenone 225 MG 12 hr capsule Commonly known as: RYTHMOL SR TAKE 1 CAPSULE BY MOUTH TWICE A DAY   PROTONIX PO Take 1 capsule by mouth 2 (two) times daily.   VITAMIN D PO Take 1 capsule by mouth daily.  No Known Allergies  Consultations: None   Procedures/Studies: DG Chest 1 View  Result Date: 08/20/2022 CLINICAL DATA:  Altered mental status. EXAM: CHEST  1 VIEW COMPARISON:  Chest radiograph 09/06/2019 FINDINGS: The cardiomediastinal silhouette is within normal limits allowing for AP technique and low lung volumes. Mild opacity is present in the left greater than right lung bases, likely atelectasis. No  edema, sizable pleural effusion, or pneumothorax is identified. No acute osseous abnormality is seen. IMPRESSION: Low lung volumes with bibasilar atelectasis. Electronically Signed   By: Logan Bores M.D.   On: 08/20/2022 15:09   (Echo, Carotid, EGD, Colonoscopy, ERCP)    Subjective: No complaints  Discharge Exam: Vitals:   08/23/22 2000 08/24/22 0756  BP: 127/70 103/61  Pulse: 97 88  Resp: 14 15  Temp: 99.1 F (37.3 C)   SpO2: 97% 98%   Vitals:   08/22/22 1938 08/23/22 0858 08/23/22 2000 08/24/22 0756  BP: (!) 145/68 (!) 146/80 127/70 103/61  Pulse: 99 88 97 88  Resp: '17 14 14 15  '$ Temp: 98.1 F (36.7 C) 98.1 F (36.7 C) 99.1 F (37.3 C)   TempSrc: Oral Oral Oral   SpO2: 96% 97% 97% 98%  Weight:      Height:        General: Pt is alert, awake, not in acute distress Cardiovascular: RRR, S1/S2 +, no rubs, no gallops Respiratory: CTA bilaterally, no wheezing, no rhonchi Abdominal: Soft, NT, ND, bowel sounds + Extremities: no edema, no cyanosis    The results of significant diagnostics from this hospitalization (including imaging, microbiology, ancillary and laboratory) are listed below for reference.     Microbiology: No results found for this or any previous visit (from the past 240 hour(s)).   Labs: BNP (last 3 results) No results for input(s): "BNP" in the last 8760 hours. Basic Metabolic Panel: Recent Labs  Lab 08/20/22 1150 08/21/22 0317  NA 131* 133*  K 3.9 4.1  CL 97* 102  CO2 26 23  GLUCOSE 99 91  BUN 40* 42*  CREATININE 2.14* 1.51*  CALCIUM 8.4* 8.2*  MG  --  1.6*   Liver Function Tests: Recent Labs  Lab 08/20/22 1804  AST 13*  ALT 8  ALKPHOS 57  BILITOT 0.3  PROT 4.9*  ALBUMIN 2.3*   No results for input(s): "LIPASE", "AMYLASE" in the last 168 hours. No results for input(s): "AMMONIA" in the last 168 hours. CBC: Recent Labs  Lab 08/20/22 1150  WBC 7.5  NEUTROABS 5.0  HGB 10.1*  HCT 29.5*  MCV 96.4  PLT 134*   Cardiac  Enzymes: No results for input(s): "CKTOTAL", "CKMB", "CKMBINDEX", "TROPONINI" in the last 168 hours. BNP: Invalid input(s): "POCBNP" CBG: No results for input(s): "GLUCAP" in the last 168 hours. D-Dimer No results for input(s): "DDIMER" in the last 72 hours. Hgb A1c No results for input(s): "HGBA1C" in the last 72 hours. Lipid Profile No results for input(s): "CHOL", "HDL", "LDLCALC", "TRIG", "CHOLHDL", "LDLDIRECT" in the last 72 hours. Thyroid function studies No results for input(s): "TSH", "T4TOTAL", "T3FREE", "THYROIDAB" in the last 72 hours.  Invalid input(s): "FREET3" Anemia work up No results for input(s): "VITAMINB12", "FOLATE", "FERRITIN", "TIBC", "IRON", "RETICCTPCT" in the last 72 hours. Urinalysis    Component Value Date/Time   COLORURINE STRAW (A) 09/08/2019 1700   APPEARANCEUR CLEAR 09/08/2019 1700   LABSPEC 1.009 09/08/2019 1700   PHURINE 5.0 09/08/2019 1700   GLUCOSEU 50 (A) 09/08/2019 1700   HGBUR NEGATIVE 09/08/2019 1700   BILIRUBINUR  NEGATIVE 09/08/2019 1700   KETONESUR NEGATIVE 09/08/2019 1700   PROTEINUR NEGATIVE 09/08/2019 1700   UROBILINOGEN 0.2 08/26/2014 1546   NITRITE NEGATIVE 09/08/2019 1700   LEUKOCYTESUR NEGATIVE 09/08/2019 1700   Sepsis Labs Recent Labs  Lab 08/20/22 1150  WBC 7.5   Microbiology No results found for this or any previous visit (from the past 240 hour(s)).   Time coordinating discharge: Over 30 minutes  SIGNED:   Charlynne Cousins, MD  [facility] 08/24/2022, 11:25 AM Pager   If 7PM-7AM, please contact night-coverage [email or web address] Password [email or web address]

## 2022-08-25 DIAGNOSIS — G40909 Epilepsy, unspecified, not intractable, without status epilepticus: Secondary | ICD-10-CM | POA: Diagnosis not present

## 2022-08-25 DIAGNOSIS — F0280 Dementia in other diseases classified elsewhere without behavioral disturbance: Secondary | ICD-10-CM | POA: Diagnosis not present

## 2022-08-25 DIAGNOSIS — E46 Unspecified protein-calorie malnutrition: Secondary | ICD-10-CM | POA: Diagnosis not present

## 2022-08-25 DIAGNOSIS — G3109 Other frontotemporal dementia: Secondary | ICD-10-CM | POA: Diagnosis not present

## 2022-08-26 DIAGNOSIS — R4189 Other symptoms and signs involving cognitive functions and awareness: Secondary | ICD-10-CM | POA: Diagnosis not present

## 2022-08-26 DIAGNOSIS — M25561 Pain in right knee: Secondary | ICD-10-CM | POA: Diagnosis not present

## 2022-08-26 DIAGNOSIS — G8322 Monoplegia of upper limb affecting left dominant side: Secondary | ICD-10-CM | POA: Diagnosis not present

## 2022-08-26 DIAGNOSIS — R262 Difficulty in walking, not elsewhere classified: Secondary | ICD-10-CM | POA: Diagnosis not present

## 2022-08-28 DIAGNOSIS — N179 Acute kidney failure, unspecified: Secondary | ICD-10-CM | POA: Diagnosis not present

## 2022-08-28 DIAGNOSIS — F0390 Unspecified dementia without behavioral disturbance: Secondary | ICD-10-CM | POA: Diagnosis not present

## 2022-08-28 DIAGNOSIS — Z96659 Presence of unspecified artificial knee joint: Secondary | ICD-10-CM | POA: Diagnosis not present

## 2022-08-28 DIAGNOSIS — K219 Gastro-esophageal reflux disease without esophagitis: Secondary | ICD-10-CM | POA: Diagnosis not present

## 2022-08-28 DIAGNOSIS — I472 Ventricular tachycardia, unspecified: Secondary | ICD-10-CM | POA: Diagnosis not present

## 2022-08-28 DIAGNOSIS — K5900 Constipation, unspecified: Secondary | ICD-10-CM | POA: Diagnosis not present

## 2022-08-28 DIAGNOSIS — R569 Unspecified convulsions: Secondary | ICD-10-CM | POA: Diagnosis not present

## 2022-08-31 DIAGNOSIS — E46 Unspecified protein-calorie malnutrition: Secondary | ICD-10-CM | POA: Diagnosis not present

## 2022-08-31 DIAGNOSIS — G3109 Other frontotemporal dementia: Secondary | ICD-10-CM | POA: Diagnosis not present

## 2022-08-31 DIAGNOSIS — G40909 Epilepsy, unspecified, not intractable, without status epilepticus: Secondary | ICD-10-CM | POA: Diagnosis not present

## 2022-08-31 DIAGNOSIS — F0280 Dementia in other diseases classified elsewhere without behavioral disturbance: Secondary | ICD-10-CM | POA: Diagnosis not present

## 2022-09-02 DIAGNOSIS — M25561 Pain in right knee: Secondary | ICD-10-CM | POA: Diagnosis not present

## 2022-09-02 DIAGNOSIS — R262 Difficulty in walking, not elsewhere classified: Secondary | ICD-10-CM | POA: Diagnosis not present

## 2022-09-02 DIAGNOSIS — G8322 Monoplegia of upper limb affecting left dominant side: Secondary | ICD-10-CM | POA: Diagnosis not present

## 2022-09-02 DIAGNOSIS — R4189 Other symptoms and signs involving cognitive functions and awareness: Secondary | ICD-10-CM | POA: Diagnosis not present

## 2022-09-04 ENCOUNTER — Non-Acute Institutional Stay: Payer: Self-pay

## 2022-09-04 VITALS — BP 116/80 | HR 98 | Temp 97.6°F

## 2022-09-04 DIAGNOSIS — Z515 Encounter for palliative care: Secondary | ICD-10-CM

## 2022-09-04 DIAGNOSIS — F0390 Unspecified dementia without behavioral disturbance: Secondary | ICD-10-CM | POA: Diagnosis not present

## 2022-09-04 DIAGNOSIS — R569 Unspecified convulsions: Secondary | ICD-10-CM | POA: Diagnosis not present

## 2022-09-04 DIAGNOSIS — Z96659 Presence of unspecified artificial knee joint: Secondary | ICD-10-CM | POA: Diagnosis not present

## 2022-09-04 DIAGNOSIS — M25562 Pain in left knee: Secondary | ICD-10-CM | POA: Diagnosis not present

## 2022-09-04 DIAGNOSIS — R52 Pain, unspecified: Secondary | ICD-10-CM | POA: Diagnosis not present

## 2022-09-04 NOTE — Progress Notes (Signed)
PATIENT NAME: [name] DOB: 1956/09/08 MRN: [id number]  PRIMARY CARE PROVIDER: Aretta Nip, MD  RESPONSIBLE PARTY:  Acct ID - Guarantor Home Phone Work Phone Relationship Acct Type  [id number] [name]  Self P/F     [address], [place], [place] [address]   Joint visit completed with patient and spouse Darrell.  Palliative Care explained.  ACP:  Patient advises her spouse is her HCPOA.   No documents on file.  Full code status at this time.   Dementia:  Patient found in her bed awake.  She has removed left knee surgical strips that were due to be removed today.  Spouse will contact orthopedic office to advise and will still see provider for scheduled appointment this afternoon.  Patient is alert to self, family and location. Engages in conversation easily and shares her hobbies of reading and garden club.  She is active in her church and feels she has adequate spiritual support at this time.  She is hopeful to return home soon. Currently has a brief in place but patient states she is normally able to use the bathroom at home.  Endorses dribbling of urine on occasion and wears a pad for this at home.  Patient is able to ambulate with 1 person assistance. Spouse advises patient does need retraining with ambulation.  She has been favoring her right leg prior to knee replacement.  She continues to work with therapy in the facility.  Staff advised patient does not have a discharge date at this time and PT is continuing to work gait stability.  At home patient requires supervision with ADL's which spouse assists with.   HISTORY OF PRESENT ILLNESS:  66 year old female with recent left knee replacement.  Hospitalized for AKI  and possible overdose of oxycodone.  Patient is followed by Palliative Care every 4-8 weeks and PRN.   CODE STATUS: Full ADVANCED DIRECTIVES: Yes-not on file MOST FORM: No PPS: 40%   PHYSICAL EXAM:   VITALS: Today's Vitals   09/04/22 0934  BP:  116/80  Pulse: 98  Temp: 97.6 F (36.4 C)  SpO2: 96%    LUNGS: clear to auscultation  CARDIAC: Cor RRR}  EXTREMITIES: - for edema SKIN: Skin color, texture, turgor normal. No rashes or lesions or left knee incision healed.   NEURO: positive for gait problems       Lorenza Burton, RN

## 2022-09-08 DIAGNOSIS — G40909 Epilepsy, unspecified, not intractable, without status epilepticus: Secondary | ICD-10-CM | POA: Diagnosis not present

## 2022-09-08 DIAGNOSIS — E46 Unspecified protein-calorie malnutrition: Secondary | ICD-10-CM | POA: Diagnosis not present

## 2022-09-08 DIAGNOSIS — G3109 Other frontotemporal dementia: Secondary | ICD-10-CM | POA: Diagnosis not present

## 2022-09-08 DIAGNOSIS — F0280 Dementia in other diseases classified elsewhere without behavioral disturbance: Secondary | ICD-10-CM | POA: Diagnosis not present

## 2022-09-10 DIAGNOSIS — R262 Difficulty in walking, not elsewhere classified: Secondary | ICD-10-CM | POA: Diagnosis not present

## 2022-09-10 DIAGNOSIS — Z96659 Presence of unspecified artificial knee joint: Secondary | ICD-10-CM | POA: Diagnosis not present

## 2022-09-10 DIAGNOSIS — F0390 Unspecified dementia without behavioral disturbance: Secondary | ICD-10-CM | POA: Diagnosis not present

## 2022-09-10 DIAGNOSIS — M6281 Muscle weakness (generalized): Secondary | ICD-10-CM | POA: Diagnosis not present

## 2022-09-10 DIAGNOSIS — M25562 Pain in left knee: Secondary | ICD-10-CM | POA: Diagnosis not present

## 2022-09-10 DIAGNOSIS — K219 Gastro-esophageal reflux disease without esophagitis: Secondary | ICD-10-CM | POA: Diagnosis not present

## 2022-09-10 DIAGNOSIS — R569 Unspecified convulsions: Secondary | ICD-10-CM | POA: Diagnosis not present

## 2022-09-15 DIAGNOSIS — M1712 Unilateral primary osteoarthritis, left knee: Secondary | ICD-10-CM | POA: Diagnosis not present

## 2022-09-15 DIAGNOSIS — R2689 Other abnormalities of gait and mobility: Secondary | ICD-10-CM | POA: Diagnosis not present

## 2022-09-15 DIAGNOSIS — Z96652 Presence of left artificial knee joint: Secondary | ICD-10-CM | POA: Diagnosis not present

## 2022-09-15 DIAGNOSIS — R278 Other lack of coordination: Secondary | ICD-10-CM | POA: Diagnosis not present

## 2022-09-15 DIAGNOSIS — R293 Abnormal posture: Secondary | ICD-10-CM | POA: Diagnosis not present

## 2022-09-15 DIAGNOSIS — M6281 Muscle weakness (generalized): Secondary | ICD-10-CM | POA: Diagnosis not present

## 2022-09-17 DIAGNOSIS — R278 Other lack of coordination: Secondary | ICD-10-CM | POA: Diagnosis not present

## 2022-09-17 DIAGNOSIS — R293 Abnormal posture: Secondary | ICD-10-CM | POA: Diagnosis not present

## 2022-09-17 DIAGNOSIS — Z96652 Presence of left artificial knee joint: Secondary | ICD-10-CM | POA: Diagnosis not present

## 2022-09-17 DIAGNOSIS — R2689 Other abnormalities of gait and mobility: Secondary | ICD-10-CM | POA: Diagnosis not present

## 2022-09-17 DIAGNOSIS — M1712 Unilateral primary osteoarthritis, left knee: Secondary | ICD-10-CM | POA: Diagnosis not present

## 2022-09-17 DIAGNOSIS — M6281 Muscle weakness (generalized): Secondary | ICD-10-CM | POA: Diagnosis not present

## 2022-09-22 DIAGNOSIS — M6281 Muscle weakness (generalized): Secondary | ICD-10-CM | POA: Diagnosis not present

## 2022-09-22 DIAGNOSIS — R278 Other lack of coordination: Secondary | ICD-10-CM | POA: Diagnosis not present

## 2022-09-22 DIAGNOSIS — Z96652 Presence of left artificial knee joint: Secondary | ICD-10-CM | POA: Diagnosis not present

## 2022-09-22 DIAGNOSIS — R2689 Other abnormalities of gait and mobility: Secondary | ICD-10-CM | POA: Diagnosis not present

## 2022-09-22 DIAGNOSIS — R293 Abnormal posture: Secondary | ICD-10-CM | POA: Diagnosis not present

## 2022-09-22 DIAGNOSIS — M1712 Unilateral primary osteoarthritis, left knee: Secondary | ICD-10-CM | POA: Diagnosis not present

## 2022-09-23 DIAGNOSIS — Z6827 Body mass index (BMI) 27.0-27.9, adult: Secondary | ICD-10-CM | POA: Diagnosis not present

## 2022-09-23 DIAGNOSIS — Z09 Encounter for follow-up examination after completed treatment for conditions other than malignant neoplasm: Secondary | ICD-10-CM | POA: Diagnosis not present

## 2022-09-23 DIAGNOSIS — N179 Acute kidney failure, unspecified: Secondary | ICD-10-CM | POA: Diagnosis not present

## 2022-09-23 DIAGNOSIS — Z23 Encounter for immunization: Secondary | ICD-10-CM | POA: Diagnosis not present

## 2022-09-29 DIAGNOSIS — M1712 Unilateral primary osteoarthritis, left knee: Secondary | ICD-10-CM | POA: Diagnosis not present

## 2022-09-29 DIAGNOSIS — Z7409 Other reduced mobility: Secondary | ICD-10-CM | POA: Diagnosis not present

## 2022-09-29 DIAGNOSIS — R278 Other lack of coordination: Secondary | ICD-10-CM | POA: Diagnosis not present

## 2022-09-29 DIAGNOSIS — R2681 Unsteadiness on feet: Secondary | ICD-10-CM | POA: Diagnosis not present

## 2022-09-29 DIAGNOSIS — R29898 Other symptoms and signs involving the musculoskeletal system: Secondary | ICD-10-CM | POA: Diagnosis not present

## 2022-09-29 DIAGNOSIS — Z723 Lack of physical exercise: Secondary | ICD-10-CM | POA: Diagnosis not present

## 2022-09-29 DIAGNOSIS — M25662 Stiffness of left knee, not elsewhere classified: Secondary | ICD-10-CM | POA: Diagnosis not present

## 2022-09-29 DIAGNOSIS — Z96652 Presence of left artificial knee joint: Secondary | ICD-10-CM | POA: Diagnosis not present

## 2022-10-01 DIAGNOSIS — M1712 Unilateral primary osteoarthritis, left knee: Secondary | ICD-10-CM | POA: Diagnosis not present

## 2022-10-01 DIAGNOSIS — Z723 Lack of physical exercise: Secondary | ICD-10-CM | POA: Diagnosis not present

## 2022-10-01 DIAGNOSIS — R2681 Unsteadiness on feet: Secondary | ICD-10-CM | POA: Diagnosis not present

## 2022-10-01 DIAGNOSIS — N343 Urethral syndrome, unspecified: Secondary | ICD-10-CM | POA: Diagnosis not present

## 2022-10-01 DIAGNOSIS — R29898 Other symptoms and signs involving the musculoskeletal system: Secondary | ICD-10-CM | POA: Diagnosis not present

## 2022-10-01 DIAGNOSIS — Z7409 Other reduced mobility: Secondary | ICD-10-CM | POA: Diagnosis not present

## 2022-10-01 DIAGNOSIS — M25662 Stiffness of left knee, not elsewhere classified: Secondary | ICD-10-CM | POA: Diagnosis not present

## 2022-10-01 DIAGNOSIS — Z96652 Presence of left artificial knee joint: Secondary | ICD-10-CM | POA: Diagnosis not present

## 2022-10-01 DIAGNOSIS — R278 Other lack of coordination: Secondary | ICD-10-CM | POA: Diagnosis not present

## 2022-10-06 DIAGNOSIS — Z7409 Other reduced mobility: Secondary | ICD-10-CM | POA: Diagnosis not present

## 2022-10-06 DIAGNOSIS — R278 Other lack of coordination: Secondary | ICD-10-CM | POA: Diagnosis not present

## 2022-10-06 DIAGNOSIS — R29898 Other symptoms and signs involving the musculoskeletal system: Secondary | ICD-10-CM | POA: Diagnosis not present

## 2022-10-06 DIAGNOSIS — M1712 Unilateral primary osteoarthritis, left knee: Secondary | ICD-10-CM | POA: Diagnosis not present

## 2022-10-06 DIAGNOSIS — M25662 Stiffness of left knee, not elsewhere classified: Secondary | ICD-10-CM | POA: Diagnosis not present

## 2022-10-06 DIAGNOSIS — R2681 Unsteadiness on feet: Secondary | ICD-10-CM | POA: Diagnosis not present

## 2022-10-06 DIAGNOSIS — Z723 Lack of physical exercise: Secondary | ICD-10-CM | POA: Diagnosis not present

## 2022-10-06 DIAGNOSIS — Z96652 Presence of left artificial knee joint: Secondary | ICD-10-CM | POA: Diagnosis not present

## 2022-10-08 DIAGNOSIS — R29898 Other symptoms and signs involving the musculoskeletal system: Secondary | ICD-10-CM | POA: Diagnosis not present

## 2022-10-08 DIAGNOSIS — Z723 Lack of physical exercise: Secondary | ICD-10-CM | POA: Diagnosis not present

## 2022-10-08 DIAGNOSIS — R2681 Unsteadiness on feet: Secondary | ICD-10-CM | POA: Diagnosis not present

## 2022-10-08 DIAGNOSIS — Z96652 Presence of left artificial knee joint: Secondary | ICD-10-CM | POA: Diagnosis not present

## 2022-10-08 DIAGNOSIS — R278 Other lack of coordination: Secondary | ICD-10-CM | POA: Diagnosis not present

## 2022-10-08 DIAGNOSIS — M1712 Unilateral primary osteoarthritis, left knee: Secondary | ICD-10-CM | POA: Diagnosis not present

## 2022-10-08 DIAGNOSIS — M25662 Stiffness of left knee, not elsewhere classified: Secondary | ICD-10-CM | POA: Diagnosis not present

## 2022-10-08 DIAGNOSIS — Z7409 Other reduced mobility: Secondary | ICD-10-CM | POA: Diagnosis not present

## 2022-10-13 DIAGNOSIS — Z96652 Presence of left artificial knee joint: Secondary | ICD-10-CM | POA: Diagnosis not present

## 2022-10-13 DIAGNOSIS — M1712 Unilateral primary osteoarthritis, left knee: Secondary | ICD-10-CM | POA: Diagnosis not present

## 2022-10-13 DIAGNOSIS — R278 Other lack of coordination: Secondary | ICD-10-CM | POA: Diagnosis not present

## 2022-10-13 DIAGNOSIS — Z7409 Other reduced mobility: Secondary | ICD-10-CM | POA: Diagnosis not present

## 2022-10-13 DIAGNOSIS — R29898 Other symptoms and signs involving the musculoskeletal system: Secondary | ICD-10-CM | POA: Diagnosis not present

## 2022-10-13 DIAGNOSIS — R2681 Unsteadiness on feet: Secondary | ICD-10-CM | POA: Diagnosis not present

## 2022-10-13 DIAGNOSIS — Z723 Lack of physical exercise: Secondary | ICD-10-CM | POA: Diagnosis not present

## 2022-10-13 DIAGNOSIS — M25662 Stiffness of left knee, not elsewhere classified: Secondary | ICD-10-CM | POA: Diagnosis not present

## 2022-10-16 ENCOUNTER — Ambulatory Visit: Payer: Medicare PPO | Admitting: Physician Assistant

## 2022-10-16 DIAGNOSIS — S99922A Unspecified injury of left foot, initial encounter: Secondary | ICD-10-CM | POA: Diagnosis not present
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
## 2022-10-27 ENCOUNTER — Ambulatory Visit: Payer: Medicare PPO | Attending: Physician Assistant | Admitting: Physician Assistant

## 2022-10-27 ENCOUNTER — Encounter: Payer: Self-pay | Admitting: Physician Assistant

## 2022-10-27 VITALS — BP 110/64 | HR 79 | Ht 64.0 in | Wt 187.8 lb

## 2022-10-27 DIAGNOSIS — I472 Ventricular tachycardia, unspecified: Secondary | ICD-10-CM | POA: Diagnosis not present

## 2022-10-27 DIAGNOSIS — R609 Edema, unspecified: Secondary | ICD-10-CM | POA: Diagnosis not present

## 2022-10-27 DIAGNOSIS — Z79899 Other long term (current) drug therapy: Secondary | ICD-10-CM | POA: Diagnosis not present

## 2022-10-27 DIAGNOSIS — R0609 Other forms of dyspnea: Secondary | ICD-10-CM | POA: Diagnosis not present

## 2022-10-27 NOTE — Patient Instructions (Signed)
Medication Instructions:   Your physician recommends that you continue on your current medications as directed. Please refer to the Current Medication list given to you today.  *If you need a refill on your cardiac medications before your next appointment, please call your pharmacy*   Lab Work: [place]    If you have labs (blood work) drawn today and your tests are completely normal, you will receive your results only by: [place] (if you have MyChart) OR A paper copy in the mail If you have any lab test that is abnormal or we need to change your treatment, we will call you to review the results.   Testing/Procedures: NONE ORDERED  TODAY     Follow-Up: At [facility], you and your health needs are our priority.  As part of our continuing mission to provide you with exceptional heart care, we have created designated Provider Care Teams.  These Care Teams include your primary Cardiologist (physician) and Advanced Practice Providers (APPs -  Physician Assistants and Nurse Practitioners) who all work together to provide you with the care you need, when you need it.  We recommend signing up for the patient portal called "MyChart".  Sign up information is provided on this After Visit Summary.  MyChart is used to connect with patients for Virtual Visits (Telemedicine).  Patients are able to view lab/test results, encounter notes, upcoming appointments, etc.  Non-urgent messages can be sent to your provider as well.   To learn more about what you can do with MyChart, go to [email or web address].    Your next appointment:   3 month(s)  The format for your next appointment:   In Person  Provider:   You may see Will Meredith Leeds, MD or one of the following Advanced Practice Providers on your designated Care Team:   Tommye Standard, [place] Legrand Como "Jonni Sanger" Chalmers Cater, [place]    Other Instructions   Important Information About [place]

## 2022-11-01 ENCOUNTER — Other Ambulatory Visit: Payer: Self-pay

## 2022-11-01 ENCOUNTER — Encounter (HOSPITAL_BASED_OUTPATIENT_CLINIC_OR_DEPARTMENT_OTHER): Payer: Self-pay | Admitting: *Deleted

## 2022-11-01 ENCOUNTER — Emergency Department (HOSPITAL_BASED_OUTPATIENT_CLINIC_OR_DEPARTMENT_OTHER)
Admission: EM | Admit: 2022-11-01 | Discharge: 2022-11-02 | Discharge status: Against Medical Advice | Payer: Medicare PPO | Attending: Emergency Medicine | Admitting: Emergency Medicine

## 2022-11-01 DIAGNOSIS — Z5321 Procedure and treatment not carried out due to patient leaving prior to being seen by health care provider: Secondary | ICD-10-CM | POA: Insufficient documentation

## 2022-11-01 DIAGNOSIS — N390 Urinary tract infection, site not specified: Secondary | ICD-10-CM | POA: Diagnosis not present

## 2022-11-01 DIAGNOSIS — R300 Dysuria: Secondary | ICD-10-CM | POA: Diagnosis present

## 2022-11-01 NOTE — ED Triage Notes (Addendum)
Pt went to [facility] as she felt she had a UTI, she was not able to void and they sent her here for further evaluation.  Pt has had burning with urination. They also told her her spo2 was 90% but it is 97% in triage at this time and there was not sob

## 2022-11-02 NOTE — ED Notes (Signed)
Patient called times 3 with no answer by Frann Rider EMT

## 2022-11-10 DIAGNOSIS — R270 Ataxia, unspecified: Secondary | ICD-10-CM | POA: Diagnosis not present

## 2022-11-10 DIAGNOSIS — F0390 Unspecified dementia without behavioral disturbance: Secondary | ICD-10-CM | POA: Diagnosis not present

## 2022-11-10 DIAGNOSIS — G40209 Localization-related (focal) (partial) symptomatic epilepsy and epileptic syndromes with complex partial seizures, not intractable, without status epilepticus: Secondary | ICD-10-CM | POA: Diagnosis not present

## 2022-11-10 DIAGNOSIS — Z87828 Personal history of other (healed) physical injury and trauma: Secondary | ICD-10-CM | POA: Diagnosis not present

## 2022-11-10 DIAGNOSIS — R32 Unspecified urinary incontinence: Secondary | ICD-10-CM | POA: Diagnosis not present

## 2022-11-27 DIAGNOSIS — R350 Frequency of micturition: Secondary | ICD-10-CM | POA: Diagnosis not present

## 2022-11-27 DIAGNOSIS — N3946 Mixed incontinence: Secondary | ICD-10-CM | POA: Diagnosis not present
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
## 2022-12-10 ENCOUNTER — Ambulatory Visit: Payer: Medicare PPO | Admitting: Neurology

## 2022-12-10 ENCOUNTER — Encounter: Payer: Self-pay | Admitting: Neurology

## 2022-12-10 VITALS — BP 118/78 | HR 67 | Ht 65.0 in | Wt 178.0 lb

## 2022-12-10 DIAGNOSIS — R251 Tremor, unspecified: Secondary | ICD-10-CM | POA: Diagnosis not present

## 2022-12-10 DIAGNOSIS — R2689 Other abnormalities of gait and mobility: Secondary | ICD-10-CM

## 2022-12-10 DIAGNOSIS — Z87820 Personal history of traumatic brain injury: Secondary | ICD-10-CM | POA: Diagnosis not present

## 2022-12-10 DIAGNOSIS — G3109 Other frontotemporal dementia: Secondary | ICD-10-CM | POA: Diagnosis not present

## 2022-12-10 DIAGNOSIS — F0280 Dementia in other diseases classified elsewhere without behavioral disturbance: Secondary | ICD-10-CM

## 2022-12-10 NOTE — Patient Instructions (Signed)

## 2022-12-15 ENCOUNTER — Encounter: Payer: Self-pay | Admitting: Neurology

## 2022-12-18 DIAGNOSIS — K5901 Slow transit constipation: Secondary | ICD-10-CM | POA: Diagnosis not present

## 2022-12-18 DIAGNOSIS — K219 Gastro-esophageal reflux disease without esophagitis: Secondary | ICD-10-CM | POA: Diagnosis not present

## 2023-01-08 DIAGNOSIS — N390 Urinary tract infection, site not specified: Secondary | ICD-10-CM | POA: Diagnosis not present

## 2023-01-08 DIAGNOSIS — N3946 Mixed incontinence: Secondary | ICD-10-CM | POA: Diagnosis not present

## 2023-01-08 DIAGNOSIS — R350 Frequency of micturition: Secondary | ICD-10-CM | POA: Diagnosis not present

## 2023-01-11 ENCOUNTER — Emergency Department (HOSPITAL_COMMUNITY): Payer: Medicare PPO

## 2023-01-11 ENCOUNTER — Encounter: Payer: Self-pay | Admitting: Neurology

## 2023-01-11 ENCOUNTER — Other Ambulatory Visit: Payer: Self-pay

## 2023-01-11 ENCOUNTER — Inpatient Hospital Stay (HOSPITAL_COMMUNITY)
Admission: EM | Admit: 2023-01-11 | Discharge: 2023-01-22 | DRG: 871 | Disposition: A | Discharge status: Home Health | Payer: Medicare PPO | Attending: Internal Medicine | Admitting: Internal Medicine

## 2023-01-11 DIAGNOSIS — G9389 Other specified disorders of brain: Secondary | ICD-10-CM | POA: Diagnosis present

## 2023-01-11 DIAGNOSIS — J9600 Acute respiratory failure, unspecified whether with hypoxia or hypercapnia: Secondary | ICD-10-CM

## 2023-01-11 DIAGNOSIS — N390 Urinary tract infection, site not specified: Secondary | ICD-10-CM

## 2023-01-11 DIAGNOSIS — G9349 Other encephalopathy: Secondary | ICD-10-CM | POA: Diagnosis not present

## 2023-01-11 DIAGNOSIS — G40909 Epilepsy, unspecified, not intractable, without status epilepticus: Secondary | ICD-10-CM | POA: Diagnosis present

## 2023-01-11 DIAGNOSIS — Z993 Dependence on wheelchair: Secondary | ICD-10-CM

## 2023-01-11 DIAGNOSIS — R4701 Aphasia: Secondary | ICD-10-CM | POA: Diagnosis present

## 2023-01-11 DIAGNOSIS — G9341 Metabolic encephalopathy: Secondary | ICD-10-CM | POA: Diagnosis not present

## 2023-01-11 DIAGNOSIS — J1282 Pneumonia due to coronavirus disease 2019: Secondary | ICD-10-CM | POA: Diagnosis present

## 2023-01-11 DIAGNOSIS — R109 Unspecified abdominal pain: Secondary | ICD-10-CM | POA: Diagnosis not present

## 2023-01-11 DIAGNOSIS — F0280 Dementia in other diseases classified elsewhere without behavioral disturbance: Secondary | ICD-10-CM | POA: Diagnosis present

## 2023-01-11 DIAGNOSIS — E871 Hypo-osmolality and hyponatremia: Secondary | ICD-10-CM

## 2023-01-11 DIAGNOSIS — R6520 Severe sepsis without septic shock: Secondary | ICD-10-CM | POA: Diagnosis present

## 2023-01-11 DIAGNOSIS — G259 Extrapyramidal and movement disorder, unspecified: Secondary | ICD-10-CM | POA: Diagnosis present

## 2023-01-11 DIAGNOSIS — R7989 Other specified abnormal findings of blood chemistry: Secondary | ICD-10-CM | POA: Diagnosis not present

## 2023-01-11 DIAGNOSIS — F0390 Unspecified dementia without behavioral disturbance: Secondary | ICD-10-CM

## 2023-01-11 DIAGNOSIS — Z96652 Presence of left artificial knee joint: Secondary | ICD-10-CM | POA: Diagnosis present

## 2023-01-11 DIAGNOSIS — A419 Sepsis, unspecified organism: Secondary | ICD-10-CM

## 2023-01-11 DIAGNOSIS — J9601 Acute respiratory failure with hypoxia: Secondary | ICD-10-CM

## 2023-01-11 DIAGNOSIS — Z66 Do not resuscitate: Secondary | ICD-10-CM | POA: Diagnosis present

## 2023-01-11 DIAGNOSIS — Z6830 Body mass index (BMI) 30.0-30.9, adult: Secondary | ICD-10-CM

## 2023-01-11 DIAGNOSIS — R2689 Other abnormalities of gait and mobility: Secondary | ICD-10-CM | POA: Diagnosis not present

## 2023-01-11 DIAGNOSIS — R4182 Altered mental status, unspecified: Secondary | ICD-10-CM | POA: Diagnosis not present

## 2023-01-11 DIAGNOSIS — Z1611 Resistance to penicillins: Secondary | ICD-10-CM | POA: Diagnosis present

## 2023-01-11 DIAGNOSIS — E440 Moderate protein-calorie malnutrition: Secondary | ICD-10-CM | POA: Diagnosis present

## 2023-01-11 DIAGNOSIS — B9620 Unspecified Escherichia coli [E. coli] as the cause of diseases classified elsewhere: Secondary | ICD-10-CM | POA: Diagnosis present

## 2023-01-11 DIAGNOSIS — N179 Acute kidney failure, unspecified: Secondary | ICD-10-CM | POA: Diagnosis not present

## 2023-01-11 DIAGNOSIS — G319 Degenerative disease of nervous system, unspecified: Secondary | ICD-10-CM | POA: Diagnosis not present

## 2023-01-11 DIAGNOSIS — R410 Disorientation, unspecified: Secondary | ICD-10-CM | POA: Diagnosis not present

## 2023-01-11 DIAGNOSIS — R509 Fever, unspecified: Secondary | ICD-10-CM | POA: Diagnosis not present

## 2023-01-11 DIAGNOSIS — U071 COVID-19: Secondary | ICD-10-CM | POA: Diagnosis present

## 2023-01-11 DIAGNOSIS — I472 Ventricular tachycardia, unspecified: Secondary | ICD-10-CM | POA: Diagnosis present

## 2023-01-11 DIAGNOSIS — I10 Essential (primary) hypertension: Secondary | ICD-10-CM | POA: Diagnosis present

## 2023-01-11 DIAGNOSIS — G231 Progressive supranuclear ophthalmoplegia [Steele-Richardson-Olszewski]: Secondary | ICD-10-CM | POA: Diagnosis not present

## 2023-01-11 DIAGNOSIS — R7982 Elevated C-reactive protein (CRP): Secondary | ICD-10-CM | POA: Diagnosis not present

## 2023-01-11 DIAGNOSIS — K7581 Nonalcoholic steatohepatitis (NASH): Secondary | ICD-10-CM | POA: Diagnosis present

## 2023-01-11 DIAGNOSIS — M4802 Spinal stenosis, cervical region: Secondary | ICD-10-CM | POA: Diagnosis present

## 2023-01-11 DIAGNOSIS — Z7401 Bed confinement status: Secondary | ICD-10-CM | POA: Diagnosis not present

## 2023-01-11 DIAGNOSIS — R400 Somnolence: Secondary | ICD-10-CM | POA: Diagnosis not present

## 2023-01-11 DIAGNOSIS — R1312 Dysphagia, oropharyngeal phase: Secondary | ICD-10-CM | POA: Diagnosis present

## 2023-01-11 DIAGNOSIS — J9811 Atelectasis: Secondary | ICD-10-CM | POA: Diagnosis not present

## 2023-01-11 DIAGNOSIS — R0602 Shortness of breath: Secondary | ICD-10-CM | POA: Diagnosis not present

## 2023-01-11 DIAGNOSIS — R52 Pain, unspecified: Secondary | ICD-10-CM | POA: Diagnosis not present

## 2023-01-11 DIAGNOSIS — J690 Pneumonitis due to inhalation of food and vomit: Secondary | ICD-10-CM | POA: Diagnosis not present

## 2023-01-11 DIAGNOSIS — E876 Hypokalemia: Secondary | ICD-10-CM | POA: Diagnosis present

## 2023-01-11 DIAGNOSIS — Z8249 Family history of ischemic heart disease and other diseases of the circulatory system: Secondary | ICD-10-CM

## 2023-01-11 DIAGNOSIS — J069 Acute upper respiratory infection, unspecified: Secondary | ICD-10-CM | POA: Diagnosis not present

## 2023-01-11 DIAGNOSIS — G3109 Other frontotemporal dementia: Secondary | ICD-10-CM | POA: Diagnosis present

## 2023-01-11 DIAGNOSIS — Z800 Family history of malignant neoplasm of digestive organs: Secondary | ICD-10-CM

## 2023-01-11 DIAGNOSIS — Z8679 Personal history of other diseases of the circulatory system: Secondary | ICD-10-CM

## 2023-01-11 DIAGNOSIS — R569 Unspecified convulsions: Secondary | ICD-10-CM | POA: Diagnosis not present

## 2023-01-11 DIAGNOSIS — Z79899 Other long term (current) drug therapy: Secondary | ICD-10-CM

## 2023-01-11 DIAGNOSIS — Z87820 Personal history of traumatic brain injury: Secondary | ICD-10-CM

## 2023-01-11 DIAGNOSIS — R5381 Other malaise: Secondary | ICD-10-CM | POA: Diagnosis present

## 2023-01-11 DIAGNOSIS — R000 Tachycardia, unspecified: Secondary | ICD-10-CM | POA: Diagnosis not present

## 2023-01-11 DIAGNOSIS — E861 Hypovolemia: Secondary | ICD-10-CM | POA: Diagnosis present

## 2023-01-11 DIAGNOSIS — Z79890 Hormone replacement therapy (postmenopausal): Secondary | ICD-10-CM

## 2023-01-11 DIAGNOSIS — R4781 Slurred speech: Secondary | ICD-10-CM | POA: Diagnosis present

## 2023-01-11 DIAGNOSIS — R531 Weakness: Secondary | ICD-10-CM | POA: Diagnosis not present

## 2023-01-11 DIAGNOSIS — R471 Dysarthria and anarthria: Secondary | ICD-10-CM | POA: Diagnosis present

## 2023-01-11 DIAGNOSIS — R262 Difficulty in walking, not elsewhere classified: Secondary | ICD-10-CM | POA: Diagnosis present

## 2023-01-11 DIAGNOSIS — Z96642 Presence of left artificial hip joint: Secondary | ICD-10-CM | POA: Diagnosis present

## 2023-01-11 DIAGNOSIS — R0902 Hypoxemia: Secondary | ICD-10-CM | POA: Diagnosis not present

## 2023-01-11 HISTORY — DX: Sepsis, unspecified organism: A41.9

## 2023-01-11 HISTORY — DX: Unspecified dementia, unspecified severity, without behavioral disturbance, psychotic disturbance, mood disturbance, and anxiety: F03.90

## 2023-01-11 LAB — URINALYSIS, ROUTINE W REFLEX MICROSCOPIC
Bilirubin Urine: NEGATIVE
Glucose, UA: NEGATIVE mg/dL
Hgb urine dipstick: NEGATIVE
Ketones, ur: NEGATIVE mg/dL
Nitrite: NEGATIVE
Protein, ur: NEGATIVE mg/dL
Specific Gravity, Urine: 1.014 (ref 1.005–1.030)
WBC, UA: >50 WBC/hpf — ABNORMAL HIGH (ref 0–5)
pH: 8 (ref 5.0–8.0)

## 2023-01-11 LAB — CBC WITH DIFFERENTIAL/PLATELET
Abs Immature Granulocytes: 0.1 10*3/uL — ABNORMAL HIGH (ref 0.00–0.07)
Basophils Absolute: 0 10*3/uL (ref 0.0–0.1)
Basophils Relative: 0 %
Eosinophils Absolute: 0 10*3/uL (ref 0.0–0.5)
Eosinophils Relative: 1 %
HCT: 42.8 % (ref 36.0–46.0)
Hemoglobin: 14.5 g/dL (ref 12.0–15.0)
Immature Granulocytes: 2 %
Lymphocytes Relative: 4 %
Lymphs Abs: 0.2 10*3/uL — ABNORMAL LOW (ref 0.7–4.0)
MCH: 31.9 pg (ref 26.0–34.0)
MCHC: 33.9 g/dL (ref 30.0–36.0)
MCV: 94.3 fL (ref 80.0–100.0)
Monocytes Absolute: 0.8 10*3/uL (ref 0.1–1.0)
Monocytes Relative: 13 %
Neutro Abs: 4.7 10*3/uL (ref 1.7–7.7)
Neutrophils Relative %: 80 %
Platelets: 113 10*3/uL — ABNORMAL LOW (ref 150–400)
RBC: 4.54 MIL/uL (ref 3.87–5.11)
RDW: 14.9 % (ref 11.5–15.5)
WBC: 5.8 10*3/uL (ref 4.0–10.5)
nRBC: 0.3 % — ABNORMAL HIGH (ref 0.0–0.2)

## 2023-01-11 LAB — APTT: aPTT: 26 seconds (ref 24–36)

## 2023-01-11 LAB — COMPREHENSIVE METABOLIC PANEL
ALT: 86 U/L — ABNORMAL HIGH (ref 0–44)
AST: 189 U/L — ABNORMAL HIGH (ref 15–41)
Albumin: 2.8 g/dL — ABNORMAL LOW (ref 3.5–5.0)
Alkaline Phosphatase: 62 U/L (ref 38–126)
Anion gap: 11 (ref 5–15)
BUN: 24 mg/dL — ABNORMAL HIGH (ref 8–23)
CO2: 25 mmol/L (ref 22–32)
Calcium: 8.9 mg/dL (ref 8.9–10.3)
Chloride: 96 mmol/L — ABNORMAL LOW (ref 98–111)
Creatinine, Ser: 1.05 mg/dL — ABNORMAL HIGH (ref 0.44–1.00)
GFR, Estimated: 58 mL/min — ABNORMAL LOW (ref >60)
Glucose, Bld: 131 mg/dL — ABNORMAL HIGH (ref 70–99)
Potassium: 4.3 mmol/L (ref 3.5–5.1)
Sodium: 132 mmol/L — ABNORMAL LOW (ref 135–145)
Total Bilirubin: 0.6 mg/dL (ref 0.3–1.2)
Total Protein: 6.2 g/dL — ABNORMAL LOW (ref 6.5–8.1)

## 2023-01-11 LAB — PROTIME-INR
INR: 1 (ref 0.8–1.2)
Prothrombin Time: 13.2 seconds (ref 11.4–15.2)

## 2023-01-11 LAB — VALPROIC ACID LEVEL: Valproic Acid Lvl: 77 ug/mL (ref 50.0–100.0)

## 2023-01-11 LAB — LACTIC ACID, PLASMA: Lactic Acid, Venous: 1.8 mmol/L (ref 0.5–1.9)

## 2023-01-11 LAB — CBG MONITORING, ED: Glucose-Capillary: 142 mg/dL — ABNORMAL HIGH (ref 70–99)

## 2023-01-11 MED ORDER — ACETAMINOPHEN 325 MG PO TABS
650.0000 mg | ORAL_TABLET | Freq: Four times a day (QID) | ORAL | Status: DC | PRN
  Administered 2023-01-14 – 2023-01-16 (×2): 650 mg via ORAL
  Filled 2023-01-11 (×2): qty 2

## 2023-01-11 MED ORDER — CLOBAZAM 5 MG PO HALF TABLET
15.0000 mg | ORAL_TABLET | Freq: Every day | ORAL | Status: DC
  Administered 2023-01-13 – 2023-01-21 (×9): 15 mg via ORAL
  Filled 2023-01-11 (×10): qty 1

## 2023-01-11 MED ORDER — SODIUM CHLORIDE 0.9 % IV SOLN
2.0000 g | INTRAVENOUS | Status: DC
  Administered 2023-01-11: 2 g via INTRAVENOUS
  Filled 2023-01-11: qty 20

## 2023-01-11 MED ORDER — SODIUM CHLORIDE 0.9 % IV SOLN: INTRAVENOUS | Status: DC

## 2023-01-11 MED ORDER — CLOBAZAM 5 MG PO HALF TABLET
15.0000 mg | ORAL_TABLET | Freq: Once | ORAL | Status: DC
  Filled 2023-01-11: qty 1

## 2023-01-11 MED ORDER — ENOXAPARIN SODIUM 40 MG/0.4ML IJ SOSY
40.0000 mg | PREFILLED_SYRINGE | Freq: Every day | INTRAMUSCULAR | Status: DC
  Administered 2023-01-12 – 2023-01-22 (×11): 40 mg via SUBCUTANEOUS
  Filled 2023-01-11 (×11): qty 0.4

## 2023-01-11 MED ORDER — ACETAMINOPHEN 650 MG RE SUPP
650.0000 mg | Freq: Once | RECTAL | Status: AC
  Administered 2023-01-11: 650 mg via RECTAL
  Filled 2023-01-11: qty 1

## 2023-01-11 MED ORDER — DIVALPROEX SODIUM ER 500 MG PO TB24
1000.0000 mg | ORAL_TABLET | Freq: Every day | ORAL | Status: DC
  Administered 2023-01-12 – 2023-01-21 (×10): 1000 mg via ORAL
  Filled 2023-01-11 (×12): qty 2

## 2023-01-11 MED ORDER — PROPAFENONE HCL ER 225 MG PO CP12
225.0000 mg | ORAL_CAPSULE | Freq: Two times a day (BID) | ORAL | Status: DC
  Administered 2023-01-12 – 2023-01-22 (×21): 225 mg via ORAL
  Filled 2023-01-11 (×24): qty 1

## 2023-01-11 MED ORDER — LACTATED RINGERS IV SOLN: INTRAVENOUS | Status: AC

## 2023-01-11 MED ORDER — CLOBAZAM 10 MG PO TABS: 10.0000 mg | ORAL_TABLET | Freq: Two times a day (BID) | ORAL | Status: DC

## 2023-01-11 MED ORDER — PANTOPRAZOLE SODIUM 40 MG PO TBEC
40.0000 mg | DELAYED_RELEASE_TABLET | Freq: Every day | ORAL | Status: DC
  Administered 2023-01-12 – 2023-01-22 (×11): 40 mg via ORAL
  Filled 2023-01-11 (×11): qty 1

## 2023-01-11 MED ORDER — SODIUM CHLORIDE 0.9 % IV BOLUS (SEPSIS)
1000.0000 mL | Freq: Once | INTRAVENOUS | Status: AC
  Administered 2023-01-11: 1000 mL via INTRAVENOUS

## 2023-01-11 MED ORDER — CLOBAZAM 10 MG PO TABS
10.0000 mg | ORAL_TABLET | Freq: Every day | ORAL | Status: DC
  Administered 2023-01-12 – 2023-01-22 (×11): 10 mg via ORAL
  Filled 2023-01-11 (×11): qty 1

## 2023-01-11 NOTE — Assessment & Plan Note (Signed)
Mild. Continue IV fluids and follow with morning labs.

## 2023-01-11 NOTE — Assessment & Plan Note (Addendum)
-  later had desaturation to 87% and placed on 2L via [place] -CXR negative and has no respiratory symptoms -COVID/Flu/RSV swab pending -likely due to sepsis -continue to monitor and wean as tolerated with goal O2 >92%.

## 2023-01-11 NOTE — Assessment & Plan Note (Signed)
>>  ASSESSMENT AND PLAN FOR ACUTE HYPOXIC RESPIRATORY FAILURE ([place]) WRITTEN ON 01/11/2023 11:50 PM BY TU, CHING T, DO  -later had desaturation to 87% and placed on 2L via [place] -CXR negative and has no respiratory symptoms -COVID/Flu/RSV swab pending -likely due to sepsis -continue to monitor and wean as tolerated with goal O2 >92%.

## 2023-01-11 NOTE — Assessment & Plan Note (Addendum)
-  presented with fever, tachycardia, tachypnea and positive UA. 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 -continue IV Rocephin pending urine culture -continuous IV fluid overnight

## 2023-01-11 NOTE — ED Notes (Signed)
Patient placed on 2 L [place] d/t oxygen saturations dropping to 86%.

## 2023-01-11 NOTE — ED Provider Notes (Signed)
[facility] EMERGENCY DEPARTMENT Provider Note   CSN: [id number] Arrival date & time: 01/11/23  1757     History  Chief Complaint  Patient presents with   Weakness    [name] is a 67 y.o. female.  HPI     67 year old female comes in with chief complaint of weakness, confusion.  History provided by patient's husband and daughter who is at the bedside.  According to the husband, patient has history of frontal dementia.  She was doing fine this morning when they had breakfast.  Patient then went to take her nap.  During the nap they noted that she was babbling and talking nonsensically.  Thereafter they tried to have her take lunch, but patient was not cooperating.  Patient then started becoming more confused and they decided to bring her into the ER.  Last known normal is about 12 noon.  Patient has history of recurrent UTI and is taking Bactrim prophylactically.  Patient does not have any history of stroke or recent trauma.  She has history of seizure, but has not had an episode in several years.  Home Medications Prior to Admission medications   Medication Sig Start Date End Date Taking? Authorizing Provider  acetaminophen (TYLENOL) 500 MG tablet Take 2 tablets by mouth as needed for pain.    [provider]  CALCIUM PO Take 1 capsule by mouth daily.    [provider]  cloBAZam (ONFI) 10 MG tablet Take 1 tablet (10 mg) in the mornings and one and a half tablets (15 mg) at bedtime by moth daily.    [provider]  divalproex (DEPAKOTE ER) 500 MG 24 hr tablet Take 1,000 mg by mouth at bedtime.     [provider]  lansoprazole (PREVACID) 30 MG capsule Take 30 mg by mouth 2 (two) times daily. 06/10/22   [provider]  Melatonin 10 MG CAPS Take 1 capsule by mouth at bedtime.    [provider]  polyethylene glycol (MIRALAX / GLYCOLAX) packet Take 17 g by mouth daily.    [provider]   propafenone (RYTHMOL SR) 225 MG 12 hr capsule TAKE 1 CAPSULE BY MOUTH TWICE A DAY 05/04/22   Lelon Perla, MD  trimethoprim (TRIMPEX) 100 MG tablet Take 100 mg by mouth daily. 11/30/22   [provider]  VITAMIN D PO Take 1 capsule by mouth daily.    [provider]      Allergies    Patient has no known allergies.    Review of Systems   Review of Systems  All other systems reviewed and are negative.   Physical Exam Updated Vital Signs BP (!) 105/58   Pulse (!) 104   Temp (!) 101.6 F (38.7 C) (Axillary)   Resp 20   SpO2 94%  Physical Exam Vitals and nursing note reviewed.  Constitutional:      Appearance: She is well-developed. She is ill-appearing.  HENT:     Head: Atraumatic.  Eyes:     Extraocular Movements: Extraocular movements intact.  Cardiovascular:     Rate and Rhythm: Tachycardia present.  Pulmonary:     Effort: Pulmonary effort is normal.  Musculoskeletal:     Cervical back: Normal range of motion and neck supple.  Skin:    General: Skin is warm and dry.  Neurological:     Mental Status: She is disoriented.     Comments: Moves all 4 extremities, follows simple commands  ED Results / Procedures / Treatments   Labs (all labs ordered are listed, but only abnormal results are displayed) Labs Reviewed  COMPREHENSIVE METABOLIC PANEL - Abnormal; Notable for the following components:      Result Value   Sodium 132 (*)    Chloride 96 (*)    Glucose, Bld 131 (*)    BUN 24 (*)    Creatinine, Ser 1.05 (*)    Total Protein 6.2 (*)    Albumin 2.8 (*)    AST 189 (*)    ALT 86 (*)    GFR, Estimated 58 (*)    All other components within normal limits  CBC WITH DIFFERENTIAL/PLATELET - Abnormal; Notable for the following components:   Platelets 113 (*)    nRBC 0.3 (*)    Lymphs Abs 0.2 (*)    Abs Immature Granulocytes 0.10 (*)    All other components within normal limits  CBG MONITORING, ED - Abnormal; Notable for the following  components:   Glucose-Capillary 142 (*)    All other components within normal limits  CULTURE, BLOOD (ROUTINE X 2)  CULTURE, BLOOD (ROUTINE X 2)  URINE CULTURE  RESP PANEL BY RT-PCR (RSV, FLU A&B, COVID)  RVPGX2  LACTIC ACID, PLASMA  PROTIME-INR  APTT  LACTIC ACID, PLASMA  URINALYSIS, ROUTINE W REFLEX MICROSCOPIC  VALPROIC ACID LEVEL    EKG EKG Interpretation  Date/Time:  Monday January 11 2023 18:15:42 EST Ventricular Rate:  105 PR Interval:  211 QRS Duration: 122 QT Interval:  313 QTC Calculation: 414 R Axis:   23 Text Interpretation: Sinus tachycardia Borderline prolonged PR interval Consider left atrial enlargement Right bundle branch block Repol abnrm suggests ischemia, diffuse leads TWI in the lateral leads Confirmed by Varney Biles [id number]) on 01/11/2023 7:47:00 PM  Radiology CT Head Wo Contrast  Result Date: 01/11/2023 CLINICAL DATA:  Weakness, altered level of consciousness, dementia EXAM: CT HEAD WITHOUT CONTRAST TECHNIQUE: Contiguous axial images were obtained from the base of the skull through the vertex without intravenous contrast. RADIATION DOSE REDUCTION: This exam was performed according to the departmental dose-optimization program which includes automated exposure control, adjustment of the mA and/or kV according to patient size and/or use of iterative reconstruction technique. COMPARISON:  08/26/2014 FINDINGS: Brain: Hypodensities throughout the periventricular white matter consistent with chronic small vessel ischemic change. No evidence of acute infarct or hemorrhage. Prominence of the lateral ventricles out of proportion to the degree of cerebral atrophy. This could reflect normal pressure hydrocephalus. Remaining midline structures are unremarkable. No acute extra-axial fluid collections. No mass effect. Vascular: No hyperdense vessel or unexpected calcification. Skull: Normal. Negative for fracture or focal lesion. Sinuses/Orbits: No acute finding. Other: None.  IMPRESSION: 1. No acute intracranial process. 2. Prominent lateral ventricles out of proportion to the degree of cerebral atrophy, which could reflect normal pressure hydrocephalus. Electronically Signed   By: Randa Ngo M.D.   On: 01/11/2023 21:09   DG Chest Port 1 View  Result Date: 01/11/2023 CLINICAL DATA:  Sepsis. EXAM: PORTABLE CHEST 1 VIEW COMPARISON:  08/20/2022 x-ray and older FINDINGS: No consolidation, pneumothorax or effusion. No edema. Apical pleural thickening. Tortuous and ectatic aorta. Overlapping cardiac leads. IMPRESSION: No acute cardiopulmonary disease. Electronically Signed   By: Jill Side M.D.   On: 01/11/2023 19:16    Procedures .Critical Care  Performed by: Varney Biles, MD Authorized by: Varney Biles, MD   Critical care provider statement:    Critical care time (minutes):  48   Critical care  was necessary to treat or prevent imminent or life-threatening deterioration of the following conditions:  Sepsis and CNS failure or compromise   Critical care was time spent personally by me on the following activities:  Development of treatment plan with patient or surrogate, discussions with consultants, evaluation of patient's response to treatment, examination of patient, ordering and review of laboratory studies, ordering and review of radiographic studies, ordering and performing treatments and interventions, pulse oximetry, re-evaluation of patient's condition, review of old charts and obtaining history from patient or surrogate     Medications Ordered in ED Medications  lactated ringers infusion ( Intravenous New Bag/Given 01/11/23 1938)  cefTRIAXone (ROCEPHIN) 2 g in sodium chloride 0.9 % 100 mL IVPB (0 g Intravenous Stopped 01/11/23 2022)  sodium chloride 0.9 % bolus 1,000 mL (0 mLs Intravenous Stopped 01/11/23 2042)  acetaminophen (TYLENOL) suppository 650 mg (650 mg Rectal Given 01/11/23 2214)    ED Course/ Medical Decision Making/ A&P                              Medical Decision Making This patient presents to the ED with chief complaint(s) of confusion with pertinent past medical history of dementia.  Patient was found to have a fever.The complaint involves an extensive differential diagnosis and also carries with it a high risk of complications and morbidity.    The differential diagnosis includes: ICH / Stroke, acute coronary syndrome, Infection - UTI/Pneumonia/soft tissue infection leading to encephalopathy, encephalopathy due to electrolyte abnormality or drug interactions or toxins or metabolic conditions like adrenal insufficiency, hyperglycemia, paraneoplastic process, COVID-19, flu, Depakote overdose.  The initial plan is to initiate sepsis workup.  Will give patient IV ceftriaxone given high suspicion for UTI right now I think the encephalopathy is because of sepsis.  IV fluid also ordered.   Additional history obtained: Additional history obtained from family Records reviewed previous admission documents  Independent labs interpretation:  The following labs were independently interpreted: CBC is reassuring, basic metabolic profile is normal.  Patient's LFTs are slightly elevated.  No abdominal pain.  Independent visualization and interpretation of imaging: - I independently visualized the following imaging with scope of interpretation limited to determining acute life threatening conditions related to emergency care: CT scan of the brain, x-ray of the chest, which revealed no evidence of brain bleed, no pneumonia.  Treatment and Reassessment: Patient's urine analysis is still not completed.  Working diagnosis is still UTI. CT scan of the brain is negative for any acute process. Patient is stable for admission at this time.  I requested admitting team to consider MRI if patient does not respond to antibiotics.  But with fevers, known history of recurrent UTI, suspicion high for UTI as a cause.  Amount and/or Complexity of Data  Reviewed Labs: ordered. Radiology: ordered. ECG/medicine tests: ordered.  Risk OTC drugs. Prescription drug management. Decision regarding hospitalization.     Final Clinical Impression(s) / ED Diagnoses Final diagnoses:  Disorientation  Severe sepsis [facility])    Rx / DC Orders ED Discharge Orders     None         Varney Biles, MD 01/11/23 2220

## 2023-01-11 NOTE — Assessment & Plan Note (Signed)
Continue propafenone

## 2023-01-11 NOTE — Assessment & Plan Note (Addendum)
-  secondary to urosepsis with underlying demenia -per husband, at baseline she is alert and oriented x 3 -CT head shows possible normal pressure hydrocephalus. She is being followed by neurology Dr. Carles Collet (last visit in 11/2022) who is considering PSP-FTD complex and had DaT scan ordered.

## 2023-01-11 NOTE — ED Notes (Signed)
In and out cath performed using sterile technique with Quentin Angst RN as second person.

## 2023-01-11 NOTE — Assessment & Plan Note (Signed)
-  Has been controlled for many years -continue clobazam and Depakote-levels are therapeutic

## 2023-01-11 NOTE — ED Triage Notes (Signed)
Pt BIB [facility] with reports of weakness and "talking out of her head." Pt has hx of dementia. Pt febrile at 100.7 en route.

## 2023-01-11 NOTE — ED Notes (Signed)
Unable to obtain covid swab d/t shortage of swabs at this time.

## 2023-01-11 NOTE — Assessment & Plan Note (Signed)
-  per husband, at baseline she is alert and oriented x 3 -completely disoriented today in the setting of sepsis -continue to follow clinically

## 2023-01-11 NOTE — Sepsis Progress Note (Signed)
[facility] monitoring for the code sepsis protocol.  

## 2023-01-11 NOTE — H&P (Signed)
History and Physical    Patient: [name] RKY:[id number] DOB: 12-18-1956 DOA: 01/11/2023 DOS: the patient was seen and examined on 01/11/2023 PCP: Lurline Del, DO  Patient coming from: Home  Chief Complaint:  Chief Complaint  Patient presents with   Weakness   HPI: [name] is a 67 y.o. 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   History obtained from husband over the phone as pt was disoriented. He reports that after her nap today she was more weak, could barely lift her hands to eat. Also had confusion and slurred speech. Husband could barely assist her back to bed so decided to call EMS.  In the ED, noted to be febrile to 101.38F, HR 104, RR23, BP of 111/52.   No leukocytosis, Hgb of 14.5. Lactic within normal limits.   Mild hyponatremia at 132, K of 4.3. creatinine of 1.05. CBG of 131.  AST mildly elevated at 189 and ALT of 86, Alkaline phosphatase and Tbili within normal limits.    CXR negative.  CT head with possible normal pressure hydrocephalus.   Pt started empirically on Rocephin since she has reported hx of recurrent UTI with confusion. UA has not yet been obtained at time of consult. COVID/RSV/Flu PCR also pending.    Review of Systems: unable to review all systems due to the inability of the patient to answer questions. Past Medical History:  Diagnosis Date   Arthritis lower back/hands   Brain injury ([place])    Cancer ([place]) breast tumor removed   Leg fracture    Seizure disorder ([place])    Seizures ([place])    VT (ventricular tachycardia) ([place]) 04/29/2017   Past Surgical History:  Procedure Laterality Date   BREAST SURGERY     FACIAL FRACTURE SURGERY  orbital bone repair   LEFT HEART CATH AND CORONARY ANGIOGRAPHY N/A 04/30/2017   Procedure: Left Heart Cath and Coronary Angiography;  Surgeon: Leonie Man, MD;  Location: [facility];  Service:  Cardiovascular;  Laterality: N/A;   TOTAL HIP ARTHROPLASTY Left 09/07/2019   Procedure: TOTAL HIP ARTHROPLASTY ANTERIOR APPROACH;  Surgeon: Rod Can, MD;  Location: [facility];  Service: Orthopedics;  Laterality: Left;   Social History:  reports that she has never smoked. She has never used smokeless tobacco. She reports that she does not drink alcohol and does not use drugs.  No Known Allergies  Family History  Problem Relation Age of Onset   Heart failure Mother    Heart disease Mother    Pancreatic cancer Father    Cancer Brother     Prior to Admission medications   Medication Sig Start Date End Date Taking? Authorizing Provider  acetaminophen (TYLENOL) 500 MG tablet Take 2 tablets by mouth as needed for pain.    [provider]  CALCIUM PO Take 1 capsule by mouth daily.    [provider]  cloBAZam (ONFI) 10 MG tablet Take 1 tablet (10 mg) in the mornings and one and a half tablets (15 mg) at bedtime by moth daily.    [provider]  divalproex (DEPAKOTE ER) 500 MG 24 hr tablet Take 1,000 mg by mouth at bedtime.     [provider]  lansoprazole (PREVACID) 30 MG capsule Take 30 mg by mouth 2 (two) times daily. 06/10/22   [provider]  Melatonin 10 MG CAPS Take 1 capsule by mouth at bedtime.  [provider]  polyethylene glycol (MIRALAX / GLYCOLAX) packet Take 17 g by mouth daily.    [provider]  propafenone (RYTHMOL SR) 225 MG 12 hr capsule TAKE 1 CAPSULE BY MOUTH TWICE A DAY 05/04/22   Lelon Perla, MD  trimethoprim (TRIMPEX) 100 MG tablet Take 100 mg by mouth daily. 11/30/22   [provider]  VITAMIN D PO Take 1 capsule by mouth daily.    [provider]    Physical Exam: Vitals:   01/11/23 2130 01/11/23 2200 01/11/23 2250 01/11/23 2255  BP: (!) 111/52 (!) 105/58 113/62   Pulse: (!) 103 (!) 104 97   Resp: (!) 23 20 (!) 25   Temp:    (!) 101.6 F (38.7 C)  TempSrc:    Oral   SpO2: 91% 94% (!) 86%    Constitutional: NAD, calm, comfortable, chronically ill-appearing elderly female laying in bed initially asleep.  Awoke easily to voice. Eyes: lids and conjunctivae normal ENMT: Mucous membranes are dry with poor dentition.   Neck: normal, supple Respiratory: clear to auscultation bilaterally, no wheezing, no crackles. Normal respiratory effort. No accessory muscle use. On 2L [place].  Cardiovascular: Regular rate and rhythm, no murmurs / rubs / gallops. No extremity edema.  Abdomen: no tenderness,  Bowel sounds positive.  Musculoskeletal: no clubbing / cyanosis. No joint deformity upper and lower extremities. Good ROM, no contractures. Normal muscle tone.  Skin: no rashes, lesions, ulcers. No induration Neurologic: CN 2-12 grossly intact. Strength 5/5 in all 4.  Psychiatric: Disoriented but normal mood Data Reviewed:  See HPI  Assessment and Plan: * Sepsis secondary to UTI [facility]) -presented with fever, tachycardia, tachypnea and positive UA. 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 -continue IV Rocephin pending urine culture -continuous IV fluid overnight  Acute hypoxic respiratory failure ([place]) -later had desaturation to 87% and placed on 2L via [place] -CXR negative and has no respiratory symptoms -COVID/Flu/RSV swab pending -likely due to sepsis -continue to monitor and wean as tolerated with goal O2 >92%.  Hyponatremia Mild. Continue IV fluids and follow with morning labs.  Dementia without behavioral disturbance ([place]) -per husband, at baseline she is alert and oriented x 3 -completely disoriented today in the setting of sepsis -continue to follow clinically  Acute metabolic encephalopathy -secondary to urosepsis with underlying demenia -per husband, at baseline she is alert and oriented x 3 -CT head shows possible normal pressure hydrocephalus. She is being followed by neurology Dr. Carles Collet (last visit in 11/2022) who is  considering PSP-FTD complex and had DaT scan ordered.   Seizure disorder ([place]) -Has been controlled for many years -continue clobazam and Depakote-levels are therapeutic   History of VT (ventricular tachycardia) (HCC) Continue propafenone      Advance Care Planning:   Code Status: DNR -Verified with husband  Consults: none  Family Communication: husband over the phone  Severity of Illness: The appropriate patient status for this patient is OBSERVATION. Observation status is judged to be reasonable and necessary in order to provide the required intensity of service to ensure the patient's safety. The patient's presenting symptoms, physical exam findings, and initial radiographic and laboratory data in the context of their medical condition is felt to place them at decreased risk for further clinical deterioration. Furthermore, it is anticipated that the patient will be medically stable for discharge from the hospital within 2 midnights of admission.   Author: Orene Desanctis, DO 01/11/2023 11:52 PM  For on  call review [email or web address].

## 2023-01-12 ENCOUNTER — Encounter (HOSPITAL_COMMUNITY): Payer: Self-pay | Admitting: Internal Medicine

## 2023-01-12 DIAGNOSIS — G40909 Epilepsy, unspecified, not intractable, without status epilepticus: Secondary | ICD-10-CM | POA: Diagnosis present

## 2023-01-12 DIAGNOSIS — J1282 Pneumonia due to coronavirus disease 2019: Secondary | ICD-10-CM | POA: Diagnosis present

## 2023-01-12 DIAGNOSIS — Z1611 Resistance to penicillins: Secondary | ICD-10-CM | POA: Diagnosis present

## 2023-01-12 DIAGNOSIS — E876 Hypokalemia: Secondary | ICD-10-CM | POA: Diagnosis present

## 2023-01-12 DIAGNOSIS — R569 Unspecified convulsions: Secondary | ICD-10-CM | POA: Diagnosis not present

## 2023-01-12 DIAGNOSIS — G231 Progressive supranuclear ophthalmoplegia [Steele-Richardson-Olszewski]: Secondary | ICD-10-CM | POA: Diagnosis not present

## 2023-01-12 DIAGNOSIS — F0280 Dementia in other diseases classified elsewhere without behavioral disturbance: Secondary | ICD-10-CM | POA: Diagnosis present

## 2023-01-12 DIAGNOSIS — R6520 Severe sepsis without septic shock: Secondary | ICD-10-CM | POA: Diagnosis present

## 2023-01-12 DIAGNOSIS — R52 Pain, unspecified: Secondary | ICD-10-CM | POA: Diagnosis not present

## 2023-01-12 DIAGNOSIS — K7581 Nonalcoholic steatohepatitis (NASH): Secondary | ICD-10-CM | POA: Diagnosis present

## 2023-01-12 DIAGNOSIS — G3109 Other frontotemporal dementia: Secondary | ICD-10-CM | POA: Diagnosis present

## 2023-01-12 DIAGNOSIS — N390 Urinary tract infection, site not specified: Secondary | ICD-10-CM | POA: Diagnosis present

## 2023-01-12 DIAGNOSIS — R410 Disorientation, unspecified: Secondary | ICD-10-CM | POA: Diagnosis present

## 2023-01-12 DIAGNOSIS — E440 Moderate protein-calorie malnutrition: Secondary | ICD-10-CM | POA: Diagnosis present

## 2023-01-12 DIAGNOSIS — A419 Sepsis, unspecified organism: Secondary | ICD-10-CM | POA: Diagnosis present

## 2023-01-12 DIAGNOSIS — E871 Hypo-osmolality and hyponatremia: Secondary | ICD-10-CM | POA: Diagnosis present

## 2023-01-12 DIAGNOSIS — J690 Pneumonitis due to inhalation of food and vomit: Secondary | ICD-10-CM | POA: Diagnosis not present

## 2023-01-12 DIAGNOSIS — G9389 Other specified disorders of brain: Secondary | ICD-10-CM | POA: Diagnosis present

## 2023-01-12 DIAGNOSIS — G9341 Metabolic encephalopathy: Secondary | ICD-10-CM | POA: Diagnosis present

## 2023-01-12 DIAGNOSIS — U071 COVID-19: Secondary | ICD-10-CM | POA: Diagnosis present

## 2023-01-12 DIAGNOSIS — Z66 Do not resuscitate: Secondary | ICD-10-CM | POA: Diagnosis present

## 2023-01-12 DIAGNOSIS — G259 Extrapyramidal and movement disorder, unspecified: Secondary | ICD-10-CM | POA: Diagnosis present

## 2023-01-12 DIAGNOSIS — N179 Acute kidney failure, unspecified: Secondary | ICD-10-CM | POA: Diagnosis not present

## 2023-01-12 DIAGNOSIS — R4182 Altered mental status, unspecified: Secondary | ICD-10-CM | POA: Diagnosis not present

## 2023-01-12 DIAGNOSIS — Z6830 Body mass index (BMI) 30.0-30.9, adult: Secondary | ICD-10-CM | POA: Diagnosis not present

## 2023-01-12 DIAGNOSIS — J9601 Acute respiratory failure with hypoxia: Secondary | ICD-10-CM | POA: Diagnosis present

## 2023-01-12 DIAGNOSIS — R4701 Aphasia: Secondary | ICD-10-CM | POA: Diagnosis present

## 2023-01-12 DIAGNOSIS — I10 Essential (primary) hypertension: Secondary | ICD-10-CM | POA: Diagnosis present

## 2023-01-12 LAB — BASIC METABOLIC PANEL
Anion gap: 8 (ref 5–15)
BUN: 19 mg/dL (ref 8–23)
CO2: 28 mmol/L (ref 22–32)
Calcium: 8.8 mg/dL — ABNORMAL LOW (ref 8.9–10.3)
Chloride: 98 mmol/L (ref 98–111)
Creatinine, Ser: 0.86 mg/dL (ref 0.44–1.00)
GFR, Estimated: >60 mL/min (ref >60)
Glucose, Bld: 97 mg/dL (ref 70–99)
Potassium: 5 mmol/L (ref 3.5–5.1)
Sodium: 134 mmol/L — ABNORMAL LOW (ref 135–145)

## 2023-01-12 LAB — CBC
HCT: 38.1 % (ref 36.0–46.0)
Hemoglobin: 13 g/dL (ref 12.0–15.0)
MCH: 32.1 pg (ref 26.0–34.0)
MCHC: 34.1 g/dL (ref 30.0–36.0)
MCV: 94.1 fL (ref 80.0–100.0)
Platelets: 93 10*3/uL — ABNORMAL LOW (ref 150–400)
RBC: 4.05 MIL/uL (ref 3.87–5.11)
RDW: 15.3 % (ref 11.5–15.5)
WBC: 6.4 10*3/uL (ref 4.0–10.5)
nRBC: 0 % (ref 0.0–0.2)

## 2023-01-12 MED ORDER — SODIUM CHLORIDE 0.9 % IV SOLN
1.0000 g | INTRAVENOUS | Status: DC
  Administered 2023-01-12 – 2023-01-13 (×2): 1 g via INTRAVENOUS
  Filled 2023-01-12 (×2): qty 10

## 2023-01-12 NOTE — Progress Notes (Signed)
PROGRESS NOTE    [name]  OFB:[id number] DOB: 1956/01/10 DOA: 01/11/2023 PCP: Lurline Del, DO   Brief Narrative:  [name] is a 67 y.o. 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 History obtained from husband over the phone as pt was disoriented. He reports that after her nap today she was more weak, could barely lift her hands to eat. Also had confusion and slurred speech. Husband could barely assist her back to bed so decided to call EMS.  Assessment & Plan:   Principal Problem:   Sepsis secondary to UTI [facility]) Active Problems:   History of VT (ventricular tachycardia) (HCC)   Seizure disorder (HCC)   Acute metabolic encephalopathy   Dementia without behavioral disturbance (HCC)   Hyponatremia   Acute hypoxic respiratory failure (HCC)  Sepsis secondary to UTI ([place]) -presented with fever, tachycardia, tachypnea and positive UA.  -on prophylactic Trimethoprim followed by urology for chronic recurrent UTIs. -continue IV Rocephin pending urine culture -continuous IV fluids until p.o. intake is more appropriate   Acute metabolic encephalopathy on chronic dementia  -Likely exacerbated by above, continue supportive care -Baseline patient is oriented x 3 -Previous CT head showed possible normal pressure hydrocephalus. She is being followed by neurology Dr. Carles Collet (last visit in 11/2022) who is considering PSP-FTD complex and had DaT scan ordered -current symptoms not consistent with NPH.   Acute hypoxic respiratory failure ([place]) -Secondary to above continue nasal cannula, wean as appropriate -CXR negative and has no respiratory symptoms   Hyponatremia, hypovolemic Mild. Continue IV fluids and follow with morning labs.    Seizure disorder ([place]) -Has been controlled for many years -continue clobazam and Depakote-levels are therapeutic    History of VT  (ventricular tachycardia) (HCC) Continue propafenone   DVT prophylaxis: Lovenox Code Status: DNR, confirmed with husband at bedside Family Communication: Husband at bedside  Status is: Inpatient  Dispo: The patient is from: Home              Anticipated d/c is to: To be determined              Anticipated d/c date is: To be determined              Patient currently not medically stable for discharge  Consultants:  None  Procedures:  Stable  Antimicrobials:  Ceftriaxone  Subjective: No acute issues or events overnight review of systems markedly limited given patient's mental status  Objective: Vitals:   01/12/23 0430 01/12/23 0500 01/12/23 0530 01/12/23 0534  BP: 130/73 109/61 (!) 129/94   Pulse: 84 78 84   Resp: (!) 25 (!) 22 (!) 23   Temp:    99 F (37.2 C)  TempSrc:    Axillary  SpO2: 95% 97% 96%     Intake/Output Summary (Last 24 hours) at 01/12/2023 0755 Last data filed at 01/11/2023 2042 Gross per 24 hour  Intake 1100.68 ml  Output --  Net 1100.68 ml   There were no vitals filed for this visit.  Examination:  General exam: Appears calm and comfortable  Respiratory system: Clear to auscultation. Respiratory effort normal. Cardiovascular system: S1 & S2 heard, RRR. No JVD, murmurs, rubs, gallops or clicks. No pedal edema. Gastrointestinal system: Abdomen is nondistended, soft and nontender. No organomegaly or masses felt. Normal bowel sounds heard. Central nervous system: Alert and oriented. No focal neurological deficits. Extremities: Symmetric 5 x 5 power. Skin:  No rashes, lesions or ulcers Psychiatry: Judgement and insight appear normal. Mood & affect appropriate.     Data Reviewed: I have personally reviewed following labs and imaging studies  CBC: Recent Labs  Lab 01/11/23 1853 01/12/23 0425  WBC 5.8 6.4  NEUTROABS 4.7  --   HGB 14.5 13.0  HCT 42.8 38.1  MCV 94.3 94.1  PLT 113* 93*   Basic Metabolic Panel: Recent Labs  Lab  01/11/23 1853 01/12/23 0425  NA 132* 134*  K 4.3 5.0  CL 96* 98  CO2 25 28  GLUCOSE 131* 97  BUN 24* 19  CREATININE 1.05* 0.86  CALCIUM 8.9 8.8*   GFR: CrCl cannot be calculated (Unknown ideal weight.). Liver Function Tests: Recent Labs  Lab 01/11/23 1853  AST 189*  ALT 86*  ALKPHOS 62  BILITOT 0.6  PROT 6.2*  ALBUMIN 2.8*   No results for input(s): "LIPASE", "AMYLASE" in the last 168 hours. No results for input(s): "AMMONIA" in the last 168 hours. Coagulation Profile: Recent Labs  Lab 01/11/23 1853  INR 1.0   Cardiac Enzymes: No results for input(s): "CKTOTAL", "CKMB", "CKMBINDEX", "TROPONINI" in the last 168 hours. BNP (last 3 results) No results for input(s): "PROBNP" in the last 8760 hours. HbA1C: No results for input(s): "HGBA1C" in the last 72 hours. CBG: Recent Labs  Lab 01/11/23 1806  GLUCAP 142*   Lipid Profile: No results for input(s): "CHOL", "HDL", "LDLCALC", "TRIG", "CHOLHDL", "LDLDIRECT" in the last 72 hours. Thyroid Function Tests: No results for input(s): "TSH", "T4TOTAL", "FREET4", "T3FREE", "THYROIDAB" in the last 72 hours. Anemia Panel: No results for input(s): "VITAMINB12", "FOLATE", "FERRITIN", "TIBC", "IRON", "RETICCTPCT" in the last 72 hours. Sepsis Labs: Recent Labs  Lab 01/11/23 1853  LATICACIDVEN 1.8    No results found for this or any previous visit (from the past 240 hour(s)).       Radiology Studies: CT Head Wo Contrast  Result Date: 01/11/2023 CLINICAL DATA:  Weakness, altered level of consciousness, dementia EXAM: CT HEAD WITHOUT CONTRAST TECHNIQUE: Contiguous axial images were obtained from the base of the skull through the vertex without intravenous contrast. RADIATION DOSE REDUCTION: This exam was performed according to the departmental dose-optimization program which includes automated exposure control, adjustment of the mA and/or kV according to patient size and/or use of iterative reconstruction technique.  COMPARISON:  08/26/2014 FINDINGS: Brain: Hypodensities throughout the periventricular white matter consistent with chronic small vessel ischemic change. No evidence of acute infarct or hemorrhage. Prominence of the lateral ventricles out of proportion to the degree of cerebral atrophy. This could reflect normal pressure hydrocephalus. Remaining midline structures are unremarkable. No acute extra-axial fluid collections. No mass effect. Vascular: No hyperdense vessel or unexpected calcification. Skull: Normal. Negative for fracture or focal lesion. Sinuses/Orbits: No acute finding. Other: None. IMPRESSION: 1. No acute intracranial process. 2. Prominent lateral ventricles out of proportion to the degree of cerebral atrophy, which could reflect normal pressure hydrocephalus. Electronically Signed   By: Randa Ngo M.D.   On: 01/11/2023 21:09   DG Chest Port 1 View  Result Date: 01/11/2023 CLINICAL DATA:  Sepsis. EXAM: PORTABLE CHEST 1 VIEW COMPARISON:  08/20/2022 x-ray and older FINDINGS: No consolidation, pneumothorax or effusion. No edema. Apical pleural thickening. Tortuous and ectatic aorta. Overlapping cardiac leads. IMPRESSION: No acute cardiopulmonary disease. Electronically Signed   By: Jill Side M.D.   On: 01/11/2023 19:16    Scheduled Meds:  cloBAZam  10 mg Oral q AM   cloBAZam  15 mg Oral  Once   [START ON 01/13/2023] cloBAZam  15 mg Oral QHS   divalproex  1,000 mg Oral QHS   enoxaparin (LOVENOX) injection  40 mg Subcutaneous Daily   pantoprazole  40 mg Oral Daily   propafenone  225 mg Oral BID   Continuous Infusions:  cefTRIAXone (ROCEPHIN)  IV Stopped (01/11/23 2022)   lactated ringers 150 mL/hr at 01/12/23 0421     LOS: 0 days   Time spent: 71mn  Nyree Yonker C Assad Harbeson, DO [facility]  If 7PM-7AM, please contact night-coverage [email or web address]  01/12/2023, 7:55 AM

## 2023-01-13 ENCOUNTER — Ambulatory Visit (HOSPITAL_COMMUNITY): Payer: Medicare PPO

## 2023-01-13 ENCOUNTER — Ambulatory Visit (HOSPITAL_COMMUNITY): Admission: RE | Admit: 2023-01-13 | Payer: Medicare PPO | Source: Ambulatory Visit

## 2023-01-13 DIAGNOSIS — A419 Sepsis, unspecified organism: Secondary | ICD-10-CM | POA: Diagnosis not present

## 2023-01-13 DIAGNOSIS — N390 Urinary tract infection, site not specified: Secondary | ICD-10-CM | POA: Diagnosis not present

## 2023-01-13 LAB — BASIC METABOLIC PANEL
Anion gap: 11 (ref 5–15)
BUN: 16 mg/dL (ref 8–23)
CO2: 29 mmol/L (ref 22–32)
Calcium: 8.5 mg/dL — ABNORMAL LOW (ref 8.9–10.3)
Chloride: 90 mmol/L — ABNORMAL LOW (ref 98–111)
Creatinine, Ser: 1.09 mg/dL — ABNORMAL HIGH (ref 0.44–1.00)
GFR, Estimated: 56 mL/min — ABNORMAL LOW (ref >60)
Glucose, Bld: 91 mg/dL (ref 70–99)
Potassium: 4 mmol/L (ref 3.5–5.1)
Sodium: 130 mmol/L — ABNORMAL LOW (ref 135–145)

## 2023-01-13 LAB — CBC
HCT: 41.8 % (ref 36.0–46.0)
Hemoglobin: 14.2 g/dL (ref 12.0–15.0)
MCH: 31.5 pg (ref 26.0–34.0)
MCHC: 34 g/dL (ref 30.0–36.0)
MCV: 92.7 fL (ref 80.0–100.0)
Platelets: 88 10*3/uL — ABNORMAL LOW (ref 150–400)
RBC: 4.51 MIL/uL (ref 3.87–5.11)
RDW: 15 % (ref 11.5–15.5)
WBC: 5.8 10*3/uL (ref 4.0–10.5)
nRBC: 0 % (ref 0.0–0.2)

## 2023-01-13 LAB — URINE CULTURE: Culture: 20000 — AB

## 2023-01-13 NOTE — TOC Initial Note (Signed)
Transition of Care [facility]) - Initial/Assessment Note    Patient Details  Name: [name] MRN: [id number] Date of Birth: Nov 22, 1956  Transition of Care [facility]) CM/SW Contact:    Ninfa Meeker, RN Phone Number: 01/13/2023, 12:21 PM  Clinical Narrative:   Transition of Care Screening Note:  Transition of Care [facility]) Department has reviewed patient and no TOC needs have been identified at this time. We will continue to monitor patient advancement through Interdisciplinary progressions and if new patient needs arise, please place a consult.        Patient Goals and CMS Choice            Expected Discharge Plan and Services                                              Prior Living Arrangements/Services                       Activities of Daily Living Home Assistive Devices/Equipment: Environmental consultant (specify type), Wheelchair, Eyeglasses, Raised toilet seat with rails, Other (Comment) (chair lift for car and for stairs) ADL Screening (condition at time of admission) Patient's cognitive ability adequate to safely complete daily activities?: No Is the patient deaf or have difficulty hearing?: No Does the patient have difficulty seeing, even when wearing glasses/contacts?: No Does the patient have difficulty concentrating, remembering, or making decisions?: Yes Patient able to express need for assistance with ADLs?: No Does the patient have difficulty dressing or bathing?: Yes Independently performs ADLs?: No Communication: Independent Dressing (OT): Needs assistance Is this a change from baseline?: Change from baseline, expected to last >3 days Grooming: Needs assistance Is this a change from baseline?: Change from baseline, expected to last >3 days Feeding: Needs assistance Is this a change from baseline?: Change from baseline, expected to last >3 days Bathing: Needs assistance Is this a change from baseline?: Change from baseline, expected to last >3  days Toileting: Needs assistance Is this a change from baseline?: Change from baseline, expected to last >3days In/Out Bed: Needs assistance Is this a change from baseline?: Change from baseline, expected to last >3 days Walks in Home: Needs assistance Is this a change from baseline?: Change from baseline, expected to last >3 days Does the patient have difficulty walking or climbing stairs?: Yes Weakness of Legs: Both Weakness of Arms/Hands: Both  Permission Sought/Granted                  Emotional Assessment              Admission diagnosis:  Disorientation [R41.0] Sepsis secondary to UTI ([place]) [A41.9, N39.0] Sepsis ([place]) [A41.9] Severe sepsis ([place]) [A41.9, R65.20] Patient Active Problem List   Diagnosis Date Noted   Sepsis secondary to UTI ([place]) 01/11/2023   Dementia without behavioral disturbance ([place]) 01/11/2023   Sepsis ([place]) 01/11/2023   Hyponatremia 01/11/2023   Acute hypoxic respiratory failure ([place]) 79/01/4096   Acute metabolic encephalopathy 35/32/9924   AKI (acute kidney injury) ([place]) 08/20/2022   Hip fracture ([place]) 09/06/2019   Seizure disorder ([place])    History of VT (ventricular tachycardia) ([place]) 04/29/2017   Atypical chest pain 04/15/2017   Localization-related (focal) (partial) symptomatic epilepsy and epileptic syndromes with complex partial seizures, not intractable, without status epilepticus ([place]) 02/10/2016   PCP:  Lurline Del, DO Pharmacy:   [id number]  [address] [place], [place] [address] Phone: [phone] Fax: [phone]     Social Determinants of Health (SDOH) Social History: SDOH Screenings   Food Insecurity: No Food Insecurity (01/12/2023)  Housing: Low Risk  (01/12/2023)  Transportation Needs: No Transportation Needs (01/12/2023)  Utilities: Not At Risk (01/12/2023)  Tobacco Use: Low Risk  (01/12/2023)   SDOH Interventions:     Readmission Risk Interventions    08/24/2022    11:18 AM  Readmission Risk Prevention Plan  Post Dischage Appt Complete  Medication Screening Complete  Transportation Screening Complete

## 2023-01-13 NOTE — Progress Notes (Signed)
PROGRESS NOTE    [name]  ZOX:[id number] DOB: 23-Jul-1956 DOA: 01/11/2023 PCP: Lurline Del, DO   Brief Narrative:  [name] is a 67 y.o. 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 History obtained from husband over the phone as pt was disoriented. He reports that after her nap today she was more weak, could barely lift her hands to eat. Also had confusion and slurred speech. Husband could barely assist her back to bed so decided to call EMS.  Assessment & Plan:   Principal Problem:   Sepsis secondary to UTI [facility]) Active Problems:   History of VT (ventricular tachycardia) (HCC)   Seizure disorder (HCC)   Acute metabolic encephalopathy   Dementia without behavioral disturbance (HCC)   Hyponatremia   Acute hypoxic respiratory failure (HCC)  Sepsis secondary to UTI ([place]) -presented with fever, tachycardia, tachypnea and positive UA.  -on prophylactic Trimethoprim followed by urology for chronic recurrent UTIs. -continue IV Rocephin pending urine culture -continuous IV fluids until p.o. intake is more appropriate   Acute metabolic encephalopathy on chronic dementia  -Likely exacerbated by above, continue supportive care -Baseline patient is oriented x 3 -continues to be quite somnolent oriented to person only -Previous CT head showed possible normal pressure hydrocephalus. She is being followed by neurology Dr. Carles Collet (last visit in 11/2022) who is considering PSP-FTD complex and had DaT scan ordered -current symptoms not consistent with NPH.  -If patient remains encephalopathic over the next 24 hours would consider NG tube for definitive route for nutrition and medications (she has been able to take some medications inconsistently over the past 24 hours)  Acute hypoxic respiratory failure ([place]) -Secondary to above continue nasal cannula, wean as appropriate -CXR  negative and has no respiratory symptoms   Hyponatremia, hypovolemic Mild. Continue IV fluids and follow with morning labs.    Seizure disorder ([place]) -Has been controlled for many years -continue clobazam and Depakote-levels are therapeutic    History of VT (ventricular tachycardia) (HCC) Continue propafenone   DVT prophylaxis: Lovenox Code Status: DNR, confirmed with husband at bedside Family Communication: Husband at bedside  Status is: Inpatient  Dispo: The patient is from: Home              Anticipated d/c is to: To be determined              Anticipated d/c date is: To be determined              Patient currently not medically stable for discharge  Consultants:  None  Procedures:  Stable  Antimicrobials:  Ceftriaxone  Subjective: No acute issues or events overnight review of systems markedly limited given patient's mental status  Objective: Vitals:   01/12/23 1934 01/12/23 2320 01/13/23 0351 01/13/23 0500  BP: (!) 118/59 139/69 118/76   Pulse: 85 89 92   Resp: '18 18 18   '$ Temp: 99.1 F (37.3 C) 98.4 F (36.9 C) 98.9 F (37.2 C)   TempSrc: Oral Oral    SpO2: 97% 97% 97%   Weight:    84.4 kg  Height:        Intake/Output Summary (Last 24 hours) at 01/13/2023 0717 Last data filed at 01/13/2023 0000 Gross per 24 hour  Intake --  Output 1900 ml  Net -1900 ml    Filed Weights   01/12/23 1823 01/13/23 0500  Weight: 83.8 kg 84.4 kg  Examination:  General exam: Appears calm and comfortable  Respiratory system: Clear to auscultation. Respiratory effort normal. Cardiovascular system: S1 & S2 heard, RRR. No JVD, murmurs, rubs, gallops or clicks. No pedal edema. Gastrointestinal system: Abdomen is nondistended, soft and nontender. No organomegaly or masses felt. Normal bowel sounds heard. Central nervous system: Alert and oriented. No focal neurological deficits. Extremities: Symmetric 5 x 5 power. Skin: No rashes, lesions or ulcers Psychiatry:  Judgement and insight appear normal. Mood & affect appropriate.     Data Reviewed: I have personally reviewed following labs and imaging studies  CBC: Recent Labs  Lab 01/11/23 1853 01/12/23 0425 01/13/23 0536  WBC 5.8 6.4 5.8  NEUTROABS 4.7  --   --   HGB 14.5 13.0 14.2  HCT 42.8 38.1 41.8  MCV 94.3 94.1 92.7  PLT 113* 93* 88*    Basic Metabolic Panel: Recent Labs  Lab 01/11/23 1853 01/12/23 0425  NA 132* 134*  K 4.3 5.0  CL 96* 98  CO2 25 28  GLUCOSE 131* 97  BUN 24* 19  CREATININE 1.05* 0.86  CALCIUM 8.9 8.8*    GFR: Estimated Creatinine Clearance: 68.1 mL/min (by C-G formula based on SCr of 0.86 mg/dL). Liver Function Tests: Recent Labs  Lab 01/11/23 1853  AST 189*  ALT 86*  ALKPHOS 62  BILITOT 0.6  PROT 6.2*  ALBUMIN 2.8*    No results for input(s): "LIPASE", "AMYLASE" in the last 168 hours. No results for input(s): "AMMONIA" in the last 168 hours. Coagulation Profile: Recent Labs  Lab 01/11/23 1853  INR 1.0    Cardiac Enzymes: No results for input(s): "CKTOTAL", "CKMB", "CKMBINDEX", "TROPONINI" in the last 168 hours. BNP (last 3 results) No results for input(s): "PROBNP" in the last 8760 hours. HbA1C: No results for input(s): "HGBA1C" in the last 72 hours. CBG: Recent Labs  Lab 01/11/23 1806  GLUCAP 142*    Lipid Profile: No results for input(s): "CHOL", "HDL", "LDLCALC", "TRIG", "CHOLHDL", "LDLDIRECT" in the last 72 hours. Thyroid Function Tests: No results for input(s): "TSH", "T4TOTAL", "FREET4", "T3FREE", "THYROIDAB" in the last 72 hours. Anemia Panel: No results for input(s): "VITAMINB12", "FOLATE", "FERRITIN", "TIBC", "IRON", "RETICCTPCT" in the last 72 hours. Sepsis Labs: Recent Labs  Lab 01/11/23 1853  LATICACIDVEN 1.8     Recent Results (from the past 240 hour(s))  Blood Culture (routine x 2)     Status: None (Preliminary result)   Collection Time: 01/11/23  6:36 PM   Specimen: BLOOD RIGHT FOREARM  Result Value  Ref Range Status   Specimen Description BLOOD RIGHT FOREARM  Final   Special Requests   Final    BOTTLES DRAWN AEROBIC AND ANAEROBIC Blood Culture adequate volume   Culture   Final    NO GROWTH < 24 HOURS Performed at [facility], [place] [address] [place], [place] [address]    Report Status PENDING  Incomplete  Urine Culture     Status: Abnormal (Preliminary result)   Collection Time: 01/11/23 10:07 PM   Specimen: In/Out Cath Urine  Result Value Ref Range Status   Specimen Description IN/OUT CATH URINE  Final   Special Requests NONE  Final   Culture (A)  Final    20,000 COLONIES/mL GRAM NEGATIVE RODS 3,000 COLONIES/ML GRAM NEGATIVE RODS IDENTIFICATION AND SUSCEPTIBILITIES TO FOLLOW Performed at [facility], [place] [address], [place], [place] [address]    Report Status PENDING  Incomplete         Radiology Studies: CT  Head Wo Contrast  Result Date: 01/11/2023 CLINICAL DATA:  Weakness, altered level of consciousness, dementia EXAM: CT HEAD WITHOUT CONTRAST TECHNIQUE: Contiguous axial images were obtained from the base of the skull through the vertex without intravenous contrast. RADIATION DOSE REDUCTION: This exam was performed according to the departmental dose-optimization program which includes automated exposure control, adjustment of the mA and/or kV according to patient size and/or use of iterative reconstruction technique. COMPARISON:  08/26/2014 FINDINGS: Brain: Hypodensities throughout the periventricular white matter consistent with chronic small vessel ischemic change. No evidence of acute infarct or hemorrhage. Prominence of the lateral ventricles out of proportion to the degree of cerebral atrophy. This could reflect normal pressure hydrocephalus. Remaining midline structures are unremarkable. No acute extra-axial fluid collections. No mass effect. Vascular: No hyperdense vessel or unexpected calcification. Skull: Normal. Negative for fracture or focal lesion.  Sinuses/Orbits: No acute finding. Other: None. IMPRESSION: 1. No acute intracranial process. 2. Prominent lateral ventricles out of proportion to the degree of cerebral atrophy, which could reflect normal pressure hydrocephalus. Electronically Signed   By: Randa Ngo M.D.   On: 01/11/2023 21:09   DG Chest Port 1 View  Result Date: 01/11/2023 CLINICAL DATA:  Sepsis. EXAM: PORTABLE CHEST 1 VIEW COMPARISON:  08/20/2022 x-ray and older FINDINGS: No consolidation, pneumothorax or effusion. No edema. Apical pleural thickening. Tortuous and ectatic aorta. Overlapping cardiac leads. IMPRESSION: No acute cardiopulmonary disease. Electronically Signed   By: Jill Side M.D.   On: 01/11/2023 19:16    Scheduled Meds:  cloBAZam  10 mg Oral Daily   cloBAZam  15 mg Oral Once   cloBAZam  15 mg Oral QHS   divalproex  1,000 mg Oral QHS   enoxaparin (LOVENOX) injection  40 mg Subcutaneous Daily   pantoprazole  40 mg Oral Daily   propafenone  225 mg Oral BID   Continuous Infusions:  cefTRIAXone (ROCEPHIN)  IV 1 g (01/12/23 1837)     LOS: 1 day   Time spent: 26mn  Jewelianna Pancoast C Kela Baccari, DO [facility]  If 7PM-7AM, please contact night-coverage [email or web address]  01/13/2023, 7:17 AM

## 2023-01-14 DIAGNOSIS — N390 Urinary tract infection, site not specified: Secondary | ICD-10-CM | POA: Diagnosis not present

## 2023-01-14 DIAGNOSIS — A419 Sepsis, unspecified organism: Secondary | ICD-10-CM | POA: Diagnosis not present

## 2023-01-14 LAB — RESP PANEL BY RT-PCR (RSV, FLU A&B, COVID)  RVPGX2
Influenza A by PCR: NEGATIVE
Influenza B by PCR: NEGATIVE
Resp Syncytial Virus by PCR: NEGATIVE
SARS Coronavirus 2 by RT PCR: POSITIVE — AB

## 2023-01-14 MED ORDER — SODIUM CHLORIDE 0.9 % IV BOLUS
250.0000 mL | Freq: Once | INTRAVENOUS | Status: AC
  Administered 2023-01-14: 250 mL via INTRAVENOUS

## 2023-01-14 NOTE — Evaluation (Signed)
Physical Therapy Evaluation Patient Details Name: [name] MRN: [id number] DOB: 1956-04-24 Today's Date: 01/14/2023  History of Present Illness  67 yo female presents to [facility] on 1/15 with weakness, worsening mental status secondary to UTI sepsis. PMH includes frontotemporal dementia,  VT, focal seizures, recurrent UTIs, L THA, L TKA.  Clinical Impression   Pt presents with generalized weakness, poor truncal control with inability to hold head up in sitting EOB, poor to zero sitting balance, max difficulty performing bed-level mobility, and decreased activity tolerance. Pt to benefit from acute PT to address deficits. Pt requiring total assist for to/from EOB at this time, per pt's husband pt is able to stand pivot with husband assist and walk up to 20 ft/day with stand up walker at home at baseline. Pt's husband is hopeful to take pt home, if he does will need equipment listed below. Otherwise PT recommending SNF level of care post-acutely. PT to progress mobility as tolerated, and will continue to follow acutely.         Recommendations for follow up therapy are one component of a multi-disciplinary discharge planning process, led by the attending physician.  Recommendations may be updated based on patient status, additional functional criteria and insurance authorization.  Follow Up Recommendations Skilled nursing-short term rehab (<3 hours/day) Can patient physically be transported by private vehicle: No    Assistance Recommended at Discharge Frequent or constant Supervision/Assistance  Patient can return home with the following  Two people to help with walking and/or transfers;Two people to help with bathing/dressing/bathroom;Assistance with cooking/housework;Assistance with feeding;Direct supervision/assist for medications management;Assist for transportation;Help with stairs or ramp for entrance    Equipment Recommendations Other (comment) (if d/c home, will need hospital bed, hoyer  lift)  Recommendations for Other Services       Functional Status Assessment Patient has had a recent decline in their functional status and/or demonstrates limited ability to make significant improvements in function in a reasonable and predictable amount of time     Precautions / Restrictions Precautions Precautions: Fall Restrictions Weight Bearing Restrictions: No      Mobility  Bed Mobility Overal bed mobility: Needs Assistance Bed Mobility: Supine to Sit, Sit to Supine, Rolling Rolling: Max assist, +2 for physical assistance   Supine to sit: Total assist Sit to supine: Total assist   General bed mobility comments: total assist for all aspects, to/from EOB. max +2 for rolling bilat and boost up in bed    Transfers                   General transfer comment: nt    Ambulation/Gait                  Stairs            Wheelchair Mobility    Modified Rankin (Stroke Patients Only)       Balance Overall balance assessment: Needs assistance Sitting-balance support: Bilateral upper extremity supported, Feet supported Sitting balance-Leahy Scale: Poor Sitting balance - Comments: R lateral bias requiring mod PT truncal assist at all times to correct       Standing balance comment: nt - unable                             Pertinent Vitals/Pain Pain Assessment Pain Assessment: Faces Faces Pain Scale: Hurts a little bit Pain Location: generalized during mobility Pain Descriptors / Indicators: Guarding, Grimacing Pain Intervention(s): Monitored during session, Limited  activity within patient's tolerance, Repositioned    Home Living Family/patient expects to be discharged to:: Private residence Living Arrangements: Spouse/significant other Available Help at Discharge: Family;Available 24 hours/day Type of Home: House Home Access: Stairs to enter Entrance Stairs-Rails: Right;Left Entrance Stairs-Number of Steps: 1 threshold    Home Layout: Multi-level Home Equipment: Conservation officer, nature (2 wheels);Cane - single point;BSC/3in1;Rollator (4 wheels);Wheelchair - manual Additional Comments: stair lift, floor lift, car lift    Prior Function Prior Level of Function : Needs assist             Mobility Comments: mostly w/c level since August/september, walks 20 ft or so with upright walker a day ADLs Comments: pt's husband reports helping pt with ADLs     Hand Dominance   Dominant Hand: Right    Extremity/Trunk Assessment   Upper Extremity Assessment Upper Extremity Assessment: Defer to OT evaluation    Lower Extremity Assessment Lower Extremity Assessment: Generalized weakness    Cervical / Trunk Assessment Cervical / Trunk Assessment: Kyphotic;Other exceptions Cervical / Trunk Exceptions: severe cervical flexion in sitting, heavy R truncal bias  Communication      Cognition Arousal/Alertness: Lethargic Behavior During Therapy: Flat affect Overall Cognitive Status: History of cognitive impairments - at baseline                                 General Comments: history of dementia, answers to her name and yes/no questions. oriented to self-only        General Comments      Exercises     Assessment/Plan    PT Assessment Patient needs continued PT services  PT Problem List Decreased strength;Decreased mobility;Decreased activity tolerance;Decreased balance;Decreased knowledge of use of DME;Pain;Decreased cognition;Decreased coordination;Decreased safety awareness       PT Treatment Interventions DME instruction;Therapeutic activities;Therapeutic exercise;Patient/family education;Functional mobility training;Neuromuscular re-education;Balance training;Gait training    PT Goals (Current goals can be found in the Care Plan section)  Acute Rehab PT Goals Patient Stated Goal: home PT Goal Formulation: With patient/family Time For Goal Achievement: 01/28/23 Potential to Achieve  Goals: Fair    Frequency Min 3X/week     Co-evaluation               AM-PAC PT "6 Clicks" Mobility  Outcome Measure Help needed turning from your back to your side while in a flat bed without using bedrails?: Total Help needed moving from lying on your back to sitting on the side of a flat bed without using bedrails?: Total Help needed moving to and from a bed to a chair (including a wheelchair)?: Total Help needed standing up from a chair using your arms (e.g., wheelchair or bedside chair)?: Total Help needed to walk in hospital room?: Total Help needed climbing 3-5 steps with a railing? : Total 6 Click Score: 6    End of Session Equipment Utilized During Treatment: Oxygen Activity Tolerance: Patient limited by fatigue;Patient limited by lethargy Patient left: in bed;with call bell/phone within reach;with bed alarm set;with family/visitor present Nurse Communication: Mobility status;Other (comment) (NT notified pt of soiled linens, NT to address) PT Visit Diagnosis: Other abnormalities of gait and mobility (R26.89);Muscle weakness (generalized) (M62.81)    Time: 8101-7510 PT Time Calculation (min) (ACUTE ONLY): 20 min   Charges:   PT Evaluation $PT Eval Low Complexity: 1 Low         Ermagene Saidi S, PT DPT Acute Rehabilitation Services Pager [phone]  Office [phone]  Toshio Slusher E Stroup 01/14/2023, 5:10 PM

## 2023-01-14 NOTE — Progress Notes (Signed)
PROGRESS NOTE    [name]  DUK:[id number] DOB: 1956-09-20 DOA: 01/11/2023 PCP: Lurline Del, DO   Brief Narrative:  [name] is a 67 y.o. 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 History obtained from husband over the phone as pt was disoriented. He reports that after her nap today she was more weak, could barely lift her hands to eat. Also had confusion and slurred speech. Husband could barely assist her back to bed so decided to call EMS.  Assessment & Plan:   Principal Problem:   Sepsis secondary to UTI [facility]) Active Problems:   History of VT (ventricular tachycardia) (HCC)   Seizure disorder (HCC)   Acute metabolic encephalopathy   Dementia without behavioral disturbance (HCC)   Hyponatremia   Acute hypoxic respiratory failure (HCC)  Sepsis secondary to UTI ([place]), resolving -presented with fever, tachycardia, tachypnea and positive UA.  -on prophylactic Trimethoprim followed by urology for chronic recurrent UTIs. -continue IV Rocephin pending urine culture -continuous IV fluids until p.o. intake is more appropriate   Incidental/subacute COVID-19, acute infection ruled out Family positive 1 month ago - patient without overt symptoms Uncollected until 1/17 (ordered 1/15) Continue supportive care - without hypoxia, CXR findings or respiratory symptoms No indication for steroids or mab at this juncture. Could be playing a role in mental status changes as above  Acute metabolic encephalopathy on chronic dementia  -Likely exacerbated by above, continue supportive care -Baseline patient is oriented x 3 -continues to be quite somnolent oriented to person only -Previous CT head showed possible normal pressure hydrocephalus. She is being followed by neurology Dr. Carles Collet (last visit in 11/2022) who is considering PSP-FTD complex and had DaT scan ordered -current  symptoms not consistent with NPH.  -Mental status improving treating UTI only - not yet to baseline but improving from prior  Ambulatory dysfunction Acute on chronic -patient essentially wheelchair bound over the past month - previously walking with walker but requiring additional assistance -PT/OT to follow - husband hopes to return home with HHPT if possible  Acute hypoxic respiratory failure ([place]) -Continue nasal cannula, wean as appropriate -CXR negative and has no respiratory symptoms -Likely secondary to altered mental status as above   Hyponatremia, hypovolemic Mild. Continue to advance diet - DC IVF if PO intake improves.    Seizure disorder ([place]) -Has been controlled for many years -continue clobazam and Depakote-levels are therapeutic    History of VT (ventricular tachycardia) (HCC) Continue propafenone  DVT prophylaxis: Lovenox Code Status: DNR, confirmed with husband at bedside Family Communication: Husband at bedside  Status is: Inpatient  Dispo: The patient is from: Home              Anticipated d/c is to: To be determined              Anticipated d/c date is: To be determined              Patient currently not medically stable for discharge  Consultants:  None  Procedures:  Stable  Antimicrobials:  Ceftriaxone  Subjective: No acute issues or events overnight review of systems markedly limited given patient's mental status  Objective: Vitals:   01/13/23 2234 01/14/23 0022 01/14/23 0205 01/14/23 0350  BP: 126/84 (!) 95/56  113/66  Pulse: 91 87  88  Resp: (!) 22 18  (!) 25  Temp: 99.7 F (37.6 C) 100.1 F (37.8 C) 99.6 F (  37.6 C) 100 F (37.8 C)  TempSrc: Oral Oral Axillary Oral  SpO2: 97% 96%  94%  Weight:      Height:        Intake/Output Summary (Last 24 hours) at 01/14/2023 0728 Last data filed at 01/14/2023 0400 Gross per 24 hour  Intake --  Output 450 ml  Net -450 ml    Filed Weights   01/12/23 1823 01/13/23 0500  Weight: 83.8  kg 84.4 kg    Examination:  General exam: Appears calm and comfortable Respiratory system: Clear to auscultation. Respiratory effort normal. Cardiovascular system: S1 & S2 heard, RRR. No JVD, murmurs, rubs, gallops or clicks. No pedal edema. Gastrointestinal system: Abdomen is nondistended, soft and nontender. No organomegaly or masses felt. Normal bowel sounds heard. Central nervous system: Alert and oriented. No focal neurological deficits. Extremities: Symmetric 5 x 5 power. Skin: No rashes, lesions or ulcers Psychiatry: Alert and oriented to person/place/situation  Data Reviewed: I have personally reviewed following labs and imaging studies  CBC: Recent Labs  Lab 01/11/23 1853 01/12/23 0425 01/13/23 0536  WBC 5.8 6.4 5.8  NEUTROABS 4.7  --   --   HGB 14.5 13.0 14.2  HCT 42.8 38.1 41.8  MCV 94.3 94.1 92.7  PLT 113* 93* 88*    Basic Metabolic Panel: Recent Labs  Lab 01/11/23 1853 01/12/23 0425 01/13/23 0536  NA 132* 134* 130*  K 4.3 5.0 4.0  CL 96* 98 90*  CO2 '25 28 29  '$ GLUCOSE 131* 97 91  BUN 24* 19 16  CREATININE 1.05* 0.86 1.09*  CALCIUM 8.9 8.8* 8.5*    GFR: Estimated Creatinine Clearance: 53.8 mL/min (A) (by C-G formula based on SCr of 1.09 mg/dL (H)). Liver Function Tests: Recent Labs  Lab 01/11/23 1853  AST 189*  ALT 86*  ALKPHOS 62  BILITOT 0.6  PROT 6.2*  ALBUMIN 2.8*    No results for input(s): "LIPASE", "AMYLASE" in the last 168 hours. No results for input(s): "AMMONIA" in the last 168 hours. Coagulation Profile: Recent Labs  Lab 01/11/23 1853  INR 1.0    Cardiac Enzymes: No results for input(s): "CKTOTAL", "CKMB", "CKMBINDEX", "TROPONINI" in the last 168 hours. BNP (last 3 results) No results for input(s): "PROBNP" in the last 8760 hours. HbA1C: No results for input(s): "HGBA1C" in the last 72 hours. CBG: Recent Labs  Lab 01/11/23 1806  GLUCAP 142*    Lipid Profile: No results for input(s): "CHOL", "HDL", "LDLCALC",  "TRIG", "CHOLHDL", "LDLDIRECT" in the last 72 hours. Thyroid Function Tests: No results for input(s): "TSH", "T4TOTAL", "FREET4", "T3FREE", "THYROIDAB" in the last 72 hours. Anemia Panel: No results for input(s): "VITAMINB12", "FOLATE", "FERRITIN", "TIBC", "IRON", "RETICCTPCT" in the last 72 hours. Sepsis Labs: Recent Labs  Lab 01/11/23 1853  LATICACIDVEN 1.8     Recent Results (from the past 240 hour(s))  Blood Culture (routine x 2)     Status: None (Preliminary result)   Collection Time: 01/11/23  6:36 PM   Specimen: BLOOD RIGHT FOREARM  Result Value Ref Range Status   Specimen Description BLOOD RIGHT FOREARM  Final   Special Requests   Final    BOTTLES DRAWN AEROBIC AND ANAEROBIC Blood Culture adequate volume   Culture   Final    NO GROWTH 2 DAYS Performed at [facility], [address], [place], [place] [address]    Report Status PENDING  Incomplete  Urine Culture     Status: Abnormal   Collection Time: 01/11/23 10:07 PM  Specimen: In/Out Cath Urine  Result Value Ref Range Status   Specimen Description IN/OUT CATH URINE  Final   Special Requests NONE  Final   Culture (A)  Final    20,000 COLONIES/mL ESCHERICHIA COLI Two isolates with different morphologies were identified as the same organism.The most resistant organism was reported. Performed at [facility], [place] [address], [place], [place] [address]    Report Status 01/13/2023 FINAL  Final   Organism ID, Bacteria ESCHERICHIA COLI (A)  Final      Susceptibility   Escherichia coli - MIC*    AMPICILLIN >=32 RESISTANT Resistant     CEFAZOLIN <=4 SENSITIVE Sensitive     CEFEPIME <=0.12 SENSITIVE Sensitive     CEFTRIAXONE <=0.25 SENSITIVE Sensitive     CIPROFLOXACIN 2 RESISTANT Resistant     GENTAMICIN <=1 SENSITIVE Sensitive     IMIPENEM <=0.25 SENSITIVE Sensitive     NITROFURANTOIN 64 INTERMEDIATE Intermediate     TRIMETH/SULFA >=320 RESISTANT Resistant     AMPICILLIN/SULBACTAM 16 INTERMEDIATE  Intermediate     PIP/TAZO <=4 SENSITIVE Sensitive     * 20,000 COLONIES/mL ESCHERICHIA COLI  Resp panel by RT-PCR (RSV, Flu A&B, Covid) Anterior Nasal Swab     Status: Abnormal   Collection Time: 01/13/23 11:14 PM   Specimen: Anterior Nasal Swab  Result Value Ref Range Status   SARS Coronavirus 2 by RT PCR POSITIVE (A) NEGATIVE Final    Comment: (NOTE) SARS-CoV-2 target nucleic acids are DETECTED.  The SARS-CoV-2 RNA is generally detectable in upper respiratory specimens during the acute phase of infection. Positive results are indicative of the presence of the identified virus, but do not rule out bacterial infection or co-infection with other pathogens not detected by the test. Clinical correlation with patient history and other diagnostic information is necessary to determine patient infection status. The expected result is Negative.  Fact Sheet for Patients: EntrepreneurPulse.com.au  Fact Sheet for Healthcare Providers: IncredibleEmployment.be  This test is not yet approved or cleared by the [place] FDA and  has been authorized for detection and/or diagnosis of SARS-CoV-2 by FDA under an Emergency Use Authorization (EUA).  This EUA will remain in effect (meaning this test can be used) for the duration of  the COVID-19 declaration under Section 564(b)(1) of the A ct, 21 U.S.C. section 360bbb-3(b)(1), unless the authorization is terminated or revoked sooner.     Influenza A by PCR NEGATIVE NEGATIVE Final   Influenza B by PCR NEGATIVE NEGATIVE Final    Comment: (NOTE) The Xpert Xpress SARS-CoV-2/FLU/RSV plus assay is intended as an aid in the diagnosis of influenza from Nasopharyngeal swab specimens and should not be used as a sole basis for treatment. Nasal washings and aspirates are unacceptable for Xpert Xpress SARS-CoV-2/FLU/RSV testing.  Fact Sheet for Patients: EntrepreneurPulse.com.au  Fact Sheet for  Healthcare Providers: IncredibleEmployment.be  This test is not yet approved or cleared by the [place] FDA and has been authorized for detection and/or diagnosis of SARS-CoV-2 by FDA under an Emergency Use Authorization (EUA). This EUA will remain in effect (meaning this test can be used) for the duration of the COVID-19 declaration under Section 564(b)(1) of the Act, 21 U.S.C. section 360bbb-3(b)(1), unless the authorization is terminated or revoked.     Resp Syncytial Virus by PCR NEGATIVE NEGATIVE Final    Comment: (NOTE) Fact Sheet for Patients: EntrepreneurPulse.com.au  Fact Sheet for Healthcare Providers: IncredibleEmployment.be  This test is not yet approved or cleared by the [place] FDA  and has been authorized for detection and/or diagnosis of SARS-CoV-2 by FDA under an Emergency Use Authorization (EUA). This EUA will remain in effect (meaning this test can be used) for the duration of the COVID-19 declaration under Section 564(b)(1) of the Act, 21 U.S.C. section 360bbb-3(b)(1), unless the authorization is terminated or revoked.  Performed at [facility], [place] [address], [place], [place] [address]      Radiology Studies: No results found.  Scheduled Meds:  cloBAZam  10 mg Oral Daily   cloBAZam  15 mg Oral Once   cloBAZam  15 mg Oral QHS   divalproex  1,000 mg Oral QHS   enoxaparin (LOVENOX) injection  40 mg Subcutaneous Daily   pantoprazole  40 mg Oral Daily   propafenone  225 mg Oral BID   Continuous Infusions:  cefTRIAXone (ROCEPHIN)  IV 1 g (01/13/23 1825)     LOS: 2 days   Time spent: 64mn  Phuong Hillary C Lorin Gawron, DO [facility]  If 7PM-7AM, please contact night-coverage [email or web address]  01/14/2023, 7:28 AM

## 2023-01-15 DIAGNOSIS — A419 Sepsis, unspecified organism: Secondary | ICD-10-CM | POA: Diagnosis not present

## 2023-01-15 DIAGNOSIS — N390 Urinary tract infection, site not specified: Secondary | ICD-10-CM | POA: Diagnosis not present

## 2023-01-15 LAB — BASIC METABOLIC PANEL
Anion gap: 8 (ref 5–15)
BUN: 39 mg/dL — ABNORMAL HIGH (ref 8–23)
CO2: 30 mmol/L (ref 22–32)
Calcium: 8.1 mg/dL — ABNORMAL LOW (ref 8.9–10.3)
Chloride: 95 mmol/L — ABNORMAL LOW (ref 98–111)
Creatinine, Ser: 1.14 mg/dL — ABNORMAL HIGH (ref 0.44–1.00)
GFR, Estimated: 53 mL/min — ABNORMAL LOW (ref >60)
Glucose, Bld: 93 mg/dL (ref 70–99)
Potassium: 4.2 mmol/L (ref 3.5–5.1)
Sodium: 133 mmol/L — ABNORMAL LOW (ref 135–145)

## 2023-01-15 LAB — CBC
HCT: 38.9 % (ref 36.0–46.0)
Hemoglobin: 13.4 g/dL (ref 12.0–15.0)
MCH: 32.4 pg (ref 26.0–34.0)
MCHC: 34.4 g/dL (ref 30.0–36.0)
MCV: 94.2 fL (ref 80.0–100.0)
Platelets: 82 10*3/uL — ABNORMAL LOW (ref 150–400)
RBC: 4.13 MIL/uL (ref 3.87–5.11)
RDW: 14.8 % (ref 11.5–15.5)
WBC: 9.5 10*3/uL (ref 4.0–10.5)
nRBC: 0 % (ref 0.0–0.2)

## 2023-01-15 MED ORDER — DM-GUAIFENESIN ER 30-600 MG PO TB12
1.0000 | ORAL_TABLET | Freq: Two times a day (BID) | ORAL | Status: DC | PRN
  Filled 2023-01-15: qty 1

## 2023-01-15 MED ORDER — FLUTICASONE PROPIONATE 50 MCG/ACT NA SUSP: 2.0000 | Freq: Every day | NASAL | Status: DC | PRN

## 2023-01-15 MED ORDER — LACTATED RINGERS IV SOLN: INTRAVENOUS | Status: AC

## 2023-01-15 NOTE — TOC Progression Note (Cosign Needed)
Transition of Care [facility]) - Progression Note    Patient Details  Name: [name] MRN: [id number] Date of Birth: January 31, 1956  Transition of [facility]) CM/SW Contact  Jinger Neighbors, [place] Phone Number: 01/15/2023, 1:12 PM  Clinical Narrative:     [name] KitchenMarland KitchenRE: [name] Date of birth: Feb 19, 1956 Date: 01/15/2023  TO WHOM IT MAY CONCERN:  Please be advised that the above named patient has a primary diagnosis of Dementia, which supercedes any psychiatric diagnosis.    Expected Discharge Plan: Skilled Nursing Facility Barriers to Discharge: No SNF bed  Expected Discharge Plan and Services                                               Social Determinants of Health (SDOH) Interventions SDOH Screenings   Food Insecurity: No Food Insecurity (01/12/2023)  Housing: Low Risk  (01/12/2023)  Transportation Needs: No Transportation Needs (01/12/2023)  Utilities: Not At Risk (01/12/2023)  Tobacco Use: Low Risk  (01/12/2023)    Readmission Risk Interventions    08/24/2022   11:18 AM  Readmission Risk Prevention Plan  Post Dischage Appt Complete  Medication Screening Complete  Transportation Screening Complete

## 2023-01-15 NOTE — TOC Initial Note (Signed)
Transition of Care [facility]) - Initial/Assessment Note    Patient Details  Name: [name] MRN: [id number] Date of Birth: 10-12-1956  Transition of Care [facility]) CM/SW Contact:    Jinger Neighbors, LCSW Phone Number: 01/15/2023, 12:48 PM  Clinical Narrative:                  CSW met with pt's husband outside of the room to complete assessment. Pt's husband is primary care giver in the home and discussed the transitions of DME throughout the progressive stages of FTD. Spouse ambivalent about SNF placement, but would like work up to be completed to see what options are available.   Expected Discharge Plan: Skilled Nursing Facility Barriers to Discharge: No SNF bed   Patient Goals and CMS Choice   CMS Medicare.gov Compare Post Acute Care list provided to:: Patient Represenative (must comment) (Pt's spouse) Choice offered to / list presented to : Spouse      Expected Discharge Plan and Services                                              Prior Living Arrangements/Services   Lives with:: Spouse Patient language and need for interpreter reviewed:: Yes Do you feel safe going back to the place where you live?: Yes      Need for Family Participation in Patient Care: Yes (Comment) Care giver support system in place?: Yes (comment) Current home services: DME Criminal Activity/Legal Involvement Pertinent to Current Situation/Hospitalization: No - Comment as needed  Activities of Daily Living Home Assistive Devices/Equipment: Environmental consultant (specify type), Wheelchair, Eyeglasses, Raised toilet seat with rails, Other (Comment) (chair lift for car and for stairs) ADL Screening (condition at time of admission) Patient's cognitive ability adequate to safely complete daily activities?: No Is the patient deaf or have difficulty hearing?: No Does the patient have difficulty seeing, even when wearing glasses/contacts?: No Does the patient have difficulty concentrating, remembering, or making  decisions?: Yes Patient able to express need for assistance with ADLs?: No Does the patient have difficulty dressing or bathing?: Yes Independently performs ADLs?: No Communication: Independent Dressing (OT): Needs assistance Is this a change from baseline?: Change from baseline, expected to last >3 days Grooming: Needs assistance Is this a change from baseline?: Change from baseline, expected to last >3 days Feeding: Needs assistance Is this a change from baseline?: Change from baseline, expected to last >3 days Bathing: Needs assistance Is this a change from baseline?: Change from baseline, expected to last >3 days Toileting: Needs assistance Is this a change from baseline?: Change from baseline, expected to last >3days In/Out Bed: Needs assistance Is this a change from baseline?: Change from baseline, expected to last >3 days Walks in Home: Needs assistance Is this a change from baseline?: Change from baseline, expected to last >3 days Does the patient have difficulty walking or climbing stairs?: Yes Weakness of Legs: Both Weakness of Arms/Hands: Both  Permission Sought/Granted                  Emotional Assessment   Attitude/Demeanor/Rapport: Unable to Assess Affect (typically observed): Unable to Assess        Admission diagnosis:  Disorientation [R41.0] Sepsis secondary to UTI ([place]) [A41.9, N39.0] Sepsis ([place]) [A41.9] Severe sepsis ([place]) [A41.9, R65.20] Patient Active Problem List   Diagnosis Date Noted   Sepsis secondary to UTI ([place])  01/11/2023   Dementia without behavioral disturbance ([place]) 01/11/2023   Sepsis ([place]) 01/11/2023   Hyponatremia 01/11/2023   Acute hypoxic respiratory failure ([place]) 21/62/4469   Acute metabolic encephalopathy 50/72/2575   AKI (acute kidney injury) ([place]) 08/20/2022   Hip fracture ([place]) 09/06/2019   Seizure disorder ([place])    History of VT (ventricular tachycardia) ([place]) 04/29/2017   Atypical chest pain 04/15/2017    Localization-related (focal) (partial) symptomatic epilepsy and epileptic syndromes with complex partial seizures, not intractable, without status epilepticus ([place]) 02/10/2016   PCP:  Lurline Del, DO Pharmacy:   [id number] [place], [id number]: [phone]: [phone]    Social Determinants of Health ([place] Social History: [place] No Food Insecurity (01/12/2023)  Housing: Low Risk  (01/12/2023)  Transportation Needs: No Transportation Needs (01/12/2023)  Utilities: Not At Risk (01/12/2023)  Tobacco Use: Low Risk  (01/12/2023)   SDOH Interventions:     Readmission Risk Interventions    08/24/2022   11:18 AM  Readmission Risk Prevention Plan  Post Dischage Appt Complete  Medication Screening Complete  Transportation Screening Complete

## 2023-01-15 NOTE — TOC Progression Note (Cosign Needed)
Transition of Care [facility]) - Progression Note    Patient Details  Name: [name] MRN: [id number] Date of Birth: July 21, 1956  Transition of Care [facility]) CM/SW Contact  Jinger Neighbors, [place] Phone Number: 01/15/2023, 1:11 PM  Clinical Narrative:    ..re: [name] Date of birth: June 19, 1956 Date: 01/15/2023  TO WHOM IT MAY CONCERN:  Please be advised that the above named patient will require a short term nursing home stay, anticipated 30 days or less for rehabilitation and strengthening. The plan is for return home.     Expected Discharge Plan: Skilled Nursing Facility Barriers to Discharge: No SNF bed  Expected Discharge Plan and Services                                               Social Determinants of Health (SDOH) Interventions SDOH Screenings   Food Insecurity: No Food Insecurity (01/12/2023)  Housing: Low Risk  (01/12/2023)  Transportation Needs: No Transportation Needs (01/12/2023)  Utilities: Not At Risk (01/12/2023)  Tobacco Use: Low Risk  (01/12/2023)    Readmission Risk Interventions    08/24/2022   11:18 AM  Readmission Risk Prevention Plan  Post Dischage Appt Complete  Medication Screening Complete  Transportation Screening Complete
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
## 2023-01-16 DIAGNOSIS — N390 Urinary tract infection, site not specified: Secondary | ICD-10-CM | POA: Diagnosis not present

## 2023-01-16 DIAGNOSIS — A419 Sepsis, unspecified organism: Secondary | ICD-10-CM | POA: Diagnosis not present

## 2023-01-16 LAB — CBC
HCT: 38.6 % (ref 36.0–46.0)
Hemoglobin: 12.6 g/dL (ref 12.0–15.0)
MCH: 31.5 pg (ref 26.0–34.0)
MCHC: 32.6 g/dL (ref 30.0–36.0)
MCV: 96.5 fL (ref 80.0–100.0)
Platelets: 83 10*3/uL — ABNORMAL LOW (ref 150–400)
RBC: 4 MIL/uL (ref 3.87–5.11)
RDW: 14.6 % (ref 11.5–15.5)
WBC: 6.4 10*3/uL (ref 4.0–10.5)
nRBC: 0 % (ref 0.0–0.2)

## 2023-01-16 LAB — BASIC METABOLIC PANEL
Anion gap: 8 (ref 5–15)
BUN: 30 mg/dL — ABNORMAL HIGH (ref 8–23)
CO2: 33 mmol/L — ABNORMAL HIGH (ref 22–32)
Calcium: 8.3 mg/dL — ABNORMAL LOW (ref 8.9–10.3)
Chloride: 95 mmol/L — ABNORMAL LOW (ref 98–111)
Creatinine, Ser: 0.75 mg/dL (ref 0.44–1.00)
GFR, Estimated: >60 mL/min (ref >60)
Glucose, Bld: 103 mg/dL — ABNORMAL HIGH (ref 70–99)
Potassium: 4.4 mmol/L (ref 3.5–5.1)
Sodium: 136 mmol/L (ref 135–145)

## 2023-01-16 LAB — CULTURE, BLOOD (ROUTINE X 2)
Culture: NO GROWTH
Special Requests: ADEQUATE

## 2023-01-16 MED ORDER — SODIUM CHLORIDE 3 % IN NEBU
4.0000 mL | INHALATION_SOLUTION | Freq: Every day | RESPIRATORY_TRACT | Status: DC
  Administered 2023-01-17 – 2023-01-18 (×2): 4 mL via RESPIRATORY_TRACT
  Filled 2023-01-16 (×3): qty 4

## 2023-01-16 NOTE — Plan of Care (Signed)
  Problem: Education: Goal: Knowledge of General Education information will improve Description: Including pain rating scale, medication(s)/side effects and non-pharmacologic comfort measures Outcome: Progressing
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
## 2023-01-17 ENCOUNTER — Inpatient Hospital Stay (HOSPITAL_COMMUNITY): Payer: Medicare PPO

## 2023-01-17 DIAGNOSIS — U071 COVID-19: Secondary | ICD-10-CM | POA: Diagnosis not present

## 2023-01-17 DIAGNOSIS — R52 Pain, unspecified: Secondary | ICD-10-CM

## 2023-01-17 DIAGNOSIS — A419 Sepsis, unspecified organism: Secondary | ICD-10-CM | POA: Diagnosis not present

## 2023-01-17 DIAGNOSIS — G9349 Other encephalopathy: Secondary | ICD-10-CM

## 2023-01-17 DIAGNOSIS — N390 Urinary tract infection, site not specified: Secondary | ICD-10-CM | POA: Diagnosis not present

## 2023-01-17 DIAGNOSIS — R410 Disorientation, unspecified: Secondary | ICD-10-CM

## 2023-01-17 DIAGNOSIS — R569 Unspecified convulsions: Secondary | ICD-10-CM | POA: Diagnosis not present

## 2023-01-17 DIAGNOSIS — J069 Acute upper respiratory infection, unspecified: Secondary | ICD-10-CM

## 2023-01-17 LAB — HEPATIC FUNCTION PANEL
ALT: 24 U/L (ref 0–44)
AST: 26 U/L (ref 15–41)
Albumin: 2.1 g/dL — ABNORMAL LOW (ref 3.5–5.0)
Alkaline Phosphatase: 45 U/L (ref 38–126)
Bilirubin, Direct: 0.2 mg/dL (ref 0.0–0.2)
Indirect Bilirubin: 0.4 mg/dL (ref 0.3–0.9)
Total Bilirubin: 0.6 mg/dL (ref 0.3–1.2)
Total Protein: 5.6 g/dL — ABNORMAL LOW (ref 6.5–8.1)

## 2023-01-17 LAB — GLUCOSE, CAPILLARY
Glucose-Capillary: 106 mg/dL — ABNORMAL HIGH (ref 70–99)
Glucose-Capillary: 130 mg/dL — ABNORMAL HIGH (ref 70–99)
Glucose-Capillary: 142 mg/dL — ABNORMAL HIGH (ref 70–99)

## 2023-01-17 LAB — C-REACTIVE PROTEIN: CRP: 17.7 mg/dL — ABNORMAL HIGH (ref <1.0)

## 2023-01-17 LAB — D-DIMER, QUANTITATIVE: D-Dimer, Quant: 0.71 ug/mL-FEU — ABNORMAL HIGH (ref 0.00–0.50)

## 2023-01-17 LAB — FERRITIN: Ferritin: 269 ng/mL (ref 11–307)

## 2023-01-17 MED ORDER — ADULT MULTIVITAMIN W/MINERALS CH
1.0000 | ORAL_TABLET | Freq: Every day | ORAL | Status: DC
  Administered 2023-01-17 – 2023-01-22 (×6): 1 via ORAL
  Filled 2023-01-17 (×6): qty 1

## 2023-01-17 MED ORDER — ALBUTEROL SULFATE HFA 108 (90 BASE) MCG/ACT IN AERS
2.0000 | INHALATION_SPRAY | Freq: Four times a day (QID) | RESPIRATORY_TRACT | Status: DC
  Filled 2023-01-17: qty 6.7

## 2023-01-17 MED ORDER — LACTATED RINGERS IV SOLN: INTRAVENOUS | Status: DC

## 2023-01-17 MED ORDER — ACETAMINOPHEN 650 MG RE SUPP
650.0000 mg | Freq: Four times a day (QID) | RECTAL | Status: DC | PRN
  Administered 2023-01-17: 650 mg via RECTAL
  Filled 2023-01-17: qty 1

## 2023-01-17 MED ORDER — SODIUM CHLORIDE 0.9 % IV SOLN
100.0000 mg | Freq: Every day | INTRAVENOUS | Status: AC
  Administered 2023-01-18 – 2023-01-19 (×2): 100 mg via INTRAVENOUS
  Filled 2023-01-17 (×2): qty 20

## 2023-01-17 MED ORDER — IOHEXOL 350 MG/ML SOLN
75.0000 mL | Freq: Once | INTRAVENOUS | Status: AC | PRN
  Administered 2023-01-17: 75 mL via INTRAVENOUS

## 2023-01-17 MED ORDER — SODIUM CHLORIDE 0.9 % IV SOLN
200.0000 mg | Freq: Once | INTRAVENOUS | Status: AC
  Administered 2023-01-17: 200 mg via INTRAVENOUS
  Filled 2023-01-17: qty 40

## 2023-01-17 MED ORDER — OSMOLITE 1.2 CAL PO LIQD: 1000.0000 mL | ORAL | Status: DC

## 2023-01-17 MED ORDER — DEXAMETHASONE 4 MG PO TABS
6.0000 mg | ORAL_TABLET | ORAL | Status: DC
  Administered 2023-01-17: 6 mg via ORAL
  Filled 2023-01-17: qty 1
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
## 2023-01-17 NOTE — Progress Notes (Signed)
Per RN pt is on the way to MRI, check back in 1 hour
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
## 2023-01-17 NOTE — Progress Notes (Signed)
LTM EEG hooked up and running - no initial skin breakdown - push button tested - [facility] monitoring.  

## 2023-01-17 NOTE — Progress Notes (Signed)
Bilateral lower extremity venous duplex has been completed. Preliminary results can be found in CV Proc through chart review.   01/17/23 2:33 PM [name] RVT
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
## 2023-01-18 ENCOUNTER — Inpatient Hospital Stay (HOSPITAL_COMMUNITY): Payer: Medicare PPO

## 2023-01-18 DIAGNOSIS — R410 Disorientation, unspecified: Secondary | ICD-10-CM

## 2023-01-18 DIAGNOSIS — G231 Progressive supranuclear ophthalmoplegia [Steele-Richardson-Olszewski]: Secondary | ICD-10-CM

## 2023-01-18 DIAGNOSIS — R4182 Altered mental status, unspecified: Secondary | ICD-10-CM | POA: Diagnosis not present

## 2023-01-18 DIAGNOSIS — N390 Urinary tract infection, site not specified: Secondary | ICD-10-CM | POA: Diagnosis not present

## 2023-01-18 DIAGNOSIS — A419 Sepsis, unspecified organism: Secondary | ICD-10-CM | POA: Diagnosis not present

## 2023-01-18 DIAGNOSIS — G3109 Other frontotemporal dementia: Secondary | ICD-10-CM

## 2023-01-18 HISTORY — DX: Altered mental status, unspecified: R41.82

## 2023-01-18 HISTORY — DX: Disorientation, unspecified: R41.0

## 2023-01-18 LAB — CBC
HCT: 36.3 % (ref 36.0–46.0)
Hemoglobin: 11.9 g/dL — ABNORMAL LOW (ref 12.0–15.0)
MCH: 31.7 pg (ref 26.0–34.0)
MCHC: 32.8 g/dL (ref 30.0–36.0)
MCV: 96.8 fL (ref 80.0–100.0)
Platelets: 93 10*3/uL — ABNORMAL LOW (ref 150–400)
RBC: 3.75 MIL/uL — ABNORMAL LOW (ref 3.87–5.11)
RDW: 14 % (ref 11.5–15.5)
WBC: 3.6 10*3/uL — ABNORMAL LOW (ref 4.0–10.5)
nRBC: 0 % (ref 0.0–0.2)

## 2023-01-18 LAB — BASIC METABOLIC PANEL
Anion gap: 6 (ref 5–15)
BUN: 34 mg/dL — ABNORMAL HIGH (ref 8–23)
CO2: 38 mmol/L — ABNORMAL HIGH (ref 22–32)
Calcium: 8.5 mg/dL — ABNORMAL LOW (ref 8.9–10.3)
Chloride: 96 mmol/L — ABNORMAL LOW (ref 98–111)
Creatinine, Ser: 0.66 mg/dL (ref 0.44–1.00)
GFR, Estimated: >60 mL/min (ref >60)
Glucose, Bld: 115 mg/dL — ABNORMAL HIGH (ref 70–99)
Potassium: 4.4 mmol/L (ref 3.5–5.1)
Sodium: 140 mmol/L (ref 135–145)

## 2023-01-18 LAB — PROCALCITONIN: Procalcitonin: <0.1 ng/mL

## 2023-01-18 LAB — GLUCOSE, CAPILLARY: Glucose-Capillary: 156 mg/dL — ABNORMAL HIGH (ref 70–99)

## 2023-01-18 LAB — C-REACTIVE PROTEIN: CRP: 18.4 mg/dL — ABNORMAL HIGH (ref <1.0)

## 2023-01-18 LAB — PHOSPHORUS: Phosphorus: 2.7 mg/dL (ref 2.5–4.6)

## 2023-01-18 LAB — BRAIN NATRIURETIC PEPTIDE: B Natriuretic Peptide: 70.7 pg/mL (ref 0.0–100.0)

## 2023-01-18 LAB — MAGNESIUM: Magnesium: 2.3 mg/dL (ref 1.7–2.4)

## 2023-01-18 MED ORDER — SODIUM CHLORIDE 0.9 % IV SOLN
3.0000 g | Freq: Four times a day (QID) | INTRAVENOUS | Status: DC
  Administered 2023-01-18 – 2023-01-22 (×17): 3 g via INTRAVENOUS
  Filled 2023-01-18 (×17): qty 8

## 2023-01-18 MED ORDER — ENSURE ENLIVE PO LIQD
237.0000 mL | Freq: Two times a day (BID) | ORAL | Status: DC
  Administered 2023-01-18 – 2023-01-20 (×4): 237 mL via ORAL

## 2023-01-18 MED ORDER — DEXTROSE IN LACTATED RINGERS 5 % IV SOLN: INTRAVENOUS | Status: DC

## 2023-01-18 MED ORDER — HYDRALAZINE HCL 20 MG/ML IJ SOLN: 10.0000 mg | Freq: Four times a day (QID) | INTRAMUSCULAR | Status: DC | PRN

## 2023-01-18 MED ORDER — METOPROLOL TARTRATE 5 MG/5ML IV SOLN: 5.0000 mg | Freq: Three times a day (TID) | INTRAVENOUS | Status: DC | PRN

## 2023-01-18 MED ORDER — METHYLPREDNISOLONE SODIUM SUCC 125 MG IJ SOLR
60.0000 mg | INTRAMUSCULAR | Status: DC
  Administered 2023-01-18: 60 mg via INTRAVENOUS
  Filled 2023-01-18: qty 2

## 2023-01-18 NOTE — Evaluation (Signed)
Occupational Therapy Evaluation Patient Details Name: [name] MRN: [id number] DOB: 1956-10-30 Today's Date: 01/18/2023   History of Present Illness 67 yo female presents to [facility] on 1/15 with weakness, worsening mental status secondary to UTI sepsis. PMH includes frontotemporal dementia,  VT, focal seizures, recurrent UTIs, L THA, L TKA.   Clinical Impression   [name] was evaluated s/p the above admission list, per her husband she typically is a max A stand pivot to/from surfaces, requires assist for all ADLs, and was abe to walk ~40f with RW 1x/day. Upon evaluation pt was limited by impaired communication, baseline cognition, poor balance with R lateral lean, limited head control in sitting, decreased activity tolerance and weakness. Overall she is total A for bed mobility and ADLs. Once sitting EOB pt initially was max A to correct R lateral lean, but progressed to min A with cues for eyes open. OT to continue to follow acutely. Recommend SNF at this time - pt's husband remains hopeful she can progress to PLOF and return home with his assist.      Recommendations for follow up therapy are one component of a multi-disciplinary discharge planning process, led by the attending physician.  Recommendations may be updated based on patient status, additional functional criteria and insurance authorization.   Follow Up Recommendations  Skilled nursing-short term rehab (<3 hours/day) (Pending acute ptogress - Pt's husband wants to take pt home)     Assistance Recommended at Discharge Frequent or constant Supervision/Assistance  Patient can return home with the following A lot of help with walking and/or transfers;A lot of help with bathing/dressing/bathroom;Two people to help with walking and/or transfers;Two people to help with bathing/dressing/bathroom;Assistance with cooking/housework;Direct supervision/assist for medications management;Assistance with feeding;Direct supervision/assist for  financial management;Assist for transportation;Help with stairs or ramp for entrance    Functional Status Assessment  Patient has had a recent decline in their functional status and/or demonstrates limited ability to make significant improvements in function in a reasonable and predictable amount of time  Equipment Recommendations  Other (comment) (hospital bed, hoyer lift)       Precautions / Restrictions Precautions Precautions: Fall Precaution Comments: COVID+ Restrictions Weight Bearing Restrictions: No      Mobility Bed Mobility Overal bed mobility: Needs Assistance Bed Mobility: Rolling, Sidelying to Sit, Sit to Supine Rolling: Total assist Sidelying to sit: Total assist   Sit to supine: Total assist   General bed mobility comments: rolled L&R for bed pad change, side>sit with total A needed for all    Transfers                   General transfer comment: deferred      Balance Overall balance assessment: Needs assistance Sitting-balance support: Feet supported Sitting balance-Leahy Scale: Poor Sitting balance - Comments: R lateral bias - max A initially, progressed to min A Postural control: Right lateral lean                                 ADL either performed or assessed with clinical judgement   ADL Overall ADL's : Needs assistance/impaired Eating/Feeding: NPO                                     General ADL Comments: total A at bed level fro all aspects of her care     Vision  Vision Assessment?: Vision impaired- to be further tested in functional context Additional Comments: holding eyes closed unless cued otherwise. tracking appropriately     Perception Perception Perception Tested?: No   Praxis Praxis Praxis tested?: Not tested    Pertinent Vitals/Pain Pain Assessment Pain Assessment: Faces Faces Pain Scale: Hurts a little bit Pain Location: generalized with mobility Pain Descriptors / Indicators:  Grimacing Pain Intervention(s): Limited activity within patient's tolerance, Monitored during session     Hand Dominance Right   Extremity/Trunk Assessment Upper Extremity Assessment Upper Extremity Assessment: RUE deficits/detail;Difficult to assess due to impaired cognition;LUE deficits/detail RUE Deficits / Details: limited assessment due to impaired cognition, pt not using BUE functionally for bed mobility or sitting balance. holding in rigid posture, difficult to assess PROM LUE Deficits / Details: limited assessment due to impaired cognition, pt not using BUE functionally for bed mobility or sitting balance. holding in rigid posture, difficult to assess PROM   Lower Extremity Assessment Lower Extremity Assessment: Defer to PT evaluation   Cervical / Trunk Assessment Cervical / Trunk Assessment: Kyphotic;Other exceptions Cervical / Trunk Exceptions: severe cervical flexion in sitting, heavy R truncal bias   Communication Communication Communication: Expressive difficulties   Cognition Arousal/Alertness: Awake/alert Behavior During Therapy: Flat affect Overall Cognitive Status: History of cognitive impairments - at baseline                                 General Comments: Per husband pt normally follows simple commands and responds appropriately with yes/no. Pt was alert but needed cues to hold eyes open, she responded "yes" correctly ~50% of the time     General Comments  VSS on 2L, husband present and supportive            Home Living Family/patient expects to be discharged to:: Private residence Living Arrangements: Spouse/significant other Available Help at Discharge: Family;Available 24 hours/day Type of Home: House Home Access: Stairs to enter [employer] of Steps: 1 threshold Entrance Stairs-Rails: Right;Left Home Layout: Multi-level Alternate Level Stairs-Number of Steps: 5--split level; does have chiar lift down into basement, but pt  has not been able to use it as of late due to her bad left knee prior to sx Alternate Level Stairs-Rails: Right;Left Bathroom Shower/Tub: Occupational psychologist: Handicapped height Bathroom Accessibility: Yes   Home Equipment: Conservation officer, nature (2 wheels);Cane - single point;BSC/3in1;Rollator (4 wheels);Wheelchair - manual   Additional Comments: stair lift, floor lift, car lift      Prior Functioning/Environment Prior Level of Function : Needs assist             Mobility Comments: mostly w/c level since August/september, walks 20 ft or so with upright walker a day ADLs Comments: pt's husband reports helping pt with ADLs        OT Problem List: Decreased strength;Decreased range of motion;Decreased activity tolerance;Impaired balance (sitting and/or standing);Decreased coordination;Decreased cognition;Decreased safety awareness;Decreased knowledge of use of DME or AE;Decreased knowledge of precautions;Impaired UE functional use      OT Treatment/Interventions: Self-care/ADL training;Therapeutic exercise;Neuromuscular education;DME and/or AE instruction;Therapeutic activities;Patient/family education;Balance training    OT Goals(Current goals can be found in the care plan section) Acute Rehab OT Goals Patient Stated Goal: unable to state OT Goal Formulation: With patient/family Time For Goal Achievement: 02/01/23 Potential to Achieve Goals: Fair ADL Goals Pt Will Perform Grooming: with mod assist;sitting Pt Will Transfer to Toilet: with max assist;stand pivot transfer;bedside commode Additional  ADL Goal #1: Pt will complete bed mobility with max A as a precursor to sitting ADLs Additional ADL Goal #2: Pt will maintain sitting balance with supervision A for at least 5 minutes  OT Frequency: Min 2X/week       AM-PAC OT "6 Clicks" Daily Activity     Outcome Measure Help from another person eating meals?: Total Help from another person taking care of personal  grooming?: Total Help from another person toileting, which includes using toliet, bedpan, or urinal?: Total Help from another person bathing (including washing, rinsing, drying)?: Total Help from another person to put on and taking off regular upper body clothing?: Total Help from another person to put on and taking off regular lower body clothing?: Total 6 Click Score: 6   End of Session Equipment Utilized During Treatment: Oxygen Nurse Communication: Mobility status (pt soiled, needs bed change)  Activity Tolerance: Patient tolerated treatment well Patient left: in bed;with call bell/phone within reach;with bed alarm set;with family/visitor present  OT Visit Diagnosis: Unsteadiness on feet (R26.81);Other abnormalities of gait and mobility (R26.89);Muscle weakness (generalized) (M62.81)                Time: 4103-0131 OT Time Calculation (min): 27 min Charges:  OT General Charges $OT Visit: 1 Visit OT Evaluation $OT Eval Moderate Complexity: 1 Mod OT Treatments $Therapeutic Activity: 8-22 mins   Elliot Cousin 01/18/2023, 12:19 PM

## 2023-01-18 NOTE — Progress Notes (Signed)
LTM D/C'd. Patient had no skin break down. [facility] was notified.

## 2023-01-18 NOTE — Progress Notes (Signed)
Pharmacy Antibiotic Note  [name] is a 67 y.o. female admitted on 01/11/2023 with pneumonia.  Pharmacy has been consulted for unasyn dosing.  Plan: Unasyn 3 grams iv q6h  Height: '5\' 5"'$  (165.1 cm) Weight: 84.4 kg (186 lb 1.1 oz) IBW/kg (Calculated) : 57  Temp (24hrs), Avg:98.2 F (36.8 C), Min:97.5 F (36.4 C), Max:99.4 F (37.4 C)  Recent Labs  Lab 01/11/23 1853 01/12/23 0425 01/13/23 0536 01/15/23 0600 01/16/23 0623 01/18/23 0653  WBC 5.8 6.4 5.8 9.5 6.4 3.6*  CREATININE 1.05* 0.86 1.09* 1.14* 0.75 0.66  LATICACIDVEN 1.8  --   --   --   --   --     Estimated Creatinine Clearance: 73.3 mL/min (by C-G formula based on SCr of 0.66 mg/dL).    No Known Allergies  Antimicrobials this admission: CTX 1/15 >> 1/17 Unasyn 1/22 >>    Thank you for allowing pharmacy to be a part of this patient's care.  Vaughan Basta BS, PharmD, BCPS Clinical Pharmacist 01/18/2023 11:12 AM  Contact: [phone] after 3 PM  "Be curious, not judgmental..." -Jamal Maes

## 2023-01-18 NOTE — Progress Notes (Signed)
Physical Therapy Treatment Patient Details Name: [name] MRN: [id number] DOB: 01-25-1956 Today's Date: 01/18/2023   History of Present Illness 67 yo female presents to [facility] on 1/15 with weakness, worsening mental status secondary to UTI sepsis. PMH includes frontotemporal dementia,  VT, focal seizures, recurrent UTIs, L THA, L TKA.    PT Comments    Patient awake and talking! Remains very stiff and weak and continues to require max to total assist to come to sit at EOB. Sitting balance requires max assist initially with progress to heavy min assist due to pt leaning to her right. Pt attempts to correct to midline, but requires assist. Due to poor sitting balance, unable to attempt standing. Continue to recommend SNF at this time, although note that husband wants to be able to take patient home.     Recommendations for follow up therapy are one component of a multi-disciplinary discharge planning process, led by the attending physician.  Recommendations may be updated based on patient status, additional functional criteria and insurance authorization.  Follow Up Recommendations  Skilled nursing-short term rehab (<3 hours/day) (husband wants to take her home) Can patient physically be transported by private vehicle: No   Assistance Recommended at Discharge Frequent or constant Supervision/Assistance  Patient can return home with the following Two people to help with walking and/or transfers;Two people to help with bathing/dressing/bathroom;Assistance with cooking/housework;Assistance with feeding;Direct supervision/assist for medications management;Assist for transportation;Help with stairs or ramp for entrance   Equipment Recommendations  Other (comment);Hospital bed (hoyer lift)    Recommendations for Other Services       Precautions / Restrictions Precautions Precautions: Fall Precaution Comments: COVID+ Restrictions Weight Bearing Restrictions: No     Mobility  Bed  Mobility Overal bed mobility: Needs Assistance Bed Mobility: Rolling, Sidelying to Sit, Sit to Sidelying Rolling: Total assist Sidelying to sit: Max assist     Sit to sidelying: Max assist General bed mobility comments: rolling Rt and lt to fix bed pads under her; up from rt side to sit (per husband, she exits bed on the right side at home) with pt assisting to move legs toward EOB and pushing with UE to help raise torso to sit; return to side with assist to guide torso and to lift legs onto bed    Transfers                   General transfer comment: deferred as pt cannot sit upright without mod assist    Ambulation/Gait                   Stairs             Wheelchair Mobility    Modified Rankin (Stroke Patients Only)       Balance Overall balance assessment: Needs assistance Sitting-balance support: Feet supported Sitting balance-Leahy Scale: Poor Sitting balance - Comments: R lateral bias - max A initially, progressed to min A Postural control: Right lateral lean     Standing balance comment: nt - unable                            Cognition Arousal/Alertness: Awake/alert Behavior During Therapy: WFL for tasks assessed/performed Overall Cognitive Status: Impaired/Different from baseline                                 General Comments: awake and cheerful;  not oriented to place, time, situation; follows commands with significant delay        Exercises General Exercises - Lower Extremity Ankle Circles/Pumps: PROM, AROM, Both, 10 reps Long Arc Quad: AROM, Both, 10 reps, Seated Heel Slides: AAROM, Left, 5 reps, Supine (to try to limber up left leg for bending to help wiht rolling to rt)    General Comments General comments (skin integrity, edema, etc.): Husband present and helpful with multiple lines (pt on EEG, purewick, IV, telemetry). VSS on 2L with +incr coughing upon sitting up      Pertinent Vitals/Pain  Pain Assessment Pain Assessment: No/denies pain    Home Living                          Prior Function            PT Goals (current goals can now be found in the care plan section) Acute Rehab PT Goals Patient Stated Goal: home Time For Goal Achievement: 01/28/23 Potential to Achieve Goals: Fair Progress towards PT goals: Progressing toward goals    Frequency    Min 3X/week      PT Plan Current plan remains appropriate    Co-evaluation              AM-PAC PT "6 Clicks" Mobility   Outcome Measure  Help needed turning from your back to your side while in a flat bed without using bedrails?: Total Help needed moving from lying on your back to sitting on the side of a flat bed without using bedrails?: Total Help needed moving to and from a bed to a chair (including a wheelchair)?: Total Help needed standing up from a chair using your arms (e.g., wheelchair or bedside chair)?: Total Help needed to walk in hospital room?: Total Help needed climbing 3-5 steps with a railing? : Total 6 Click Score: 6    End of Session Equipment Utilized During Treatment: Oxygen Activity Tolerance: Patient limited by fatigue Patient left: in bed;with call bell/phone within reach;with bed alarm set;with family/visitor present   PT Visit Diagnosis: Other abnormalities of gait and mobility (R26.89);Muscle weakness (generalized) (M62.81)     Time: 0454-0981 PT Time Calculation (min) (ACUTE ONLY): 33 min  Charges:  $Therapeutic Activity: 23-37 mins                      Arby Barrette, PT Acute Rehabilitation Services  Office [phone]    Rexanne Mano 01/18/2023, 1:56 PM

## 2023-01-18 NOTE — Procedures (Addendum)
Patient Name: [name]  MRN: [id number]  Epilepsy Attending: Lora Havens  Referring Physician/Provider: Derek Jack, MD  Duration: 01/17/2023 1416 to [phone]  Patient history: 67 year old female with altered mental status.  EEG to evaluate for seizure.  Level of alertness: awake, asleep  AEDs during EEG study: Depakote, Onfi  Technical aspects: This EEG study was done with scalp electrodes positioned according to the 10-20 International system of electrode placement. Electrical activity was reviewed with band pass filter of 1-'70Hz'$ , sensitivity of 7 uV/mm, display speed of 79m/sec with a '60Hz'$  notched filter applied as appropriate. EEG data were recorded continuously and digitally stored.  Video monitoring was available and reviewed as appropriate.  Description: The posterior dominant rhythm consists of 8 Hz activity of moderate voltage (25-35 uV) seen predominantly in posterior head regions, symmetric and reactive to eye opening and eye closing. Sleep was characterized by vertex waves, sleep spindles (12 to 14 Hz), maximal frontocentral region. EEG showed intermittent generalized 3 to 6 Hz theta-delta slowing. Hyperventilation and photic stimulation were not performed.     ABNORMALITY - Intermittent slow, generalized  IMPRESSION: This study is suggestive of mild diffuse encephalopathy, nonspecific etiology. No seizures or epileptiform discharges were seen throughout the recording.  Amaree Leeper OBarbra Sarks
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
## 2023-01-18 NOTE — Progress Notes (Signed)
Initial Nutrition Assessment  DOCUMENTATION CODES:   Not applicable  INTERVENTION:  Continue current diet as recommended by SLP.  Adjust to automatic trays Nursing staff to assist with feeding if family is not present Continue MVI with minerals daily Ensure Enlive po BID, each supplement provides 350 kcal and 20 grams of protein.  NUTRITION DIAGNOSIS:   Increased nutrient needs related to acute illness as evidenced by estimated needs.  GOAL:   Patient will meet greater than or equal to 90% of their needs  MONITOR:   PO intake, Supplement acceptance, Labs, Weight trends, I & O's  REASON FOR ASSESSMENT:   Consult Enteral/tube feeding initiation and management  ASSESSMENT:   Pt with hx of dementia presented to ED after becoming increasingly weak and confused. Husband is caregiver and reports that she is normally oriented x3. Found to be septic likely in the setting og UTI, hypoxic, and positive for COVID19 infection.  No recorded intake this admission and SLP made pt NPO 1/21 due to lethargy.   Pt resting in bed at the time of assessment. Just worked with SLP and was able to be advanced to [employer]. Tray delivered while assessing and family at bedside assisting with feeding. Pt ate entire tray aside from a few bites of broccoli. Pt on cortrak list but discussed with MD and will hold off on placement as it is anticipated that pt will have good PO. Will add ensure to augment intake.   Nutritionally Relevant Medications: Scheduled Meds:  methylPREDNISolone injection  60 mg Intravenous Q24H   multivitamin with minerals  1 tablet Oral Daily   pantoprazole  40 mg Oral Daily   Continuous Infusions:  ampicillin-sulbactam (UNASYN) IV 3 g (01/18/23 0958)   dextrose 5% lactated ringers 100 mL/hr at 01/18/23 0939   feeding supplement (OSMOLITE 1.2 CAL)     Labs Reviewed: Chloride 96 BUN 34 CBG ranges from 106-142 mg/dL over the last 24 hours  NUTRITION - FOCUSED PHYSICAL  EXAM: Flowsheet Row Most Recent Value  Orbital Region No depletion  Upper Arm Region No depletion  Buccal Region No depletion  Temple Region No depletion  Clavicle Bone Region No depletion  Clavicle and Acromion Bone Region No depletion  Scapular Bone Region No depletion  Dorsal Hand No depletion  Patellar Region No depletion  Anterior Thigh Region No depletion  Posterior Calf Region Mild depletion  Edema (RD Assessment) Mild  Hair Reviewed  Eyes Reviewed  Mouth Reviewed  Skin Reviewed  Nails Reviewed   Diet Order:   Diet Order             DIET DYS 3 Room service appropriate? Yes; Fluid consistency: Thin  Diet effective now                   EDUCATION NEEDS:  Education needs have been addressed  Skin:  Skin Assessment: Reviewed RN Assessment  Last BM:  1/22 - type 2  Height:  Ht Readings from Last 1 Encounters:  01/12/23 '5\' 5"'$  (1.651 m)    Weight:  Wt Readings from Last 1 Encounters:  01/13/23 84.4 kg    Ideal Body Weight:  56.8 kg  BMI:  Body mass index is 30.96 kg/m.  Estimated Nutritional Needs:  Kcal:  1600-1800 kcal/d Protein:  75-90 g/d Fluid:  1.6-1.8L/d    Ranell Patrick, RD, LDN Clinical Dietitian RD pager # available in [facility]  After hours/weekend pager # available in [facility]

## 2023-01-19 ENCOUNTER — Inpatient Hospital Stay (HOSPITAL_COMMUNITY): Payer: Medicare PPO

## 2023-01-19 DIAGNOSIS — N390 Urinary tract infection, site not specified: Secondary | ICD-10-CM | POA: Diagnosis not present

## 2023-01-19 DIAGNOSIS — A419 Sepsis, unspecified organism: Secondary | ICD-10-CM | POA: Diagnosis not present

## 2023-01-19 LAB — COMPREHENSIVE METABOLIC PANEL
ALT: 19 U/L (ref 0–44)
AST: 22 U/L (ref 15–41)
Albumin: 1.9 g/dL — ABNORMAL LOW (ref 3.5–5.0)
Alkaline Phosphatase: 42 U/L (ref 38–126)
Anion gap: 6 (ref 5–15)
BUN: 29 mg/dL — ABNORMAL HIGH (ref 8–23)
CO2: 34 mmol/L — ABNORMAL HIGH (ref 22–32)
Calcium: 8.3 mg/dL — ABNORMAL LOW (ref 8.9–10.3)
Chloride: 97 mmol/L — ABNORMAL LOW (ref 98–111)
Creatinine, Ser: 0.52 mg/dL (ref 0.44–1.00)
GFR, Estimated: >60 mL/min (ref >60)
Glucose, Bld: 137 mg/dL — ABNORMAL HIGH (ref 70–99)
Potassium: 3.2 mmol/L — ABNORMAL LOW (ref 3.5–5.1)
Sodium: 137 mmol/L (ref 135–145)
Total Bilirubin: 0.1 mg/dL — ABNORMAL LOW (ref 0.3–1.2)
Total Protein: 5.1 g/dL — ABNORMAL LOW (ref 6.5–8.1)

## 2023-01-19 LAB — C-REACTIVE PROTEIN: CRP: 8.2 mg/dL — ABNORMAL HIGH (ref <1.0)

## 2023-01-19 LAB — CBC WITH DIFFERENTIAL/PLATELET
Abs Immature Granulocytes: 0.14 10*3/uL — ABNORMAL HIGH (ref 0.00–0.07)
Basophils Absolute: 0 10*3/uL (ref 0.0–0.1)
Basophils Relative: 1 %
Eosinophils Absolute: 0 10*3/uL (ref 0.0–0.5)
Eosinophils Relative: 0 %
HCT: 35.9 % — ABNORMAL LOW (ref 36.0–46.0)
Hemoglobin: 12.3 g/dL (ref 12.0–15.0)
Immature Granulocytes: 3 %
Lymphocytes Relative: 28 %
Lymphs Abs: 1.2 10*3/uL (ref 0.7–4.0)
MCH: 32.5 pg (ref 26.0–34.0)
MCHC: 34.3 g/dL (ref 30.0–36.0)
MCV: 95 fL (ref 80.0–100.0)
Monocytes Absolute: 0.5 10*3/uL (ref 0.1–1.0)
Monocytes Relative: 13 %
Neutro Abs: 2.3 10*3/uL (ref 1.7–7.7)
Neutrophils Relative %: 55 %
Platelets: 132 10*3/uL — ABNORMAL LOW (ref 150–400)
RBC: 3.78 MIL/uL — ABNORMAL LOW (ref 3.87–5.11)
RDW: 13.7 % (ref 11.5–15.5)
WBC: 4.2 10*3/uL (ref 4.0–10.5)
nRBC: 0 % (ref 0.0–0.2)

## 2023-01-19 LAB — BRAIN NATRIURETIC PEPTIDE: B Natriuretic Peptide: 56.3 pg/mL (ref 0.0–100.0)

## 2023-01-19 LAB — PROLACTIN: Prolactin: 10.5 ng/mL (ref 3.6–25.2)

## 2023-01-19 LAB — PHOSPHORUS: Phosphorus: 1.7 mg/dL — ABNORMAL LOW (ref 2.5–4.6)

## 2023-01-19 LAB — PROCALCITONIN: Procalcitonin: <0.1 ng/mL

## 2023-01-19 LAB — MAGNESIUM: Magnesium: 1.8 mg/dL (ref 1.7–2.4)

## 2023-01-19 MED ORDER — POTASSIUM CHLORIDE CRYS ER 20 MEQ PO TBCR
20.0000 meq | EXTENDED_RELEASE_TABLET | Freq: Once | ORAL | Status: AC
  Administered 2023-01-19: 20 meq via ORAL
  Filled 2023-01-19: qty 1

## 2023-01-19 MED ORDER — MAGNESIUM SULFATE IN D5W 1-5 GM/100ML-% IV SOLN
1.0000 g | Freq: Once | INTRAVENOUS | Status: AC
  Administered 2023-01-19: 1 g via INTRAVENOUS
  Filled 2023-01-19: qty 100

## 2023-01-19 MED ORDER — POTASSIUM CHLORIDE CRYS ER 20 MEQ PO TBCR
40.0000 meq | EXTENDED_RELEASE_TABLET | Freq: Once | ORAL | Status: AC
  Administered 2023-01-19: 40 meq via ORAL
  Filled 2023-01-19: qty 2

## 2023-01-19 MED ORDER — METHYLPREDNISOLONE SODIUM SUCC 40 MG IJ SOLR
40.0000 mg | Freq: Two times a day (BID) | INTRAMUSCULAR | Status: DC
  Administered 2023-01-19 – 2023-01-22 (×7): 40 mg via INTRAVENOUS
  Filled 2023-01-19 (×8): qty 1

## 2023-01-19 MED ORDER — SODIUM PHOSPHATES 45 MMOLE/15ML IV SOLN
30.0000 mmol | Freq: Once | INTRAVENOUS | Status: AC
  Administered 2023-01-19: 30 mmol via INTRAVENOUS
  Filled 2023-01-19: qty 10
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
## 2023-01-20 ENCOUNTER — Inpatient Hospital Stay (HOSPITAL_COMMUNITY): Payer: Medicare PPO

## 2023-01-20 DIAGNOSIS — A419 Sepsis, unspecified organism: Secondary | ICD-10-CM | POA: Diagnosis not present

## 2023-01-20 DIAGNOSIS — N390 Urinary tract infection, site not specified: Secondary | ICD-10-CM | POA: Diagnosis not present

## 2023-01-20 DIAGNOSIS — R4182 Altered mental status, unspecified: Secondary | ICD-10-CM | POA: Diagnosis not present

## 2023-01-20 DIAGNOSIS — G9341 Metabolic encephalopathy: Secondary | ICD-10-CM | POA: Diagnosis not present

## 2023-01-20 LAB — COMPREHENSIVE METABOLIC PANEL
ALT: 20 U/L (ref 0–44)
AST: 30 U/L (ref 15–41)
Albumin: 2 g/dL — ABNORMAL LOW (ref 3.5–5.0)
Alkaline Phosphatase: 43 U/L (ref 38–126)
Anion gap: 8 (ref 5–15)
BUN: 22 mg/dL (ref 8–23)
CO2: 35 mmol/L — ABNORMAL HIGH (ref 22–32)
Calcium: 7.9 mg/dL — ABNORMAL LOW (ref 8.9–10.3)
Chloride: 96 mmol/L — ABNORMAL LOW (ref 98–111)
Creatinine, Ser: 0.72 mg/dL (ref 0.44–1.00)
GFR, Estimated: >60 mL/min (ref >60)
Glucose, Bld: 97 mg/dL (ref 70–99)
Potassium: 4 mmol/L (ref 3.5–5.1)
Sodium: 139 mmol/L (ref 135–145)
Total Bilirubin: 0.6 mg/dL (ref 0.3–1.2)
Total Protein: 5 g/dL — ABNORMAL LOW (ref 6.5–8.1)

## 2023-01-20 LAB — CBC WITH DIFFERENTIAL/PLATELET
Abs Immature Granulocytes: 0 10*3/uL (ref 0.00–0.07)
Basophils Absolute: 0 10*3/uL (ref 0.0–0.1)
Basophils Relative: 0 %
Eosinophils Absolute: 0 10*3/uL (ref 0.0–0.5)
Eosinophils Relative: 0 %
HCT: 37.2 % (ref 36.0–46.0)
Hemoglobin: 12.2 g/dL (ref 12.0–15.0)
Lymphocytes Relative: 20 %
Lymphs Abs: 0.9 10*3/uL (ref 0.7–4.0)
MCH: 31.3 pg (ref 26.0–34.0)
MCHC: 32.8 g/dL (ref 30.0–36.0)
MCV: 95.4 fL (ref 80.0–100.0)
Monocytes Absolute: 0.1 10*3/uL (ref 0.1–1.0)
Monocytes Relative: 3 %
Neutro Abs: 3.4 10*3/uL (ref 1.7–7.7)
Neutrophils Relative %: 77 %
Platelets: 178 10*3/uL (ref 150–400)
RBC: 3.9 MIL/uL (ref 3.87–5.11)
RDW: 13.9 % (ref 11.5–15.5)
WBC: 4.4 10*3/uL (ref 4.0–10.5)
nRBC: 0 % (ref 0.0–0.2)
nRBC: 2 /100 WBC — ABNORMAL HIGH

## 2023-01-20 LAB — PROCALCITONIN: Procalcitonin: <0.1 ng/mL

## 2023-01-20 LAB — PHOSPHORUS: Phosphorus: 3.1 mg/dL (ref 2.5–4.6)

## 2023-01-20 LAB — C-REACTIVE PROTEIN: CRP: 4.7 mg/dL — ABNORMAL HIGH (ref <1.0)

## 2023-01-20 LAB — MAGNESIUM: Magnesium: 1.9 mg/dL (ref 1.7–2.4)

## 2023-01-20 LAB — BRAIN NATRIURETIC PEPTIDE: B Natriuretic Peptide: 53.8 pg/mL (ref 0.0–100.0)

## 2023-01-20 MED ORDER — K PHOS MONO-SOD PHOS DI & MONO 155-852-130 MG PO TABS
250.0000 mg | ORAL_TABLET | Freq: Two times a day (BID) | ORAL | Status: AC
  Administered 2023-01-20 – 2023-01-21 (×3): 250 mg via ORAL
  Filled 2023-01-20 (×3): qty 1
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
## 2023-01-20 NOTE — Progress Notes (Signed)
Modified Barium Swallow Progress Note  Patient Details  Name: [name] MRN: [id number] Date of Birth: 06/25/1956  Today's Date: 01/20/2023  Modified Barium Swallow completed.  Full report located under Chart Review in the Imaging Section.  Brief recommendations include the following:  Clinical Impression  Patient presents with a mild to moderate oropharyngeal dysphagia and a mild cervical esophageal phase dysphagia as per this MBS. During oral phase, patient exhibited delayed anterior to posterior transit with all tested boluses (thin, nectar thick, puree, mechanical soft)  and decreased mastication with soft solids secondary to decreased lingual, bilabial and mandibular strength.Majority of bolus transisted through oral cavity after swallow initiated. During pharyngeal phase, swallow was initiated at level of vallecular sinus with all boluses (delayed at level of pyriform sinus with one of the sips of thin liquids). Mild amount of vallecular residuals remained after initial swallow with puree solids and mechanical soft solids, trace valecular and pyriform residuals observed after initial swallows of thin liquids and trace vallecular residuals with nectar thick liquids. No penetration or aspiration observed with any of the tested boluses. Vallecular and pyriform sinus residuals cleared with subsequent swallows. During cervical esophageal phase of swallow, SLP observed presence of prominent cricopharyngeal bar and suspected cervical osteophytes (no radiologist present to confirm) which did appear to contribute to slower transit of puree and mechanical soft barium boluses through upper esophagus after passing through UES. In addition, barium stasis observed in upper thoracic portion of esophagus. SLP recommending continue with mechanical soft solids (dys 3) but to advance back to thin liquids. Patient should be sitting upright, be adequately awake and alert with all PO's; alternate sips of liquids  after bites of solids at a relatively slow, steady pace. Eating large bites and/or eating too rapidly will very likely result in buildup of PO's in upper esophagus and in pharynx. SLP will continue to follow for diet toleration and education.   Swallow Evaluation Recommendations       SLP Diet Recommendations: Dysphagia 3 (Mech soft) solids;Thin liquid   Liquid Administration via: Cup;Straw   Medication Administration: Crushed with puree   Supervision: Staff to assist with self feeding;Full supervision/cueing for compensatory strategies;Full assist for feeding   Compensations: Slow rate;Small sips/bites;Follow solids with liquid   Postural Changes: Seated upright at 90 degrees   Oral Care Recommendations: Oral care BID      Sonia Baller, MA, CCC-SLP Speech Therapy

## 2023-01-20 NOTE — Progress Notes (Signed)
Mobility Specialist: Progress Note   01/20/23 1420  Mobility  Activity Transferred from chair to bed  Level of Assistance +2 (takes two people)  Assistive Device MaxiMove  Activity Response Tolerated well  Mobility Referral Yes  $Mobility charge 1 Mobility   Pt received in the chair and assisted back to bed. +2 assist with help from NT, no c/o pain, dizziness, or SOB. Pt is back in bed with call bell and phone at her side. Bed alarm is on.   [place] Saman Giddens Mobility Specialist Please contact via SecureChat or Rehab office at [phone]
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
## 2023-01-20 NOTE — Progress Notes (Signed)
Physical Therapy Treatment Patient Details Name: [name] MRN: [id number] DOB: 1956-04-14 Today's Date: 01/20/2023   History of Present Illness 67 yo female presents to [facility] on 1/15 with weakness, worsening mental status secondary to UTI sepsis. PMH includes frontotemporal dementia,  VT, focal seizures, recurrent UTIs, L THA, L TKA.    PT Comments    Pt greeted supine in bed, sleeping yet easily woken and agreeable to session with continued progress towards goals. Pt continues to need significant assist to complete all mobility secondary to general weakness, poor initiation and poor balance/postural control. Pt able to maintain sitting balance EOB with intermittent mod assist as pt with anterior and R lateral lean, however pt able to correct with multimodal cues. Pt able to progress transfers this date tolerating x3 sist<>stand trials with max-total assist +2. Pt unable to achieve full upright standing despite max cues and manual facilitation. Pt able to squat pivot to recliner with total assist +2 and agreeable to time up in chair at end of session. Pt husband present and encouraging throughout session. Current plan remains appropriate to address deficits and maximize functional independence and decrease caregiver burden. Pt continues to benefit from skilled PT services to progress toward functional mobility goals.     Recommendations for follow up therapy are one component of a multi-disciplinary discharge planning process, led by the attending physician.  Recommendations may be updated based on patient status, additional functional criteria and insurance authorization.  Follow Up Recommendations  Skilled nursing-short term rehab (<3 hours/day) (husband wants to take her home) Can patient physically be transported by private vehicle: No   Assistance Recommended at Discharge Frequent or constant Supervision/Assistance  Patient can return home with the following Two people to help with walking  and/or transfers;Two people to help with bathing/dressing/bathroom;Assistance with cooking/housework;Assistance with feeding;Direct supervision/assist for medications management;Assist for transportation;Help with stairs or ramp for entrance   Equipment Recommendations  Other (comment);Hospital bed (hoyer lift)    Recommendations for Other Services       Precautions / Restrictions Precautions Precautions: Fall Precaution Comments: COVID+ Restrictions Weight Bearing Restrictions: No     Mobility  Bed Mobility Overal bed mobility: Needs Assistance Bed Mobility: Supine to Sit     Supine to sit: Max assist, +2 for physical assistance     General bed mobility comments: max a for all aspects.    Transfers Overall transfer level: Needs assistance Equipment used: Rolling walker (2 wheels), 2 person hand held assist Transfers: Sit to/from Stand, Bed to chair/wheelchair/BSC Sit to Stand: Max assist, Total assist, +2 physical assistance     Squat pivot transfers: Total assist, +2 physical assistance     General transfer comment: max asssit +2 to come to partial stand x3 with RW, bil feet blocked as pt extending knees and feet sliding forward, max cues to tuck hips and elevate trunk with pt unable, total assist +2 to squat pivot to chair    Ambulation/Gait               General Gait Details: unable   Stairs             Wheelchair Mobility    Modified Rankin (Stroke Patients Only)       Balance Overall balance assessment: Needs assistance Sitting-balance support: Feet supported Sitting balance-Leahy Scale: Poor Sitting balance - Comments: R lateral bias - max A initially, progressed to min A Postural control: Right lateral lean Standing balance support: Bilateral upper extremity supported Standing balance-Leahy Scale: Zero  Cognition Arousal/Alertness: Awake/alert Behavior During Therapy: WFL for tasks  assessed/performed Overall Cognitive Status: Impaired/Different from baseline                                 General Comments: sleeping on arrival, able to woken. particapatory        Exercises      General Comments General comments (skin integrity, edema, etc.): husbnad present and supportive thruoghout, VSS on 1L O2      Pertinent Vitals/Pain Pain Assessment Pain Assessment: Faces Faces Pain Scale: Hurts little more Pain Location: generalized with mobility Pain Descriptors / Indicators: Grimacing, Moaning Pain Intervention(s): Monitored during session, Limited activity within patient's tolerance    Home Living                          Prior Function            PT Goals (current goals can now be found in the care plan section) Acute Rehab PT Goals Patient Stated Goal: home PT Goal Formulation: With patient/family Time For Goal Achievement: 01/28/23 Progress towards PT goals: Progressing toward goals    Frequency    Min 3X/week      PT Plan Current plan remains appropriate    Co-evaluation              AM-PAC PT "6 Clicks" Mobility   Outcome Measure  Help needed turning from your back to your side while in a flat bed without using bedrails?: Total Help needed moving from lying on your back to sitting on the side of a flat bed without using bedrails?: Total Help needed moving to and from a bed to a chair (including a wheelchair)?: Total Help needed standing up from a chair using your arms (e.g., wheelchair or bedside chair)?: Total Help needed to walk in hospital room?: Total Help needed climbing 3-5 steps with a railing? : Total 6 Click Score: 6    End of Session Equipment Utilized During Treatment: Oxygen;Gait belt Activity Tolerance: Patient limited by fatigue Patient left: with family/visitor present;in chair;with call bell/phone within reach (with maxi-move pad under pt and bil UE elevated) Nurse Communication:  Mobility status;Need for lift equipment PT Visit Diagnosis: Other abnormalities of gait and mobility (R26.89);Muscle weakness (generalized) (M62.81)     Time: 0263-7858 PT Time Calculation (min) (ACUTE ONLY): 31 min  Charges:  $Therapeutic Activity: 23-37 mins                     Ardie Mclennan R. PTA Acute Rehabilitation Services Office: [facility] 01/20/2023, 12:41 PM

## 2023-01-21 DIAGNOSIS — J9601 Acute respiratory failure with hypoxia: Secondary | ICD-10-CM | POA: Diagnosis not present

## 2023-01-21 DIAGNOSIS — G9341 Metabolic encephalopathy: Secondary | ICD-10-CM | POA: Diagnosis not present

## 2023-01-21 DIAGNOSIS — A419 Sepsis, unspecified organism: Secondary | ICD-10-CM | POA: Diagnosis not present

## 2023-01-21 DIAGNOSIS — N390 Urinary tract infection, site not specified: Secondary | ICD-10-CM | POA: Diagnosis not present

## 2023-01-21 LAB — CBC WITH DIFFERENTIAL/PLATELET
Abs Immature Granulocytes: 0.48 10*3/uL — ABNORMAL HIGH (ref 0.00–0.07)
Basophils Absolute: 0.1 10*3/uL (ref 0.0–0.1)
Basophils Relative: 1 %
Eosinophils Absolute: 0 10*3/uL (ref 0.0–0.5)
Eosinophils Relative: 0 %
HCT: 34.7 % — ABNORMAL LOW (ref 36.0–46.0)
Hemoglobin: 11.8 g/dL — ABNORMAL LOW (ref 12.0–15.0)
Immature Granulocytes: 6 %
Lymphocytes Relative: 15 %
Lymphs Abs: 1.2 10*3/uL (ref 0.7–4.0)
MCH: 31.9 pg (ref 26.0–34.0)
MCHC: 34 g/dL (ref 30.0–36.0)
MCV: 93.8 fL (ref 80.0–100.0)
Monocytes Absolute: 0.6 10*3/uL (ref 0.1–1.0)
Monocytes Relative: 8 %
Neutro Abs: 5.5 10*3/uL (ref 1.7–7.7)
Neutrophils Relative %: 70 %
Platelets: 172 10*3/uL (ref 150–400)
RBC: 3.7 MIL/uL — ABNORMAL LOW (ref 3.87–5.11)
RDW: 13.7 % (ref 11.5–15.5)
Smear Review: NORMAL
WBC: 7.8 10*3/uL (ref 4.0–10.5)
nRBC: 0 % (ref 0.0–0.2)

## 2023-01-21 LAB — COMPREHENSIVE METABOLIC PANEL
ALT: 20 U/L (ref 0–44)
AST: 26 U/L (ref 15–41)
Albumin: 1.9 g/dL — ABNORMAL LOW (ref 3.5–5.0)
Alkaline Phosphatase: 41 U/L (ref 38–126)
Anion gap: 7 (ref 5–15)
BUN: 20 mg/dL (ref 8–23)
CO2: 34 mmol/L — ABNORMAL HIGH (ref 22–32)
Calcium: 7.9 mg/dL — ABNORMAL LOW (ref 8.9–10.3)
Chloride: 97 mmol/L — ABNORMAL LOW (ref 98–111)
Creatinine, Ser: 0.67 mg/dL (ref 0.44–1.00)
GFR, Estimated: >60 mL/min (ref >60)
Glucose, Bld: 110 mg/dL — ABNORMAL HIGH (ref 70–99)
Potassium: 4.1 mmol/L (ref 3.5–5.1)
Sodium: 138 mmol/L (ref 135–145)
Total Bilirubin: 0.6 mg/dL (ref 0.3–1.2)
Total Protein: 5 g/dL — ABNORMAL LOW (ref 6.5–8.1)

## 2023-01-21 LAB — MAGNESIUM: Magnesium: 1.9 mg/dL (ref 1.7–2.4)

## 2023-01-21 LAB — C-REACTIVE PROTEIN: CRP: 2.5 mg/dL — ABNORMAL HIGH (ref <1.0)

## 2023-01-21 LAB — BRAIN NATRIURETIC PEPTIDE: B Natriuretic Peptide: 87.2 pg/mL (ref 0.0–100.0)

## 2023-01-21 LAB — PHOSPHORUS: Phosphorus: 3.4 mg/dL (ref 2.5–4.6)

## 2023-01-21 NOTE — Progress Notes (Signed)
1/25 IMM Letter was given to the nurse to give to the patient. I witnessed nurse doing so.

## 2023-01-21 NOTE — Progress Notes (Signed)
Occupational Therapy Treatment Patient Details Name: [name] MRN: [id number] DOB: 20-Nov-1956 Today's Date: 01/21/2023   History of present illness 67 yo female presents [facility] on 1/15 with weakness, worsening mental status secondary to UTI sepsis. PMH includes frontotemporal dementia,  VT, focal seizures, recurrent UTIs, L THA, L TKA.   OT comments  Pt with incremental progress towards established OT goals. Pt motivated to get to chair. Requiring multimodal cues for safety and sequencing of all mobility. Pt continues to require +2 A for bed mobility and transfers. Pt EOB sititng this session for ~5 minutes prior to transfer. Max A for washing face and scratching nose this session. Pt husband observed to be feeding pt M&Ms on arrival despite not within SLP recommendations for diet. RN notified. Continue to recommend SNF for continued OT services.    Recommendations for follow up therapy are one component of a multi-disciplinary discharge planning process, led by the attending physician.  Recommendations may be updated based on patient status, additional functional criteria and insurance authorization.    Follow Up Recommendations  Skilled nursing-short term rehab (<3 hours/day)     Assistance Recommended at Discharge Frequent or constant Supervision/Assistance  Patient can return home with the following  A lot of help with walking and/or transfers;A lot of help with bathing/dressing/bathroom;Two people to help with walking and/or transfers;Two people to help with bathing/dressing/bathroom;Assistance with cooking/housework;Direct supervision/assist for medications management;Assistance with feeding;Direct supervision/assist for financial management;Assist for transportation;Help with stairs or ramp for entrance   Equipment Recommendations  Other (comment) (hospital bed, hoyer lift)    Recommendations for Other Services      Precautions / Restrictions Precautions Precautions:  Fall Precaution Comments: COVID+ Restrictions Weight Bearing Restrictions:  (Simultaneous filing. User may not have seen previous data.)       Mobility Bed Mobility Overal bed mobility: Needs Assistance Bed Mobility: Rolling, Sidelying to Sit Rolling: Total assist Sidelying to sit: Max assist       General bed mobility comments: max a for all aspects.    Transfers Overall transfer level: Needs assistance Equipment used: 2 person hand held assist Transfers: Sit to/from Stand, Bed to chair/wheelchair/BSC Sit to Stand: Max assist, Total assist, +2 physical assistance Stand pivot transfers: Max assist, +2 physical assistance, Total assist         General transfer comment: max A to come to partial stand 4x during session. Difficulty achieving fully upright position.     Balance Overall balance assessment: Needs assistance Sitting-balance support: Feet supported Sitting balance-Leahy Scale: Poor Sitting balance - Comments: R lateral bias - max A initially, progressed to min A Postural control: Right lateral lean Standing balance support: Bilateral upper extremity supported Standing balance-Leahy Scale: Zero                             ADL either performed or assessed with clinical judgement   ADL Overall ADL's : Needs assistance/impaired   Eating/Feeding Details (indicate cue type and reason): Husband feeding pt peanut M&Ms on arrival despite d3 diet. Redirected, and RN notified Grooming: Wash/dry face;Maximal assistance;Sitting Grooming Details (indicate cue type and reason): Max A to bring hand to face; tightness past ~60 degrees elbow flexion and pt requiring assist for both sitting balance and bringing hand to face.                 Toilet Transfer: Total assistance;+2 for physical assistance;+2 for safety/equipment;Cueing for safety;Stand-pivot;Cueing for sequencing Toilet Transfer  Details (indicate cue type and reason): 2 person hand held assist;  assist for rise and to advance BLE                Extremity/Trunk Assessment Upper Extremity Assessment Upper Extremity Assessment: Generalized weakness RUE Deficits / Details: Inconsistent ability to reach out for therapist hand; unable to reach face without assistance. LUE Deficits / Details: Inconsistent ability to reach out for therapist hand; unable to reach face without assistance.   Lower Extremity Assessment Lower Extremity Assessment: Defer to PT evaluation        Vision   Vision Assessment?: Vision impaired- to be further tested in functional context Additional Comments: making eye contact this session   Perception     Praxis      Cognition Arousal/Alertness: Awake/alert Behavior During Therapy: WFL for tasks assessed/performed Overall Cognitive Status: Impaired/Different from baseline Area of Impairment: Following commands, Attention, Safety/judgement, Problem solving, Memory, Awareness                   Current Attention Level: Focused, Sustained Memory: Decreased short-term memory (Stating "Hey I've seen you before" to mobility specialist 3+ times during session) Following Commands: Follows one step commands inconsistently, Follows one step commands with increased time Safety/Judgement: Decreased awareness of safety, Decreased awareness of deficits Awareness: Intellectual Problem Solving: Slow processing General Comments: participatory. Slow processing trhoughout and fluctuating level of arousal        Exercises Exercises: Other exercises Other Exercises Other Exercises: shoulder AAROM, elbow flexion AAROM in gravity eliminated as pt with compensation at shoulder during attempts. Reaching forward to grasp therapist's hand several times during session, and able to pick up heart monitor box with RUE this sessionn    Shoulder Instructions       General Comments husband present; redirecting him from feeding pt peanut m&ms    Pertinent Vitals/  Pain       Pain Assessment Pain Assessment: Faces Faces Pain Scale: Hurts little more Pain Location: generalized with mobility Pain Descriptors / Indicators: Grimacing, Moaning Pain Intervention(s): Limited activity within patient's tolerance, Monitored during session, Repositioned  Home Living                                          Prior Functioning/Environment              Frequency  Min 2X/week        Progress Toward Goals  OT Goals(current goals can now be found in the care plan section)  Progress towards OT goals: Progressing toward goals (incremental)  Acute Rehab OT Goals Patient Stated Goal: get in chair OT Goal Formulation: With patient/family Time For Goal Achievement: 02/01/23 Potential to Achieve Goals: Fair ADL Goals Pt Will Perform Grooming: with mod assist;sitting Pt Will Transfer to Toilet: with max assist;stand pivot transfer;bedside commode Additional ADL Goal #1: Pt will complete bed mobility with max A as a precursor to sitting ADLs Additional ADL Goal #2: Pt will maintain sitting balance with supervision A for at least 5 minutes  Plan Discharge plan remains appropriate;Frequency remains appropriate    Co-evaluation                 AM-PAC OT "6 Clicks" Daily Activity     Outcome Measure   Help from another person eating meals?: Total Help from another person taking care of personal grooming?: Total Help from another person  toileting, which includes using toliet, bedpan, or urinal?: Total Help from another person bathing (including washing, rinsing, drying)?: Total Help from another person to put on and taking off regular upper body clothing?: Total Help from another person to put on and taking off regular lower body clothing?: Total 6 Click Score: 6    End of Session Equipment Utilized During Treatment: Gait belt;Oxygen  OT Visit Diagnosis: Unsteadiness on feet (R26.81);Other abnormalities of gait and mobility  (R26.89);Muscle weakness (generalized) (M62.81)   Activity Tolerance Patient tolerated treatment well   Patient Left in chair;with call bell/phone within reach;with chair alarm set;with family/visitor present   Nurse Communication Mobility status (lift pad under pt; spouse feeding peanut m&m)        Time: 1335-1416 OT Time Calculation (min): 41 min  Charges: OT General Charges $OT Visit: 1 Visit OT Treatments $Self Care/Home Management : 8-22 mins $Therapeutic Activity: 23-37 mins  Elder Cyphers, OTR/L [facility] Acute Rehabilitation Office: [phone]   Magnus Ivan 01/21/2023, 4:17 PM
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
## 2023-01-21 NOTE — TOC Progression Note (Signed)
Transition of Care [facility]) - Progression Note    Patient Details  Name: [name] MRN: [id number] Date of Birth: [birth year]  Transition of [facility]) CM/SW Contact  Pollie Friar, RN Phone Number: 01/21/2023, 10:31 AM  Clinical Narrative:    Pt on isolation for covid. CM called and spoke to her spouse. CM went over the recommendations from therapy and that pt is requiring 2+ assist for mobility. Spouse states he prefers to take her home. He does need the recommended hospital bed and hoyer lift. No preference on DME company. CM has sent information to [facility] with [employer] and they will deliver the DME to the home.  CM provided the spouse with options for [facility] that are in the [place]. He has selected [facility]. CM will send referral to [facility] with [place] and place information on the AVS. Per MD plan will be for d/c home tomorrow.  TOC following.   Expected Discharge Plan: [place] Barriers to Discharge: Continued Medical Work up  Expected Discharge Plan and Services   Discharge Planning Services: CM Consult Post Acute Care Choice: Home Health, Durable Medical Equipment Living arrangements for the past 2 months: [place]                 DME Arranged: Hospital bed, Other see comment (hoyer lift) DME Agency: [employer] Date DME Agency Contacted: 01/21/23   Representative spoke with at DME Agency: Cyril Mourning HH Arranged: PT, OT           Social Determinants of Health ([place]) Interventions SDOH Screenings   Food Insecurity: No Food Insecurity (01/12/2023)  Housing: Low Risk  (01/12/2023)  Transportation Needs: No Transportation Needs (01/12/2023)  Utilities: Not At Risk (01/12/2023)  Tobacco Use: Low Risk  (01/12/2023)    Readmission Risk Interventions    08/24/2022   11:18 AM  Readmission Risk Prevention Plan  Post Dischage Appt Complete  Medication Screening Complete  Transportation Screening Complete

## 2023-01-21 NOTE — Care Management Important Message (Signed)
Important Message  Patient Details  Name: [name] MRN: [id number] Date of Birth: 08-Oct-1956   Medicare Important Message Given:  Yes     Hannah Beat 01/21/2023, 4:37 PM

## 2023-01-21 NOTE — Progress Notes (Cosign Needed)
Length of Need 6 Months   The above medical condition requires: Patient requires the ability to reposition frequently   Head must be elevated greater than: 30 degrees   Bed type Semi-electric   Hoyer Lift Yes         

## 2023-01-22 ENCOUNTER — Other Ambulatory Visit (HOSPITAL_COMMUNITY): Payer: Self-pay

## 2023-01-22 DIAGNOSIS — G40909 Epilepsy, unspecified, not intractable, without status epilepticus: Secondary | ICD-10-CM | POA: Diagnosis not present

## 2023-01-22 DIAGNOSIS — J9601 Acute respiratory failure with hypoxia: Secondary | ICD-10-CM | POA: Diagnosis not present

## 2023-01-22 DIAGNOSIS — G9341 Metabolic encephalopathy: Secondary | ICD-10-CM | POA: Diagnosis not present

## 2023-01-22 DIAGNOSIS — A419 Sepsis, unspecified organism: Secondary | ICD-10-CM | POA: Diagnosis not present

## 2023-01-22 LAB — CULTURE, BLOOD (ROUTINE X 2)
Culture: NO GROWTH
Culture: NO GROWTH
Special Requests: ADEQUATE

## 2023-01-22 LAB — CBC WITH DIFFERENTIAL/PLATELET
Abs Immature Granulocytes: 0.52 10*3/uL — ABNORMAL HIGH (ref 0.00–0.07)
Basophils Absolute: 0.1 10*3/uL (ref 0.0–0.1)
Basophils Relative: 1 %
Eosinophils Absolute: 0 10*3/uL (ref 0.0–0.5)
Eosinophils Relative: 0 %
HCT: 36.4 % (ref 36.0–46.0)
Hemoglobin: 12.1 g/dL (ref 12.0–15.0)
Immature Granulocytes: 8 %
Lymphocytes Relative: 22 %
Lymphs Abs: 1.5 10*3/uL (ref 0.7–4.0)
MCH: 30.9 pg (ref 26.0–34.0)
MCHC: 33.2 g/dL (ref 30.0–36.0)
MCV: 93.1 fL (ref 80.0–100.0)
Monocytes Absolute: 0.7 10*3/uL (ref 0.1–1.0)
Monocytes Relative: 11 %
Neutro Abs: 3.8 10*3/uL (ref 1.7–7.7)
Neutrophils Relative %: 58 %
Platelets: 210 10*3/uL (ref 150–400)
RBC: 3.91 MIL/uL (ref 3.87–5.11)
RDW: 13.7 % (ref 11.5–15.5)
Smear Review: ADEQUATE
WBC: 6.6 10*3/uL (ref 4.0–10.5)
nRBC: 0.3 % — ABNORMAL HIGH (ref 0.0–0.2)

## 2023-01-22 LAB — MAGNESIUM: Magnesium: 2 mg/dL (ref 1.7–2.4)

## 2023-01-22 LAB — COMPREHENSIVE METABOLIC PANEL
ALT: 20 U/L (ref 0–44)
AST: 27 U/L (ref 15–41)
Albumin: 2 g/dL — ABNORMAL LOW (ref 3.5–5.0)
Alkaline Phosphatase: 46 U/L (ref 38–126)
Anion gap: 6 (ref 5–15)
BUN: 23 mg/dL (ref 8–23)
CO2: 36 mmol/L — ABNORMAL HIGH (ref 22–32)
Calcium: 8.2 mg/dL — ABNORMAL LOW (ref 8.9–10.3)
Chloride: 97 mmol/L — ABNORMAL LOW (ref 98–111)
Creatinine, Ser: 0.7 mg/dL (ref 0.44–1.00)
GFR, Estimated: >60 mL/min (ref >60)
Glucose, Bld: 98 mg/dL (ref 70–99)
Potassium: 3.7 mmol/L (ref 3.5–5.1)
Sodium: 139 mmol/L (ref 135–145)
Total Bilirubin: 0.6 mg/dL (ref 0.3–1.2)
Total Protein: 5.1 g/dL — ABNORMAL LOW (ref 6.5–8.1)

## 2023-01-22 LAB — PHOSPHORUS: Phosphorus: 3.4 mg/dL (ref 2.5–4.6)

## 2023-01-22 LAB — BRAIN NATRIURETIC PEPTIDE: B Natriuretic Peptide: 81 pg/mL (ref 0.0–100.0)

## 2023-01-22 LAB — C-REACTIVE PROTEIN: CRP: 1.7 mg/dL — ABNORMAL HIGH (ref <1.0)

## 2023-01-22 MED ORDER — DEXAMETHASONE 6 MG PO TABS
6.0000 mg | ORAL_TABLET | Freq: Every day | ORAL | 0 refills | Status: DC
  Filled 2023-01-22: qty 5, 5d supply, fill #0

## 2023-01-22 MED ORDER — ACETAMINOPHEN 325 MG PO TABS: 650.0000 mg | ORAL_TABLET | Freq: Four times a day (QID) | ORAL | Status: DC | PRN

## 2023-01-22 NOTE — TOC Transition Note (Signed)
Transition of Care [facility]) - CM/SW Discharge Note   Patient Details  Name: [name] MRN: [id number] Date of Birth: 1956/04/01  Transition of Care [facility]) CM/SW Contact:  Pollie Friar, RN Phone Number: 01/22/2023, 4:18 PM   Clinical Narrative:    Pt discharging home with home health services through [place]. Information on the AVS.  Pt transporting home via [facility]. CM verified home address with spouse. He also verifies that DME has been delivered to the home. Bedside RN aware and d/c packet is at the desk.    Final next level of care: Home w Home Health Services Barriers to Discharge: No Barriers Identified   Patient Goals and CMS Choice CMS Medicare.gov Compare Post Acute Care list provided to:: Patient Represenative (must comment) Choice offered to / list presented to : Spouse  Discharge Placement                         Discharge Plan and Services Additional resources added to the After Visit Summary for     Discharge Planning Services: CM Consult Post Acute Care Choice: Home Health, Durable Medical Equipment          DME Arranged: Hospital bed, Other see comment (hoyer lift) DME Agency: [employer] Date DME Agency Contacted: 01/21/23   Representative spoke with at DME Agency: Cyril Mourning HH Arranged: PT, OT          Social Determinants of Health ([place]) Interventions SDOH Screenings   Food Insecurity: No Food Insecurity (01/12/2023)  Housing: Low Risk  (01/12/2023)  Transportation Needs: No Transportation Needs (01/12/2023)  Utilities: Not At Risk (01/12/2023)  Tobacco Use: Low Risk  (01/12/2023)     Readmission Risk Interventions    08/24/2022   11:18 AM  Readmission Risk Prevention Plan  Post Dischage Appt Complete  Medication Screening Complete  Transportation Screening Complete

## 2023-01-22 NOTE — Plan of Care (Signed)
[name], Husband ( Vianny Schraeder ) and daughter reviewed discharge paper work and education. [name] answered questions regarding the AVS. At this time the pt's husband and daughter have no further questions and stated they understood the discharge paper work and Sport and exercise psychologist.  Problem: Education: Goal: Knowledge of General Education information will improve Description: Including pain rating scale, medication(s)/side effects and non-pharmacologic comfort measures Outcome: Adequate for Discharge   Problem: Health Behavior/Discharge Planning: Goal: Ability to manage health-related needs will improve Outcome: Adequate for Discharge   Problem: Clinical Measurements: Goal: Ability to maintain clinical measurements within normal limits will improve Outcome: Adequate for Discharge Goal: Will remain free from infection Outcome: Adequate for Discharge Goal: Diagnostic test results will improve Outcome: Adequate for Discharge Goal: Respiratory complications will improve Outcome: Adequate for Discharge Goal: Cardiovascular complication will be avoided Outcome: Adequate for Discharge   Problem: Activity: Goal: Risk for activity intolerance will decrease Outcome: Adequate for Discharge   Problem: Nutrition: Goal: Adequate nutrition will be maintained Outcome: Adequate for Discharge   Problem: Coping: Goal: Level of anxiety will decrease Outcome: Adequate for Discharge   Problem: Elimination: Goal: Will not experience complications related to bowel motility Outcome: Adequate for Discharge Goal: Will not experience complications related to urinary retention Outcome: Adequate for Discharge   Problem: Pain Managment: Goal: General experience of comfort will improve Outcome: Adequate for Discharge   Problem: Safety: Goal: Ability to remain free from injury will improve Outcome: Adequate for Discharge   Problem: Skin Integrity: Goal: Risk for impaired skin integrity will decrease Outcome:  Adequate for Discharge   Problem: Education: Goal: Knowledge of risk factors and measures for prevention of condition will improve Outcome: Adequate for Discharge   Problem: Coping: Goal: Psychosocial and spiritual needs will be supported Outcome: Adequate for Discharge   Problem: Respiratory: Goal: Will maintain a patent airway Outcome: Adequate for Discharge Goal: Complications related to the disease process, condition or treatment will be avoided or minimized Outcome: Adequate for Discharge

## 2023-01-22 NOTE — Discharge Instructions (Signed)
Follow with Primary MD Lurline Del, DO in 7 days    Disposition Home    Diet: DYS 3 with thin liquid  On your next visit with your primary care physician please Get Medicines reviewed and adjusted.   Please request your Prim.MD to go over all Hospital Tests and Procedure/Radiological results at the follow up, please get all Hospital records sent to your Prim MD by signing hospital release before you go home.   If you experience worsening of your admission symptoms, develop shortness of breath, life threatening emergency, suicidal or homicidal thoughts you must seek medical attention immediately by calling 911 or calling your MD immediately  if symptoms less severe.  You Must read complete instructions/literature along with all the possible adverse reactions/side effects for all the Medicines you take and that have been prescribed to you. Take any new Medicines after you have completely understood and accpet all the possible adverse reactions/side effects.   Do not drive, operating heavy machinery, perform activities at heights, swimming or participation in water activities or provide baby sitting services if your were admitted for syncope or siezures until you have seen by Primary MD or a Neurologist and advised to do so again.  Do not drive when taking Pain medications.    Do not take more than prescribed Pain, Sleep and Anxiety Medications  Special Instructions: If you have smoked or chewed Tobacco  in the last 2 yrs please stop smoking, stop any regular Alcohol  and or any Recreational drug use.  Wear Seat belts while driving.   Please note  You were cared for by a hospitalist during your hospital stay. If you have any questions about your discharge medications or the care you received while you were in the hospital after you are discharged, you can call the unit and asked to speak with the hospitalist on call if the hospitalist that took care of you is not available. Once you  are discharged, your primary care physician will handle any further medical issues. Please note that NO REFILLS for any discharge medications will be authorized once you are discharged, as it is imperative that you return to your primary care physician (or establish a relationship with a primary care physician if you do not have one) for your aftercare needs so that they can reassess your need for medications and monitor your lab values.

## 2023-01-22 NOTE — Progress Notes (Signed)
Physical Therapy Treatment Patient Details Name: [name] MRN: [id number] DOB: 08-Feb-1956 Today's Date: 01/22/2023   History of Present Illness 67 yo female [facility] with weakness, worsening mental status secondary to UTI sepsis. PMH includes frontotemporal dementia,  VT, focal seizures, recurrent UTIs, L THA, L TKA.    PT Comments    Pt greeted reclined in bed and agreeable to session, however pt continues to be limited by general weakness, lethargy and impaired cognition. Pt able to come to sitting EOB with max assist +2 with pt able to reach with RUE and elevate trunk with HHA. Pt continues to require constant hands on assist in sitting  to maintain balance as pt with anterior trunk flexion and L lateral lean, and unable to correct with multimodal cueing. Pt able to tolerate ~15 mins sitting EOB prior to transfer Pt able to stand pivot transfer with max a +2 and agreeable to time up in chair at end of session. Pt continues to benefit from skilled PT services to progress toward functional mobility goals.   Recommendations for follow up therapy are one component of a multi-disciplinary discharge planning process, led by the attending physician.  Recommendations may be updated based on patient status, additional functional criteria and insurance authorization.  Follow Up Recommendations  Skilled nursing-short term rehab (<3 hours/day) (husband wants to take her home) Can patient physically be transported by private vehicle: No   Assistance Recommended at Discharge Frequent or constant Supervision/Assistance  Patient can return home with the following Two people to help with walking and/or transfers;Two people to help with bathing/dressing/bathroom;Assistance with cooking/housework;Assistance with feeding;Direct supervision/assist for medications management;Assist for transportation;Help with stairs or ramp for entrance   Equipment Recommendations  Other (comment);Hospital  bed (hoyer lift)    Recommendations for Other Services       Precautions / Restrictions Precautions Precautions: Fall Precaution Comments: COVID+     Mobility  Bed Mobility Overal bed mobility: Needs Assistance Bed Mobility: Supine to Sit     Supine to sit: Max assist, +2 for physical assistance     General bed mobility comments: max a for all aspects.    Transfers Overall transfer level: Needs assistance Equipment used: 2 person hand held assist Transfers: Sit to/from Stand, Bed to chair/wheelchair/BSC Sit to Stand: Max assist, Total assist, +2 physical assistance Stand pivot transfers: Max assist, +2 physical assistance, Total assist              Ambulation/Gait               General Gait Details: unable   Stairs             Wheelchair Mobility    Modified Rankin (Stroke Patients Only)       Balance Overall balance assessment: Needs assistance Sitting-balance support: Feet supported Sitting balance-Leahy Scale: Poor Sitting balance - Comments: R lateral bias - max A initially, progressed to min A Postural control: Right lateral lean Standing balance support: Bilateral upper extremity supported Standing balance-Leahy Scale: Zero Standing balance comment: nt - unable                            Cognition Arousal/Alertness: Awake/alert Behavior During Therapy: WFL for tasks assessed/performed Overall Cognitive Status: Impaired/Different from baseline Area of Impairment: Following commands, Attention, Safety/judgement, Problem solving, Memory, Awareness                   Current Attention  Level: Focused, Sustained Memory: Decreased short-term memory (calling MT by wrong name x3 , despite corerction) Following Commands: Follows one step commands inconsistently, Follows one step commands with increased time Safety/Judgement: Decreased awareness of safety, Decreased awareness of deficits Awareness: Intellectual Problem  Solving: Slow processing General Comments: participatory. Slow processing trhoughout and fluctuating level of arousal        Exercises General Exercises - Lower Extremity Long Arc Quad: AROM, Both, 10 reps, Seated Hip Flexion/Marching: AROM, AAROM, Left, Right, 10 reps, Seated Other Exercises Other Exercises: extended sustained cervical/scapular stretch    General Comments General comments (skin integrity, edema, etc.): husband present and supportive      Pertinent Vitals/Pain Pain Assessment Pain Assessment: Faces Faces Pain Scale: Hurts little more Pain Location: generalized with mobility Pain Descriptors / Indicators: Grimacing, Moaning Pain Intervention(s): Monitored during session, Limited activity within patient's tolerance, Repositioned    Home Living                          Prior Function            PT Goals (current goals can now be found in the care plan section) Acute Rehab PT Goals Patient Stated Goal: home PT Goal Formulation: With patient/family Time For Goal Achievement: 01/28/23 Progress towards PT goals: Not progressing toward goals - comment (weakness, lethargy)    Frequency    Min 3X/week      PT Plan      Co-evaluation              AM-PAC PT "6 Clicks" Mobility   Outcome Measure  Help needed turning from your back to your side while in a flat bed without using bedrails?: Total Help needed moving from lying on your back to sitting on the side of a flat bed without using bedrails?: Total Help needed moving to and from a bed to a chair (including a wheelchair)?: Total Help needed standing up from a chair using your arms (e.g., wheelchair or bedside chair)?: Total Help needed to walk in hospital room?: Total Help needed climbing 3-5 steps with a railing? : Total 6 Click Score: 6    End of Session Equipment Utilized During Treatment: Gait belt Activity Tolerance: Patient limited by fatigue Patient left: with  family/visitor present;in chair;with call bell/phone within reach (with maxi-move pad under pt) Nurse Communication: Mobility status;Need for lift equipment PT Visit Diagnosis: Other abnormalities of gait and mobility (R26.89);Muscle weakness (generalized) (M62.81)     Time: 8546-2703 PT Time Calculation (min) (ACUTE ONLY): 34 min  Charges:  $Therapeutic Activity: 23-37 mins                     Shalen Petrak R. PTA Acute Rehabilitation Services Office: [place] 01/22/2023, 12:09 PM

## 2023-01-22 NOTE — Discharge Summary (Signed)
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  Result Date: 01/17/2023 CLINICAL DATA:  Neuro deficit with acute stroke suspected EXAM: MRI HEAD WITHOUT CONTRAST TECHNIQUE: Multiplanar, multiecho pulse sequences of the brain and surrounding structures were obtained without intravenous contrast. COMPARISON:  08/26/2014 FINDINGS: Brain: No acute infarction, hemorrhage, hydrocephalus, extra-axial collection or mass lesion. Generalized atrophy with ventriculomegaly. Cortical encephalomalacia at the anterior superior left temporal lobe and left frontal operculum, question prior trauma given this focal pattern. Chronic small vessel ischemia which is confluent in the periventricular white matter. Vascular: Normal flow voids. Skull and upper cervical spine: Normal marrow signal. Sinuses/Orbits: Negative. IMPRESSION: 1. No acute or reversible finding. 2. Pronounced atrophy which is progressed from 2015. 3.  Confluent chronic small vessel ischemia in the cerebral white matter. 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 Electronically Signed   By: JJorje GuildM.D.   On: 01/17/2023 12:34   DG CHEST PORT 1 VIEW  Result Date: 01/17/2023 CLINICAL DATA:  Hypoxia. EXAM: PORTABLE CHEST 1 VIEW COMPARISON:  01/11/2023 FINDINGS: Patient is rotated to the left. Lungs are hypoinflated with subtle streaky bibasilar density likely atelectasis. No lobar consolidation or effusion. Cardiomediastinal silhouette and remainder of the exam is unchanged. IMPRESSION: Hypoinflation with subtle streaky bibasilar density likely atelectasis. Electronically Signed   By: DMarin OlpM.D.   On: 01/17/2023 09:42   UKoreaAbdomen Limited RUQ (LIVER/GB)  Result Date: 01/17/2023 CLINICAL DATA:  Abdominal pain. EXAM: ULTRASOUND ABDOMEN LIMITED RIGHT UPPER QUADRANT COMPARISON:  None Available. FINDINGS: Gallbladder: No gallstones or wall thickening visualized. No sonographic Murphy sign noted by sonographer. Common bile duct: Diameter: 3.1 mm Liver:  Increased hepatic parenchymal echogenicity. No focal lesion. Portal vein is patent on color Doppler imaging with normal direction of blood flow towards the liver. Other: None. IMPRESSION: 1. No cholelithiasis or sonographic evidence for acute cholecystitis. 2. Increased hepatic parenchymal echogenicity suggestive of steatosis. Electronically Signed   By: Lovey Newcomer M.D.   On: 01/17/2023 09:41   CT Head Wo Contrast  Result Date: 01/11/2023 CLINICAL DATA:  Weakness, altered level of consciousness, dementia EXAM: CT HEAD WITHOUT CONTRAST TECHNIQUE: Contiguous axial images were obtained from the base of the skull through the vertex without intravenous contrast. RADIATION DOSE REDUCTION: This exam was performed according to the departmental dose-optimization program which includes automated exposure control, adjustment of the mA and/or kV according to patient size and/or use  of iterative reconstruction technique. COMPARISON:  08/26/2014 FINDINGS: Brain: Hypodensities throughout the periventricular white matter consistent with chronic small vessel ischemic change. No evidence of acute infarct or hemorrhage. Prominence of the lateral ventricles out of proportion to the degree of cerebral atrophy. This could reflect normal pressure hydrocephalus. Remaining midline structures are unremarkable. No acute extra-axial fluid collections. No mass effect. Vascular: No hyperdense vessel or unexpected calcification. Skull: Normal. Negative for fracture or focal lesion. Sinuses/Orbits: No acute finding. Other: None. IMPRESSION: 1. No acute intracranial process. 2. Prominent lateral ventricles out of proportion to the degree of cerebral atrophy, which could reflect normal pressure hydrocephalus. Electronically Signed   By: Randa Ngo M.D.   On: 01/11/2023 21:09   DG Chest Port 1 View  Result Date: 01/11/2023 CLINICAL DATA:  Sepsis. EXAM: PORTABLE CHEST 1 VIEW COMPARISON:  08/20/2022 x-ray and older FINDINGS: No consolidation, pneumothorax or effusion. No edema. Apical pleural thickening. Tortuous and ectatic aorta. Overlapping cardiac leads. IMPRESSION: No acute cardiopulmonary disease. Electronically Signed   By: Jill Side M.D.   On: 01/11/2023 19:16      Subjective: No significant events overnight as discussed with staff, overall her appetite has improved over the last 48 hours.  Discharge Exam: Vitals:   01/21/23 2326 01/22/23 0810  BP: 115/61 (!) 140/77  Pulse: 70 71  Resp: 16 14  Temp: 98.6 F (37 C) 98.3 F (36.8 C)  SpO2: 91% 91%   Vitals:   01/21/23 2000 01/21/23 2326 01/22/23 0440 01/22/23 0810  BP: 115/64 115/61  (!) 140/77  Pulse: 72 70  71  Resp: '18 16  14  '$ Temp: 98.6 F (37 C) 98.6 F (37 C)  98.3 F (36.8 C)  TempSrc: Oral Oral  Oral  SpO2: 94% 91%  91%  Weight:   87.7 kg   Height:        General: Pt is awake, frail,  deconditioned Cardiovascular: RRR, S1/S2 +, no rubs, no gallops Respiratory: CTA bilaterally, no wheezing, no rhonchi Abdominal: Soft, NT, ND, bowel sounds + Extremities: no edema, no cyanosis    The results of significant diagnostics from this hospitalization (including imaging, microbiology, ancillary and laboratory) are listed below for reference.     Microbiology: Recent Results (from the past 240 hour(s))  Resp panel by RT-PCR (RSV, Flu A&B, Covid) Anterior Nasal Swab     Status: Abnormal   Collection Time: 01/13/23 11:14 PM   Specimen: Anterior Nasal Swab  Result Value Ref Range Status   SARS Coronavirus 2 by RT PCR POSITIVE (A) NEGATIVE Final    Comment: (NOTE) SARS-CoV-2 target nucleic acids are DETECTED.  The SARS-CoV-2 RNA is generally detectable in upper respiratory specimens during the acute phase of infection. Positive results are  indicative of the presence of the identified virus, but do not rule out bacterial infection or co-infection with other pathogens not detected by the test. Clinical correlation with patient history and other diagnostic information is necessary to determine patient infection status. The expected result is Negative.  Fact Sheet for Patients: EntrepreneurPulse.com.au  Fact Sheet for Healthcare Providers: IncredibleEmployment.be  This test is not yet approved or cleared by the [place] FDA and  has been authorized for detection and/or diagnosis of SARS-CoV-2 by FDA under an Emergency Use Authorization (EUA).  This EUA will remain in effect (meaning this test can be used) for the duration of  the COVID-19 declaration under Section 564(b)(1) of the A ct, 21 U.S.C. section 360bbb-3(b)(1), unless the authorization is terminated or revoked sooner.     Influenza A by PCR NEGATIVE NEGATIVE Final   Influenza B by PCR NEGATIVE NEGATIVE Final    Comment: (NOTE) The Xpert Xpress SARS-CoV-2/FLU/RSV plus  assay is intended as an aid in the diagnosis of influenza from Nasopharyngeal swab specimens and should not be used as a sole basis for treatment. Nasal washings and aspirates are unacceptable for Xpert Xpress SARS-CoV-2/FLU/RSV testing.  Fact Sheet for Patients: EntrepreneurPulse.com.au  Fact Sheet for Healthcare Providers: IncredibleEmployment.be  This test is not yet approved or cleared by the [place] FDA and has been authorized for detection and/or diagnosis of SARS-CoV-2 by FDA under an Emergency Use Authorization (EUA). This EUA will remain in effect (meaning this test can be used) for the duration of the COVID-19 declaration under Section 564(b)(1) of the Act, 21 U.S.C. section 360bbb-3(b)(1), unless the authorization is terminated or revoked.     Resp Syncytial Virus by PCR NEGATIVE NEGATIVE Final    Comment: (NOTE) Fact Sheet for Patients: EntrepreneurPulse.com.au  Fact Sheet for Healthcare Providers: IncredibleEmployment.be  This test is not yet approved or cleared by the [place] FDA and has been authorized for detection and/or diagnosis of SARS-CoV-2 by FDA under an Emergency Use Authorization (EUA). This EUA will remain in effect (meaning this test can be used) for the duration of the COVID-19 declaration under Section 564(b)(1) of the Act, 21 U.S.C. section 360bbb-3(b)(1), unless the authorization is terminated or revoked.  Performed at [facility], [place] [address], [place], [place] [address]   Culture, blood (Routine X 2) w Reflex to ID Panel     Status: None   Collection Time: 01/17/23  7:47 AM   Specimen: BLOOD LEFT HAND  Result Value Ref Range Status   Specimen Description BLOOD LEFT HAND  Final   Special Requests   Final    BOTTLES DRAWN AEROBIC ONLY Blood Culture results may not be optimal due to an inadequate volume of blood received in culture bottles   Culture    Final    NO GROWTH 5 DAYS Performed at [facility], [place] [address], [place], [place] [address]    Report Status 01/22/2023 FINAL  Final  Culture, blood (Routine X 2) w Reflex to ID Panel     Status: None   Collection Time: 01/17/23  8:01 AM   Specimen: BLOOD  Result Value Ref Range Status   Specimen Description BLOOD LEFT ANTECUBITAL  Final   Special Requests   Final    BOTTLES DRAWN AEROBIC AND ANAEROBIC Blood Culture adequate volume   Culture   Final    NO GROWTH 5 DAYS Performed at [facility], [place] [address], [place], [place] [address]    Report  Status 01/22/2023 FINAL  Final     Labs: BNP (last 3 results) Recent Labs    01/20/23 0658 01/21/23 0708 01/22/23 0749  BNP 53.8 87.2 15.1   Basic Metabolic Panel: Recent Labs  Lab 01/18/23 0653 01/19/23 0755 01/20/23 0658 01/21/23 0708 01/22/23 0749  NA 140 137 139 138 139  K 4.4 3.2* 4.0 4.1 3.7  CL 96* 97* 96* 97* 97*  CO2 38* 34* 35* 34* 36*  GLUCOSE 115* 137* 97 110* 98  BUN 34* 29* '22 20 23  '$ CREATININE 0.66 0.52 0.72 0.67 0.70  CALCIUM 8.5* 8.3* 7.9* 7.9* 8.2*  MG 2.3 1.8 1.9 1.9 2.0  PHOS 2.7 1.7* 3.1 3.4 3.4   Liver Function Tests: Recent Labs  Lab 01/17/23 0804 01/19/23 0755 01/20/23 0658 01/21/23 0708 01/22/23 0749  AST '26 22 30 26 27  '$ ALT '24 19 20 20 20  '$ ALKPHOS 45 42 43 41 46  BILITOT 0.6 0.1* 0.6 0.6 0.6  PROT 5.6* 5.1* 5.0* 5.0* 5.1*  ALBUMIN 2.1* 1.9* 2.0* 1.9* 2.0*   No results for input(s): "LIPASE", "AMYLASE" in the last 168 hours. No results for input(s): "AMMONIA" in the last 168 hours. CBC: Recent Labs  Lab 01/18/23 0653 01/19/23 0755 01/20/23 0658 01/21/23 0708 01/22/23 0749  WBC 3.6* 4.2 4.4 7.8 6.6  NEUTROABS  --  2.3 3.4 5.5 3.8  HGB 11.9* 12.3 12.2 11.8* 12.1  HCT 36.3 35.9* 37.2 34.7* 36.4  MCV 96.8 95.0 95.4 93.8 93.1  PLT 93* 132* 178 172 210   Cardiac Enzymes: No results for input(s): "CKTOTAL", "CKMB", "CKMBINDEX", "TROPONINI" in the last 168  hours. BNP: Invalid input(s): "POCBNP" CBG: Recent Labs  Lab 01/17/23 1236 01/17/23 1618 01/17/23 2027 01/18/23 1214  GLUCAP 106* 130* 142* 156*   D-Dimer No results for input(s): "DDIMER" in the last 72 hours. Hgb A1c No results for input(s): "HGBA1C" in the last 72 hours. Lipid Profile No results for input(s): "CHOL", "HDL", "LDLCALC", "TRIG", "CHOLHDL", "LDLDIRECT" in the last 72 hours. Thyroid function studies No results for input(s): "TSH", "T4TOTAL", "T3FREE", "THYROIDAB" in the last 72 hours.  Invalid input(s): "FREET3" Anemia work up No results for input(s): "VITAMINB12", "FOLATE", "FERRITIN", "TIBC", "IRON", "RETICCTPCT" in the last 72 hours. Urinalysis    Component Value Date/Time   COLORURINE YELLOW 01/11/2023 2207   APPEARANCEUR CLOUDY (A) 01/11/2023 2207   LABSPEC 1.014 01/11/2023 2207   PHURINE 8.0 01/11/2023 2207   GLUCOSEU NEGATIVE 01/11/2023 2207   HGBUR NEGATIVE 01/11/2023 2207   BILIRUBINUR NEGATIVE 01/11/2023 2207   KETONESUR NEGATIVE 01/11/2023 2207   PROTEINUR NEGATIVE 01/11/2023 2207   UROBILINOGEN 0.2 08/26/2014 1546   NITRITE NEGATIVE 01/11/2023 2207   LEUKOCYTESUR LARGE (A) 01/11/2023 2207   Sepsis Labs Recent Labs  Lab 01/19/23 0755 01/20/23 0658 01/21/23 0708 01/22/23 0749  WBC 4.2 4.4 7.8 6.6   Microbiology Recent Results (from the past 240 hour(s))  Resp panel by RT-PCR (RSV, Flu A&B, Covid) Anterior Nasal Swab     Status: Abnormal   Collection Time: 01/13/23 11:14 PM   Specimen: Anterior Nasal Swab  Result Value Ref Range Status   SARS Coronavirus 2 by RT PCR POSITIVE (A) NEGATIVE Final    Comment: (NOTE) SARS-CoV-2 target nucleic acids are DETECTED.  The SARS-CoV-2 RNA is generally detectable in upper respiratory specimens during the acute phase of infection. Positive results are indicative of the presence of the identified virus, but do not rule out bacterial infection or co-infection with other pathogens not detected by  the test. Clinical correlation with patient history and other diagnostic information is necessary to determine patient infection status. The expected result is Negative.  Fact Sheet for Patients: EntrepreneurPulse.com.au  Fact Sheet for Healthcare Providers: IncredibleEmployment.be  This test is not yet approved or cleared by the [place] FDA and  has been authorized for detection and/or diagnosis of SARS-CoV-2 by FDA under an Emergency Use Authorization (EUA).  This EUA will remain in effect (meaning this test can be used) for the duration of  the COVID-19 declaration under Section 564(b)(1) of the A ct, 21 U.S.C. section 360bbb-3(b)(1), unless the authorization is terminated or revoked sooner.     Influenza A by PCR NEGATIVE NEGATIVE Final   Influenza B by PCR NEGATIVE NEGATIVE Final    Comment: (NOTE) The Xpert Xpress SARS-CoV-2/FLU/RSV plus assay is intended as an aid in the diagnosis of influenza from Nasopharyngeal swab specimens and should not be used as a sole basis for treatment. Nasal washings and aspirates are unacceptable for Xpert Xpress SARS-CoV-2/FLU/RSV testing.  Fact Sheet for Patients: EntrepreneurPulse.com.au  Fact Sheet for Healthcare Providers: IncredibleEmployment.be  This test is not yet approved or cleared by the [place] FDA and has been authorized for detection and/or diagnosis of SARS-CoV-2 by FDA under an Emergency Use Authorization (EUA). This EUA will remain in effect (meaning this test can be used) for the duration of the COVID-19 declaration under Section 564(b)(1) of the Act, 21 U.S.C. section 360bbb-3(b)(1), unless the authorization is terminated or revoked.     Resp Syncytial Virus by PCR NEGATIVE NEGATIVE Final    Comment: (NOTE) Fact Sheet for Patients: EntrepreneurPulse.com.au  Fact Sheet for Healthcare  Providers: IncredibleEmployment.be  This test is not yet approved or cleared by the [place] FDA and has been authorized for detection and/or diagnosis of SARS-CoV-2 by FDA under an Emergency Use Authorization (EUA). This EUA will remain in effect (meaning this test can be used) for the duration of the COVID-19 declaration under Section 564(b)(1) of the Act, 21 U.S.C. section 360bbb-3(b)(1), unless the authorization is terminated or revoked.  Performed at [facility], [place] [address], [place], [place] [address]   Culture, blood (Routine X 2) w Reflex to ID Panel     Status: None   Collection Time: 01/17/23  7:47 AM   Specimen: BLOOD LEFT HAND  Result Value Ref Range Status   Specimen Description BLOOD LEFT HAND  Final   Special Requests   Final    BOTTLES DRAWN AEROBIC ONLY Blood Culture results may not be optimal due to an inadequate volume of blood received in culture bottles   Culture   Final    NO GROWTH 5 DAYS Performed at [facility], [place] [address] [place], [place] [address]    Report Status 01/22/2023 FINAL  Final  Culture, blood (Routine X 2) w Reflex to ID Panel     Status: None   Collection Time: 01/17/23  8:01 AM   Specimen: BLOOD  Result Value Ref Range Status   Specimen Description BLOOD LEFT ANTECUBITAL  Final   Special Requests   Final    BOTTLES DRAWN AEROBIC AND ANAEROBIC Blood Culture adequate volume   Culture   Final    NO GROWTH 5 DAYS Performed at [facility], [place] [address], [place], [place] [address]    Report Status 01/22/2023 FINAL  Final     Time coordinating discharge: Over 30 minutes  SIGNED:   Phillips Climes, MD  [facility] 01/22/2023, 10:55 AM Pager   If 7PM-7AM, please contact night-coverage [email or web address]

## 2023-01-27 DIAGNOSIS — I10 Essential (primary) hypertension: Secondary | ICD-10-CM | POA: Diagnosis not present

## 2023-01-27 DIAGNOSIS — G9341 Metabolic encephalopathy: Secondary | ICD-10-CM | POA: Diagnosis not present

## 2023-01-27 DIAGNOSIS — G3109 Other frontotemporal dementia: Secondary | ICD-10-CM | POA: Diagnosis not present

## 2023-01-27 DIAGNOSIS — B9620 Unspecified Escherichia coli [E. coli] as the cause of diseases classified elsewhere: Secondary | ICD-10-CM | POA: Diagnosis not present

## 2023-01-27 DIAGNOSIS — J1282 Pneumonia due to coronavirus disease 2019: Secondary | ICD-10-CM | POA: Diagnosis not present

## 2023-01-27 DIAGNOSIS — F0280 Dementia in other diseases classified elsewhere without behavioral disturbance: Secondary | ICD-10-CM | POA: Diagnosis not present

## 2023-01-27 DIAGNOSIS — U071 COVID-19: Secondary | ICD-10-CM | POA: Diagnosis not present

## 2023-01-27 DIAGNOSIS — N390 Urinary tract infection, site not specified: Secondary | ICD-10-CM | POA: Diagnosis not present

## 2023-01-27 DIAGNOSIS — G40209 Localization-related (focal) (partial) symptomatic epilepsy and epileptic syndromes with complex partial seizures, not intractable, without status epilepticus: Secondary | ICD-10-CM | POA: Diagnosis not present

## 2023-02-02 ENCOUNTER — Encounter (HOSPITAL_COMMUNITY): Payer: Self-pay

## 2023-02-02 ENCOUNTER — Inpatient Hospital Stay (HOSPITAL_COMMUNITY)
Admission: EM | Admit: 2023-02-02 | Discharge: 2023-02-05 | DRG: 641 | Disposition: A | Discharge status: Home Health | Payer: Medicare PPO | Attending: Family Medicine | Admitting: Family Medicine

## 2023-02-02 ENCOUNTER — Other Ambulatory Visit: Payer: Self-pay

## 2023-02-02 ENCOUNTER — Inpatient Hospital Stay (HOSPITAL_COMMUNITY): Payer: Medicare PPO

## 2023-02-02 ENCOUNTER — Emergency Department (HOSPITAL_COMMUNITY): Payer: Medicare PPO

## 2023-02-02 DIAGNOSIS — Z6832 Body mass index (BMI) 32.0-32.9, adult: Secondary | ICD-10-CM

## 2023-02-02 DIAGNOSIS — Z1152 Encounter for screening for COVID-19: Secondary | ICD-10-CM

## 2023-02-02 DIAGNOSIS — I2510 Atherosclerotic heart disease of native coronary artery without angina pectoris: Secondary | ICD-10-CM | POA: Diagnosis not present

## 2023-02-02 DIAGNOSIS — I472 Ventricular tachycardia, unspecified: Secondary | ICD-10-CM | POA: Diagnosis not present

## 2023-02-02 DIAGNOSIS — J9601 Acute respiratory failure with hypoxia: Secondary | ICD-10-CM | POA: Diagnosis present

## 2023-02-02 DIAGNOSIS — R109 Unspecified abdominal pain: Secondary | ICD-10-CM | POA: Diagnosis not present

## 2023-02-02 DIAGNOSIS — R0902 Hypoxemia: Secondary | ICD-10-CM | POA: Diagnosis present

## 2023-02-02 DIAGNOSIS — Z79890 Hormone replacement therapy (postmenopausal): Secondary | ICD-10-CM

## 2023-02-02 DIAGNOSIS — R55 Syncope and collapse: Secondary | ICD-10-CM | POA: Diagnosis not present

## 2023-02-02 DIAGNOSIS — R0681 Apnea, not elsewhere classified: Secondary | ICD-10-CM | POA: Diagnosis not present

## 2023-02-02 DIAGNOSIS — Z8669 Personal history of other diseases of the nervous system and sense organs: Secondary | ICD-10-CM | POA: Diagnosis not present

## 2023-02-02 DIAGNOSIS — G40109 Localization-related (focal) (partial) symptomatic epilepsy and epileptic syndromes with simple partial seizures, not intractable, without status epilepticus: Secondary | ICD-10-CM | POA: Diagnosis not present

## 2023-02-02 DIAGNOSIS — K760 Fatty (change of) liver, not elsewhere classified: Secondary | ICD-10-CM | POA: Diagnosis not present

## 2023-02-02 DIAGNOSIS — L89156 Pressure-induced deep tissue damage of sacral region: Secondary | ICD-10-CM | POA: Diagnosis not present

## 2023-02-02 DIAGNOSIS — R4020 Unspecified coma: Secondary | ICD-10-CM | POA: Diagnosis not present

## 2023-02-02 DIAGNOSIS — F0390 Unspecified dementia without behavioral disturbance: Secondary | ICD-10-CM | POA: Diagnosis not present

## 2023-02-02 DIAGNOSIS — Z7401 Bed confinement status: Secondary | ICD-10-CM | POA: Diagnosis not present

## 2023-02-02 DIAGNOSIS — G3109 Other frontotemporal dementia: Secondary | ICD-10-CM | POA: Diagnosis present

## 2023-02-02 DIAGNOSIS — R627 Adult failure to thrive: Secondary | ICD-10-CM | POA: Diagnosis not present

## 2023-02-02 DIAGNOSIS — E039 Hypothyroidism, unspecified: Secondary | ICD-10-CM | POA: Diagnosis not present

## 2023-02-02 DIAGNOSIS — F0280 Dementia in other diseases classified elsewhere without behavioral disturbance: Secondary | ICD-10-CM | POA: Diagnosis present

## 2023-02-02 DIAGNOSIS — R410 Disorientation, unspecified: Secondary | ICD-10-CM | POA: Diagnosis not present

## 2023-02-02 DIAGNOSIS — U099 Post covid-19 condition, unspecified: Secondary | ICD-10-CM | POA: Diagnosis present

## 2023-02-02 DIAGNOSIS — I700 Atherosclerosis of aorta: Secondary | ICD-10-CM | POA: Diagnosis not present

## 2023-02-02 DIAGNOSIS — E871 Hypo-osmolality and hyponatremia: Secondary | ICD-10-CM | POA: Diagnosis not present

## 2023-02-02 DIAGNOSIS — J9600 Acute respiratory failure, unspecified whether with hypoxia or hypercapnia: Secondary | ICD-10-CM | POA: Diagnosis present

## 2023-02-02 DIAGNOSIS — G40909 Epilepsy, unspecified, not intractable, without status epilepticus: Secondary | ICD-10-CM | POA: Diagnosis not present

## 2023-02-02 DIAGNOSIS — Z993 Dependence on wheelchair: Secondary | ICD-10-CM | POA: Diagnosis not present

## 2023-02-02 DIAGNOSIS — Z87820 Personal history of traumatic brain injury: Secondary | ICD-10-CM

## 2023-02-02 DIAGNOSIS — J984 Other disorders of lung: Secondary | ICD-10-CM | POA: Diagnosis not present

## 2023-02-02 DIAGNOSIS — R918 Other nonspecific abnormal finding of lung field: Secondary | ICD-10-CM | POA: Diagnosis not present

## 2023-02-02 DIAGNOSIS — G9341 Metabolic encephalopathy: Secondary | ICD-10-CM | POA: Diagnosis present

## 2023-02-02 DIAGNOSIS — E669 Obesity, unspecified: Secondary | ICD-10-CM | POA: Diagnosis present

## 2023-02-02 DIAGNOSIS — U071 COVID-19: Secondary | ICD-10-CM

## 2023-02-02 DIAGNOSIS — Z515 Encounter for palliative care: Secondary | ICD-10-CM | POA: Diagnosis not present

## 2023-02-02 DIAGNOSIS — R4182 Altered mental status, unspecified: Secondary | ICD-10-CM | POA: Diagnosis not present

## 2023-02-02 HISTORY — DX: Personal history of other diseases of the nervous system and sense organs: Z86.69

## 2023-02-02 HISTORY — DX: COVID-19: U07.1

## 2023-02-02 LAB — MRSA NEXT GEN BY PCR, NASAL: MRSA by PCR Next Gen: NOT DETECTED

## 2023-02-02 LAB — CBC WITH DIFFERENTIAL/PLATELET
Abs Immature Granulocytes: 0.02 10*3/uL (ref 0.00–0.07)
Basophils Absolute: 0 10*3/uL (ref 0.0–0.1)
Basophils Relative: 0 %
Eosinophils Absolute: 0.1 10*3/uL (ref 0.0–0.5)
Eosinophils Relative: 1 %
HCT: 37.2 % (ref 36.0–46.0)
Hemoglobin: 12.4 g/dL (ref 12.0–15.0)
Immature Granulocytes: 0 %
Lymphocytes Relative: 25 %
Lymphs Abs: 1.2 10*3/uL (ref 0.7–4.0)
MCH: 33.4 pg (ref 26.0–34.0)
MCHC: 33.3 g/dL (ref 30.0–36.0)
MCV: 100.3 fL — ABNORMAL HIGH (ref 80.0–100.0)
Monocytes Absolute: 0.6 10*3/uL (ref 0.1–1.0)
Monocytes Relative: 13 %
Neutro Abs: 2.9 10*3/uL (ref 1.7–7.7)
Neutrophils Relative %: 61 %
Platelets: 157 10*3/uL (ref 150–400)
RBC: 3.71 MIL/uL — ABNORMAL LOW (ref 3.87–5.11)
RDW: 16.2 % — ABNORMAL HIGH (ref 11.5–15.5)
WBC: 4.8 10*3/uL (ref 4.0–10.5)
nRBC: 0 % (ref 0.0–0.2)

## 2023-02-02 LAB — LACTIC ACID, PLASMA
Lactic Acid, Venous: 0.8 mmol/L (ref 0.5–1.9)
Lactic Acid, Venous: 0.9 mmol/L (ref 0.5–1.9)

## 2023-02-02 LAB — I-STAT VENOUS BLOOD GAS, ED
Acid-Base Excess: 12 mmol/L — ABNORMAL HIGH (ref 0.0–2.0)
Bicarbonate: 35.7 mmol/L — ABNORMAL HIGH (ref 20.0–28.0)
Calcium, Ion: 1.05 mmol/L — ABNORMAL LOW (ref 1.15–1.40)
HCT: 37 % (ref 36.0–46.0)
Hemoglobin: 12.6 g/dL (ref 12.0–15.0)
O2 Saturation: 94 %
Potassium: 4.8 mmol/L (ref 3.5–5.1)
Sodium: 136 mmol/L (ref 135–145)
TCO2: 37 mmol/L — ABNORMAL HIGH (ref 22–32)
pCO2, Ven: 41.9 mmHg — ABNORMAL LOW (ref 44–60)
pH, Ven: 7.539 — ABNORMAL HIGH (ref 7.25–7.43)
pO2, Ven: 64 mmHg — ABNORMAL HIGH (ref 32–45)

## 2023-02-02 LAB — VALPROIC ACID LEVEL: Valproic Acid Lvl: 75 ug/mL (ref 50.0–100.0)

## 2023-02-02 LAB — COMPREHENSIVE METABOLIC PANEL
ALT: 11 U/L (ref 0–44)
AST: 18 U/L (ref 15–41)
Albumin: 1.9 g/dL — ABNORMAL LOW (ref 3.5–5.0)
Alkaline Phosphatase: 48 U/L (ref 38–126)
Anion gap: 9 (ref 5–15)
BUN: 22 mg/dL (ref 8–23)
CO2: 29 mmol/L (ref 22–32)
Calcium: 8.3 mg/dL — ABNORMAL LOW (ref 8.9–10.3)
Chloride: 98 mmol/L (ref 98–111)
Creatinine, Ser: 0.65 mg/dL (ref 0.44–1.00)
GFR, Estimated: >60 mL/min (ref >60)
Glucose, Bld: 86 mg/dL (ref 70–99)
Potassium: 4.1 mmol/L (ref 3.5–5.1)
Sodium: 136 mmol/L (ref 135–145)
Total Bilirubin: 0.3 mg/dL (ref 0.3–1.2)
Total Protein: 5.1 g/dL — ABNORMAL LOW (ref 6.5–8.1)

## 2023-02-02 LAB — RESP PANEL BY RT-PCR (RSV, FLU A&B, COVID)  RVPGX2
Influenza A by PCR: NEGATIVE
Influenza B by PCR: NEGATIVE
Resp Syncytial Virus by PCR: NEGATIVE
SARS Coronavirus 2 by RT PCR: POSITIVE — AB

## 2023-02-02 LAB — I-STAT CHEM 8, ED
BUN: 31 mg/dL — ABNORMAL HIGH (ref 8–23)
Calcium, Ion: 1.04 mmol/L — ABNORMAL LOW (ref 1.15–1.40)
Chloride: 98 mmol/L (ref 98–111)
Creatinine, Ser: 0.5 mg/dL (ref 0.44–1.00)
Glucose, Bld: 92 mg/dL (ref 70–99)
HCT: 35 % — ABNORMAL LOW (ref 36.0–46.0)
Hemoglobin: 11.9 g/dL — ABNORMAL LOW (ref 12.0–15.0)
Potassium: 4.8 mmol/L (ref 3.5–5.1)
Sodium: 136 mmol/L (ref 135–145)
TCO2: 30 mmol/L (ref 22–32)

## 2023-02-02 LAB — TROPONIN I (HIGH SENSITIVITY)
Troponin I (High Sensitivity): 4 ng/L (ref <18)
Troponin I (High Sensitivity): 5 ng/L (ref <18)

## 2023-02-02 LAB — AMMONIA: Ammonia: 20 umol/L (ref 9–35)

## 2023-02-02 LAB — TSH: TSH: 8.545 u[IU]/mL — ABNORMAL HIGH (ref 0.350–4.500)

## 2023-02-02 LAB — T4, FREE: Free T4: 1.31 ng/dL — ABNORMAL HIGH (ref 0.61–1.12)

## 2023-02-02 MED ORDER — VANCOMYCIN HCL IN DEXTROSE 1-5 GM/200ML-% IV SOLN
1000.0000 mg | Freq: Two times a day (BID) | INTRAVENOUS | Status: DC
  Administered 2023-02-02 – 2023-02-03 (×2): 1000 mg via INTRAVENOUS
  Filled 2023-02-02 (×2): qty 200

## 2023-02-02 MED ORDER — IOHEXOL 350 MG/ML SOLN
75.0000 mL | Freq: Once | INTRAVENOUS | Status: AC | PRN
  Administered 2023-02-02: 75 mL via INTRAVENOUS

## 2023-02-02 MED ORDER — LACTATED RINGERS IV BOLUS
1000.0000 mL | Freq: Once | INTRAVENOUS | Status: AC
  Administered 2023-02-02: 1000 mL via INTRAVENOUS

## 2023-02-02 MED ORDER — LEVOTHYROXINE SODIUM 50 MCG PO TABS
50.0000 ug | ORAL_TABLET | Freq: Every day | ORAL | Status: DC
  Administered 2023-02-04 – 2023-02-05 (×2): 50 ug via ORAL
  Filled 2023-02-02 (×2): qty 1

## 2023-02-02 MED ORDER — SODIUM CHLORIDE 0.9 % IV SOLN
1.0000 g | Freq: Once | INTRAVENOUS | Status: AC
  Administered 2023-02-02: 1 g via INTRAVENOUS
  Filled 2023-02-02: qty 10

## 2023-02-02 NOTE — H&P (Signed)
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 [518.81.ICD-9-CM]  Admitting Physician: Toy Baker [[id number]]  Attending Physician: Toy Baker [[id number]]  Certification:: I certify this patient will need inpatient services for at least 2 midnights  Estimated Length of Stay: 2          inpatient     I Expect 2 midnight stay secondary to severity of patient's current illness need for inpatient interventions justified by the following:    Severe lab/radiological/exam abnormalities including:    hypoxia and extensive comorbidities including:  dementia    That are currently affecting medical management.   I expect  patient to  be hospitalized for 2 midnights requiring inpatient medical care.  Patient is at high risk for adverse outcome (such as loss of life or disability) if not treated.  Indication for inpatient stay as follows:  Severe change from baseline regarding mental status  New or worsening hypoxia   Need for  IV fluids     Level of care    progressive tele indefinitely please discontinue once patient no longer qualifies COVID-19 Labs    Lab Results  Component Value Date   [place] (A) 02/02/2023     Precautions: admitted as  covid positive Airborne and Contact precautions  ion given very high risk for false native test result    Cloyce Blankenhorn 02/02/2023, 10:32 PM    [facility]     after 2 AM please page floor coverage PA If 7AM-7PM, please  contact the day team taking care of the patient using [email or web address]   Patient was evaluated in the context of the global COVID-19 pandemic, which necessitated consideration that the patient might be at risk for infection with the SARS-CoV-2 virus that causes COVID-19. Institutional protocols and algorithms that pertain to the evaluation of patients at risk for COVID-19 are in a state of rapid change based on information released by regulatory bodies including the CDC and federal and state organizations. These policies and algorithms were followed during the patient's care.

## 2023-02-02 NOTE — Assessment & Plan Note (Signed)
>>  ASSESSMENT AND PLAN FOR ACUTE HYPOXIC RESPIRATORY FAILURE ([place]) WRITTEN ON 02/02/2023  7:05 PM BY Toy Baker, MD   this patient has acute respiratory failure with Hypoxia as documented by the presence of following: O2 saturatio< 90% on RA  Likely due to:  recent COVID  Provide O2 therapy and titrate as needed  Continuous pulse ox   check Pulse ox with ambulation prior to discharge   may need  TC consult for home O2 set up    flutter valve ordered

## 2023-02-02 NOTE — Assessment & Plan Note (Signed)
Pt persistently testing positive for COVID 19  No infiltrate no PE  Noted hypoxia with evidence of atelectasis on CXR  Recent covid infection could contribute to decreased alertness  Supportive measures

## 2023-02-02 NOTE — Progress Notes (Signed)
Pharmacy Antibiotic Note  [name] is a 67 y.o. female admitted on 02/02/2023 with pneumonia.  Pharmacy has been consulted for vancomycin dosing.  Plan: Give IV vancomycin '1000mg'$  Q12H, eAUC: 530, Vd: 0.5 -- BMI:32 , Scr: 0.5.  CFP x1 per EDP.  Follow culture data for de-escalation.  Monitor renal function for dose adjustments as indicated.   Temp (24hrs), Avg:99.4 F (37.4 C), Min:99.4 F (37.4 C), Max:99.4 F (37.4 C)  Recent Labs  Lab 02/02/23 1254 02/02/23 1313  WBC 4.8  --   CREATININE 0.65 0.50  LATICACIDVEN 0.8  --     Estimated Creatinine Clearance: 74.7 mL/min (by C-G formula based on SCr of 0.5 mg/dL).    No Known Allergies  Thank you for allowing pharmacy to be a part of this patient's care.  Esmeralda Arthur, PharmD, BCCCP  02/02/2023 3:17 PM

## 2023-02-02 NOTE — Assessment & Plan Note (Addendum)
Check Depakote  level and continue at 1000 mg po qhs

## 2023-02-02 NOTE — Progress Notes (Signed)
EEG complete - results pending 

## 2023-02-02 NOTE — Assessment & Plan Note (Signed)
this patient has acute respiratory failure with Hypoxia as documented by the presence of following: O2 saturatio< 90% on RA  Likely due to:  recent COVID  Provide O2 therapy and titrate as needed  Continuous pulse ox   check Pulse ox with ambulation prior to discharge   may need  TC consult for home O2 set up    flutter valve ordered

## 2023-02-02 NOTE — Assessment & Plan Note (Signed)
Continue  Rythmol 225 mg bid

## 2023-02-02 NOTE — Assessment & Plan Note (Signed)
Unclear etiology troponin unremarkable CTA negative for PE Likely in the setting of transient hypoxia unclear cause CT imaging showing no evidence of significant infiltrate Patient does has known history of seizure disorder. Obtain EEG Follow for any seizure activities. Monitor on telemetry Obtain echogram as well Pending for the workup may need neurology versus cardiology consult

## 2023-02-02 NOTE — Assessment & Plan Note (Signed)
Start synthroid at 50 mcg a day

## 2023-02-02 NOTE — Assessment & Plan Note (Signed)
VBG unremarkable  Will evaluate for any source of infection  Ordered EEG No localized neurological deficits  Pt is arousable  Seems transient hypoxia was contributing to mental status  Will need home o2 if not improved
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
## 2023-02-02 NOTE — ED Provider Notes (Signed)
Care of the patient assumed at signout.  On exam patient is in no distress, interacting at baseline manner according to her husband.  I removed to the nasal drop trumpet patient continues to require supplemental oxygen.   Carmin Muskrat, MD 02/02/23 [place]

## 2023-02-02 NOTE — Subjective & Objective (Signed)
Pt with hx of Dementia had Covid last month and was hospitalized with sepsis secondary to UTI At baseline patient unable to provide her own history Was not brought in from home when family noted that she looked unresponsive and bluish checked her oxygen on home monitoring was 70 EMS arrived started on nonrebreather CBG was 102 blood pressure 116 over 60s 84 heart rate No seizure witnessed although patient does has history of seizures and baseline and uses Depakote Has been providing history states that patient continues to cough since her COVID diagnosis and have had worse fatigue

## 2023-02-02 NOTE — Assessment & Plan Note (Signed)
Continue Depakote 1000 mg p.o. nightly And Onfi would restart home dose 10 mg in the morning and 50 mg at bedtime by mouth Depakote level was therapeutic

## 2023-02-03 ENCOUNTER — Inpatient Hospital Stay (HOSPITAL_COMMUNITY): Payer: Medicare PPO

## 2023-02-03 DIAGNOSIS — J9601 Acute respiratory failure with hypoxia: Secondary | ICD-10-CM | POA: Diagnosis not present

## 2023-02-03 DIAGNOSIS — R627 Adult failure to thrive: Secondary | ICD-10-CM | POA: Diagnosis present

## 2023-02-03 DIAGNOSIS — R55 Syncope and collapse: Secondary | ICD-10-CM | POA: Diagnosis not present

## 2023-02-03 DIAGNOSIS — F0390 Unspecified dementia without behavioral disturbance: Secondary | ICD-10-CM

## 2023-02-03 DIAGNOSIS — R4182 Altered mental status, unspecified: Secondary | ICD-10-CM

## 2023-02-03 DIAGNOSIS — U071 COVID-19: Secondary | ICD-10-CM | POA: Diagnosis not present

## 2023-02-03 LAB — CBC
HCT: 37.7 % (ref 36.0–46.0)
Hemoglobin: 11.7 g/dL — ABNORMAL LOW (ref 12.0–15.0)
MCH: 32.4 pg (ref 26.0–34.0)
MCHC: 31 g/dL (ref 30.0–36.0)
MCV: 104.4 fL — ABNORMAL HIGH (ref 80.0–100.0)
Platelets: 123 10*3/uL — ABNORMAL LOW (ref 150–400)
RBC: 3.61 MIL/uL — ABNORMAL LOW (ref 3.87–5.11)
RDW: 15.6 % — ABNORMAL HIGH (ref 11.5–15.5)
WBC: 5 10*3/uL (ref 4.0–10.5)
nRBC: 0 % (ref 0.0–0.2)

## 2023-02-03 LAB — COMPREHENSIVE METABOLIC PANEL
ALT: 10 U/L (ref 0–44)
AST: 19 U/L (ref 15–41)
Albumin: 1.7 g/dL — ABNORMAL LOW (ref 3.5–5.0)
Alkaline Phosphatase: 44 U/L (ref 38–126)
Anion gap: 9 (ref 5–15)
BUN: 17 mg/dL (ref 8–23)
CO2: 27 mmol/L (ref 22–32)
Calcium: 8 mg/dL — ABNORMAL LOW (ref 8.9–10.3)
Chloride: 97 mmol/L — ABNORMAL LOW (ref 98–111)
Creatinine, Ser: 0.46 mg/dL (ref 0.44–1.00)
GFR, Estimated: >60 mL/min (ref >60)
Glucose, Bld: 145 mg/dL — ABNORMAL HIGH (ref 70–99)
Potassium: 4.4 mmol/L (ref 3.5–5.1)
Sodium: 133 mmol/L — ABNORMAL LOW (ref 135–145)
Total Bilirubin: 0.3 mg/dL (ref 0.3–1.2)
Total Protein: 4.9 g/dL — ABNORMAL LOW (ref 6.5–8.1)

## 2023-02-03 LAB — ECHOCARDIOGRAM COMPLETE
Area-P 1/2: 3.89 cm2
S' Lateral: 2.6 cm

## 2023-02-03 LAB — HEPATIC FUNCTION PANEL
ALT: 10 U/L (ref 0–44)
AST: 17 U/L (ref 15–41)
Albumin: 1.7 g/dL — ABNORMAL LOW (ref 3.5–5.0)
Alkaline Phosphatase: 45 U/L (ref 38–126)
Bilirubin, Direct: 0.2 mg/dL (ref 0.0–0.2)
Indirect Bilirubin: 0.2 mg/dL — ABNORMAL LOW (ref 0.3–0.9)
Total Bilirubin: 0.4 mg/dL (ref 0.3–1.2)
Total Protein: 5 g/dL — ABNORMAL LOW (ref 6.5–8.1)

## 2023-02-03 LAB — BLOOD CULTURE ID PANEL (REFLEXED) - BCID2

## 2023-02-03 LAB — MAGNESIUM: Magnesium: 1.6 mg/dL — ABNORMAL LOW (ref 1.7–2.4)

## 2023-02-03 LAB — PROTIME-INR
INR: 1.1 (ref 0.8–1.2)
Prothrombin Time: 13.8 seconds (ref 11.4–15.2)

## 2023-02-03 LAB — PROCALCITONIN: Procalcitonin: <0.1 ng/mL

## 2023-02-03 LAB — PHOSPHORUS: Phosphorus: 3.3 mg/dL (ref 2.5–4.6)

## 2023-02-03 LAB — CK: Total CK: 36 U/L — ABNORMAL LOW (ref 38–234)

## 2023-02-03 MED ORDER — CLOBAZAM 10 MG PO TABS
10.0000 mg | ORAL_TABLET | Freq: Every morning | ORAL | Status: DC
  Administered 2023-02-04 – 2023-02-05 (×2): 10 mg via ORAL
  Filled 2023-02-03 (×3): qty 1

## 2023-02-03 MED ORDER — MAGNESIUM SULFATE 2 GM/50ML IV SOLN
2.0000 g | Freq: Once | INTRAVENOUS | Status: AC
  Administered 2023-02-03: 2 g via INTRAVENOUS
  Filled 2023-02-03: qty 50

## 2023-02-03 MED ORDER — PROPAFENONE HCL ER 225 MG PO CP12
225.0000 mg | ORAL_CAPSULE | Freq: Two times a day (BID) | ORAL | Status: DC
  Administered 2023-02-03 – 2023-02-05 (×5): 225 mg via ORAL
  Filled 2023-02-03 (×8): qty 1

## 2023-02-03 MED ORDER — POLYETHYLENE GLYCOL 3350 17 G PO PACK
17.0000 g | PACK | Freq: Every day | ORAL | Status: DC
  Administered 2023-02-04 – 2023-02-05 (×2): 17 g via ORAL
  Filled 2023-02-03 (×2): qty 1

## 2023-02-03 MED ORDER — SODIUM CHLORIDE 0.9 % IV SOLN: INTRAVENOUS | Status: DC

## 2023-02-03 MED ORDER — ACETAMINOPHEN 325 MG PO TABS: 650.0000 mg | ORAL_TABLET | Freq: Four times a day (QID) | ORAL | Status: DC | PRN

## 2023-02-03 MED ORDER — HYDROCODONE-ACETAMINOPHEN 5-325 MG PO TABS: 1.0000 | ORAL_TABLET | ORAL | Status: DC | PRN

## 2023-02-03 MED ORDER — ACETAMINOPHEN 650 MG RE SUPP: 650.0000 mg | Freq: Four times a day (QID) | RECTAL | Status: DC | PRN

## 2023-02-03 MED ORDER — CLOBAZAM 5 MG PO HALF TABLET
15.0000 mg | ORAL_TABLET | Freq: Every day | ORAL | Status: DC
  Administered 2023-02-03 – 2023-02-04 (×3): 15 mg via ORAL
  Filled 2023-02-03 (×3): qty 1

## 2023-02-03 MED ORDER — DIVALPROEX SODIUM ER 500 MG PO TB24
1000.0000 mg | ORAL_TABLET | Freq: Every day | ORAL | Status: DC
  Administered 2023-02-03 – 2023-02-04 (×3): 1000 mg via ORAL
  Filled 2023-02-03 (×4): qty 2

## 2023-02-03 NOTE — Progress Notes (Deleted)
SATURATION QUALIFICATIONS: (This note is used to comply with regulatory documentation for home oxygen)  Patient Saturations on Room Air at Rest = 89%  Patient Saturations on Room Air while Ambulating = n/a% - (pt is bebound)  Patient Saturations on 2 Liters of oxygen while resting = 95-98%  Please briefly explain why patient needs home oxygen: Pt is unable to ambulate and is bed bound. Pt shows Desaturation while laying in bed.

## 2023-02-03 NOTE — Assessment & Plan Note (Signed)
-   Consult Palliative Care

## 2023-02-03 NOTE — Hospital Course (Signed)
[name] is a 67 y.o. F with dementia, lives at home, wheelchair dependent, hx TBI with focal epilepsy, recurrent UTIs and VT as well as recent [place] who presented with an unresponsive episode.  Patient was sleeping, and when husband went to wake her, she was unresponsive and blue.  EMS arrived, found her SpO2 70% and performed bag valve mask ventilation for 20 minutes then transitioned to NRB. In the ER, she was able to transition to normal nasal cannula.

## 2023-02-03 NOTE — Assessment & Plan Note (Signed)
EEG normal - Continue Onfi and Depakote

## 2023-02-03 NOTE — Assessment & Plan Note (Signed)
Possible Unclear cause, chest imaging clear.  CTA ruled out PE. - Continue supplemental O2 - Pulmonary toilet

## 2023-02-03 NOTE — Assessment & Plan Note (Signed)
-   Continue Rhythmol SR

## 2023-02-03 NOTE — Assessment & Plan Note (Signed)
Due to hypoxia?  No further events.  Renal function normal, EEG unremarkable, no signs of infection.   - Monitor on tele - Follow echo

## 2023-02-03 NOTE — Progress Notes (Signed)
PHARMACY - PHYSICIAN COMMUNICATION CRITICAL VALUE ALERT - BLOOD CULTURE IDENTIFICATION (BCID)  [name] is an 67 y.o. female who presented to [facility] on 02/02/2023 with a chief complaint of unresponsiveness  Assessment: 57 YOF recent COVID infection, hx seizure disorder, with AMS/hypoxia and receiving infectious work-up. Now with 1 of 4 blood cultures growing GPC with BCID detecting MRSE. Seems most likely consistent with contamination.  Name of physician (or Provider) Contacted: [place]  Current antibiotics: Vancomycin standing orders, s/p Cefepime x 1  Changes to prescribed antibiotics recommended:  D/c Vancomycin - monitor off antibiotics per discussion with MD  Results for orders placed or performed during the hospital encounter of 02/02/23  Blood Culture ID Panel (Reflexed) (Collected: 02/02/2023  1:22 PM)  Result Value Ref Range   Enterococcus faecalis NOT DETECTED NOT DETECTED   Enterococcus Faecium NOT DETECTED NOT DETECTED   Listeria monocytogenes NOT DETECTED NOT DETECTED   Staphylococcus species DETECTED (A) NOT DETECTED   Staphylococcus aureus (BCID) NOT DETECTED NOT DETECTED   Staphylococcus epidermidis DETECTED (A) NOT DETECTED   Staphylococcus lugdunensis NOT DETECTED NOT DETECTED   Streptococcus species NOT DETECTED NOT DETECTED   Streptococcus agalactiae NOT DETECTED NOT DETECTED   Streptococcus pneumoniae NOT DETECTED NOT DETECTED   Streptococcus pyogenes NOT DETECTED NOT DETECTED   A.calcoaceticus-baumannii NOT DETECTED NOT DETECTED   Bacteroides fragilis NOT DETECTED NOT DETECTED   Enterobacterales NOT DETECTED NOT DETECTED   Enterobacter cloacae complex NOT DETECTED NOT DETECTED   Escherichia coli NOT DETECTED NOT DETECTED   Klebsiella aerogenes NOT DETECTED NOT DETECTED   Klebsiella oxytoca NOT DETECTED NOT DETECTED   Klebsiella pneumoniae NOT DETECTED NOT DETECTED   Proteus species NOT DETECTED NOT DETECTED   Salmonella species NOT DETECTED NOT  DETECTED   Serratia marcescens NOT DETECTED NOT DETECTED   Haemophilus influenzae NOT DETECTED NOT DETECTED   Neisseria meningitidis NOT DETECTED NOT DETECTED   Pseudomonas aeruginosa NOT DETECTED NOT DETECTED   Stenotrophomonas maltophilia NOT DETECTED NOT DETECTED   Candida albicans NOT DETECTED NOT DETECTED   Candida auris NOT DETECTED NOT DETECTED   Candida glabrata NOT DETECTED NOT DETECTED   Candida krusei NOT DETECTED NOT DETECTED   Candida parapsilosis NOT DETECTED NOT DETECTED   Candida tropicalis NOT DETECTED NOT DETECTED   Cryptococcus neoformans/gattii NOT DETECTED NOT DETECTED   Methicillin resistance mecA/C DETECTED (A) NOT DETECTED   Thank you for allowing pharmacy to be a part of this patient's care.  Alycia Rossetti, PharmD, BCPS Infectious Diseases Clinical Pharmacist 02/03/2023 8:09 AM   **Pharmacist phone directory can now be found on [email or web address] (PW [id number]).  Listed under [place].

## 2023-02-03 NOTE — TOC Initial Note (Signed)
Transition of Care [facility]) - Initial/Assessment Note    Patient Details  Name: [name] MRN: [id number] Date of Birth: [birth year]  [facility] CM/SW Contact:    Curlene Labrum, RN Phone Number: 02/03/2023, 2:39 PM  Clinical Narrative:                 CM met with the patient and husband at the bedside for Transitions of care assessment for home needs.  The patient lives at the home with the patient's husband.  I called [facility] home [facility] and the patient is currently active for PT, OT, ST, aide.  RN was added considering patient with sacral wound per WOC RN.  The patient will need air mattress per WOC RN recommendations.  I spoke with the husband and he plans to order one privately.  Home oxygen will be needed.  Medicare choice for DME provided to the patient's husband and he did not have a preference.  I called [employer] and the plan to deliver home oxygen and will coordinate with the patient's husband.  The patient will need PTAR to home once medically stable for discharge.  Expected Discharge Plan: [place] Barriers to Discharge: Continued Medical Work up   Patient Goals and CMS Choice Patient states their goals for this hospitalization and ongoing recovery are:: To return home CMS Medicare.gov Compare Post Acute Care list provided to:: Patient Represenative (must comment) (Patient's husband) Choice offered to / list presented to : [place] ownership interest in [facility].provided to:: Spouse    Expected Discharge Plan and Services   Discharge Planning Services: CM Consult Post Acute Care Choice: Durable Medical Equipment, Home Health Living arrangements for the past 2 months: Single Family Home                 DME Arranged: Oxygen DME Agency: [employer] Date DME Agency Contacted: 02/03/23 Time DME Agency Contacted: [phone] Representative spoke with at DME Agency: Cyril Mourning, [place] with [facility] Arranged: RN, PT, OT,  Speech Therapy, Nurse's Aide [place] Agency: [place] Date [place]: 02/03/23 Time [place]: 1437 Representative spoke with at [place]: Otila Kluver, [place] with [facility]  Prior Living Arrangements/Services Living arrangements for the past 2 months: [place] with:: Spouse Patient language and need for interpreter reviewed:: Yes Do you feel safe going back to the place where you live?: Yes      Need for Family Participation in Patient Care: Yes (Comment) Care giver support system in place?: Yes (comment) Current home services:  (RW, Cane, 3:1, Rolator, WC, Hospital bed, stair lift, floor lift, stedy, car lift -) Criminal Activity/Legal Involvement Pertinent to Current Situation/Hospitalization: No - Comment as needed  Activities of Daily Living      Permission Sought/Granted Permission sought to share information with : Case Manager, Family Supports, Chartered certified accountant granted to share information with : Yes, Verbal Permission Granted     Permission granted to share info w AGENCY: [place] - continued home health services for PT, OT, ST, aide - adding RN,  [employer] called for home oxygen needs  Permission granted to share info w Relationship: spouse     Emotional Assessment Appearance:: Appears stated age Attitude/Demeanor/Rapport: Unable to Assess Affect (typically observed): Unable to Assess Orientation: :  (Unable to assess - patient easily arousable with verbal communciation) Alcohol / Substance Use: Not Applicable Psych Involvement: No (comment)  Admission diagnosis:  Acute respiratory failure (  [place]) [J96.00] Loss of consciousness ([place]) [R40.20] Failure to thrive in adult [R62.7] Patient Active Problem List   Diagnosis Date Noted   Hypomagnesemia 02/03/2023   Failure to thrive in adult 02/03/2023   Syncope and collapse 02/02/2023   History of epilepsy 02/02/2023   Hypothyroidism 02/02/2023    COVID-19 02/02/2023   Disorientation 01/18/2023   Altered mental status 01/18/2023   Sepsis secondary to UTI ([place]) 01/11/2023   Dementia without behavioral disturbance ([place]) 01/11/2023   Sepsis ([place]) 01/11/2023   Hyponatremia 01/11/2023   Acute respiratory failure ([place]) 25/42/7062   Acute metabolic encephalopathy 37/62/8315   AKI (acute kidney injury) ([place]) 08/20/2022   Hip fracture ([place]) 09/06/2019   Seizure disorder ([place])    History of VT (ventricular tachycardia) ([place]) 04/29/2017   Atypical chest pain 04/15/2017   Localization-related (focal) (partial) symptomatic epilepsy and epileptic syndromes with complex partial seizures, not intractable, without status epilepticus ([place]) 02/10/2016   PCP:  Lurline Del, DO Pharmacy:   [id number] [place] [id number]: [phone]: [id number]: [phone]: [phone]    Social Determinants of Health (SDOH) Social History: SDOH Screenings   Food Insecurity: No Food Insecurity (01/12/2023)  Housing: Low Risk  (01/12/2023)  Transportation Needs: No Transportation Needs (01/12/2023)  Utilities: Not At Risk (01/12/2023)  Tobacco Use: Low Risk  (02/02/2023)   SDOH Interventions:     Readmission Risk Interventions    02/03/2023    2:39 PM 08/24/2022   11:18 AM  Readmission Risk Prevention Plan  Post Dischage Appt  Complete  Medication Screening  Complete  Transportation Screening Complete Complete  PCP or Specialist Appt within 3-5 Days Complete   [place] or [place]   Social Work Consult for [place]. Clement[place]anning/Counseling Complete   Palliative Care Screening Complete   Medication Review (Press photographer Complete

## 2023-02-03 NOTE — Progress Notes (Signed)
Echocardiogram 2D Echocardiogram has been performed.  Oneal Deputy Malasha Kleppe RDCS 02/03/2023, 2:56 PM

## 2023-02-03 NOTE — Procedures (Signed)
Patient Name: [name]  MRN: [id number]  Epilepsy Attending: Lora Havens  Referring Physician/Provider: Toy Baker, MD  Date: 02/02/2023 Duration: 29.46 mins  Patient history: 67yo F with syncope. EEG to evaluate for seizure  Level of alertness: Awake  AEDs during EEG study: Onfi, VPA  Technical aspects: This EEG study was done with scalp electrodes positioned according to the 10-20 International system of electrode placement. Electrical activity was reviewed with band pass filter of 1-'70Hz'$ , sensitivity of 7 uV/mm, display speed of 32m/sec with a '60Hz'$  notched filter applied as appropriate. EEG data were recorded continuously and digitally stored.  Video monitoring was available and reviewed as appropriate.  Description: The posterior dominant rhythm consists of 8 Hz activity of moderate voltage (25-35 uV) seen predominantly in posterior head regions, symmetric and reactive to eye opening and eye closing. Sleep was characterized by vertex waves, sleep spindles (12 to 14 Hz), maximal frontocentral region. EEG showed intermittent generalized 3 to 6 Hz theta-delta slowing. Physiologic photic driving was not seen during photic stimulation. Hyperventilation was not performed.      ABNORMALITY - Intermittent slow, generalized   IMPRESSION: This study is suggestive of mild diffuse encephalopathy, nonspecific etiology. No seizures or epileptiform discharges were seen throughout the recording.   Alyssamae Klinck OBarbra Sarks

## 2023-02-03 NOTE — ED Notes (Signed)
ED TO INPATIENT HANDOFF REPORT  ED Nurse Name and Phone #: Duanne Guess [phone]  S Name/Age/Gender [name] 67 y.o. female Room/Bed: 006C/006C  Code Status   Code Status: Full Code  Triage Complete: Triage complete  Chief Complaint Acute respiratory failure ([place]) [J96.00] Failure to thrive in adult [R62.7]  Triage Note Pt BIB [facility] from [facility]/t family noted she became unresponsive & appeared blue. Husband states her spO2 home monitor said she was in the 49's, fire arrived & reports the same. EMS arrived & BVM was used for approx. 20 minutes then she was transitioned to  NRB. CBG 102, 116/60, 84 bpm, confused at baseline with Hx of dementia. Husband denies any witnessed seizure. 20g Lt hand & 22g Rt hand PIV.    Allergies No Known Allergies  Level of Care/Admitting Diagnosis ED Disposition     ED Disposition  Admit   Condition  --   Comment  Hospital Area: [facility] [[id number]]  Level of Care: Med-Surg [16]  May admit patient to [facility] or [facility] if equivalent level of care is available:: No  Covid Evaluation: Recent COVID positive no isolation required infection day 21-90  Diagnosis: Failure to thrive in adult [358490]  Admitting Physician: Edwin Dada [[id number]]  Attending Physician: Edwin Dada [[id number]]  Certification:: I certify this patient will need inpatient services for at least 2 midnights          B Medical/Surgery History Past Medical History:  Diagnosis Date   Arthritis lower back/hands   Brain injury ([place])    Cancer ([place]) breast tumor removed   Dementia ([place])    PSD-FTD   Leg fracture    Seizure disorder ([place])    Seizures ([place])    VT (ventricular tachycardia) ([place]) 04/29/2017   Past Surgical History:  Procedure Laterality Date   BREAST SURGERY     FACIAL FRACTURE SURGERY  orbital bone repair   LEFT HEART CATH AND CORONARY ANGIOGRAPHY N/A 04/30/2017   Procedure: Left Heart Cath and Coronary  Angiography;  Surgeon: Leonie Man, MD;  Location: [facility];  Service: Cardiovascular;  Laterality: N/A;   TOTAL HIP ARTHROPLASTY Left 09/07/2019   Procedure: TOTAL HIP ARTHROPLASTY ANTERIOR APPROACH;  Surgeon: Rod Can, MD;  Location: [facility];  Service: Orthopedics;  Laterality: Left;     A IV Location/Drains/Wounds Patient Lines/Drains/Airways Status     Active Line/Drains/Airways     Name Placement date Placement time Site Days   Peripheral IV 02/02/23 20 G Anterior;Left Hand 02/02/23  1324  Hand  1   Peripheral IV 02/02/23 22 G Anterior;Right Hand 02/02/23  1324  Hand  1   Peripheral IV 02/02/23 20 G Left Antecubital 02/02/23  1431  Antecubital  1   External Urinary Catheter 02/03/23  0100  --  less than 1            Intake/Output Last 24 hours  Intake/Output Summary (Last 24 hours) at 02/03/2023 1022 Last data filed at 02/03/2023 0944 Gross per 24 hour  Intake 1924.62 ml  Output --  Net 1924.62 ml    Labs/Imaging Results for orders placed or performed during the hospital encounter of 02/02/23 (from the past 48 hour(s))  CBC with Differential     Status: Abnormal   Collection Time: 02/02/23 12:54 PM  Result Value Ref Range   WBC 4.8 4.0 - 10.5 K/uL   RBC 3.71 (L) 3.87 - 5.11 MIL/uL   Hemoglobin 12.4 12.0 - 15.0  g/dL   HCT 37.2 36.0 - 46.0 %   MCV 100.3 (H) 80.0 - 100.0 fL   MCH 33.4 26.0 - 34.0 pg   MCHC 33.3 30.0 - 36.0 g/dL   RDW 16.2 (H) 11.5 - 15.5 %   Platelets 157 150 - 400 K/uL   nRBC 0.0 0.0 - 0.2 %   Neutrophils Relative % 61 %   Neutro Abs 2.9 1.7 - 7.7 K/uL   Lymphocytes Relative 25 %   Lymphs Abs 1.2 0.7 - 4.0 K/uL   Monocytes Relative 13 %   Monocytes Absolute 0.6 0.1 - 1.0 K/uL   Eosinophils Relative 1 %   Eosinophils Absolute 0.1 0.0 - 0.5 K/uL   Basophils Relative 0 %   Basophils Absolute 0.0 0.0 - 0.1 K/uL   Immature Granulocytes 0 %   Abs Immature Granulocytes 0.02 0.00 - 0.07 K/uL    Comment: Performed at [facility] [address] [place], [place] [address]  Comprehensive metabolic panel     Status: Abnormal   Collection Time: 02/02/23 12:54 PM  Result Value Ref Range   Sodium 136 135 - 145 mmol/L   Potassium 4.1 3.5 - 5.1 mmol/L   Chloride 98 98 - 111 mmol/L   CO2 29 22 - 32 mmol/L   Glucose, Bld 86 70 - 99 mg/dL    Comment: Glucose reference range applies only to samples taken after fasting for at least 8 hours.   BUN 22 8 - 23 mg/dL   Creatinine, Ser 0.65 0.44 - 1.00 mg/dL   Calcium 8.3 (L) 8.9 - 10.3 mg/dL   Total Protein 5.1 (L) 6.5 - 8.1 g/dL   Albumin 1.9 (L) 3.5 - 5.0 g/dL   AST 18 15 - 41 U/L   ALT 11 0 - 44 U/L   Alkaline Phosphatase 48 38 - 126 U/L   Total Bilirubin 0.3 0.3 - 1.2 mg/dL   GFR, Estimated >60 >60 mL/min    Comment: (NOTE) Calculated using the CKD-EPI Creatinine Equation (2021)    Anion gap 9 5 - 15    Comment: Performed at [facility], [place] [address], [place], [place] [address]  Valproic acid level     Status: None   Collection Time: 02/02/23 12:54 PM  Result Value Ref Range   Valproic Acid Lvl 75 50.0 - 100.0 ug/mL    Comment: Performed at [facility] [address] [place], [place] [address]  Lactic acid, plasma     Status: None   Collection Time: 02/02/23 12:54 PM  Result Value Ref Range   Lactic Acid, Venous 0.8 0.5 - 1.9 mmol/L    Comment: Performed at [facility] [address], [place], [place] [address]  TSH     Status: Abnormal   Collection Time: 02/02/23 12:54 PM  Result Value Ref Range   TSH 8.545 (H) 0.350 - 4.500 uIU/mL    Comment: Performed by a 3rd Generation assay with a functional sensitivity of <=0.01 uIU/mL. Performed at [facility], [place] [address], [place], [place] [address]   Resp panel by RT-PCR (RSV, Flu A&B, Covid) Anterior Nasal Swab     Status: Abnormal   Collection Time: 02/02/23 12:57 PM   Specimen: Anterior Nasal Swab  Result Value Ref Range   SARS Coronavirus 2 by RT PCR POSITIVE (A)  NEGATIVE   Influenza A by PCR NEGATIVE NEGATIVE   Influenza B by PCR NEGATIVE NEGATIVE  Comment: (NOTE) The Xpert Xpress SARS-CoV-2/FLU/RSV plus assay is intended as an aid in the diagnosis of influenza from Nasopharyngeal swab specimens and should not be used as a sole basis for treatment. Nasal washings and aspirates are unacceptable for Xpert Xpress SARS-CoV-2/FLU/RSV testing.  Fact Sheet for Patients: EntrepreneurPulse.com.au  Fact Sheet for Healthcare Providers: IncredibleEmployment.be  This test is not yet approved or cleared by the [place] FDA and has been authorized for detection and/or diagnosis of SARS-CoV-2 by FDA under an Emergency Use Authorization (EUA). This EUA will remain in effect (meaning this test can be used) for the duration of the COVID-19 declaration under Section 564(b)(1) of the Act, 21 U.S.C. section 360bbb-3(b)(1), unless the authorization is terminated or revoked.     Resp Syncytial Virus by PCR NEGATIVE NEGATIVE    Comment: (NOTE) Fact Sheet for Patients: EntrepreneurPulse.com.au  Fact Sheet for Healthcare Providers: IncredibleEmployment.be  This test is not yet approved or cleared by the [place] FDA and has been authorized for detection and/or diagnosis of SARS-CoV-2 by FDA under an Emergency Use Authorization (EUA). This EUA will remain in effect (meaning this test can be used) for the duration of the COVID-19 declaration under Section 564(b)(1) of the Act, 21 U.S.C. section 360bbb-3(b)(1), unless the authorization is terminated or revoked.  Performed at [facility], [place] [address] [place], [place] [address]   Blood culture (routine x 2)     Status: None (Preliminary result)   Collection Time: 02/02/23  1:10 PM   Specimen: BLOOD RIGHT FOREARM  Result Value Ref Range   Specimen Description BLOOD RIGHT FOREARM    Special Requests      BOTTLES  DRAWN AEROBIC AND ANAEROBIC Blood Culture results may not be optimal due to an inadequate volume of blood received in culture bottles   Culture      NO GROWTH < 24 HOURS Performed at [facility], [place] [address] [place], [place] [address]    Report Status PENDING   I-stat chem 8, ED (not at [facility], [facility] or [facility]     Status: Abnormal   Collection Time: 02/02/23  1:13 PM  Result Value Ref Range   Sodium 136 135 - 145 mmol/L   Potassium 4.8 3.5 - 5.1 mmol/L   Chloride 98 98 - 111 mmol/L   BUN 31 (H) 8 - 23 mg/dL   Creatinine, Ser 0.50 0.44 - 1.00 mg/dL   Glucose, Bld 92 70 - 99 mg/dL    Comment: Glucose reference range applies only to samples taken after fasting for at least 8 hours.   Calcium, Ion 1.04 (L) 1.15 - 1.40 mmol/L   TCO2 30 22 - 32 mmol/L   Hemoglobin 11.9 (L) 12.0 - 15.0 g/dL   HCT 35.0 (L) 36.0 - 46.0 %  I-Stat venous blood gas, ([facility], [facility], [facility])     Status: Abnormal   Collection Time: 02/02/23  1:14 PM  Result Value Ref Range   pH, Ven 7.539 (H) 7.25 - 7.43   pCO2, Ven 41.9 (L) 44 - 60 mmHg   pO2, Ven 64 (H) 32 - 45 mmHg   Bicarbonate 35.7 (H) 20.0 - 28.0 mmol/L   TCO2 37 (H) 22 - 32 mmol/L   O2 Saturation 94 %   Acid-Base Excess 12.0 (H) 0.0 - 2.0 mmol/L   Sodium 136 135 - 145 mmol/L   Potassium 4.8 3.5 - 5.1 mmol/L   Calcium, Ion 1.05 (L) 1.15 - 1.40 mmol/L   HCT 37.0  36.0 - 46.0 %   Hemoglobin 12.6 12.0 - 15.0 g/dL   Sample type VENOUS   Blood culture (routine x 2)     Status: None (Preliminary result)   Collection Time: 02/02/23  1:22 PM   Specimen: BLOOD RIGHT FOREARM  Result Value Ref Range   Specimen Description BLOOD RIGHT FOREARM    Special Requests      BOTTLES DRAWN AEROBIC AND ANAEROBIC Blood Culture adequate volume   Culture  Setup Time      GRAM POSITIVE COCCI ANAEROBIC BOTTLE ONLY Organism ID to follow CRITICAL RESULT CALLED TO, READ BACK BY AND VERIFIED WITHFerne Coe, PHARMD, AT 1916 02/03/23 BY Rush Landmark Performed at [facility], [place] [address] [place], [place] [address]    Culture GRAM POSITIVE COCCI    Report Status PENDING   Blood Culture ID Panel (Reflexed)     Status: Abnormal   Collection Time: 02/02/23  1:22 PM  Result Value Ref Range   Enterococcus faecalis NOT DETECTED NOT DETECTED   Enterococcus Faecium NOT DETECTED NOT DETECTED   Listeria monocytogenes NOT DETECTED NOT DETECTED   Staphylococcus species DETECTED (A) NOT DETECTED    Comment: CRITICAL RESULT CALLED TO, READ BACK BY AND VERIFIED WITH: EHassell Done, PHARMD, AT 0750 02/03/23 BY D. VANHOOK    Staphylococcus aureus (BCID) NOT DETECTED NOT DETECTED   Staphylococcus epidermidis DETECTED (A) NOT DETECTED    Comment: Methicillin (oxacillin) resistant coagulase negative staphylococcus. Possible blood culture contaminant (unless isolated from more than one blood culture draw or clinical case suggests pathogenicity). No antibiotic treatment is indicated for blood  culture contaminants. CRITICAL RESULT CALLED TO, READ BACK BY AND VERIFIED WITH: Ferne Coe, PHARMD, AT 0750 02/03/23 BY D. VANHOOK    Staphylococcus lugdunensis NOT DETECTED NOT DETECTED   Streptococcus species NOT DETECTED NOT DETECTED   Streptococcus agalactiae NOT DETECTED NOT DETECTED   Streptococcus pneumoniae NOT DETECTED NOT DETECTED   Streptococcus pyogenes NOT DETECTED NOT DETECTED   A.calcoaceticus-baumannii NOT DETECTED NOT DETECTED   Bacteroides fragilis NOT DETECTED NOT DETECTED   Enterobacterales NOT DETECTED NOT DETECTED   Enterobacter cloacae complex NOT DETECTED NOT DETECTED   Escherichia coli NOT DETECTED NOT DETECTED   Klebsiella aerogenes NOT DETECTED NOT DETECTED   Klebsiella oxytoca NOT DETECTED NOT DETECTED   Klebsiella pneumoniae NOT DETECTED NOT DETECTED   Proteus species NOT DETECTED NOT DETECTED   Salmonella species NOT DETECTED NOT DETECTED   Serratia marcescens NOT DETECTED NOT DETECTED   Haemophilus influenzae NOT DETECTED NOT DETECTED   Neisseria  meningitidis NOT DETECTED NOT DETECTED   Pseudomonas aeruginosa NOT DETECTED NOT DETECTED   Stenotrophomonas maltophilia NOT DETECTED NOT DETECTED   Candida albicans NOT DETECTED NOT DETECTED   Candida auris NOT DETECTED NOT DETECTED   Candida glabrata NOT DETECTED NOT DETECTED   Candida krusei NOT DETECTED NOT DETECTED   Candida parapsilosis NOT DETECTED NOT DETECTED   Candida tropicalis NOT DETECTED NOT DETECTED   Cryptococcus neoformans/gattii NOT DETECTED NOT DETECTED   Methicillin resistance mecA/C DETECTED (A) NOT DETECTED    Comment: CRITICAL RESULT CALLED TO, READ BACK BY AND VERIFIED WITHFerne Coe, PHARMD, AT 0750 02/03/23 BY Rush Landmark Performed at [facility], [address] [place], [place] [address]   Lactic acid, plasma     Status: None   Collection Time: 02/02/23  4:26 PM  Result Value Ref Range   Lactic Acid, Venous 0.9 0.5 - 1.9 mmol/L    Comment:  Performed at [facility], [place] [address], [place], [place] [address]  Troponin I (High Sensitivity)     Status: None   Collection Time: 02/02/23  4:26 PM  Result Value Ref Range   Troponin I (High Sensitivity) 4 <18 ng/L    Comment: (NOTE) Elevated high sensitivity troponin I (hsTnI) values and significant  changes across serial measurements may suggest ACS but many other  chronic and acute conditions are known to elevate hsTnI results.  Refer to the "Links" section for chest pain algorithms and additional  guidance. Performed at [facility], [place] [address] [place], [place] [address]   T4, free     Status: Abnormal   Collection Time: 02/02/23  4:26 PM  Result Value Ref Range   Free T4 1.31 (H) 0.61 - 1.12 ng/dL    Comment: (NOTE) Biotin ingestion may interfere with free T4 tests. If the results are inconsistent with the TSH level, previous test results, or the clinical presentation, then consider biotin interference. If needed, order repeat testing after stopping biotin. Performed at [facility], [place] [address], [place], [place] [address]   Ammonia     Status: None   Collection Time: 02/02/23  5:30 PM  Result Value Ref Range   Ammonia 20 9 - 35 umol/L    Comment: Performed at [facility], [place] [address], [place], [place] [address]  Troponin I (High Sensitivity)     Status: None   Collection Time: 02/02/23  5:30 PM  Result Value Ref Range   Troponin I (High Sensitivity) 5 <18 ng/L    Comment: (NOTE) Elevated high sensitivity troponin I (hsTnI) values and significant  changes across serial measurements may suggest ACS but many other  chronic and acute conditions are known to elevate hsTnI results.  Refer to the "Links" section for chest pain algorithms and additional  guidance. Performed at [facility], [place] [address], [place], [place] [address]   MRSA Next Gen by PCR, Nasal     Status: None   Collection Time: 02/02/23  5:57 PM   Specimen: Nasal Mucosa; Nasal Swab  Result Value Ref Range   MRSA by PCR Next Gen NOT DETECTED NOT DETECTED    Comment: (NOTE) The GeneXpert MRSA Assay (FDA approved for NASAL specimens only), is one component of a comprehensive MRSA colonization surveillance program. It is not intended to diagnose MRSA infection nor to guide or monitor treatment for MRSA infections. Test performance is not FDA approved in patients less than 53 years old. Performed at [facility], [place] [address] [place], [place] [address]   CK     Status: Abnormal   Collection Time: 02/03/23  4:27 AM  Result Value Ref Range   Total CK 36 (L) 38 - 234 U/L    Comment: Performed at [facility], [place] [address] [place], [place] [address]  Magnesium     Status: Abnormal   Collection Time: 02/03/23  4:27 AM  Result Value Ref Range   Magnesium 1.6 (L) 1.7 - 2.4 mg/dL    Comment: Performed at [facility] [address] [place], [place] [address]  Phosphorus     Status: None   Collection Time: 02/03/23  4:27 AM  Result Value Ref  Range   Phosphorus 3.3 2.5 - 4.6 mg/dL    Comment: Performed at [facility] [address] [place], [place] [address]  Hepatic function panel  Status: Abnormal   Collection Time: 02/03/23  4:27 AM  Result Value Ref Range   Total Protein 5.0 (L) 6.5 - 8.1 g/dL   Albumin 1.7 (L) 3.5 - 5.0 g/dL   AST 17 15 - 41 U/L   ALT 10 0 - 44 U/L   Alkaline Phosphatase 45 38 - 126 U/L   Total Bilirubin 0.4 0.3 - 1.2 mg/dL   Bilirubin, Direct 0.2 0.0 - 0.2 mg/dL   Indirect Bilirubin 0.2 (L) 0.3 - 0.9 mg/dL    Comment: Performed at [facility] [address] [place], [place] [address]  CBC     Status: Abnormal   Collection Time: 02/03/23  4:27 AM  Result Value Ref Range   WBC 5.0 4.0 - 10.5 K/uL   RBC 3.61 (L) 3.87 - 5.11 MIL/uL   Hemoglobin 11.7 (L) 12.0 - 15.0 g/dL   HCT 37.7 36.0 - 46.0 %   MCV 104.4 (H) 80.0 - 100.0 fL   MCH 32.4 26.0 - 34.0 pg   MCHC 31.0 30.0 - 36.0 g/dL   RDW 15.6 (H) 11.5 - 15.5 %   Platelets 123 (L) 150 - 400 K/uL   nRBC 0.0 0.0 - 0.2 %    Comment: Performed at [facility], [place] [address] [place], [place] [address]  Comprehensive metabolic panel     Status: Abnormal   Collection Time: 02/03/23  4:27 AM  Result Value Ref Range   Sodium 133 (L) 135 - 145 mmol/L   Potassium 4.4 3.5 - 5.1 mmol/L   Chloride 97 (L) 98 - 111 mmol/L   CO2 27 22 - 32 mmol/L   Glucose, Bld 145 (H) 70 - 99 mg/dL    Comment: Glucose reference range applies only to samples taken after fasting for at least 8 hours.   BUN 17 8 - 23 mg/dL   Creatinine, Ser 0.46 0.44 - 1.00 mg/dL   Calcium 8.0 (L) 8.9 - 10.3 mg/dL   Total Protein 4.9 (L) 6.5 - 8.1 g/dL   Albumin 1.7 (L) 3.5 - 5.0 g/dL   AST 19 15 - 41 U/L   ALT 10 0 - 44 U/L   Alkaline Phosphatase 44 38 - 126 U/L   Total Bilirubin 0.3 0.3 - 1.2 mg/dL   GFR, Estimated >60 >60 mL/min    Comment: (NOTE) Calculated using the CKD-EPI Creatinine Equation (2021)    Anion gap 9 5 - 15    Comment: Performed at [facility], [place] [address] [place], [place] [address]   EEG adult  Result Date: 02/03/2023 Lora Havens, MD     02/03/2023  9:01 AM Patient Name: JENNIFR GAETA MRN: [id number] Epilepsy Attending: Lora Havens Referring Physician/Provider: Toy Baker, MD Date: 02/02/2023 Duration: 29.46 mins Patient history: 67yo F with syncope. EEG to evaluate for seizure Level of alertness: Awake AEDs during EEG study: Onfi, VPA Technical aspects: This EEG study was done with scalp electrodes positioned according to the 10-20 International system of electrode placement. Electrical activity was reviewed with band pass filter of 1-'70Hz'$ , sensitivity of 7 uV/mm, display speed of 62m/sec with a '60Hz'$  notched filter applied as appropriate. EEG data were recorded continuously and digitally stored.  Video monitoring was available and reviewed as appropriate. Description: The posterior dominant rhythm consists of 8 Hz activity of moderate voltage (25-35 uV) seen predominantly in posterior head regions, symmetric and reactive to eye opening and eye closing. Sleep was characterized by vertex waves,  sleep spindles (12 to 14 Hz), maximal frontocentral region. EEG showed intermittent generalized 3 to 6 Hz theta-delta slowing. Physiologic photic driving was not seen during photic stimulation. Hyperventilation was not performed.    ABNORMALITY - Intermittent slow, generalized  IMPRESSION: This study is suggestive of mild diffuse encephalopathy, nonspecific etiology. No seizures or epileptiform discharges were seen throughout the recording.  Lora Havens   CT ABDOMEN PELVIS W CONTRAST  Result Date: 02/02/2023 CLINICAL DATA:  Abdominal pain. EXAM: CT ABDOMEN AND PELVIS WITH CONTRAST TECHNIQUE: Multidetector CT imaging of the abdomen and pelvis was performed using the standard protocol following bolus administration of intravenous contrast. RADIATION DOSE REDUCTION: This exam was performed according to the departmental  dose-optimization program which includes automated exposure control, adjustment of the mA and/or kV according to patient size and/or use of iterative reconstruction technique. CONTRAST:  42m OMNIPAQUE IOHEXOL 350 MG/ML SOLN COMPARISON:  Right upper quadrant ultrasound 01/17/2023, remote CT 07/04/2009 Performed in conjunction with CTA of the chest, reported separately. FINDINGS: Lower chest: Assessed fully on concurrent chest CT, reported separately. There is moderate motion artifact in the included lung bases. Hepatobiliary: Mild subjective hepatic steatosis. No evidence of focal liver lesion. Gallbladder physiologically distended, no calcified stone. No biliary dilatation. Pancreas: No ductal dilatation or inflammation. Spleen: Normal in size without focal abnormality. Adrenals/Urinary Tract: Normal adrenal glands. No hydronephrosis or perinephric edema. No renal calculi or suspicious renal lesion. Urinary bladder is partially distended, small cystocele. Portions of the bladder obscured by left hip arthroplasty. Stomach/Bowel: No bowel obstruction or inflammation. Moderate colonic stool burden. Occasional colonic diverticula without diverticulitis. Portions of normal appendix are visualized, no appendicitis. Vascular/Lymphatic: Aortic atherosclerosis without aneurysm. No acute vascular findings. No adenopathy. Reproductive: Uterus and bilateral adnexa are unremarkable. Other: No ascites or free air. Small fat containing umbilical hernia. Musculoskeletal: Mild T10, L1 and L2 superior endplate compression deformities which appear chronic. Prominent Schmorl's node within L4 superior endplate. Mild multilevel degenerative disc disease with moderate lower lumbar facet hypertrophy. Remote lower sternal body fracture. Left hip arthroplasty. No acute osseous findings. IMPRESSION: 1. No acute abnormality in the abdomen/pelvis. 2. Moderate colonic stool burden, can be seen with constipation. 3. Mild hepatic steatosis. 4.  Small cystocele. Aortic Atherosclerosis (ICD10-I70.0). Electronically Signed   By: MKeith RakeM.D.   On: 02/02/2023 16:17   CT Head Wo Contrast  Result Date: 02/02/2023 CLINICAL DATA:  Altered mental status EXAM: CT HEAD WITHOUT CONTRAST TECHNIQUE: Contiguous axial images were obtained from the base of the skull through the vertex without intravenous contrast. RADIATION DOSE REDUCTION: This exam was performed according to the departmental dose-optimization program which includes automated exposure control, adjustment of the mA and/or kV according to patient size and/or use of iterative reconstruction technique. COMPARISON:  01/11/2023 FINDINGS: Brain: There is no mass, hemorrhage or extra-axial collection. There is generalized atrophy without lobar predilection. Hypodensity of the white matter is most commonly associated with chronic microvascular disease. Vascular: Atherosclerotic calcification of the internal carotid arteries at the skull base. No abnormal hyperdensity of the major intracranial arteries or dural venous sinuses. Skull: The visualized skull base, calvarium and extracranial soft tissues are normal. Sinuses/Orbits: No fluid levels or advanced mucosal thickening of the visualized paranasal sinuses. No mastoid or middle ear effusion. The orbits are normal. IMPRESSION: 1. No acute intracranial abnormality. 2. Generalized atrophy and findings of chronic microvascular disease. Electronically Signed   By: KUlyses JarredM.D.   On: 02/02/2023 16:15   CT Angio Chest PE W and/or  Wo Contrast  Result Date: 02/02/2023 CLINICAL DATA:  Pulmonary embolism suspected. High probability. Mental status change. EXAM: CT ANGIOGRAPHY CHEST WITH CONTRAST TECHNIQUE: Multidetector CT imaging of the chest was performed using the standard protocol during bolus administration of intravenous contrast. Multiplanar CT image reconstructions and MIPs were obtained to evaluate the vascular anatomy. RADIATION DOSE REDUCTION:  This exam was performed according to the departmental dose-optimization program which includes automated exposure control, adjustment of the mA and/or kV according to patient size and/or use of iterative reconstruction technique. CONTRAST:  70m OMNIPAQUE IOHEXOL 350 MG/ML SOLN COMPARISON:  Chest radiography same day.  Prior CT 01/17/2023. FINDINGS: Cardiovascular: Heart size is normal. Pulmonary arterial opacification is good. There are no pulmonary emboli. No acute aortic pathology. Minimal atherosclerotic calcification. Mediastinum/Nodes: No mass or adenopathy. Lungs/Pleura: No pleural effusion. Chronic volume loss in the dependent lower lungs, somewhat improved since the prior exam. Upper Abdomen: No acute finding.  No change. Musculoskeletal: No acute finding. Scattered old superior endplate depressions as seen previously. Review of the MIP images confirms the above findings. IMPRESSION: 1. No pulmonary emboli. 2. Chronic volume loss in the dependent lower lungs, somewhat improved since the prior exam. Electronically Signed   By: MNelson ChimesM.D.   On: 02/02/2023 16:12   DG Chest Portable 1 View  Result Date: 02/02/2023 CLINICAL DATA:  Altered mental status EXAM: PORTABLE CHEST 1 VIEW COMPARISON:  01/19/2023 FINDINGS: Low lung volumes are present, causing crowding of the pulmonary vasculature. The patient is rotated to the left on today's radiograph, reducing diagnostic sensitivity and specificity. Reverse lordotic projection Right perihilar atelectasis or scarring. INSERT central airway thickening Mild retrocardiac scarring or atelectasis in the left lower lobe. No blunting of the costophrenic angles. No significant bony abnormality. IMPRESSION: 1. Airway thickening is present, suggesting bronchitis or reactive airways disease. 2. Right perihilar and left lower lobe atelectasis or scarring. 3. Low lung volumes are present, causing crowding of the pulmonary vasculature. Electronically Signed   By: WVan ClinesM.D.   On: 02/02/2023 13:17    Pending Labs Unresulted Labs (From admission, onward)     Start     Ordered   02/03/23 0630  Procalcitonin  Once,   STAT        02/03/23 0630   02/03/23 0545  Protime-INR  Once,   STAT        02/03/23 0545   02/03/23 0500  Procalcitonin  Daily,   R      02/03/23 0015   02/02/23 2231  Urinalysis, Complete w Microscopic -Urine, Clean Catch  Once,   R       Question Answer Comment  Release to patient Immediate   Specimen Source Urine, Clean Catch      02/02/23 2230   02/02/23 1257  Urinalysis, Routine w reflex microscopic -Urine, Clean Catch  Once,   URGENT       Question:  Specimen Source  Answer:  Urine, Clean Catch   02/02/23 1257            Vitals/Pain Today's Vitals   02/03/23 0545 02/03/23 0600 02/03/23 0615 02/03/23 0631  BP: (!) 104/54 100/64 102/71   Pulse: 70 69 71   Resp: 20 (!) 21 20   Temp:    (!) 97.1 F (36.2 C)  TempSrc:    Temporal  SpO2: 97% 94% 95%   PainSc:        Isolation Precautions Airborne and Contact precautions  Medications Medications  cloBAZam (ONFI) tablet 10  mg (10 mg Oral Not Given 02/03/23 1017)  divalproex (DEPAKOTE ER) 24 hr tablet 1,000 mg (1,000 mg Oral Given 02/03/23 0230)  polyethylene glycol (MIRALAX / GLYCOLAX) packet 17 g (has no administration in time range)  propafenone (RYTHMOL SR) 12 hr capsule 225 mg (225 mg Oral Given 02/03/23 0231)  acetaminophen (TYLENOL) tablet 650 mg (has no administration in time range)    Or  acetaminophen (TYLENOL) suppository 650 mg (has no administration in time range)  HYDROcodone-acetaminophen (NORCO/VICODIN) 5-325 MG per tablet 1-2 tablet (has no administration in time range)  levothyroxine (SYNTHROID) tablet 50 mcg (0 mcg Oral Hold 02/03/23 0630)  cloBAZam (ONFI) tablet 15 mg (15 mg Oral Given 02/03/23 0230)  0.9 %  sodium chloride infusion (has no administration in time range)  ceFEPIme (MAXIPIME) 1 g in sodium chloride 0.9 % 100 mL IVPB (0 g Intravenous  Stopped 02/02/23 1651)  lactated ringers bolus 1,000 mL (0 mLs Intravenous Stopped 02/02/23 1748)  iohexol (OMNIPAQUE) 350 MG/ML injection 75 mL (75 mLs Intravenous Contrast Given 02/02/23 1607)  magnesium sulfate IVPB 2 g 50 mL (0 g Intravenous Stopped 02/03/23 0944)    Mobility non-ambulatory     Focused Assessments Pulmonary Assessment Handoff:  Lung sounds: Bilateral Breath Sounds: Clear, Diminished O2 Device: Nasal Cannula O2 Flow Rate (L/min): 2 L/min    R Recommendations: See Admitting Provider Note  Report given to:   Additional Notes:

## 2023-02-03 NOTE — Assessment & Plan Note (Signed)
-   Supplement Mag

## 2023-02-03 NOTE — Assessment & Plan Note (Signed)
See above

## 2023-02-03 NOTE — Progress Notes (Incomplete)
SATURATION QUALIFICATIONS: (This note is used to comply with regulatory documentation for home oxygen)  Patient Saturations on Room Air at Rest = ***%  Patient Saturations on Room Air while Ambulating = ***%  Patient Saturations on *** Liters of oxygen while Ambulating = ***%  Please briefly explain why patient needs home oxygen: 

## 2023-02-03 NOTE — Progress Notes (Signed)
Progress Note   Patient: [name] INO:[id number] DOB: 13-Nov-1956 DOA: 02/02/2023     1 DOS: the patient was seen and examined on 02/03/2023 at 10:10AM      Brief hospital course: Mrs. Kattner is a 67 y.o. F with dementia, lives at home, wheelchair dependent, hx TBI with focal epilepsy, recurrent UTIs and VT as well as recent [place] who presented with an unresponsive episode.  Patient was sleeping, and when husband went to wake her, she was unresponsive and blue.  EMS arrived, found her SpO2 70% and performed bag valve mask ventilation for 20 minutes then transitioned to NRB. In the ER, she was able to transition to normal nasal cannula.     Assessment and Plan: * Failure to thrive in adult - Consult Palliative Care  Acute respiratory failure (HCC) Possible Unclear cause, chest imaging clear.  CTA ruled out PE. - Continue supplemental O2 - Pulmonary toilet  Syncope and collapse Due to hypoxia?  No further events.  Renal function normal, EEG unremarkable, no signs of infection.   - Monitor on tele - Follow echo  COVID-19 >10 days ago.  No further isolation needed.  Acute metabolic encephalopathy At baseline prior to [place], was interactive oriented to self, family, place, could participate in self cares.  Since COVID, she has essentially not opened her eyes, only speaks a few words.  Today, she repeats her name only, no other purposeful responses.    Probably residual encephalopathy from COVID - Standard delirium precautions: blinds open and lights on during day, TV off, minimize interruptions at night, glasses/hearing aids, PT/OT, avoiding Beers list medications    Hypomagnesemia - Supplement Mag  Hypothyroidism - Continue levothyroxine  History of epilepsy See above  Hyponatremia Resolved with fluids   Dementia without behavioral disturbance (HCC) See above  Seizure disorder (HCC) EEG normal - Continue Onfi and Depakote  History of VT (ventricular tachycardia)  (HCC) - Continue Rhythmol SR          Subjective: No fever, no seizures.  No respiratory distress.  No nursing concerns.  Remains weak, poorly responsive.  Intermittently hypoxic here.     Physical Exam: BP 139/78 (BP Location: Left Arm)   Pulse 80   Temp 97.8 F (36.6 C) (Oral)   Resp 18   SpO2 96%   Elderly adult female, appears dehydrated, weak, eyes mostly closed RRR, no murmurs, no peripheral edema Respiratory rate seems normal, frequent apneic episodes were observed while observing her Abdomen soft, no grimace to palpation Mumbles in response to questions, hard of hearing, repeats her name, no other intelligible answers to any other questions, does not follow commands, severe generalized weakness       Data Reviewed: CBC and CMP unremarkable Ammonia normal VBG shows respiratory alkalosis TSH and free T4 both slightly elevated, indeterminate Magnesium low CT abdomen pelvis unremarkable Troponin normal CT angiogram of the chest and chest x-ray both show low volumes, CTA rules out PE CT head no acute findings    Family Communication: Husband at the bedside    Disposition: Status is: Inpatient The patient presented with decreased mentation, hypoxia  She is still requiring supplemental oxygen  Workup to this point, shows no clear reversible cause, and suspect that she is failing to thrive.  Discharged prematurely at this time without safe disposition, would risk unnecessary suffering, or loss of life, as goals of care are being discussed.        Author: Edwin Dada, MD 02/03/2023 4:35 PM  For on call review [email or web address].

## 2023-02-03 NOTE — Assessment & Plan Note (Signed)
>  10 days ago.  No further isolation needed.

## 2023-02-03 NOTE — Assessment & Plan Note (Signed)
At baseline prior to [place], was interactive oriented to self, family, place, could participate in self cares.  Since COVID, she has essentially not opened her eyes, only speaks a few words.  Today, she repeats her name only, no other purposeful responses.    Probably residual encephalopathy from COVID - Standard delirium precautions: blinds open and lights on during day, TV off, minimize interruptions at night, glasses/hearing aids, PT/OT, avoiding Beers list medications

## 2023-02-03 NOTE — Progress Notes (Signed)
    Durable Medical Equipment  (From admission, onward)           Start     Ordered   02/03/23 0824  DME Oxygen  (Discharge Planning)  Once       Question Answer Comment  Length of Need Lifetime   Mode or (Route) Nasal cannula   Liters per Minute 2   Frequency Continuous (stationary and portable oxygen unit needed)   Oxygen delivery system Gas      02/03/23 0823

## 2023-02-03 NOTE — Consult Note (Signed)
WOC Nurse Consult Note: Reason for Consult: multiple wounds, skin tears Patient from home, cared for by husband he is at bedside. Does not have air mattress at home. Using incontinence briefs at home. Nutritionally compromised. I have discussed use of incontinence briefs at home and use of air mattress now that skin is breaking down.  Wound type: Evolving DTPI (deep tissue pressure injury) sacrum Pressure Injury POA: Yes Measurement:9cm x 10cm x 0.1cm area of scattered dark purple non blanchable tissue with superficial skin peeling consistent with Deep Tissue Pressure Injury Wound bed:see above  Drainage (amount, consistency, odor) none Periwound: intact  Dressing procedure/placement/frequency: Low air loss mattress for moisture management and pressure redistribution Purewick in place to manage bladder incontinence Prevalon boots bilaterally to offload heels in high risk patient RD for nutritional supplementation for wound healing Silicone foam for sacral pressure injury; monitor for acute changes. Notify WOC nursing team for any worsening of the wound.   Suggested to CG/husband to contact DME provider that has supplied home hospital bed for air mattress, rationale for not using egg crate mattress overlay (heat, moisture, non adequate pressure redistribution)  Discussed POC with patient and bedside nurse.  Re consult if needed, will not follow at this time. Thanks  Jendaya Gossett [employer], RN,CWOCN, CNS, [place] [phone])

## 2023-02-03 NOTE — ED Notes (Addendum)
Patient took pills with applesauce with extensive coaching.  Patient able to swallow pills without incident.

## 2023-02-03 NOTE — Assessment & Plan Note (Signed)
Resolved with fluids °

## 2023-02-03 NOTE — Progress Notes (Signed)
Pt arrived to 2W with husband at bedside. During admission this nurse witnessed potential pressure injuries not previously noted from ED. Nurse cleaned wounds, applied barrier cream and foam dx. Husband states that he noticed she had been getting what looked like bed sores at home and was switching a pillow back and forth at home. Pt is sleepy at baseline with a history of dementia. WOC consulted for pressure injuries. Admission complete, all needs met at this time, call light within reach.

## 2023-02-03 NOTE — Evaluation (Addendum)
Physical Therapy Evaluation Patient Details Name: [name] MRN: [id number] DOB: [birth year] Today's Date: 02/03/2023  History of Present Illness  Patient is 67 y.o. female presents to [facility] on Recent admission on 1/15 with weakness, worsening mental status secondary to UTI sepsis. PMH includes dementia,  VT, focal seizures, recurrent UTIs, L THA, L TKA.   Clinical Impression  [name] is 67 y.o. female admitted with above HPI and diagnosis. Patient is currently limited by functional impairments below (see PT problem list). Patient lives with her spouse and has been declining gradually with mobility and cognition over the last several months. Pt's spouse reports that since last admission pt has been mostly limited to bed, requiring total assist for ADL's at bed level and using hoyer lift to transfer bed<>wheelchair. She was too lethargic for EOB activity this date. Pt's PROM for bil LE is WFL's at knees/hips however bil ankles are limited to neutral with overpressure and pt c/o pain/tightness. Educated pt's spouse on use of PRAFOs' as he states they have a set at home but do not use the regularly. UE's stiff at elbow and shoulder Lt>Rt. Patient will benefit from continued skilled PT interventions to address impairments and progress independence with mobility, recommending return home with HHPT/OT as pt has excellent support from family and all needed equipment at home. Acute PT will follow and progress as able.        Recommendations for follow up therapy are one component of a multi-disciplinary discharge planning process, led by the attending physician.  Recommendations may be updated based on patient status, additional functional criteria and insurance authorization.  Follow Up Recommendations Home health PT (husband wants to take her home. [place] PT/OT services) Can patient physically be transported by private vehicle: No    Assistance Recommended at Discharge Frequent or constant  Supervision/Assistance  Patient can return home with the following  Two people to help with walking and/or transfers;Two people to help with bathing/dressing/bathroom;Assistance with cooking/housework;Assistance with feeding;Direct supervision/assist for medications management;Assist for transportation;Help with stairs or ramp for entrance    Equipment Recommendations None recommended by PT  Recommendations for Other Services       Functional Status Assessment Patient has had a recent decline in their functional status and/or demonstrates limited ability to make significant improvements in function in a reasonable and predictable amount of time     Precautions / Restrictions Precautions Precautions: Fall Precaution Comments: COVID+ Restrictions Weight Bearing Restrictions: No      Mobility  Bed Mobility               General bed mobility comments: Pt Max Assist for repositioning in bed with bed sheet, partial rolling. Pt too lethargic to progress to sitting EOVB and anticipate pt will require Max +2 assist for seated balance.    Transfers                        Ambulation/Gait                  Stairs            Wheelchair Mobility    Modified Rankin (Stroke Patients Only)       Balance                                             Pertinent Vitals/Pain  Pain Assessment Pain Assessment: PAINAD Breathing: normal Negative Vocalization: occasional moan/groan, low speech, negative/disapproving quality Facial Expression: sad, frightened, frown Body Language: relaxed Consolability: no need to console PAINAD Score: 2 Pain Location: generalized with mobility Pain Descriptors / Indicators: Grimacing, Moaning Pain Intervention(s): Limited activity within patient's tolerance, Monitored during session, Repositioned    Home Living Family/patient expects to be discharged to:: Private residence Living Arrangements:  Spouse/significant other Available Help at Discharge: Family;Available 24 hours/day (pt's daughter helps occasionally  but most assist from husband) Type of Home: House Home Access: Stairs to enter;Ramped entrance (ramp at kitchen entrance) Entrance Stairs-Rails: Right;Left Entrance Stairs-Number of Steps: 1 threshold Alternate Level Stairs-Number of Steps: 5--split level; does have chiar lift but pt has not been using. goes into kitchen level and has hospital bed in dining room. all lift equipment on this level with pt. Home Layout: Multi-level Home Equipment: Conservation officer, nature (2 wheels);Cane - single point;BSC/3in1;Rollator (4 wheels);Wheelchair - Education administrator (comment);Hospital bed (hoyer lift, floor lift, standing frame (stedy)) Additional Comments: stair lift, floor lift, car lift    Prior Function Prior Level of Function : Needs assist  Cognitive Assist : Mobility (cognitive);ADLs (cognitive) Mobility (Cognitive): Step by step cues ADLs (Cognitive): Step by step cues Physical Assist : Mobility (physical);ADLs (physical) Mobility (physical): Bed mobility;Transfers ADLs (physical): Feeding;Grooming;Bathing;Dressing;Toileting;IADLs Mobility Comments: mostly w/c level since August/september, walks 20 ft or so with upright walker PTA in January. since last admission has been limited to bed level and total assist transfers with Harrel Lemon Lift to get to wheelchair almost every day with assist from husband. ADLs Comments: pt's husband reports helping pt with ADLs.     Hand Dominance   Dominant Hand: Right    Extremity/Trunk Assessment   Upper Extremity Assessment Upper Extremity Assessment: RUE deficits/detail;LUE deficits/detail    Lower Extremity Assessment Lower Extremity Assessment: RLE deficits/detail;LLE deficits/detail;Generalized weakness    Cervical / Trunk Assessment Cervical / Trunk Assessment: Kyphotic;Other exceptions Cervical / Trunk Exceptions: head rotated Lt and towel  roll used to reposition posture.  Communication      Cognition Arousal/Alertness: Lethargic Behavior During Therapy: Flat affect Overall Cognitive Status: History of cognitive impairments - at baseline Area of Impairment: Following commands, Attention, Safety/judgement, Problem solving, Memory, Awareness, Orientation                 Orientation Level: Disoriented to, Situation, Time, Place Current Attention Level: Focused Memory: Decreased short-term memory (calling MT by wrong name x3 , despite corerction) Following Commands: Follows one step commands inconsistently, Follows one step commands with increased time Safety/Judgement: Decreased awareness of safety, Decreased awareness of deficits Awareness: Intellectual Problem Solving: Slow processing General Comments: participatory. Slow processing trhoughout and fluctuating level of arousal        General Comments General comments (skin integrity, edema, etc.): husband present at beside and supportive, VSS throughout. Lt ring finbger with significant edema and wedding bands cresating large indentation. RN notified for ring removal.    Exercises General Exercises - Upper Extremity Shoulder Flexion: PROM, Both, 5 reps, Supine Shoulder ABduction: PROM, Both, 5 reps, Supine Elbow Flexion: PROM, Both, 5 reps, Supine Elbow Extension: PROM, Both, 5 reps, Supine General Exercises - Lower Extremity Ankle Circles/Pumps: PROM, Both, 5 reps, Supine Hip ABduction/ADduction: PROM, Both, 5 reps, Supine Hip Flexion/Marching: PROM, Both, 5 reps, Supine   Assessment/Plan    PT Assessment Patient needs continued PT services  PT Problem List Decreased strength;Decreased mobility;Decreased activity tolerance;Decreased balance;Decreased knowledge of use of DME;Pain;Decreased cognition;Decreased coordination;Decreased safety awareness;Decreased range of  motion       PT Treatment Interventions DME instruction;Therapeutic activities;Therapeutic  exercise;Patient/family education;Functional mobility training;Neuromuscular re-education;Balance training;Gait training;Cognitive remediation;Wheelchair mobility training    PT Goals (Current goals can be found in the Care Plan section)  Acute Rehab PT Goals Patient Stated Goal: home PT Goal Formulation: With patient/family Time For Goal Achievement: 02/17/23 Potential to Achieve Goals: Fair    Frequency Min 2X/week     Co-evaluation               AM-PAC PT "6 Clicks" Mobility  Outcome Measure Help needed turning from your back to your side while in a flat bed without using bedrails?: Total Help needed moving from lying on your back to sitting on the side of a flat bed without using bedrails?: Total Help needed moving to and from a bed to a chair (including a wheelchair)?: Total Help needed standing up from a chair using your arms (e.g., wheelchair or bedside chair)?: Total Help needed to walk in hospital room?: Total Help needed climbing 3-5 steps with a railing? : Total 6 Click Score: 6    End of Session   Activity Tolerance: Patient limited by lethargy Patient left: with family/visitor present;in bed;with call bell/phone within reach (with maxi-move pad under pt) Nurse Communication: Mobility status;Need for lift equipment PT Visit Diagnosis: Other abnormalities of gait and mobility (R26.89);Muscle weakness (generalized) (M62.81);Difficulty in walking, not elsewhere classified (R26.2)    Time: 2010-0712 PT Time Calculation (min) (ACUTE ONLY): 29 min   Charges:   PT Evaluation $PT Eval Moderate Complexity: 1 Mod          Verner Mould, DPT Acute Rehabilitation Services Office [phone]  02/03/23 10:44 AM

## 2023-02-03 NOTE — Assessment & Plan Note (Signed)
Continue levothyroxine 

## 2023-02-04 ENCOUNTER — Ambulatory Visit: Payer: Medicare PPO | Admitting: Neurology

## 2023-02-04 DIAGNOSIS — Z515 Encounter for palliative care: Secondary | ICD-10-CM

## 2023-02-04 DIAGNOSIS — R55 Syncope and collapse: Secondary | ICD-10-CM | POA: Diagnosis not present

## 2023-02-04 DIAGNOSIS — U071 COVID-19: Secondary | ICD-10-CM | POA: Diagnosis not present

## 2023-02-04 DIAGNOSIS — J9601 Acute respiratory failure with hypoxia: Secondary | ICD-10-CM | POA: Diagnosis not present

## 2023-02-04 DIAGNOSIS — R627 Adult failure to thrive: Secondary | ICD-10-CM | POA: Diagnosis not present

## 2023-02-04 LAB — COMPREHENSIVE METABOLIC PANEL
ALT: 10 U/L (ref 0–44)
AST: 12 U/L — ABNORMAL LOW (ref 15–41)
Albumin: 1.5 g/dL — ABNORMAL LOW (ref 3.5–5.0)
Alkaline Phosphatase: 39 U/L (ref 38–126)
Anion gap: 4 — ABNORMAL LOW (ref 5–15)
BUN: 14 mg/dL (ref 8–23)
CO2: 35 mmol/L — ABNORMAL HIGH (ref 22–32)
Calcium: 8 mg/dL — ABNORMAL LOW (ref 8.9–10.3)
Chloride: 96 mmol/L — ABNORMAL LOW (ref 98–111)
Creatinine, Ser: 0.44 mg/dL (ref 0.44–1.00)
GFR, Estimated: >60 mL/min (ref >60)
Glucose, Bld: 98 mg/dL (ref 70–99)
Potassium: 3.7 mmol/L (ref 3.5–5.1)
Sodium: 135 mmol/L (ref 135–145)
Total Bilirubin: 0.4 mg/dL (ref 0.3–1.2)
Total Protein: 4.3 g/dL — ABNORMAL LOW (ref 6.5–8.1)

## 2023-02-04 LAB — CBC
HCT: 31.5 % — ABNORMAL LOW (ref 36.0–46.0)
Hemoglobin: 10.5 g/dL — ABNORMAL LOW (ref 12.0–15.0)
MCH: 32.7 pg (ref 26.0–34.0)
MCHC: 33.3 g/dL (ref 30.0–36.0)
MCV: 98.1 fL (ref 80.0–100.0)
Platelets: 123 10*3/uL — ABNORMAL LOW (ref 150–400)
RBC: 3.21 MIL/uL — ABNORMAL LOW (ref 3.87–5.11)
RDW: 15.4 % (ref 11.5–15.5)
WBC: 3.6 10*3/uL — ABNORMAL LOW (ref 4.0–10.5)
nRBC: 0 % (ref 0.0–0.2)

## 2023-02-04 LAB — PROCALCITONIN: Procalcitonin: <0.1 ng/mL

## 2023-02-04 NOTE — Plan of Care (Signed)

## 2023-02-04 NOTE — Progress Notes (Signed)
OT Cancellation Note  Patient Details Name: [name] MRN: [id number] DOB: [birth year]   Cancelled Treatment:    Reason Eval/Treat Not Completed: OT screened, no needs identified, will sign off (Pt total care at baseline - has been declining over the last 3 months. Plan is DC home with [facility] services.)  [facility] 02/04/2023, 11:43 AM Maurie Boettcher, OT/L   Acute OT Clinical Specialist Acute Rehabilitation Services Pager [phone] Office [phone]

## 2023-02-04 NOTE — Progress Notes (Signed)
  Progress Note   Patient: [name] GYF:[id number] DOB: Aug 31, 1956 DOA: 02/02/2023     2 DOS: the patient was seen and examined on 02/04/2023 at 8:50AM      Brief hospital course: Mrs. Cerros is a 67 y.o. F with dementia, lives at home, wheelchair dependent, hx TBI with focal epilepsy, recurrent UTIs and VT as well as recent [place] who presented with an unresponsive episode.  Patient was sleeping, and when husband went to wake her, she was unresponsive and blue.  EMS arrived, found her SpO2 70% and performed bag valve mask ventilation for 20 minutes then transitioned to NRB. In the ER, she was able to transition to normal nasal cannula.     Assessment and Plan: * Hypoxia CTA chest normal.  Echo normal.  WBC normal.  Procal negative.    Witnessed here to have numerous apneic episodes. - Supplemental O2 while sleeping - Follow up with Neurology   COVID-19 >10 days ago.  No further isolation needed.  Acute metabolic encephalopathy - Standard delirium precautions: blinds open and lights on during day, TV off, minimize interruptions at night, glasses/hearing aids, PT/OT, avoiding Beers list medications    Hypomagnesemia - Supplement Mag  Hypothyroidism - Continue levothyroxine  Seizure disorder (HCC) EEG normal - Continue Onfi and Depakote  History of VT (ventricular tachycardia) (HCC) - Continue Rhythmol SR  Positive blood culture  1/4 cultures with MRSE, likely contaminant.        Subjective: more awake but still fairly confused.  No new fever, respiratory complaints.     Physical Exam: BP 117/65 (BP Location: Right Arm)   Pulse 75   Temp 98 F (36.7 C) (Oral)   Resp 18   SpO2 100%   Elderly adult female, lying in bed, weak and tired RRR, no murmurs, no peripheral edema Respiratory rate normal, lungs clear without rales or wheezes Makes eye contact, responds to some questions and falls asleep, oriented to self, husband, later not oriented to  situation Severe neurolysed weakness   Data Reviewed: Procalcitonin negative CBC unremarkable, mild anemia CMP normal  Family Communication: Husband at the bedside    Disposition: Status is: Inpatient         Author: Edwin Dada, MD 02/04/2023 1:28 PM  For on call review [email or web address].
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
## 2023-02-04 NOTE — TOC Progression Note (Signed)
Transition of Care [facility]) - Progression Note    Patient Details  Name: [name] MRN: [id number] Date of Birth: Jul 30, 1956  [facility]) CM/SW Contact  Curlene Labrum, RN Phone Number: 02/04/2023, 3:51 PM  Clinical Narrative:    Cm called the patient's husband by phone and offered Medicare choice regarding Palliative care services and the patient's husband chose [facility] [place].  I called Webb Silversmith with [place] and she accepted the referral.   Expected Discharge Plan: [place] Barriers to Discharge: Continued Medical Work up  Expected Discharge Plan and Services   Discharge Planning Services: CM Consult Post Acute Care Choice: Durable Medical Equipment, Home Health Living arrangements for the past 2 months: Single Family Home                 DME Arranged: Oxygen DME Agency: [facility] Date DME Agency Contacted: 02/03/23 Time DME Agency Contacted: [phone] Representative spoke with at DME Agency: Cyril Mourning, [place] with [facility] HH Arranged: RN, PT, OT, Speech Therapy, Nurse's Aide [place] Agency: [place] Date [facility] Agency Contacted: 02/03/23 Time [place]: 1437 Representative spoke with at [place]: Otila Kluver, [place] with [place]   Social Determinants of Health ([place]) Interventions SDOH Screenings   Food Insecurity: No Food Insecurity (01/12/2023)  Housing: Low Risk  (01/12/2023)  Transportation Needs: No Transportation Needs (01/12/2023)  Utilities: Not At Risk (01/12/2023)  Tobacco Use: Low Risk  (02/02/2023)    Readmission Risk Interventions    02/03/2023    2:39 PM 08/24/2022   11:18 AM  Readmission Risk Prevention Plan  Post Dischage Appt  Complete  Medication Screening  Complete  Transportation Screening Complete Complete  PCP or Specialist Appt within 3-5 Days Complete   HRI or [place] Complete   Social Work Consult for [place] Planning/Counseling Complete    Palliative Care Screening Complete   Medication Review Press photographer) Complete

## 2023-02-05 ENCOUNTER — Other Ambulatory Visit (HOSPITAL_COMMUNITY): Payer: Self-pay

## 2023-02-05 DIAGNOSIS — R627 Adult failure to thrive: Secondary | ICD-10-CM | POA: Diagnosis not present

## 2023-02-05 LAB — CULTURE, BLOOD (ROUTINE X 2): Special Requests: ADEQUATE

## 2023-02-05 MED ORDER — LEVOTHYROXINE SODIUM 25 MCG PO TABS
50.0000 ug | ORAL_TABLET | Freq: Every day | ORAL | 0 refills | Status: DC
  Filled 2023-02-05: qty 60, 30d supply, fill #0
  Filled 2023-02-05: qty 30, 30d supply, fill #0

## 2023-02-05 NOTE — Plan of Care (Signed)
  Problem: Respiratory: Goal: Will maintain a patent airway Outcome: Progressing   Problem: Clinical Measurements: Goal: Will remain free from infection Outcome: Progressing Goal: Cardiovascular complication will be avoided Outcome: Progressing

## 2023-02-05 NOTE — TOC Transition Note (Signed)
Transition of Care [facility]) - CM/SW Discharge Note   Patient Details  Name: [name] MRN: [id number] Date of Birth: [birth year]  Transition of Care [facility]) CM/SW Contact:  Curlene Labrum, RN Phone Number: 02/05/2023, 11:08 AM   Clinical Narrative:    CM spoke with attending physician and the patient is being discharged home today and will need PTAR transport.  Dr. Loleta Books asked that I follow up with [facility] for RN visit at the home in the next few days for follow up with the family.  I called and spoke with Webb Silversmith with [place] and she plans visit in the next few days.  Patient will likely qualify for hospice services per attending.  The patient's husband and patient are aware.  PTAR is arranged for transport to home.   Final next level of care: Home w Home Health Services Barriers to Discharge: Continued Medical Work up   Patient Goals and CMS Choice CMS Medicare.gov Compare Post Acute Care list provided to:: Patient Represenative (must comment) (Patient's husband) Choice offered to / list presented to : Spouse  Discharge Placement                         Discharge Plan and Services Additional resources added to the After Visit Summary for     Discharge Planning Services: CM Consult Post Acute Care Choice: Durable Medical Equipment, Home Health          DME Arranged: Oxygen DME Agency: [facility] Date DME Agency Contacted: 02/03/23 Time DME Agency Contacted: [phone] Representative spoke with at DME Agency: Cyril Mourning, [place] with [employer] HH Arranged: RN, PT, OT, Speech Therapy, Nurse's Aide [place] Agency: [place] Date [facility] Agency Contacted: 02/03/23 Time [place]: 1437 Representative spoke with at [place]: Otila Kluver, [place] with [place]  Social Determinants of Health ([place]) Interventions SDOH Screenings   Food Insecurity: No Food Insecurity (01/12/2023)  Housing: Low Risk  (01/12/2023)  Transportation  Needs: No Transportation Needs (01/12/2023)  Utilities: Not At Risk (01/12/2023)  Tobacco Use: Low Risk  (02/02/2023)     Readmission Risk Interventions    02/03/2023    2:39 PM 08/24/2022   11:18 AM  Readmission Risk Prevention Plan  Post Dischage Appt  Complete  Medication Screening  Complete  Transportation Screening Complete Complete  PCP or Specialist Appt within 3-5 Days Complete   HRI or [place] Complete   Social Work Consult for [place] Planning/Counseling Complete   Palliative Care Screening Complete   Medication Review Press photographer) Complete

## 2023-02-05 NOTE — Care Management Important Message (Signed)
Important Message  Patient Details  Name: [name] MRN: [id number] Date of Birth: 08-05-1956   Medicare Important Message Given:  Yes   Patient left prior to IM delivery will mail to the patient home address.   Eugen Jeansonne 02/05/2023, 2:20 PM

## 2023-02-05 NOTE — Discharge Summary (Addendum)
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 swallow initiated. During pharyngeal phase, swallow was initiated at level of vallecular sinus with all boluses (delayed at level of pyriform sinus with one of the sips of thin liquids). Mild amount of vallecular residuals remained after initial swallow with puree solids and mechanical soft solids, trace valecular and pyriform residuals observed after initial swallows of thin liquids and trace vallecular residuals with nectar thick liquids. No penetration or aspiration observed with any of the tested boluses. Vallecular and pyriform sinus residuals cleared with subsequent swallows. During cervical esophageal phase of swallow, SLP observed presence of prominent cricopharyngeal bar and suspected cervical osteophytes (no radiologist present to confirm) which did appear to contribute to slower transit of puree and mechanical soft barium boluses through upper  esophagus after passing through UES. In addition, barium stasis observed in upper thoracic portion of esophagus. SLP recommending continue with mechanical soft solids (dys 3) but to advance back to thin liquids. Patient should be sitting upright, be adequately awake and alert with all PO's; alternate sips of liquids after bites of solids at a relatively slow, steady pace. Eating large bites and/or eating too rapidly will very likely result in buildup of PO's in upper esophagus and in pharynx. SLP will continue to follow for diet toleration and education. SLP Visit Diagnosis Dysphagia, oropharyngeal phase (R13.12);Dysphagia, pharyngoesophageal phase (R13.14) Impact on safety and function Mild aspiration risk     01/20/2023   4:37 PM Treatment Recommendations Treatment Recommendations Therapy as outlined in treatment plan below     01/20/2023   4:52 PM Prognosis Prognosis for Safe Diet Advancement Good Barriers to Reach Goals Cognitive deficits   01/20/2023   4:37 PM Diet Recommendations SLP Diet Recommendations Dysphagia 3 (Mech soft) solids;Thin liquid Liquid Administration via Cup;Straw Medication Administration Crushed with puree Compensations Slow rate;Small sips/bites;Follow solids with liquid Postural Changes Seated upright at 90 degrees     01/20/2023   4:37 PM Other Recommendations Oral Care Recommendations Oral care BID Follow Up Recommendations Other (comment) Functional Status Assessment Patient has had a recent decline in their functional status and demonstrates the ability to make significant improvements in function in a reasonable and predictable amount of time.   01/20/2023   4:37 PM Frequency and Duration  Speech Therapy Frequency (ACUTE ONLY) min 2x/week Treatment Duration 2 weeks     01/20/2023   4:32 PM Oral Phase Oral Phase Impaired Oral - Nectar Cup Reduced posterior propulsion;Weak lingual manipulation Oral - Nectar Straw Other (Comment) Oral - Thin Cup Reduced posterior propulsion;Weak lingual  manipulation Oral - Thin Straw Reduced posterior propulsion;Weak lingual manipulation Oral - Puree Reduced posterior propulsion;Weak lingual manipulation Oral - Mech Soft Impaired mastication;Weak lingual manipulation;Reduced posterior propulsion    01/20/2023   4:34 PM Pharyngeal Phase Pharyngeal Phase Impaired Pharyngeal- Nectar Cup Delayed swallow initiation-vallecula;Pharyngeal residue - valleculae Pharyngeal- Thin Cup Delayed swallow initiation-vallecula;Pharyngeal residue - valleculae;Pharyngeal residue - pyriform Pharyngeal- Thin Straw Delayed swallow initiation-vallecula;Pharyngeal residue - valleculae;Pharyngeal residue - pyriform Pharyngeal- Puree Delayed swallow initiation-vallecula;Pharyngeal residue - valleculae Pharyngeal- Mechanical Soft Delayed swallow initiation-vallecula;Pharyngeal residue - valleculae    01/20/2023   4:35 PM Cervical Esophageal Phase  Cervical Esophageal Phase Impaired Cervical Esophageal Comment prominent cricopharyngeal bar, questionable cervical osteophytes and decreased motility of puree and mechanical soft solids at level of cervical  and upper thoracic esophagus. Sonia Baller, MA, CCC-SLP Speech Therapy                     DG Chest Port 1 View  Result Date: 01/19/2023 CLINICAL DATA:  141880 SOB (shortness of breath) [id number] EXAM: PORTABLE CHEST - 1 VIEW COMPARISON:  01/18/2023 FINDINGS: Cardiac silhouette is unremarkable. No pneumothorax or pleural effusion. The lungs are clear. Aorta is calcified. The visualized skeletal structures are unremarkable. IMPRESSION: No acute cardiopulmonary process. Electronically Signed   By: Sammie Bench M.D.   On: 01/19/2023 10:10   VAS [place] LOWER EXTREMITY VENOUS (DVT)  Result Date: 01/18/2023  Lower Venous DVT Study Patient Name:  DEZYRAE LUEBBERT  Date of Exam:   01/17/2023 Medical Rec #: [id number]      Accession #:    [id number] Date of Birth: 03-03-1956      Patient Gender: F Patient Age:   56 years Exam Location:  [facility] Procedure:      VAS [place] LOWER EXTREMITY VENOUS (DVT) Referring Phys: Jarrett Soho MASOUD --------------------------------------------------------------------------------  Indications: Pain.  Risk Factors: COVID 19 positive. Limitations: Poor ultrasound/tissue interface and patient positioning, patient immobility, patient somnolence. Comparison Study: No prior studies. Performing Technologist: Oliver Hum RVT  Examination Guidelines: A complete evaluation includes B-mode imaging, spectral Doppler, color Doppler, and power Doppler as needed of all accessible portions of each vessel. Bilateral testing is considered an integral part of a complete examination. Limited examinations for reoccurring indications may be performed as noted. The reflux portion of the exam is performed with the patient in reverse Trendelenburg.  +---------+---------------+---------+-----------+----------+--------------+ RIGHT    CompressibilityPhasicitySpontaneityPropertiesThrombus Aging +---------+---------------+---------+-----------+----------+--------------+ CFV      Full           Yes      Yes                                 +---------+---------------+---------+-----------+----------+--------------+ SFJ      Full                                                        +---------+---------------+---------+-----------+----------+--------------+ FV Prox  Full                                                        +---------+---------------+---------+-----------+----------+--------------+ FV Mid   Full                                                        +---------+---------------+---------+-----------+----------+--------------+ FV Distal               Yes      Yes                                 +---------+---------------+---------+-----------+----------+--------------+ PFV      Full                                                         +---------+---------------+---------+-----------+----------+--------------+  POP      Full           Yes      Yes                                 +---------+---------------+---------+-----------+----------+--------------+ PTV      Full                                                        +---------+---------------+---------+-----------+----------+--------------+ PERO     Full                                                        +---------+---------------+---------+-----------+----------+--------------+   +---------+---------------+---------+-----------+----------+--------------+ LEFT     CompressibilityPhasicitySpontaneityPropertiesThrombus Aging +---------+---------------+---------+-----------+----------+--------------+ CFV      Full           Yes      Yes                                 +---------+---------------+---------+-----------+----------+--------------+ SFJ      Full                                                        +---------+---------------+---------+-----------+----------+--------------+ FV Prox  Full                                                        +---------+---------------+---------+-----------+----------+--------------+ FV Mid   Full                                                        +---------+---------------+---------+-----------+----------+--------------+ FV Distal               Yes      Yes                                 +---------+---------------+---------+-----------+----------+--------------+ PFV      Full                                                        +---------+---------------+---------+-----------+----------+--------------+ POP      Full           Yes      Yes                                 +---------+---------------+---------+-----------+----------+--------------+  PTV      Full                                                         +---------+---------------+---------+-----------+----------+--------------+ PERO     Full                                                        +---------+---------------+---------+-----------+----------+--------------+     Summary: RIGHT: - There is no evidence of deep vein thrombosis in the lower extremity. However, portions of this examination were limited- see technologist comments above.  - No cystic structure found in the popliteal fossa.  LEFT: - There is no evidence of deep vein thrombosis in the lower extremity. However, portions of this examination were limited- see technologist comments above.  - No cystic structure found in the popliteal fossa.  *See table(s) above for measurements and observations. Electronically signed by Harold Barban MD on 01/18/2023 at 11:30:40 AM.    Final    Overnight EEG with video  Result Date: 01/18/2023 Lora Havens, MD     01/18/2023  5:15 PM Patient Name: KIYONNA BACKER MRN: [id number] Epilepsy Attending: Lora Havens Referring Physician/Provider: Derek Jack, MD Duration: 01/17/2023 1416 to [id number] Patient history: 67 year old female with altered mental status.  EEG to evaluate for seizure. Level of alertness: awake, asleep AEDs during EEG study: Depakote, Onfi Technical aspects: This EEG study was done with scalp electrodes positioned according to the 10-20 International system of electrode placement. Electrical activity was reviewed with band pass filter of 1-70Hz$ , sensitivity of 7 uV/mm, display speed of 22m/sec with a 60Hz$  notched filter applied as appropriate. EEG data were recorded continuously and digitally stored.  Video monitoring was available and reviewed as appropriate. Description: The posterior dominant rhythm consists of 8 Hz activity of moderate voltage (25-35 uV) seen predominantly in posterior head regions, symmetric and reactive to eye opening and eye closing. Sleep was characterized by vertex waves, sleep spindles (12 to 14  Hz), maximal frontocentral region. EEG showed intermittent generalized 3 to 6 Hz theta-delta slowing. Hyperventilation and photic stimulation were not performed.   ABNORMALITY - Intermittent slow, generalized IMPRESSION: This study is suggestive of mild diffuse encephalopathy, nonspecific etiology. No seizures or epileptiform discharges were seen throughout the recording. PLora Havens  DG Chest Port 1 View  Result Date: 01/18/2023 CLINICAL DATA:  Shortness of breath EXAM: PORTABLE CHEST 1 VIEW COMPARISON:  01/17/2023 FINDINGS: Low volume chest with indistinct density at the bases, unchanged. Volume loss by prior chest CT. No definite or significant effusion. No pneumothorax. Stable heart size and mediastinal contours accentuated by low volumes. IMPRESSION: Unchanged low volume chest with atelectasis at the bases. Electronically Signed   By: JJorje GuildM.D.   On: 01/18/2023 06:48   CT Angio Chest Pulmonary Embolism (PE) W or WO Contrast  Result Date: 01/17/2023 CLINICAL DATA:  Suspected pulmonary embolism. EXAM: CT ANGIOGRAPHY CHEST WITH CONTRAST TECHNIQUE: Multidetector CT imaging of the chest was performed using the standard protocol during bolus administration of intravenous contrast. Multiplanar CT image reconstructions and MIPs were obtained to evaluate the vascular anatomy. RADIATION DOSE REDUCTION:  This exam was performed according to the departmental dose-optimization program which includes automated exposure control, adjustment of the mA and/or kV according to patient size and/or use of iterative reconstruction technique. CONTRAST:  75m OMNIPAQUE IOHEXOL 350 MG/ML SOLN COMPARISON:  None Available. FINDINGS: Cardiovascular: There is very mild calcification of the aortic arch without evidence of thoracic aortic aneurysm. Satisfactory opacification of the pulmonary arteries to the segmental level. No evidence of pulmonary embolism. Normal heart size. No pericardial effusion.  Mediastinum/Nodes: No enlarged mediastinal, hilar, or axillary lymph nodes. Thyroid gland, trachea, and esophagus demonstrate no significant findings. Lungs/Pleura: Marked severity consolidation is seen throughout the bilateral lower lobes. Mild linear atelectasis is seen within the right middle lobe. Mucous is seen within several occluded bilateral lower lobe bronchi. There is no evidence of a pleural effusion or pneumothorax. Upper Abdomen: No acute abnormality. Musculoskeletal: Multilevel degenerative changes are seen throughout the thoracic spine Review of the MIP images confirms the above findings. IMPRESSION: 1. No evidence of pulmonary embolism. 2. Marked severity bilateral lower lobe consolidation. 3. Mild right middle lobe linear atelectasis. 4. Aortic atherosclerosis. Aortic Atherosclerosis (ICD10-I70.0). Electronically Signed   By: TVirgina NorfolkM.D.   On: 01/17/2023 18:29   MR BRAIN WO CONTRAST  Result Date: 01/17/2023 CLINICAL DATA:  Neuro deficit with acute stroke suspected EXAM: MRI HEAD WITHOUT CONTRAST TECHNIQUE: Multiplanar, multiecho pulse sequences of the brain and surrounding structures were obtained without intravenous contrast. COMPARISON:  08/26/2014 FINDINGS: Brain: No acute infarction, hemorrhage, hydrocephalus, extra-axial collection or mass lesion. Generalized atrophy with ventriculomegaly. Cortical encephalomalacia at the anterior superior left temporal lobe and left frontal operculum, question prior trauma given this focal pattern. Chronic small vessel ischemia which is confluent in the periventricular white matter. Vascular: Normal flow voids. Skull and upper cervical spine: Normal marrow signal. Sinuses/Orbits: Negative. IMPRESSION: 1. No acute or reversible finding. 2. Pronounced atrophy which is progressed from 2015. 3. Confluent chronic small vessel ischemia in the cerebral white matter. 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 Electronically  Signed   By: JJorje GuildM.D.   On: 01/17/2023 12:34   DG CHEST PORT 1 VIEW  Result Date: 01/17/2023 CLINICAL DATA:  Hypoxia. EXAM: PORTABLE CHEST 1 VIEW COMPARISON:  01/11/2023 FINDINGS: Patient is rotated to the left. Lungs are hypoinflated with subtle streaky bibasilar density likely atelectasis. No lobar consolidation or effusion. Cardiomediastinal silhouette and remainder of the exam is unchanged. IMPRESSION: Hypoinflation with subtle streaky bibasilar density likely atelectasis. Electronically Signed   By: DMarin OlpM.D.   On: 01/17/2023 09:42   [place]domen Limited RUQ (LIVER/GB)  Result Date: 01/17/2023 CLINICAL DATA:  Abdominal pain. EXAM: ULTRASOUND ABDOMEN LIMITED RIGHT UPPER QUADRANT COMPARISON:  None Available. FINDINGS: Gallbladder: No gallstones or wall thickening visualized. No sonographic Murphy sign noted by sonographer. Common bile duct: Diameter: 3.1 mm Liver: Increased hepatic parenchymal echogenicity. No focal lesion. Portal vein is patent on color Doppler imaging with normal direction of blood flow towards the liver. Other: None. IMPRESSION: 1. No cholelithiasis or sonographic evidence for acute cholecystitis. 2. Increased hepatic parenchymal echogenicity suggestive of steatosis. Electronically Signed   By: DLovey NewcomerM.D.   On: 01/17/2023 09:41   CT Head Wo Contrast  Result Date: 01/11/2023 CLINICAL DATA:  Weakness, altered level of consciousness, dementia EXAM: CT HEAD WITHOUT CONTRAST TECHNIQUE: Contiguous axial images were obtained from the base of the skull through the vertex without intravenous contrast. RADIATION DOSE REDUCTION: This exam was performed according to the departmental dose-optimization program which  includes automated exposure control, adjustment of the mA and/or kV according to patient size and/or use of iterative reconstruction technique. COMPARISON:  08/26/2014 FINDINGS: Brain: Hypodensities throughout the periventricular white matter consistent with  chronic small vessel ischemic change. No evidence of acute infarct or hemorrhage. Prominence of the lateral ventricles out of proportion to the degree of cerebral atrophy. This could reflect normal pressure hydrocephalus. Remaining midline structures are unremarkable. No acute extra-axial fluid collections. No mass effect. Vascular: No hyperdense vessel or unexpected calcification. Skull: Normal. Negative for fracture or focal lesion. Sinuses/Orbits: No acute finding. Other: None. IMPRESSION: 1. No acute intracranial process. 2. Prominent lateral ventricles out of proportion to the degree of cerebral atrophy, which could reflect normal pressure hydrocephalus. Electronically Signed   By: Randa Ngo M.D.   On: 01/11/2023 21:09   DG Chest Port 1 View  Result Date: 01/11/2023 CLINICAL DATA:  Sepsis. EXAM: PORTABLE CHEST 1 VIEW COMPARISON:  08/20/2022 x-ray and older FINDINGS: No consolidation, pneumothorax or effusion. No edema. Apical pleural thickening. Tortuous and ectatic aorta. Overlapping cardiac leads. IMPRESSION: No acute cardiopulmonary disease. Electronically Signed   By: Jill Side M.D.   On: 01/11/2023 19:16    Microbiology: Results for orders placed or performed during the hospital encounter of 02/02/23  Resp panel by RT-PCR (RSV, Flu A&B, Covid) Anterior Nasal Swab     Status: Abnormal   Collection Time: 02/02/23 12:57 PM   Specimen: Anterior Nasal Swab  Result Value Ref Range Status   SARS Coronavirus 2 by RT PCR POSITIVE (A) NEGATIVE Final   Influenza A by PCR NEGATIVE NEGATIVE Final   Influenza B by PCR NEGATIVE NEGATIVE Final    Comment: (NOTE) The Xpert Xpress SARS-CoV-2/FLU/RSV plus assay is intended as an aid in the diagnosis of influenza from Nasopharyngeal swab specimens and should not be used as a sole basis for treatment. Nasal washings and aspirates are unacceptable for Xpert Xpress SARS-CoV-2/FLU/RSV testing.  Fact Sheet for  Patients: EntrepreneurPulse.com.au  Fact Sheet for Healthcare Providers: IncredibleEmployment.be  This test is not yet approved or cleared by the [place] FDA and has been authorized for detection and/or diagnosis of SARS-CoV-2 by FDA under an Emergency Use Authorization (EUA). This EUA will remain in effect (meaning this test can be used) for the duration of the COVID-19 declaration under Section 564(b)(1) of the Act, 21 U.S.C. section 360bbb-3(b)(1), unless the authorization is terminated or revoked.     Resp Syncytial Virus by PCR NEGATIVE NEGATIVE Final    Comment: (NOTE) Fact Sheet for Patients: EntrepreneurPulse.com.au  Fact Sheet for Healthcare Providers: IncredibleEmployment.be  This test is not yet approved or cleared by the [place] FDA and has been authorized for detection and/or diagnosis of SARS-CoV-2 by FDA under an Emergency Use Authorization (EUA). This EUA will remain in effect (meaning this test can be used) for the duration of the COVID-19 declaration under Section 564(b)(1) of the Act, 21 U.S.C. section 360bbb-3(b)(1), unless the authorization is terminated or revoked.  Performed at [facility], [place] [address] [place], [place] [address]   Blood culture (routine x 2)     Status: None (Preliminary result)   Collection Time: 02/02/23  1:10 PM   Specimen: BLOOD RIGHT FOREARM  Result Value Ref Range Status   Specimen Description BLOOD RIGHT FOREARM  Final   Special Requests   Final    BOTTLES DRAWN AEROBIC AND ANAEROBIC Blood Culture results may not be optimal due to an inadequate volume of blood received in  culture bottles   Culture   Final    NO GROWTH 3 DAYS Performed at [facility], [place] [address] [place], [place] [address]    Report Status PENDING  Incomplete  Blood culture (routine x 2)     Status: Abnormal   Collection Time: 02/02/23  1:22 PM    Specimen: BLOOD RIGHT FOREARM  Result Value Ref Range Status   Specimen Description BLOOD RIGHT FOREARM  Final   Special Requests   Final    BOTTLES DRAWN AEROBIC AND ANAEROBIC Blood Culture adequate volume   Culture  Setup Time   Final    GRAM POSITIVE COCCI ANAEROBIC BOTTLE ONLY CRITICAL RESULT CALLED TO, READ BACK BY AND VERIFIED WITH: E. MARTIN, PHARMD, AT 0750 02/03/23 BY D. VANHOOK    Culture (A)  Final    STAPHYLOCOCCUS EPIDERMIDIS THE SIGNIFICANCE OF ISOLATING THIS ORGANISM FROM A SINGLE SET OF BLOOD CULTURES WHEN MULTIPLE SETS ARE DRAWN IS UNCERTAIN. PLEASE NOTIFY THE MICROBIOLOGY DEPARTMENT WITHIN ONE WEEK IF SPECIATION AND SENSITIVITIES ARE REQUIRED. Performed at [facility], [place] [address], [place], [place] [address]    Report Status 02/05/2023 FINAL  Final  Blood Culture ID Panel (Reflexed)     Status: Abnormal   Collection Time: 02/02/23  1:22 PM  Result Value Ref Range Status   Enterococcus faecalis NOT DETECTED NOT DETECTED Final   Enterococcus Faecium NOT DETECTED NOT DETECTED Final   Listeria monocytogenes NOT DETECTED NOT DETECTED Final   Staphylococcus species DETECTED (A) NOT DETECTED Final    Comment: CRITICAL RESULT CALLED TO, READ BACK BY AND VERIFIED WITH: EHassell Done, PHARMD, AT 0750 02/03/23 BY D. VANHOOK    Staphylococcus aureus (BCID) NOT DETECTED NOT DETECTED Final   Staphylococcus epidermidis DETECTED (A) NOT DETECTED Final    Comment: Methicillin (oxacillin) resistant coagulase negative staphylococcus. Possible blood culture contaminant (unless isolated from more than one blood culture draw or clinical case suggests pathogenicity). No antibiotic treatment is indicated for blood  culture contaminants. CRITICAL RESULT CALLED TO, READ BACK BY AND VERIFIED WITH: Ferne Coe, PHARMD, AT 0750 02/03/23 BY D. VANHOOK    Staphylococcus lugdunensis NOT DETECTED NOT DETECTED Final   Streptococcus species NOT DETECTED NOT DETECTED Final   Streptococcus agalactiae  NOT DETECTED NOT DETECTED Final   Streptococcus pneumoniae NOT DETECTED NOT DETECTED Final   Streptococcus pyogenes NOT DETECTED NOT DETECTED Final   A.calcoaceticus-baumannii NOT DETECTED NOT DETECTED Final   Bacteroides fragilis NOT DETECTED NOT DETECTED Final   Enterobacterales NOT DETECTED NOT DETECTED Final   Enterobacter cloacae complex NOT DETECTED NOT DETECTED Final   Escherichia coli NOT DETECTED NOT DETECTED Final   Klebsiella aerogenes NOT DETECTED NOT DETECTED Final   Klebsiella oxytoca NOT DETECTED NOT DETECTED Final   Klebsiella pneumoniae NOT DETECTED NOT DETECTED Final   Proteus species NOT DETECTED NOT DETECTED Final   Salmonella species NOT DETECTED NOT DETECTED Final   Serratia marcescens NOT DETECTED NOT DETECTED Final   Haemophilus influenzae NOT DETECTED NOT DETECTED Final   Neisseria meningitidis NOT DETECTED NOT DETECTED Final   Pseudomonas aeruginosa NOT DETECTED NOT DETECTED Final   Stenotrophomonas maltophilia NOT DETECTED NOT DETECTED Final   Candida albicans NOT DETECTED NOT DETECTED Final   Candida auris NOT DETECTED NOT DETECTED Final   Candida glabrata NOT DETECTED NOT DETECTED Final   Candida krusei NOT DETECTED NOT DETECTED Final   Candida parapsilosis NOT DETECTED NOT DETECTED Final   Candida tropicalis NOT DETECTED NOT DETECTED Final  Cryptococcus neoformans/gattii NOT DETECTED NOT DETECTED Final   Methicillin resistance mecA/C DETECTED (A) NOT DETECTED Final    Comment: CRITICAL RESULT CALLED TO, READ BACK BY AND VERIFIED WITHFerne Coe, PHARMD, AT 0750 02/03/23 BY Rush Landmark Performed at [facility], [place] [address], [place], [place] [address]   MRSA Next Gen by PCR, Nasal     Status: None   Collection Time: 02/02/23  5:57 PM   Specimen: Nasal Mucosa; Nasal Swab  Result Value Ref Range Status   MRSA by PCR Next Gen NOT DETECTED NOT DETECTED Final    Comment: (NOTE) The GeneXpert MRSA Assay (FDA approved for NASAL specimens only), is one  component of a comprehensive MRSA colonization surveillance program. It is not intended to diagnose MRSA infection nor to guide or monitor treatment for MRSA infections. Test performance is not FDA approved in patients less than 40 years old. Performed at [facility], [place] [address], [place], [place] [address]     Labs: CBC: Recent Labs  Lab 02/02/23 1254 02/02/23 1313 02/02/23 1314 02/03/23 0427 02/04/23 0345  WBC 4.8  --   --  5.0 3.6*  NEUTROABS 2.9  --   --   --   --   HGB 12.4 11.9* 12.6 11.7* 10.5*  HCT 37.2 35.0* 37.0 37.7 31.5*  MCV 100.3*  --   --  104.4* 98.1  PLT 157  --   --  123* [id number]*   Basic Metabolic Panel: Recent Labs  Lab 02/02/23 1254 02/02/23 1313 02/02/23 1314 02/03/23 0427 02/04/23 0345  NA 136 136 136 133* 135  K 4.1 4.8 4.8 4.4 3.7  CL 98 98  --  97* 96*  CO2 29  --   --  27 35*  GLUCOSE 86 92  --  145* 98  BUN 22 31*  --  17 14  CREATININE 0.65 0.50  --  0.46 0.44  CALCIUM 8.3*  --   --  8.0* 8.0*  MG  --   --   --  1.6*  --   PHOS  --   --   --  3.3  --    Liver Function Tests: Recent Labs  Lab 02/02/23 1254 02/03/23 0427 02/04/23 0345  AST 18 19  17 $ 12*  ALT 11 10  10 10  $ ALKPHOS 48 44  45 39  BILITOT 0.3 0.3  0.4 0.4  PROT 5.1* 4.9*  5.0* 4.3*  ALBUMIN 1.9* 1.7*  1.7* 1.5*   CBG: No results for input(s): "GLUCAP" in the last 168 hours.  Discharge time spent: approximately 35 minutes spent on discharge counseling, evaluation of patient on day of discharge, and coordination of discharge planning with nursing, social work, pharmacy and case management  Signed: Edwin Dada, MD [facility] 02/05/2023

## 2023-02-07 LAB — CULTURE, BLOOD (ROUTINE X 2): Culture: NO GROWTH

## 2023-02-08 ENCOUNTER — Ambulatory Visit: Payer: Medicare PPO | Admitting: Cardiology

## 2023-02-10 ENCOUNTER — Other Ambulatory Visit (HOSPITAL_COMMUNITY): Payer: Medicare PPO

## 2023-02-10 ENCOUNTER — Ambulatory Visit (HOSPITAL_COMMUNITY): Payer: Medicare PPO

## 2023-02-12 DIAGNOSIS — B9620 Unspecified Escherichia coli [E. coli] as the cause of diseases classified elsewhere: Secondary | ICD-10-CM | POA: Diagnosis not present

## 2023-02-12 DIAGNOSIS — G3109 Other frontotemporal dementia: Secondary | ICD-10-CM | POA: Diagnosis not present

## 2023-02-12 DIAGNOSIS — U071 COVID-19: Secondary | ICD-10-CM | POA: Diagnosis not present

## 2023-02-12 DIAGNOSIS — J1282 Pneumonia due to coronavirus disease 2019: Secondary | ICD-10-CM | POA: Diagnosis not present

## 2023-02-12 DIAGNOSIS — G9341 Metabolic encephalopathy: Secondary | ICD-10-CM | POA: Diagnosis not present

## 2023-02-12 DIAGNOSIS — I10 Essential (primary) hypertension: Secondary | ICD-10-CM | POA: Diagnosis not present

## 2023-02-12 DIAGNOSIS — N390 Urinary tract infection, site not specified: Secondary | ICD-10-CM | POA: Diagnosis not present

## 2023-02-12 DIAGNOSIS — F0280 Dementia in other diseases classified elsewhere without behavioral disturbance: Secondary | ICD-10-CM | POA: Diagnosis not present

## 2023-02-12 DIAGNOSIS — G40209 Localization-related (focal) (partial) symptomatic epilepsy and epileptic syndromes with complex partial seizures, not intractable, without status epilepticus: Secondary | ICD-10-CM | POA: Diagnosis not present

## 2023-02-16 DIAGNOSIS — N390 Urinary tract infection, site not specified: Secondary | ICD-10-CM | POA: Diagnosis not present

## 2023-02-16 DIAGNOSIS — G3109 Other frontotemporal dementia: Secondary | ICD-10-CM | POA: Diagnosis not present

## 2023-02-16 DIAGNOSIS — U071 COVID-19: Secondary | ICD-10-CM | POA: Diagnosis not present

## 2023-02-16 DIAGNOSIS — F0280 Dementia in other diseases classified elsewhere without behavioral disturbance: Secondary | ICD-10-CM | POA: Diagnosis not present

## 2023-02-16 DIAGNOSIS — G9341 Metabolic encephalopathy: Secondary | ICD-10-CM | POA: Diagnosis not present

## 2023-02-16 DIAGNOSIS — B9620 Unspecified Escherichia coli [E. coli] as the cause of diseases classified elsewhere: Secondary | ICD-10-CM | POA: Diagnosis not present

## 2023-02-16 DIAGNOSIS — J1282 Pneumonia due to coronavirus disease 2019: Secondary | ICD-10-CM | POA: Diagnosis not present

## 2023-02-16 DIAGNOSIS — G40209 Localization-related (focal) (partial) symptomatic epilepsy and epileptic syndromes with complex partial seizures, not intractable, without status epilepticus: Secondary | ICD-10-CM | POA: Diagnosis not present

## 2023-02-16 DIAGNOSIS — I10 Essential (primary) hypertension: Secondary | ICD-10-CM | POA: Diagnosis not present

## 2023-02-17 DIAGNOSIS — G3109 Other frontotemporal dementia: Secondary | ICD-10-CM | POA: Diagnosis not present

## 2023-02-17 DIAGNOSIS — N390 Urinary tract infection, site not specified: Secondary | ICD-10-CM | POA: Diagnosis not present

## 2023-02-17 DIAGNOSIS — I10 Essential (primary) hypertension: Secondary | ICD-10-CM | POA: Diagnosis not present

## 2023-02-17 DIAGNOSIS — F0280 Dementia in other diseases classified elsewhere without behavioral disturbance: Secondary | ICD-10-CM | POA: Diagnosis not present

## 2023-02-17 DIAGNOSIS — U071 COVID-19: Secondary | ICD-10-CM | POA: Diagnosis not present

## 2023-02-17 DIAGNOSIS — B9620 Unspecified Escherichia coli [E. coli] as the cause of diseases classified elsewhere: Secondary | ICD-10-CM | POA: Diagnosis not present

## 2023-02-17 DIAGNOSIS — G40209 Localization-related (focal) (partial) symptomatic epilepsy and epileptic syndromes with complex partial seizures, not intractable, without status epilepticus: Secondary | ICD-10-CM | POA: Diagnosis not present

## 2023-02-17 DIAGNOSIS — G9341 Metabolic encephalopathy: Secondary | ICD-10-CM | POA: Diagnosis not present

## 2023-02-17 DIAGNOSIS — J1282 Pneumonia due to coronavirus disease 2019: Secondary | ICD-10-CM | POA: Diagnosis not present

## 2023-02-18 DIAGNOSIS — B9620 Unspecified Escherichia coli [E. coli] as the cause of diseases classified elsewhere: Secondary | ICD-10-CM | POA: Diagnosis not present

## 2023-02-18 DIAGNOSIS — G40209 Localization-related (focal) (partial) symptomatic epilepsy and epileptic syndromes with complex partial seizures, not intractable, without status epilepticus: Secondary | ICD-10-CM | POA: Diagnosis not present

## 2023-02-18 DIAGNOSIS — J1282 Pneumonia due to coronavirus disease 2019: Secondary | ICD-10-CM | POA: Diagnosis not present

## 2023-02-18 DIAGNOSIS — F0280 Dementia in other diseases classified elsewhere without behavioral disturbance: Secondary | ICD-10-CM | POA: Diagnosis not present

## 2023-02-18 DIAGNOSIS — U071 COVID-19: Secondary | ICD-10-CM | POA: Diagnosis not present

## 2023-02-18 DIAGNOSIS — I10 Essential (primary) hypertension: Secondary | ICD-10-CM | POA: Diagnosis not present

## 2023-02-18 DIAGNOSIS — G9341 Metabolic encephalopathy: Secondary | ICD-10-CM | POA: Diagnosis not present

## 2023-02-18 DIAGNOSIS — N390 Urinary tract infection, site not specified: Secondary | ICD-10-CM | POA: Diagnosis not present

## 2023-02-18 DIAGNOSIS — G3109 Other frontotemporal dementia: Secondary | ICD-10-CM | POA: Diagnosis not present

## 2023-02-22 DIAGNOSIS — N390 Urinary tract infection, site not specified: Secondary | ICD-10-CM | POA: Diagnosis not present

## 2023-02-22 DIAGNOSIS — A419 Sepsis, unspecified organism: Secondary | ICD-10-CM | POA: Diagnosis not present

## 2023-02-22 DIAGNOSIS — J9601 Acute respiratory failure with hypoxia: Secondary | ICD-10-CM | POA: Diagnosis not present

## 2023-02-24 DIAGNOSIS — F0280 Dementia in other diseases classified elsewhere without behavioral disturbance: Secondary | ICD-10-CM | POA: Diagnosis not present

## 2023-02-24 DIAGNOSIS — G9341 Metabolic encephalopathy: Secondary | ICD-10-CM | POA: Diagnosis not present

## 2023-02-24 DIAGNOSIS — B9620 Unspecified Escherichia coli [E. coli] as the cause of diseases classified elsewhere: Secondary | ICD-10-CM | POA: Diagnosis not present

## 2023-02-24 DIAGNOSIS — N390 Urinary tract infection, site not specified: Secondary | ICD-10-CM | POA: Diagnosis not present

## 2023-02-24 DIAGNOSIS — G40209 Localization-related (focal) (partial) symptomatic epilepsy and epileptic syndromes with complex partial seizures, not intractable, without status epilepticus: Secondary | ICD-10-CM | POA: Diagnosis not present

## 2023-02-24 DIAGNOSIS — U071 COVID-19: Secondary | ICD-10-CM | POA: Diagnosis not present

## 2023-02-24 DIAGNOSIS — G3109 Other frontotemporal dementia: Secondary | ICD-10-CM | POA: Diagnosis not present

## 2023-02-24 DIAGNOSIS — J1282 Pneumonia due to coronavirus disease 2019: Secondary | ICD-10-CM | POA: Diagnosis not present

## 2023-02-24 DIAGNOSIS — I10 Essential (primary) hypertension: Secondary | ICD-10-CM | POA: Diagnosis not present

## 2023-02-25 ENCOUNTER — Ambulatory Visit: Payer: Medicare PPO | Admitting: Neurology

## 2023-02-25 DIAGNOSIS — B9620 Unspecified Escherichia coli [E. coli] as the cause of diseases classified elsewhere: Secondary | ICD-10-CM | POA: Diagnosis not present

## 2023-02-25 DIAGNOSIS — N390 Urinary tract infection, site not specified: Secondary | ICD-10-CM | POA: Diagnosis not present

## 2023-02-25 DIAGNOSIS — G9341 Metabolic encephalopathy: Secondary | ICD-10-CM | POA: Diagnosis not present

## 2023-02-25 DIAGNOSIS — F0280 Dementia in other diseases classified elsewhere without behavioral disturbance: Secondary | ICD-10-CM | POA: Diagnosis not present

## 2023-02-25 DIAGNOSIS — I10 Essential (primary) hypertension: Secondary | ICD-10-CM | POA: Diagnosis not present

## 2023-02-25 DIAGNOSIS — G40209 Localization-related (focal) (partial) symptomatic epilepsy and epileptic syndromes with complex partial seizures, not intractable, without status epilepticus: Secondary | ICD-10-CM | POA: Diagnosis not present

## 2023-02-25 DIAGNOSIS — J1282 Pneumonia due to coronavirus disease 2019: Secondary | ICD-10-CM | POA: Diagnosis not present

## 2023-02-25 DIAGNOSIS — U071 COVID-19: Secondary | ICD-10-CM | POA: Diagnosis not present

## 2023-02-25 DIAGNOSIS — G3109 Other frontotemporal dementia: Secondary | ICD-10-CM | POA: Diagnosis not present

## 2023-02-26 DIAGNOSIS — I4729 Other ventricular tachycardia: Secondary | ICD-10-CM | POA: Diagnosis not present

## 2023-02-26 DIAGNOSIS — F0280 Dementia in other diseases classified elsewhere without behavioral disturbance: Secondary | ICD-10-CM | POA: Diagnosis not present

## 2023-02-26 DIAGNOSIS — M4802 Spinal stenosis, cervical region: Secondary | ICD-10-CM | POA: Diagnosis not present

## 2023-02-26 DIAGNOSIS — M19041 Primary osteoarthritis, right hand: Secondary | ICD-10-CM | POA: Diagnosis not present

## 2023-02-26 DIAGNOSIS — M19042 Primary osteoarthritis, left hand: Secondary | ICD-10-CM | POA: Diagnosis not present

## 2023-02-26 DIAGNOSIS — L89151 Pressure ulcer of sacral region, stage 1: Secondary | ICD-10-CM | POA: Diagnosis not present

## 2023-02-26 DIAGNOSIS — G3109 Other frontotemporal dementia: Secondary | ICD-10-CM | POA: Diagnosis not present

## 2023-02-26 DIAGNOSIS — G40209 Localization-related (focal) (partial) symptomatic epilepsy and epileptic syndromes with complex partial seizures, not intractable, without status epilepticus: Secondary | ICD-10-CM | POA: Diagnosis not present

## 2023-02-26 DIAGNOSIS — M47819 Spondylosis without myelopathy or radiculopathy, site unspecified: Secondary | ICD-10-CM | POA: Diagnosis not present

## 2023-03-01 DIAGNOSIS — L89151 Pressure ulcer of sacral region, stage 1: Secondary | ICD-10-CM | POA: Diagnosis not present

## 2023-03-01 DIAGNOSIS — I4729 Other ventricular tachycardia: Secondary | ICD-10-CM | POA: Diagnosis not present

## 2023-03-01 DIAGNOSIS — G3109 Other frontotemporal dementia: Secondary | ICD-10-CM | POA: Diagnosis not present

## 2023-03-01 DIAGNOSIS — G40209 Localization-related (focal) (partial) symptomatic epilepsy and epileptic syndromes with complex partial seizures, not intractable, without status epilepticus: Secondary | ICD-10-CM | POA: Diagnosis not present

## 2023-03-01 DIAGNOSIS — F0280 Dementia in other diseases classified elsewhere without behavioral disturbance: Secondary | ICD-10-CM | POA: Diagnosis not present

## 2023-03-01 DIAGNOSIS — M4802 Spinal stenosis, cervical region: Secondary | ICD-10-CM | POA: Diagnosis not present

## 2023-03-01 DIAGNOSIS — M19042 Primary osteoarthritis, left hand: Secondary | ICD-10-CM | POA: Diagnosis not present

## 2023-03-01 DIAGNOSIS — M47819 Spondylosis without myelopathy or radiculopathy, site unspecified: Secondary | ICD-10-CM | POA: Diagnosis not present

## 2023-03-01 DIAGNOSIS — M19041 Primary osteoarthritis, right hand: Secondary | ICD-10-CM | POA: Diagnosis not present

## 2023-03-03 DIAGNOSIS — G40209 Localization-related (focal) (partial) symptomatic epilepsy and epileptic syndromes with complex partial seizures, not intractable, without status epilepticus: Secondary | ICD-10-CM | POA: Diagnosis not present

## 2023-03-03 DIAGNOSIS — G3109 Other frontotemporal dementia: Secondary | ICD-10-CM | POA: Diagnosis not present

## 2023-03-03 DIAGNOSIS — L89151 Pressure ulcer of sacral region, stage 1: Secondary | ICD-10-CM | POA: Diagnosis not present

## 2023-03-03 DIAGNOSIS — F0280 Dementia in other diseases classified elsewhere without behavioral disturbance: Secondary | ICD-10-CM | POA: Diagnosis not present

## 2023-03-03 DIAGNOSIS — M47819 Spondylosis without myelopathy or radiculopathy, site unspecified: Secondary | ICD-10-CM | POA: Diagnosis not present

## 2023-03-03 DIAGNOSIS — M4802 Spinal stenosis, cervical region: Secondary | ICD-10-CM | POA: Diagnosis not present

## 2023-03-03 DIAGNOSIS — M19042 Primary osteoarthritis, left hand: Secondary | ICD-10-CM | POA: Diagnosis not present

## 2023-03-03 DIAGNOSIS — M19041 Primary osteoarthritis, right hand: Secondary | ICD-10-CM | POA: Diagnosis not present

## 2023-03-03 DIAGNOSIS — I4729 Other ventricular tachycardia: Secondary | ICD-10-CM | POA: Diagnosis not present

## 2023-03-04 DIAGNOSIS — L89151 Pressure ulcer of sacral region, stage 1: Secondary | ICD-10-CM | POA: Diagnosis not present

## 2023-03-04 DIAGNOSIS — N390 Urinary tract infection, site not specified: Secondary | ICD-10-CM | POA: Diagnosis not present

## 2023-03-04 DIAGNOSIS — M4802 Spinal stenosis, cervical region: Secondary | ICD-10-CM | POA: Diagnosis not present

## 2023-03-04 DIAGNOSIS — I4729 Other ventricular tachycardia: Secondary | ICD-10-CM | POA: Diagnosis not present

## 2023-03-04 DIAGNOSIS — F0280 Dementia in other diseases classified elsewhere without behavioral disturbance: Secondary | ICD-10-CM | POA: Diagnosis not present

## 2023-03-04 DIAGNOSIS — M19042 Primary osteoarthritis, left hand: Secondary | ICD-10-CM | POA: Diagnosis not present

## 2023-03-04 DIAGNOSIS — G40209 Localization-related (focal) (partial) symptomatic epilepsy and epileptic syndromes with complex partial seizures, not intractable, without status epilepticus: Secondary | ICD-10-CM | POA: Diagnosis not present

## 2023-03-04 DIAGNOSIS — G3109 Other frontotemporal dementia: Secondary | ICD-10-CM | POA: Diagnosis not present

## 2023-03-04 DIAGNOSIS — M19041 Primary osteoarthritis, right hand: Secondary | ICD-10-CM | POA: Diagnosis not present

## 2023-03-04 DIAGNOSIS — M47819 Spondylosis without myelopathy or radiculopathy, site unspecified: Secondary | ICD-10-CM | POA: Diagnosis not present

## 2023-03-08 DIAGNOSIS — G3109 Other frontotemporal dementia: Secondary | ICD-10-CM | POA: Diagnosis not present

## 2023-03-08 DIAGNOSIS — L89151 Pressure ulcer of sacral region, stage 1: Secondary | ICD-10-CM | POA: Diagnosis not present

## 2023-03-08 DIAGNOSIS — M4802 Spinal stenosis, cervical region: Secondary | ICD-10-CM | POA: Diagnosis not present

## 2023-03-08 DIAGNOSIS — M19042 Primary osteoarthritis, left hand: Secondary | ICD-10-CM | POA: Diagnosis not present

## 2023-03-08 DIAGNOSIS — M47819 Spondylosis without myelopathy or radiculopathy, site unspecified: Secondary | ICD-10-CM | POA: Diagnosis not present

## 2023-03-08 DIAGNOSIS — I4729 Other ventricular tachycardia: Secondary | ICD-10-CM | POA: Diagnosis not present

## 2023-03-08 DIAGNOSIS — F0280 Dementia in other diseases classified elsewhere without behavioral disturbance: Secondary | ICD-10-CM | POA: Diagnosis not present

## 2023-03-08 DIAGNOSIS — G40209 Localization-related (focal) (partial) symptomatic epilepsy and epileptic syndromes with complex partial seizures, not intractable, without status epilepticus: Secondary | ICD-10-CM | POA: Diagnosis not present

## 2023-03-08 DIAGNOSIS — M19041 Primary osteoarthritis, right hand: Secondary | ICD-10-CM | POA: Diagnosis not present

## 2023-03-10 DIAGNOSIS — I4729 Other ventricular tachycardia: Secondary | ICD-10-CM | POA: Diagnosis not present

## 2023-03-10 DIAGNOSIS — F0280 Dementia in other diseases classified elsewhere without behavioral disturbance: Secondary | ICD-10-CM | POA: Diagnosis not present

## 2023-03-10 DIAGNOSIS — M47819 Spondylosis without myelopathy or radiculopathy, site unspecified: Secondary | ICD-10-CM | POA: Diagnosis not present

## 2023-03-10 DIAGNOSIS — M4802 Spinal stenosis, cervical region: Secondary | ICD-10-CM | POA: Diagnosis not present

## 2023-03-10 DIAGNOSIS — G3109 Other frontotemporal dementia: Secondary | ICD-10-CM | POA: Diagnosis not present

## 2023-03-10 DIAGNOSIS — L89151 Pressure ulcer of sacral region, stage 1: Secondary | ICD-10-CM | POA: Diagnosis not present

## 2023-03-10 DIAGNOSIS — G40209 Localization-related (focal) (partial) symptomatic epilepsy and epileptic syndromes with complex partial seizures, not intractable, without status epilepticus: Secondary | ICD-10-CM | POA: Diagnosis not present

## 2023-03-10 DIAGNOSIS — M19042 Primary osteoarthritis, left hand: Secondary | ICD-10-CM | POA: Diagnosis not present

## 2023-03-10 DIAGNOSIS — M19041 Primary osteoarthritis, right hand: Secondary | ICD-10-CM | POA: Diagnosis not present

## 2023-03-11 DIAGNOSIS — G40209 Localization-related (focal) (partial) symptomatic epilepsy and epileptic syndromes with complex partial seizures, not intractable, without status epilepticus: Secondary | ICD-10-CM | POA: Diagnosis not present

## 2023-03-11 DIAGNOSIS — J690 Pneumonitis due to inhalation of food and vomit: Secondary | ICD-10-CM | POA: Diagnosis not present

## 2023-03-11 DIAGNOSIS — M19042 Primary osteoarthritis, left hand: Secondary | ICD-10-CM | POA: Diagnosis not present

## 2023-03-11 DIAGNOSIS — M19041 Primary osteoarthritis, right hand: Secondary | ICD-10-CM | POA: Diagnosis not present

## 2023-03-11 DIAGNOSIS — L89151 Pressure ulcer of sacral region, stage 1: Secondary | ICD-10-CM | POA: Diagnosis not present

## 2023-03-11 DIAGNOSIS — M4802 Spinal stenosis, cervical region: Secondary | ICD-10-CM | POA: Diagnosis not present

## 2023-03-11 DIAGNOSIS — I4729 Other ventricular tachycardia: Secondary | ICD-10-CM | POA: Diagnosis not present

## 2023-03-11 DIAGNOSIS — F0280 Dementia in other diseases classified elsewhere without behavioral disturbance: Secondary | ICD-10-CM | POA: Diagnosis not present

## 2023-03-11 DIAGNOSIS — G3109 Other frontotemporal dementia: Secondary | ICD-10-CM | POA: Diagnosis not present

## 2023-03-11 DIAGNOSIS — M47819 Spondylosis without myelopathy or radiculopathy, site unspecified: Secondary | ICD-10-CM | POA: Diagnosis not present

## 2023-03-11 DIAGNOSIS — Z8616 Personal history of COVID-19: Secondary | ICD-10-CM | POA: Diagnosis not present

## 2023-03-12 ENCOUNTER — Encounter (HOSPITAL_COMMUNITY): Payer: Self-pay | Admitting: Emergency Medicine

## 2023-03-12 ENCOUNTER — Other Ambulatory Visit: Payer: Self-pay

## 2023-03-12 ENCOUNTER — Emergency Department (HOSPITAL_COMMUNITY): Payer: Medicare PPO

## 2023-03-12 ENCOUNTER — Observation Stay (HOSPITAL_COMMUNITY)
Admission: EM | Admit: 2023-03-12 | Discharge: 2023-03-13 | Disposition: A | Discharge status: Home / Self Care | Payer: Medicare PPO | Attending: Internal Medicine | Admitting: Internal Medicine

## 2023-03-12 DIAGNOSIS — I472 Ventricular tachycardia, unspecified: Secondary | ICD-10-CM | POA: Diagnosis present

## 2023-03-12 DIAGNOSIS — R8290 Unspecified abnormal findings in urine: Secondary | ICD-10-CM | POA: Insufficient documentation

## 2023-03-12 DIAGNOSIS — N390 Urinary tract infection, site not specified: Secondary | ICD-10-CM | POA: Diagnosis not present

## 2023-03-12 DIAGNOSIS — Z8616 Personal history of COVID-19: Secondary | ICD-10-CM | POA: Diagnosis not present

## 2023-03-12 DIAGNOSIS — R059 Cough, unspecified: Secondary | ICD-10-CM | POA: Diagnosis not present

## 2023-03-12 DIAGNOSIS — J9601 Acute respiratory failure with hypoxia: Secondary | ICD-10-CM | POA: Diagnosis not present

## 2023-03-12 DIAGNOSIS — G40909 Epilepsy, unspecified, not intractable, without status epilepticus: Secondary | ICD-10-CM

## 2023-03-12 DIAGNOSIS — Z96642 Presence of left artificial hip joint: Secondary | ICD-10-CM | POA: Diagnosis not present

## 2023-03-12 DIAGNOSIS — F0390 Unspecified dementia without behavioral disturbance: Secondary | ICD-10-CM | POA: Insufficient documentation

## 2023-03-12 DIAGNOSIS — R509 Fever, unspecified: Secondary | ICD-10-CM | POA: Diagnosis not present

## 2023-03-12 DIAGNOSIS — E039 Hypothyroidism, unspecified: Secondary | ICD-10-CM | POA: Diagnosis not present

## 2023-03-12 DIAGNOSIS — R0602 Shortness of breath: Secondary | ICD-10-CM | POA: Diagnosis not present

## 2023-03-12 DIAGNOSIS — Z79899 Other long term (current) drug therapy: Secondary | ICD-10-CM | POA: Insufficient documentation

## 2023-03-12 DIAGNOSIS — J9621 Acute and chronic respiratory failure with hypoxia: Secondary | ICD-10-CM | POA: Diagnosis present

## 2023-03-12 DIAGNOSIS — Z1152 Encounter for screening for COVID-19: Secondary | ICD-10-CM | POA: Insufficient documentation

## 2023-03-12 DIAGNOSIS — I700 Atherosclerosis of aorta: Secondary | ICD-10-CM | POA: Diagnosis not present

## 2023-03-12 DIAGNOSIS — R0902 Hypoxemia: Secondary | ICD-10-CM | POA: Diagnosis not present

## 2023-03-12 DIAGNOSIS — R531 Weakness: Secondary | ICD-10-CM | POA: Diagnosis not present

## 2023-03-12 LAB — URINALYSIS, W/ REFLEX TO CULTURE (INFECTION SUSPECTED)
Bilirubin Urine: NEGATIVE
Glucose, UA: NEGATIVE mg/dL
Hgb urine dipstick: NEGATIVE
Ketones, ur: NEGATIVE mg/dL
Nitrite: NEGATIVE
Protein, ur: NEGATIVE mg/dL
Specific Gravity, Urine: 1.006 (ref 1.005–1.030)
WBC, UA: >50 WBC/hpf (ref 0–5)
pH: 6 (ref 5.0–8.0)

## 2023-03-12 LAB — COMPREHENSIVE METABOLIC PANEL
ALT: 10 U/L (ref 0–44)
AST: 17 U/L (ref 15–41)
Albumin: 1.9 g/dL — ABNORMAL LOW (ref 3.5–5.0)
Alkaline Phosphatase: 54 U/L (ref 38–126)
Anion gap: 11 (ref 5–15)
BUN: 12 mg/dL (ref 8–23)
CO2: 26 mmol/L (ref 22–32)
Calcium: 8.4 mg/dL — ABNORMAL LOW (ref 8.9–10.3)
Chloride: 96 mmol/L — ABNORMAL LOW (ref 98–111)
Creatinine, Ser: 0.58 mg/dL (ref 0.44–1.00)
GFR, Estimated: >60 mL/min (ref >60)
Glucose, Bld: 96 mg/dL (ref 70–99)
Potassium: 4 mmol/L (ref 3.5–5.1)
Sodium: 133 mmol/L — ABNORMAL LOW (ref 135–145)
Total Bilirubin: 0.5 mg/dL (ref 0.3–1.2)
Total Protein: 4.9 g/dL — ABNORMAL LOW (ref 6.5–8.1)

## 2023-03-12 LAB — CBC WITH DIFFERENTIAL/PLATELET
Abs Immature Granulocytes: 0.1 10*3/uL — ABNORMAL HIGH (ref 0.00–0.07)
Basophils Absolute: 0 10*3/uL (ref 0.0–0.1)
Basophils Relative: 0 %
Eosinophils Absolute: 0 10*3/uL (ref 0.0–0.5)
Eosinophils Relative: 1 %
HCT: 34.2 % — ABNORMAL LOW (ref 36.0–46.0)
Hemoglobin: 11.3 g/dL — ABNORMAL LOW (ref 12.0–15.0)
Immature Granulocytes: 2 %
Lymphocytes Relative: 33 %
Lymphs Abs: 2 10*3/uL (ref 0.7–4.0)
MCH: 32.9 pg (ref 26.0–34.0)
MCHC: 33 g/dL (ref 30.0–36.0)
MCV: 99.7 fL (ref 80.0–100.0)
Monocytes Absolute: 0.8 10*3/uL (ref 0.1–1.0)
Monocytes Relative: 12 %
Neutro Abs: 3.2 10*3/uL (ref 1.7–7.7)
Neutrophils Relative %: 52 %
Platelets: 165 10*3/uL (ref 150–400)
RBC: 3.43 MIL/uL — ABNORMAL LOW (ref 3.87–5.11)
RDW: 15.1 % (ref 11.5–15.5)
WBC: 6.1 10*3/uL (ref 4.0–10.5)
nRBC: 0 % (ref 0.0–0.2)

## 2023-03-12 LAB — PROTIME-INR
INR: 1 (ref 0.8–1.2)
Prothrombin Time: 13.5 seconds (ref 11.4–15.2)

## 2023-03-12 LAB — TROPONIN I (HIGH SENSITIVITY)
Troponin I (High Sensitivity): 4 ng/L (ref <18)
Troponin I (High Sensitivity): 5 ng/L (ref <18)

## 2023-03-12 LAB — RESP PANEL BY RT-PCR (RSV, FLU A&B, COVID)  RVPGX2
Influenza A by PCR: NEGATIVE
Influenza B by PCR: NEGATIVE
Resp Syncytial Virus by PCR: NEGATIVE
SARS Coronavirus 2 by RT PCR: NEGATIVE

## 2023-03-12 LAB — LACTIC ACID, PLASMA
Lactic Acid, Venous: 1 mmol/L (ref 0.5–1.9)
Lactic Acid, Venous: 1.2 mmol/L (ref 0.5–1.9)

## 2023-03-12 LAB — BRAIN NATRIURETIC PEPTIDE: B Natriuretic Peptide: 30.8 pg/mL (ref 0.0–100.0)

## 2023-03-12 LAB — APTT: aPTT: 27 seconds (ref 24–36)

## 2023-03-12 LAB — D-DIMER, QUANTITATIVE: D-Dimer, Quant: 1.94 ug/mL-FEU — ABNORMAL HIGH (ref 0.00–0.50)

## 2023-03-12 MED ORDER — LACTATED RINGERS IV SOLN: INTRAVENOUS | Status: DC

## 2023-03-12 MED ORDER — DIVALPROEX SODIUM ER 500 MG PO TB24
1000.0000 mg | ORAL_TABLET | Freq: Every day | ORAL | Status: DC
  Administered 2023-03-12: 1000 mg via ORAL
  Filled 2023-03-12 (×2): qty 2

## 2023-03-12 MED ORDER — PANTOPRAZOLE SODIUM 20 MG PO TBEC
20.0000 mg | DELAYED_RELEASE_TABLET | Freq: Every day | ORAL | Status: DC
  Administered 2023-03-13: 20 mg via ORAL
  Filled 2023-03-12: qty 1

## 2023-03-12 MED ORDER — ACETAMINOPHEN 650 MG RE SUPP: 650.0000 mg | Freq: Four times a day (QID) | RECTAL | Status: DC | PRN

## 2023-03-12 MED ORDER — CLOBAZAM 10 MG PO TABS: 10.0000 mg | ORAL_TABLET | Freq: Two times a day (BID) | ORAL | Status: DC

## 2023-03-12 MED ORDER — MELATONIN 5 MG PO TABS: 10.0000 mg | ORAL_TABLET | Freq: Every evening | ORAL | Status: DC | PRN

## 2023-03-12 MED ORDER — IOHEXOL 350 MG/ML SOLN
75.0000 mL | Freq: Once | INTRAVENOUS | Status: AC | PRN
  Administered 2023-03-12: 75 mL via INTRAVENOUS

## 2023-03-12 MED ORDER — CLOBAZAM 10 MG PO TABS
10.0000 mg | ORAL_TABLET | Freq: Every morning | ORAL | Status: DC
  Administered 2023-03-13: 10 mg via ORAL
  Filled 2023-03-12: qty 1

## 2023-03-12 MED ORDER — SODIUM CHLORIDE 0.9% FLUSH
3.0000 mL | Freq: Two times a day (BID) | INTRAVENOUS | Status: DC
  Administered 2023-03-12 – 2023-03-13 (×3): 3 mL via INTRAVENOUS

## 2023-03-12 MED ORDER — SODIUM CHLORIDE 0.9 % IV SOLN
500.0000 mg | INTRAVENOUS | Status: DC
  Administered 2023-03-12: 500 mg via INTRAVENOUS
  Filled 2023-03-12 (×2): qty 5

## 2023-03-12 MED ORDER — CLOBAZAM 5 MG PO HALF TABLET
15.0000 mg | ORAL_TABLET | Freq: Every day | ORAL | Status: DC
  Administered 2023-03-12: 15 mg via ORAL
  Filled 2023-03-12: qty 1

## 2023-03-12 MED ORDER — PROPAFENONE HCL ER 225 MG PO CP12
225.0000 mg | ORAL_CAPSULE | Freq: Two times a day (BID) | ORAL | Status: DC
  Administered 2023-03-12 – 2023-03-13 (×2): 225 mg via ORAL
  Filled 2023-03-12 (×3): qty 1

## 2023-03-12 MED ORDER — POLYETHYLENE GLYCOL 3350 17 G PO PACK: 17.0000 g | PACK | Freq: Every day | ORAL | Status: DC | PRN

## 2023-03-12 MED ORDER — SODIUM CHLORIDE 0.9 % IV SOLN
1.0000 g | INTRAVENOUS | Status: DC
  Administered 2023-03-12 – 2023-03-13 (×2): 1 g via INTRAVENOUS
  Filled 2023-03-12 (×2): qty 10

## 2023-03-12 MED ORDER — LACTATED RINGERS IV BOLUS (SEPSIS)
1000.0000 mL | Freq: Once | INTRAVENOUS | Status: AC
  Administered 2023-03-12: 1000 mL via INTRAVENOUS

## 2023-03-12 MED ORDER — ENOXAPARIN SODIUM 40 MG/0.4ML IJ SOSY
40.0000 mg | PREFILLED_SYRINGE | INTRAMUSCULAR | Status: DC
  Administered 2023-03-12 – 2023-03-13 (×2): 40 mg via SUBCUTANEOUS
  Filled 2023-03-12 (×2): qty 0.4

## 2023-03-12 MED ORDER — ACETAMINOPHEN 325 MG PO TABS
650.0000 mg | ORAL_TABLET | Freq: Four times a day (QID) | ORAL | Status: DC | PRN
  Administered 2023-03-13: 650 mg via ORAL
  Filled 2023-03-12: qty 2

## 2023-03-12 MED ORDER — ALBUTEROL SULFATE (2.5 MG/3ML) 0.083% IN NEBU: 2.5000 mg | INHALATION_SOLUTION | RESPIRATORY_TRACT | Status: DC | PRN

## 2023-03-12 MED ORDER — LEVOTHYROXINE SODIUM 50 MCG PO TABS
50.0000 ug | ORAL_TABLET | Freq: Every day | ORAL | Status: DC
  Administered 2023-03-13: 50 ug via ORAL
  Filled 2023-03-12: qty 1

## 2023-03-12 NOTE — ED Notes (Signed)
ED TO INPATIENT HANDOFF REPORT  ED Nurse Name and Phone #: Josh   S Name/Age/Gender [name] 67 y.o. female Room/Bed: 030C/030C  Code Status   Code Status: Full Code  Home/SNF/Other Home Patient oriented to: self, place, and situation Is this baseline? Yes      Chief Complaint Acute respiratory failure with hypoxia ([place]) [J96.01]  Triage Note Pt BIB [facility] for respiratory distress. Coughing began 2 days ago.  Palliative nurse came by yesterday and tried to get a doctor order for tx plan without success.  This morning pt was febrile at 101 per husband and she was very congested and 02 sats began to drop so he calle 911.    EMS endorsed pt at 88-92% on scene with significant Rhonchi.  EMS gave 10 Alb, 0.5 Atrovent, 2G Mag, 125 Solumedrol.  Hot to touch. BP 126/70, 90% on aerosol mask, HR 88, ETC02 44 CBG 99   Allergies No Known Allergies  Level of Care/Admitting Diagnosis ED Disposition     ED Disposition  Admit   Condition  --   Comment  Hospital Area: [facility] [[id number]]  Level of Care: Telemetry Medical [104]  May place patient in observation at [facility] or [place] if equivalent level of care is available:: No  Covid Evaluation: Confirmed COVID Negative  Diagnosis: Acute respiratory failure with hypoxia [facility]) [id number]  Admitting Physician: Marcelyn Bruins [id number]  Attending Physician: Marcelyn Bruins [id number]          B Medical/Surgery History Past Medical History:  Diagnosis Date   Altered mental status 01/18/2023   Arthritis lower back/hands   Atypical chest pain 04/15/2017   Atypical chest pain   Brain injury ([place])    Cancer ([place]) breast tumor removed   COVID-19 02/02/2023   Dementia ([place])    PSD-FTD   Disorientation 01/18/2023   Hip fracture ([place]) 09/06/2019   History of epilepsy 02/02/2023   Leg fracture    Seizure disorder ([place])    Seizures ([place])    Sepsis ([place]) 01/11/2023   VT (ventricular  tachycardia) ([place]) 04/29/2017   Past Surgical History:  Procedure Laterality Date   BREAST SURGERY     FACIAL FRACTURE SURGERY  orbital bone repair   LEFT HEART CATH AND CORONARY ANGIOGRAPHY N/A 04/30/2017   Procedure: Left Heart Cath and Coronary Angiography;  Surgeon: Leonie Man, MD;  Location: [facility];  Service: Cardiovascular;  Laterality: N/A;   TOTAL HIP ARTHROPLASTY Left 09/07/2019   Procedure: TOTAL HIP ARTHROPLASTY ANTERIOR APPROACH;  Surgeon: Rod Can, MD;  Location: [facility];  Service: Orthopedics;  Laterality: Left;     A IV Location/Drains/Wounds Patient Lines/Drains/Airways Status     Active Line/Drains/Airways     Name Placement date Placement time Site Days   Peripheral IV 03/12/23 Left;Posterior Hand 03/12/23  0716  Hand  less than 1   Peripheral IV 03/12/23 20 G Left Antecubital 03/12/23  1056  Antecubital  less than 1   Wound / Incision (Open or Dehisced) 02/03/23 Skin tear Coccyx Right;Left Pt has 2 open blisters on Left and Right Coccyx area, blanchable, 02/03/23  1209  Coccyx  37            Intake/Output Last 24 hours  Intake/Output Summary (Last 24 hours) at 03/12/2023 1247 Last data filed at 03/12/2023 1054 Gross per 24 hour  Intake 3000 ml  Output --  Net 3000 ml    Labs/Imaging Results for orders placed or  performed during the hospital encounter of 03/12/23 (from the past 48 hour(s))  Resp panel by RT-PCR (RSV, Flu A&B, Covid) Anterior Nasal Swab     Status: None   Collection Time: 03/12/23  7:14 AM   Specimen: Anterior Nasal Swab  Result Value Ref Range   SARS Coronavirus 2 by RT PCR NEGATIVE NEGATIVE   Influenza A by PCR NEGATIVE NEGATIVE   Influenza B by PCR NEGATIVE NEGATIVE    Comment: (NOTE) The Xpert Xpress SARS-CoV-2/FLU/RSV plus assay is intended as an aid in the diagnosis of influenza from Nasopharyngeal swab specimens and should not be used as a sole basis for treatment. Nasal washings and aspirates are  unacceptable for Xpert Xpress SARS-CoV-2/FLU/RSV testing.  Fact Sheet for Patients: EntrepreneurPulse.com.au  Fact Sheet for Healthcare Providers: IncredibleEmployment.be  This test is not yet approved or cleared by the [place] FDA and has been authorized for detection and/or diagnosis of SARS-CoV-2 by FDA under an Emergency Use Authorization (EUA). This EUA will remain in effect (meaning this test can be used) for the duration of the COVID-19 declaration under Section 564(b)(1) of the Act, 21 U.S.C. section 360bbb-3(b)(1), unless the authorization is terminated or revoked.     Resp Syncytial Virus by PCR NEGATIVE NEGATIVE    Comment: (NOTE) Fact Sheet for Patients: EntrepreneurPulse.com.au  Fact Sheet for Healthcare Providers: IncredibleEmployment.be  This test is not yet approved or cleared by the [place] FDA and has been authorized for detection and/or diagnosis of SARS-CoV-2 by FDA under an Emergency Use Authorization (EUA). This EUA will remain in effect (meaning this test can be used) for the duration of the COVID-19 declaration under Section 564(b)(1) of the Act, 21 U.S.C. section 360bbb-3(b)(1), unless the authorization is terminated or revoked.  Performed at [facility], [place] [address], [place], [place] [address]   Blood Culture (routine x 2)     Status: None (Preliminary result)   Collection Time: 03/12/23  7:14 AM   Specimen: BLOOD  Result Value Ref Range   Specimen Description BLOOD BLOOD RIGHT FOREARM    Special Requests      BOTTLES DRAWN AEROBIC AND ANAEROBIC Blood Culture results may not be optimal due to an inadequate volume of blood received in culture bottles   Culture      NO GROWTH <12 HOURS Performed at [facility], [place] [address], [place], [place] [address]    Report Status PENDING   Urinalysis, w/ Reflex to Culture (Infection Suspected) -Urine, Clean  Catch     Status: Abnormal   Collection Time: 03/12/23  7:14 AM  Result Value Ref Range   Specimen Source URINE, CLEAN CATCH    Color, Urine YELLOW YELLOW   APPearance CLOUDY (A) CLEAR   Specific Gravity, Urine 1.006 1.005 - 1.030   pH 6.0 5.0 - 8.0   Glucose, UA NEGATIVE NEGATIVE mg/dL   Hgb urine dipstick NEGATIVE NEGATIVE   Bilirubin Urine NEGATIVE NEGATIVE   Ketones, ur NEGATIVE NEGATIVE mg/dL   Protein, ur NEGATIVE NEGATIVE mg/dL   Nitrite NEGATIVE NEGATIVE   Leukocytes,Ua LARGE (A) NEGATIVE   RBC / HPF 0-5 0 - 5 RBC/hpf   WBC, UA >50 0 - 5 WBC/hpf    Comment:        Reflex urine culture not performed if WBC <=10, OR if Squamous epithelial cells >5. If Squamous epithelial cells >5 suggest recollection.    Bacteria, UA FEW (A) NONE SEEN   Squamous Epithelial / HPF 0-5 0 -  5 /HPF   Non Squamous Epithelial 0-5 (A) NONE SEEN    Comment: Performed at [facility], [place] [address], [place], [place] [address]  Lactic acid, plasma     Status: None   Collection Time: 03/12/23  7:20 AM  Result Value Ref Range   Lactic Acid, Venous 1.0 0.5 - 1.9 mmol/L    Comment: Performed at [facility] [address] [place], [place] [address]  Comprehensive metabolic panel     Status: Abnormal   Collection Time: 03/12/23  7:20 AM  Result Value Ref Range   Sodium 133 (L) 135 - 145 mmol/L   Potassium 4.0 3.5 - 5.1 mmol/L   Chloride 96 (L) 98 - 111 mmol/L   CO2 26 22 - 32 mmol/L   Glucose, Bld 96 70 - 99 mg/dL    Comment: Glucose reference range applies only to samples taken after fasting for at least 8 hours.   BUN 12 8 - 23 mg/dL   Creatinine, Ser 0.58 0.44 - 1.00 mg/dL   Calcium 8.4 (L) 8.9 - 10.3 mg/dL   Total Protein 4.9 (L) 6.5 - 8.1 g/dL   Albumin 1.9 (L) 3.5 - 5.0 g/dL   AST 17 15 - 41 U/L   ALT 10 0 - 44 U/L   Alkaline Phosphatase 54 38 - 126 U/L   Total Bilirubin 0.5 0.3 - 1.2 mg/dL   GFR, Estimated >60 >60 mL/min    Comment: (NOTE) Calculated using the CKD-EPI  Creatinine Equation (2021)    Anion gap 11 5 - 15    Comment: Performed at [facility], [place] [address] [place], [place] [address]  CBC with Differential     Status: Abnormal   Collection Time: 03/12/23  7:20 AM  Result Value Ref Range   WBC 6.1 4.0 - 10.5 K/uL   RBC 3.43 (L) 3.87 - 5.11 MIL/uL   Hemoglobin 11.3 (L) 12.0 - 15.0 g/dL   HCT 34.2 (L) 36.0 - 46.0 %   MCV 99.7 80.0 - 100.0 fL   MCH 32.9 26.0 - 34.0 pg   MCHC 33.0 30.0 - 36.0 g/dL   RDW 15.1 11.5 - 15.5 %   Platelets 165 150 - 400 K/uL   nRBC 0.0 0.0 - 0.2 %   Neutrophils Relative % 52 %   Neutro Abs 3.2 1.7 - 7.7 K/uL   Lymphocytes Relative 33 %   Lymphs Abs 2.0 0.7 - 4.0 K/uL   Monocytes Relative 12 %   Monocytes Absolute 0.8 0.1 - 1.0 K/uL   Eosinophils Relative 1 %   Eosinophils Absolute 0.0 0.0 - 0.5 K/uL   Basophils Relative 0 %   Basophils Absolute 0.0 0.0 - 0.1 K/uL   Immature Granulocytes 2 %   Abs Immature Granulocytes 0.10 (H) 0.00 - 0.07 K/uL    Comment: Performed at [facility] [facility] [address], [place], [place] [address]  Protime-INR     Status: None   Collection Time: 03/12/23  7:20 AM  Result Value Ref Range   Prothrombin Time 13.5 11.4 - 15.2 seconds   INR 1.0 0.8 - 1.2    Comment: (NOTE) INR goal varies based on device and disease states. Performed at [facility], [place] [address] [place],  [address]   APTT     Status: None   Collection Time: 03/12/23  7:20 AM  Result Value Ref Range   aPTT 27 24 - 36 seconds  Comment: Performed at [facility], [place] [address], [place], [place] [address]  Brain natriuretic peptide     Status: None   Collection Time: 03/12/23  7:20 AM  Result Value Ref Range   B Natriuretic Peptide 30.8 0.0 - 100.0 pg/mL    Comment: Performed at [place] [address], [place], [place] [address]  Troponin I (High Sensitivity)     Status: None   Collection Time: 03/12/23  7:20 AM  Result Value Ref Range   Troponin I (High  Sensitivity) 4 <18 ng/L    Comment: (NOTE) Elevated high sensitivity troponin I (hsTnI) values and significant  changes across serial measurements may suggest ACS but many other  chronic and acute conditions are known to elevate hsTnI results.  Refer to the "Links" section for chest pain algorithms and additional  guidance. Performed at [facility], [place] [address] [place], [place] [address]   D-dimer, quantitative     Status: Abnormal   Collection Time: 03/12/23  7:32 AM  Result Value Ref Range   D-Dimer, Quant 1.94 (H) 0.00 - 0.50 ug/mL-FEU    Comment: (NOTE) At the manufacturer cut-off value of 0.5 g/mL FEU, this assay has a negative predictive value of 95-100%.This assay is intended for use in conjunction with a clinical pretest probability (PTP) assessment model to exclude pulmonary embolism (PE) and deep venous thrombosis (DVT) in outpatients suspected of PE or DVT. Results should be correlated with clinical presentation. Performed at [facility], [place] [address] [place], [place] [address]   Troponin I (High Sensitivity)     Status: None   Collection Time: 03/12/23 10:53 AM  Result Value Ref Range   Troponin I (High Sensitivity) 5 <18 ng/L    Comment: (NOTE) Elevated high sensitivity troponin I (hsTnI) values and significant  changes across serial measurements may suggest ACS but many other  chronic and acute conditions are known to elevate hsTnI results.  Refer to the "Links" section for chest pain algorithms and additional  guidance. Performed at [facility], [place] [address] [place], [place] [address]   Lactic acid, plasma     Status: None   Collection Time: 03/12/23 10:55 AM  Result Value Ref Range   Lactic Acid, Venous 1.2 0.5 - 1.9 mmol/L    Comment: Performed at [facility] [address], [place], [place] [address]   CT Angio Chest PE W/Cm &/Or Wo Cm  Result Date: 03/12/2023 CLINICAL DATA:  Pulmonary embolism suspected, high  probability. EXAM: CT ANGIOGRAPHY CHEST WITH CONTRAST TECHNIQUE: Multidetector CT imaging of the chest was performed using the standard protocol during bolus administration of intravenous contrast. Multiplanar CT image reconstructions and MIPs were obtained to evaluate the vascular anatomy. RADIATION DOSE REDUCTION: This exam was performed according to the departmental dose-optimization program which includes automated exposure control, adjustment of the mA and/or kV according to patient size and/or use of iterative reconstruction technique. CONTRAST:  82mL OMNIPAQUE IOHEXOL 350 MG/ML SOLN COMPARISON:  Radiographs 03/12/2023. Chest CTA 02/02/2023 and 01/17/2023. FINDINGS: Cardiovascular: The pulmonary arteries are well opacified with contrast to the level of the subsegmental branches. There is no evidence of acute pulmonary embolism. Mild aortic and great vessel atherosclerosis without acute systemic arterial abnormalities. The heart size is normal. There is no pericardial effusion. Mediastinum/Nodes: There are no enlarged mediastinal, hilar or axillary lymph nodes. The thyroid gland, trachea and esophagus demonstrate no significant findings. Lungs/Pleura: Trace pleural effusion on the  right. No pneumothorax. There are chronic dependent airspace opacities at both lung bases. In the right lower lobe, this has worsened from the most recent prior study with associated consolidation and volume loss. The left basilar components have not significantly changed. Mild mosaic attenuation throughout the aerated lungs. No suspicious pulmonary nodules. Upper abdomen: The visualized upper abdomen appears stable, without significant findings. Musculoskeletal/Chest wall: There is no chest wall mass or suspicious osseous finding. Numerous mild thoracic compression deformities are unchanged. Review of the MIP images confirms the above findings. IMPRESSION: 1. No evidence of acute pulmonary embolism or other acute vascular findings in  the chest. 2. Chronic dependent airspace opacities at both lung bases, right greater than left. The right lower lobe components have worsened from the most recent prior study and could reflect atelectasis or pneumonia. Consider chronic aspiration. 3. Mild mosaic attenuation throughout the aerated lungs, nonspecific, but can be seen with small airways disease. 4.  Aortic Atherosclerosis (ICD10-I70.0). Electronically Signed   By: Richardean Sale M.D.   On: 03/12/2023 11:49   DG Chest Port 1 View  Result Date: 03/12/2023 CLINICAL DATA:  Shortness of breath.  Questionable sepsis. EXAM: PORTABLE CHEST 1 VIEW COMPARISON:  Radiographs 02/02/2023 and 01/19/2023.  CT 02/02/2023. FINDINGS: 0728 hours. The heart size and mediastinal contours are stable. Interval improvement in previously demonstrated streaky bibasilar opacities, likely reflecting scarring or residual atelectasis. There is no edema, confluent airspace opacity or pneumothorax. Possible small bilateral pleural effusions. The bones appear unremarkable. Telemetry leads overlie the chest. IMPRESSION: Interval improved aeration of the lung bases. Possible small bilateral pleural effusions. No evidence of pneumonia. Electronically Signed   By: Richardean Sale M.D.   On: 03/12/2023 07:37    Pending Labs Unresulted Labs (From admission, onward)     Start     Ordered   03/19/23 0500  Creatinine, serum  (enoxaparin (LOVENOX)    CrCl >/= 30 ml/min)  Weekly,   R     Comments: while on enoxaparin therapy    03/12/23 1230   03/13/23 0500  Comprehensive metabolic panel  Tomorrow morning,   R        03/12/23 1230   03/13/23 0500  CBC  Tomorrow morning,   R        03/12/23 1230   03/12/23 1231  Procalcitonin  Daily,   R     References:    Procalcitonin Lower Respiratory Tract Infection AND Sepsis Procalcitonin Algorithm   03/12/23 1230   03/12/23 1227  Respiratory (~20 pathogens) panel by PCR  (Respiratory panel by PCR (~20 pathogens, ~24 hr TAT)  w  precautions)  Add-on,   AD        03/12/23 1230   03/12/23 0714  Blood Culture (routine x 2)  (Septic presentation on arrival (screening labs, nursing and treatment orders for obvious sepsis))  BLOOD CULTURE X 2,   STAT      03/12/23 0714   03/12/23 0714  Urine Culture  Once,   R        03/12/23 0714            Vitals/Pain Today's Vitals   03/12/23 0803 03/12/23 1100 03/12/23 1200 03/12/23 1230  BP:  108/84 130/87 109/67  Pulse:  84 86 86  Resp:  16 13 19   Temp: 98.7 F (37.1 C) 98.1 F (36.7 C)    TempSrc: Rectal     SpO2:  95% 97% 99%  PainSc:        Isolation Precautions  Droplet precaution  Medications Medications  lactated ringers infusion ( Intravenous New Bag/Given 03/12/23 1102)  azithromycin (ZITHROMAX) 500 mg in sodium chloride 0.9 % 250 mL IVPB (has no administration in time range)  cefTRIAXone (ROCEPHIN) 1 g in sodium chloride 0.9 % 100 mL IVPB (has no administration in time range)  propafenone (RYTHMOL SR) 12 hr capsule 225 mg (has no administration in time range)  levothyroxine (SYNTHROID) tablet 50 mcg (has no administration in time range)  pantoprazole (PROTONIX) EC tablet 20 mg (has no administration in time range)  melatonin tablet 10 mg (has no administration in time range)  cloBAZam (ONFI) tablet 10 mg (has no administration in time range)  divalproex (DEPAKOTE ER) 24 hr tablet 1,000 mg (has no administration in time range)  enoxaparin (LOVENOX) injection 40 mg (has no administration in time range)  sodium chloride flush (NS) 0.9 % injection 3 mL (has no administration in time range)  acetaminophen (TYLENOL) tablet 650 mg (has no administration in time range)    Or  acetaminophen (TYLENOL) suppository 650 mg (has no administration in time range)  polyethylene glycol (MIRALAX / GLYCOLAX) packet 17 g (has no administration in time range)  albuterol (PROVENTIL) (2.5 MG/3ML) 0.083% nebulizer solution 2.5 mg (has no administration in time range)  lactated  ringers bolus 1,000 mL (0 mLs Intravenous Stopped 03/12/23 1054)    And  lactated ringers bolus 1,000 mL (0 mLs Intravenous Stopped 03/12/23 1054)    And  lactated ringers bolus 1,000 mL (0 mLs Intravenous Stopped 03/12/23 1054)  iohexol (OMNIPAQUE) 350 MG/ML injection 75 mL (75 mLs Intravenous Contrast Given 03/12/23 1134)    Mobility non-ambulatory     Focused Assessments Pulmonary Assessment Handoff:  Lung sounds:   O2 Device: Room Air Occassional Rhonchi    R Recommendations: See Admitting Provider Note  Report given to:   Additional Notes:

## 2023-03-12 NOTE — ED Notes (Signed)
Patient transported to CT 

## 2023-03-12 NOTE — Progress Notes (Signed)
RT attempted ABG x3 with help of doppler and was unsuccessful.MD aware.

## 2023-03-12 NOTE — Sepsis Progress Note (Addendum)
Notified provider of need to order antibiotics.  Secure chat with Dr Zenia Resides, who stated it was ok to cancel the entire Code Sepsis.

## 2023-03-12 NOTE — ED Notes (Signed)
Floor notified of pt transport.

## 2023-03-12 NOTE — ED Provider Notes (Signed)
[facility] Provider Note   CSN: [id number] Arrival date & time: 03/12/23  [id number]     History  No chief complaint on file.   [name] is a 67 y.o. female.  67 year old female presents short of breath from home.  Apparently symptoms began this morning.  Warm to the touch.  History is limited due to her current severe state.  Family called EMS and patient found to be in respiratory distress.  Patient given albuterol, Solu-Medrol, Atrovent and magnesium due to bronchospasm and transported here.       Home Medications Prior to Admission medications   Medication Sig Start Date End Date Taking? Authorizing Provider  acetaminophen (TYLENOL) 325 MG tablet Take 2 tablets (650 mg total) by mouth every 6 (six) hours as needed for mild pain, fever or headache. 01/22/23   Elgergawy, Silver Huguenin, MD  acetaminophen (TYLENOL) 500 MG tablet Take 500 mg by mouth every 6 (six) hours as needed for moderate pain or mild pain.    [provider]  CALCIUM PO Take 1 capsule by mouth daily.    [provider]  cloBAZam (ONFI) 10 MG tablet Take 1 tablet (10 mg) in the mornings and one and a half tablets (15 mg) at bedtime by moth daily.    [provider]  divalproex (DEPAKOTE ER) 500 MG 24 hr tablet Take 1,000 mg by mouth at bedtime.     [provider]  lansoprazole (PREVACID) 30 MG capsule Take 30 mg by mouth 2 (two) times daily. 06/10/22   [provider]  levothyroxine (SYNTHROID) 25 MCG tablet Take 2 tablets (50 mcg total) by mouth daily at 6 (six) AM. 02/06/23   Danford, Suann Larry, MD  Melatonin 10 MG CAPS Take 1 capsule by mouth at bedtime.    [provider]  polyethylene glycol (MIRALAX / GLYCOLAX) packet Take 17 g by mouth daily.    [provider]  propafenone (RYTHMOL SR) 225 MG 12 hr capsule TAKE 1 CAPSULE BY MOUTH TWICE A DAY Patient taking differently: Take 225 mg by mouth 2 (two)  times daily. 05/04/22   Lelon Perla, MD  trimethoprim (TRIMPEX) 100 MG tablet Take 100 mg by mouth daily. 11/30/22   [provider]  VITAMIN D PO Take 1 capsule by mouth daily.    [provider]      Allergies    Patient has no known allergies.    Review of Systems   Review of Systems  Unable to perform ROS: Acuity of condition    Physical Exam Updated Vital Signs There were no vitals taken for this visit. Physical Exam Vitals and nursing note reviewed.  Constitutional:      General: She is not in acute distress.    Appearance: Normal appearance. She is well-developed. She is not toxic-appearing.  HENT:     Head: Normocephalic and atraumatic.  Eyes:     General: Lids are normal.     Conjunctiva/sclera: Conjunctivae normal.     Pupils: Pupils are equal, round, and reactive to light.  Neck:     Thyroid: No thyroid mass.     Trachea: No tracheal deviation.  Cardiovascular:     Rate and Rhythm: Normal rate and regular rhythm.     Heart sounds: Normal heart sounds. No murmur heard.    No gallop.  Pulmonary:     Effort: Tachypnea and respiratory distress present.     Breath sounds: No  stridor. Examination of the right-upper field reveals decreased breath sounds. Examination of the left-upper field reveals decreased breath sounds. Decreased breath sounds present. No wheezing, rhonchi or rales.  Abdominal:     General: There is no distension.     Palpations: Abdomen is soft.     Tenderness: There is no abdominal tenderness. There is no rebound.  Musculoskeletal:        General: No tenderness. Normal range of motion.     Cervical back: Normal range of motion and neck supple.  Skin:    General: Skin is warm and dry.     Findings: No abrasion or rash.  Neurological:     Mental Status: She is alert and oriented to person, place, and time. Mental status is at baseline.     GCS: GCS eye subscore is 4. GCS verbal subscore is 5. GCS motor subscore is 6.      Cranial Nerves: No cranial nerve deficit.     Sensory: No sensory deficit.     Motor: Motor function is intact.  Psychiatric:        Attention and Perception: Attention normal.        Speech: Speech normal.        Behavior: Behavior normal.     ED Results / Procedures / Treatments   Labs (all labs ordered are listed, but only abnormal results are displayed) Labs Reviewed  RESP PANEL BY RT-PCR (RSV, FLU A&B, COVID)  [id number]  CULTURE, BLOOD (ROUTINE X 2)  CULTURE, BLOOD (ROUTINE X 2)  LACTIC ACID, PLASMA  LACTIC ACID, PLASMA  COMPREHENSIVE METABOLIC PANEL  CBC WITH DIFFERENTIAL/PLATELET  PROTIME-INR  APTT  URINALYSIS, ROUTINE W REFLEX MICROSCOPIC  URINALYSIS, W/ REFLEX TO CULTURE (INFECTION SUSPECTED)    EKG EKG Interpretation  Date/Time:  Friday March 12 2023 07:14:31 EDT Ventricular Rate:  86 PR Interval:  162 QRS Duration: 121 QT Interval:  344 QTC Calculation: 412 R Axis:   143 Text Interpretation: Right and left arm electrode reversal, interpretation assumes no reversal Sinus rhythm Nonspecific intraventricular conduction delay Probable lateral infarct, age indeterminate Confirmed by Lacretia Leigh ([id number]) on 03/12/2023 7:45:29 AM   Radiology No results found.  Procedures Procedures    Medications Ordered in ED Medications  lactated ringers infusion (has no administration in time range)  lactated ringers bolus 1,000 mL (has no administration in time range)    And  lactated ringers bolus 1,000 mL (has no administration in time range)    And  lactated ringers bolus 1,000 mL (has no administration in time range)    ED Course/ Medical Decision Making/ A&P                             Medical Decision Making Amount and/or Complexity of Data Reviewed Labs: ordered. Radiology: ordered. ECG/medicine tests: ordered.  Risk Prescription drug management. Decision regarding hospitalization.   9:38 AM Patient reassessed and respiratory status has improved.   Husband is at bedside and states that patient temperature of 101.2 degrees at home today.  Has been having increasing cough congestion.  The fever was not treated.  12:33 PM  Patient had CT of the chest due to increased D-dimer which was negative for PE.  Possible pneumonia.  Started on IV antibiotics.  Urinalysis consistent with infection.  No evidence of sepsis at this time.  Patient will require admission.  Discussed with hospitalist team.  Discussed with family over at bedside  CRITICAL  CARE Performed by: Leota Jacobsen Total critical care time: 45 minutes Critical care time was exclusive of separately billable procedures and treating other patients. Critical care was necessary to treat or prevent imminent or life-threatening deterioration. Critical care was time spent personally by me on the following activities: development of treatment plan with patient and/or surrogate as well as nursing, discussions with consultants, evaluation of patient's response to treatment, examination of patient, obtaining history from patient or surrogate, ordering and performing treatments and interventions, ordering and review of laboratory studies, ordering and review of radiographic studies, pulse oximetry and re-evaluation of patient's condition.        Final Clinical Impression(s) / ED Diagnoses Final diagnoses:  None    Rx / DC Orders ED Discharge Orders     None         Lacretia Leigh, MD 03/12/23 1238

## 2023-03-12 NOTE — Sepsis Progress Note (Signed)
eLink is following this Code Sepsis. °

## 2023-03-12 NOTE — ED Triage Notes (Signed)
Pt BIB [facility] for respiratory distress. Coughing began 2 days ago.  Palliative nurse came by yesterday and tried to get a doctor order for tx plan without success.  This morning pt was febrile at 101 per husband and she was very congested and 02 sats began to drop so he calle 911.    EMS endorsed pt at 88-92% on scene with significant Rhonchi.  EMS gave 10 Alb, 0.5 Atrovent, 2G Mag, 125 Solumedrol.  Hot to touch. BP 126/70, 90% on aerosol mask, HR 88, ETC02 44 CBG 99

## 2023-03-12 NOTE — H&P (Addendum)
History and Physical   [name] [id number] DOB: 1956/12/27 DOA: 03/12/2023  PCP: Lurline Del, DO   Patient coming from: Home  Chief Complaint: Shortness of breath  HPI: [name] is a 67 y.o. female with medical history significant of seizure disorder, hypothyroidism, ventricular tachycardia, dementia presenting with shortness of breath.  Patient history provided with assistance of family and chart review given her dementia.  She has had 2 days of cough.  Was seen by palliative care nurse yesterday and try to reach out to their provider for possible treatment plan.  Had not yet heard back by this morning and husband noted patient had a fever greater than 101 Fahrenheit with increased congestion and decreased oxygen saturations and 911 was called.  On arrival EMS noted saturations in the 88 to 92% range with rhonchi and warm skin to the touch.  She received albuterol, Atrovent, magnesium, Solu-Medrol and route with some improvement and has had weaning oxygen demand.  Unable to obtain full accurate review of systems due to patient's baseline dementia.  ED Course: Vital signs in the ED significant for blood pressure in the [id number] to [id number] systolic.  Initially requiring 8 L and wean down to 2 L now intermittently on room air.  Lab workup included CMP with sodium 133, chloride 96, calcium 8.4, protein 4.9, albumin 1.9.  CBC with hemoglobin stable at 11.3.  PT, PTT, INR normal.  Lactic acid normal x 2.  Troponin normal x 2.  BNP normal.  Respiratory panel for flu COVID RSV negative.  D-dimer elevated to 1.94.  Urinalysis with leukocytes and few bacteria only.  Urine culture and blood culture pending.  Chest x-ray showed improving aeration of the lung bases with probable pleural effusion but no evidence of pneumonia.  CT PE study showed chronic right greater than left dependent opacities representing atelectasis versus pneumonia on right with mild mosaic pattern which could indicate small  airways disease.  Patient received ceftriaxone, azithromycin as well as 3 L of IV fluids in the ED.  Review of Systems: Unable to obtain full accurate review of systems due to patient's baseline dementia.  Past Medical History:  Diagnosis Date   Altered mental status 01/18/2023   Arthritis lower back/hands   Atypical chest pain 04/15/2017   Atypical chest pain   Brain injury ([place])    Cancer ([place]) breast tumor removed   COVID-19 02/02/2023   Dementia ([place])    PSD-FTD   Disorientation 01/18/2023   Hip fracture ([place]) 09/06/2019   History of epilepsy 02/02/2023   Leg fracture    Seizure disorder ([place])    Seizures ([place])    Sepsis ([place]) 01/11/2023   VT (ventricular tachycardia) ([place]) 04/29/2017    Past Surgical History:  Procedure Laterality Date   BREAST SURGERY     FACIAL FRACTURE SURGERY  orbital bone repair   LEFT HEART CATH AND CORONARY ANGIOGRAPHY N/A 04/30/2017   Procedure: Left Heart Cath and Coronary Angiography;  Surgeon: Leonie Man, MD;  Location: [facility];  Service: Cardiovascular;  Laterality: N/A;   TOTAL HIP ARTHROPLASTY Left 09/07/2019   Procedure: TOTAL HIP ARTHROPLASTY ANTERIOR APPROACH;  Surgeon: Rod Can, MD;  Location: [facility];  Service: Orthopedics;  Laterality: Left;    Social History  reports that she has never smoked. She has never used smokeless tobacco. She reports that she does not drink alcohol and does not use drugs.  No Known Allergies  Family History  Problem Relation Age of Onset  Heart failure Mother    Heart disease Mother    Pancreatic cancer Father    Cancer Brother   Reviewed on admission  Prior to Admission medications   Medication Sig Start Date End Date Taking? Authorizing Provider  acetaminophen (TYLENOL) 325 MG tablet Take 2 tablets (650 mg total) by mouth every 6 (six) hours as needed for mild pain, fever or headache. 01/22/23   Elgergawy, Silver Huguenin, MD  acetaminophen (TYLENOL) 500 MG tablet Take 500 mg by mouth  every 6 (six) hours as needed for moderate pain or mild pain.    [provider]  CALCIUM PO Take 1 capsule by mouth daily.    [provider]  cloBAZam (ONFI) 10 MG tablet Take 1 tablet (10 mg) in the mornings and one and a half tablets (15 mg) at bedtime by moth daily.    [provider]  divalproex (DEPAKOTE ER) 500 MG 24 hr tablet Take 1,000 mg by mouth at bedtime.     [provider]  lansoprazole (PREVACID) 30 MG capsule Take 30 mg by mouth 2 (two) times daily. 06/10/22   [provider]  levothyroxine (SYNTHROID) 25 MCG tablet Take 2 tablets (50 mcg total) by mouth daily at 6 (six) AM. 02/06/23   Danford, Suann Larry, MD  Melatonin 10 MG CAPS Take 1 capsule by mouth at bedtime.    [provider]  polyethylene glycol (MIRALAX / GLYCOLAX) packet Take 17 g by mouth daily.    [provider]  propafenone (RYTHMOL SR) 225 MG 12 hr capsule TAKE 1 CAPSULE BY MOUTH TWICE A DAY Patient taking differently: Take 225 mg by mouth 2 (two) times daily. 05/04/22   Lelon Perla, MD  trimethoprim (TRIMPEX) 100 MG tablet Take 100 mg by mouth daily. 11/30/22   [provider]  VITAMIN D PO Take 1 capsule by mouth daily.    [provider]    Physical Exam: Vitals:   03/12/23 0803 03/12/23 1100 03/12/23 1200 03/12/23 1230  BP:  108/84 130/87 109/67  Pulse:  84 86 86  Resp:  16 13 19   Temp: 98.7 F (37.1 C) 98.1 F (36.7 C)    TempSrc: Rectal     SpO2:  95% 97% 99%    Physical Exam Constitutional:      General: She is not in acute distress.    Appearance: Normal appearance.     Comments: Pleasantly demented  HENT:     Head: Normocephalic and atraumatic.     Mouth/Throat:     Mouth: Mucous membranes are moist.     Pharynx: Oropharynx is clear.  Eyes:     Extraocular Movements: Extraocular movements intact.     Pupils: Pupils are equal, round, and reactive to light.  Cardiovascular:     Rate and Rhythm: Normal  rate and regular rhythm.     Pulses: Normal pulses.     Heart sounds: Normal heart sounds.  Pulmonary:     Effort: Pulmonary effort is normal. No respiratory distress.     Comments: Distant breath sounds Abdominal:     General: Bowel sounds are normal. There is no distension.     Palpations: Abdomen is soft.     Tenderness: There is no abdominal tenderness.  Musculoskeletal:        General: No swelling or deformity.     Right lower leg: Edema present.     Left lower leg: Edema present.  Skin:    General: Skin is warm  and dry.  Neurological:     General: No focal deficit present.     Mental Status: Mental status is at baseline.     Comments: Pleasantly demented, tired    Labs on Admission: I have personally reviewed following labs and imaging studies  CBC: Recent Labs  Lab 03/12/23 0720  WBC 6.1  NEUTROABS 3.2  HGB 11.3*  HCT 34.2*  MCV 99.7  PLT 123XX123    Basic Metabolic Panel: Recent Labs  Lab 03/12/23 0720  NA 133*  K 4.0  CL 96*  CO2 26  GLUCOSE 96  BUN 12  CREATININE 0.58  CALCIUM 8.4*    GFR: CrCl cannot be calculated (Unknown ideal weight.).  Liver Function Tests: Recent Labs  Lab 03/12/23 0720  AST 17  ALT 10  ALKPHOS 54  BILITOT 0.5  PROT 4.9*  ALBUMIN 1.9*    Urine analysis:    Component Value Date/Time   COLORURINE YELLOW 03/12/2023 0714   APPEARANCEUR CLOUDY (A) 03/12/2023 0714   LABSPEC 1.006 03/12/2023 0714   PHURINE 6.0 03/12/2023 0714   GLUCOSEU NEGATIVE 03/12/2023 0714   HGBUR NEGATIVE 03/12/2023 0714   BILIRUBINUR NEGATIVE 03/12/2023 0714   KETONESUR NEGATIVE 03/12/2023 0714   PROTEINUR NEGATIVE 03/12/2023 0714   UROBILINOGEN 0.2 08/26/2014 1546   NITRITE NEGATIVE 03/12/2023 0714   LEUKOCYTESUR LARGE (A) 03/12/2023 0714    Radiological Exams on Admission: CT Angio Chest PE W/Cm &/Or Wo Cm  Result Date: 03/12/2023 CLINICAL DATA:  Pulmonary embolism suspected, high probability. EXAM: CT ANGIOGRAPHY CHEST WITH CONTRAST  TECHNIQUE: Multidetector CT imaging of the chest was performed using the standard protocol during bolus administration of intravenous contrast. Multiplanar CT image reconstructions and MIPs were obtained to evaluate the vascular anatomy. RADIATION DOSE REDUCTION: This exam was performed according to the departmental dose-optimization program which includes automated exposure control, adjustment of the mA and/or kV according to patient size and/or use of iterative reconstruction technique. CONTRAST:  49mL OMNIPAQUE IOHEXOL 350 MG/ML SOLN COMPARISON:  Radiographs 03/12/2023. Chest CTA 02/02/2023 and 01/17/2023. FINDINGS: Cardiovascular: The pulmonary arteries are well opacified with contrast to the level of the subsegmental branches. There is no evidence of acute pulmonary embolism. Mild aortic and great vessel atherosclerosis without acute systemic arterial abnormalities. The heart size is normal. There is no pericardial effusion. Mediastinum/Nodes: There are no enlarged mediastinal, hilar or axillary lymph nodes. The thyroid gland, trachea and esophagus demonstrate no significant findings. Lungs/Pleura: Trace pleural effusion on the right. No pneumothorax. There are chronic dependent airspace opacities at both lung bases. In the right lower lobe, this has worsened from the most recent prior study with associated consolidation and volume loss. The left basilar components have not significantly changed. Mild mosaic attenuation throughout the aerated lungs. No suspicious pulmonary nodules. Upper abdomen: The visualized upper abdomen appears stable, without significant findings. Musculoskeletal/Chest wall: There is no chest wall mass or suspicious osseous finding. Numerous mild thoracic compression deformities are unchanged. Review of the MIP images confirms the above findings. IMPRESSION: 1. No evidence of acute pulmonary embolism or other acute vascular findings in the chest. 2. Chronic dependent airspace opacities at  both lung bases, right greater than left. The right lower lobe components have worsened from the most recent prior study and could reflect atelectasis or pneumonia. Consider chronic aspiration. 3. Mild mosaic attenuation throughout the aerated lungs, nonspecific, but can be seen with small airways disease. 4.  Aortic Atherosclerosis (ICD10-I70.0). Electronically Signed   By: Richardean Sale M.D.   On:  03/12/2023 11:49   DG Chest Port 1 View  Result Date: 03/12/2023 CLINICAL DATA:  Shortness of breath.  Questionable sepsis. EXAM: PORTABLE CHEST 1 VIEW COMPARISON:  Radiographs 02/02/2023 and 01/19/2023.  CT 02/02/2023. FINDINGS: 0728 hours. The heart size and mediastinal contours are stable. Interval improvement in previously demonstrated streaky bibasilar opacities, likely reflecting scarring or residual atelectasis. There is no edema, confluent airspace opacity or pneumothorax. Possible small bilateral pleural effusions. The bones appear unremarkable. Telemetry leads overlie the chest. IMPRESSION: Interval improved aeration of the lung bases. Possible small bilateral pleural effusions. No evidence of pneumonia. Electronically Signed   By: Richardean Sale M.D.   On: 03/12/2023 07:37    EKG: Independently reviewed.  Interpretation assumes right left electrode reversal.  Sinus rhythm nonspecific intraventricular conduction delay with QRS 121 most consistent with early right bundle branch block.  Nonspecific T wave flattening.  Assessment/Plan Principal Problem:   Acute respiratory failure with hypoxia (HCC) Active Problems:   History of VT (ventricular tachycardia) (HCC)   Seizure disorder (HCC)   Dementia without behavioral disturbance (HCC)   Hypothyroidism   Abnormal urinalysis   Acute respiratory failure with hypoxia > As per HPI has had a couple days of cough with fever and desaturations today. > 88% per EMS with improvement on supplemental oxygen weaned down to room air at some point in the  ED, back on 2 L intermittently due to desaturations when falling asleep. > Did receive albuterol, Atrovent, magnesium, Solu-Medrol and route.  Received IV fluids, ceftriaxone, azithromycin in the ED. > Chest x-ray showed no acute normality with improvement of chronic changes at the lung bases.  CT PE study showed above chronic dependent opacities right greater than left that could represent atelectasis versus pneumonia on the right due to some worsening on the right as well as a mild mosaic pattern could be consistent with small airways disease. - Monitor on telemetry - Continue supplemental oxygen as  needed and wean as tolerated - Continue with ceftriaxone as below for now - Negative for flu COVID RSV, will check full respiratory viral panel to evaluate for viral etiology - Procalcitonin to further delineate between bacterial versus viral etiology - Trend fever curve and WBC - Hold off on further IV fluids - As needed albuterol - Supportive care  Abnormal urinalysis Recurrent UTI > UA did show leukocytes and few bacteria.  No reported symptoms, but difficult to get information on symptoms accurately from her per husband. > Has been also reiterates that she has history of chronic UTIs and is on suppressive antibiotics. - Will continue with ceftriaxone for now and discontinue pending culture results - Follow-up urine cultures - Hold suppressive trimethoprim for now  Seizure disorder - Continue home Onfi (ordered as 10 mg twice daily but prescribed for 10 mg a.m. and 15 mg at night but unable to wear this in our system) and Depakote  Hypothyroidism - Continue on Synthroid  History of ventricular tachycardia - Continue home propafenone  Dementia - Noted, supportive care - Dysphagia diet, thickened liquids  DVT prophylaxis: Lovenox Code Status:   Full, MOST form reviewed at bedside, advance directive noted in chart Family Communication:  Updated husband by phone Disposition Plan:    Patient is from:  Home  Anticipated DC to:  Home  Anticipated DC date:  1 to 3 days  Anticipated DC barriers: None  Consults called:  None Admission status:  Observation, telemetry  Severity of Illness: The appropriate patient status for this patient is  OBSERVATION. Observation status is judged to be reasonable and necessary in order to provide the required intensity of service to ensure the patient's safety. The patient's presenting symptoms, physical exam findings, and initial radiographic and laboratory data in the context of their medical condition is felt to place them at decreased risk for further clinical deterioration. Furthermore, it is anticipated that the patient will be medically stable for discharge from the hospital within 2 midnights of admission.    Marcelyn Bruins MD [facility]  How to contact the [facility] Attending or Consulting provider [place] or covering provider during after hours [place], for this patient?   Check the care team in [facility] and look for a) attending/consulting [facility] provider listed and b) the [facility] team listed Log into [email or web address] and use [facility]'s universal password to access. If you do not have the password, please contact the hospital operator. Locate the [facility] provider you are looking for under [facility] and page to a number that you can be directly reached. If you still have difficulty reaching the provider, please page the [facility] (Director on Call) for the Hospitalists listed on amion for assistance.  03/12/2023, 1:09 PM

## 2023-03-13 DIAGNOSIS — Z7401 Bed confinement status: Secondary | ICD-10-CM | POA: Diagnosis not present

## 2023-03-13 DIAGNOSIS — J9600 Acute respiratory failure, unspecified whether with hypoxia or hypercapnia: Secondary | ICD-10-CM | POA: Diagnosis not present

## 2023-03-13 DIAGNOSIS — J9601 Acute respiratory failure with hypoxia: Secondary | ICD-10-CM | POA: Diagnosis not present

## 2023-03-13 LAB — BLOOD CULTURE ID PANEL (REFLEXED) - BCID2

## 2023-03-13 LAB — COMPREHENSIVE METABOLIC PANEL
ALT: 9 U/L (ref 0–44)
AST: 17 U/L (ref 15–41)
Albumin: 1.9 g/dL — ABNORMAL LOW (ref 3.5–5.0)
Alkaline Phosphatase: 55 U/L (ref 38–126)
Anion gap: 11 (ref 5–15)
BUN: 15 mg/dL (ref 8–23)
CO2: 30 mmol/L (ref 22–32)
Calcium: 8.7 mg/dL — ABNORMAL LOW (ref 8.9–10.3)
Chloride: 96 mmol/L — ABNORMAL LOW (ref 98–111)
Creatinine, Ser: 0.6 mg/dL (ref 0.44–1.00)
GFR, Estimated: >60 mL/min (ref >60)
Glucose, Bld: 173 mg/dL — ABNORMAL HIGH (ref 70–99)
Potassium: 4.1 mmol/L (ref 3.5–5.1)
Sodium: 137 mmol/L (ref 135–145)
Total Bilirubin: 0.6 mg/dL (ref 0.3–1.2)
Total Protein: 4.9 g/dL — ABNORMAL LOW (ref 6.5–8.1)

## 2023-03-13 LAB — PROCALCITONIN
Procalcitonin: <0.05 ng/mL
Procalcitonin: <0.1 ng/mL

## 2023-03-13 LAB — CBC
HCT: 33.6 % — ABNORMAL LOW (ref 36.0–46.0)
Hemoglobin: 11.5 g/dL — ABNORMAL LOW (ref 12.0–15.0)
MCH: 33.1 pg (ref 26.0–34.0)
MCHC: 34.2 g/dL (ref 30.0–36.0)
MCV: 96.8 fL (ref 80.0–100.0)
Platelets: 173 10*3/uL (ref 150–400)
RBC: 3.47 MIL/uL — ABNORMAL LOW (ref 3.87–5.11)
RDW: 14.7 % (ref 11.5–15.5)
WBC: 6 10*3/uL (ref 4.0–10.5)
nRBC: 0 % (ref 0.0–0.2)

## 2023-03-13 MED ORDER — ACETAMINOPHEN 325 MG PO TABS: 650.0000 mg | ORAL_TABLET | Freq: Four times a day (QID) | ORAL | Status: DC | PRN

## 2023-03-13 MED ORDER — AMOXICILLIN 500 MG PO CAPS: 500.0000 mg | ORAL_CAPSULE | Freq: Three times a day (TID) | ORAL | 0 refills | Status: AC

## 2023-03-13 MED ORDER — AMOXICILLIN 500 MG PO CAPS
500.0000 mg | ORAL_CAPSULE | Freq: Three times a day (TID) | ORAL | Status: DC
  Administered 2023-03-13 (×2): 500 mg via ORAL
  Filled 2023-03-13 (×3): qty 1

## 2023-03-13 MED ORDER — TRIMETHOPRIM 100 MG PO TABS: 100.0000 mg | ORAL_TABLET | Freq: Every day | ORAL | Status: DC

## 2023-03-13 NOTE — Discharge Summary (Signed)
DISCHARGE SUMMARY  [name]  MR#: [id number]  DOB:09-15-1956  Date of Admission: 03/12/2023 Date of Discharge: 03/13/2023  Attending Physician:Jonique Kulig Hennie Duos, MD  Patient's [id number], Thurmond Butts, DO  Consults: none  Disposition: D/C home   Follow-up Appts:  Follow-up Information     Lurline Del, DO Follow up in 1 week(s).   Specialty: Family Medicine Contact information: [place] [address] [phone]                 Tests Needing Follow-up: -f/u on results of blood culture pending at time of d/c as discussed below  Discharge Diagnoses: Acute hypoxic respiratory failure - pneumonia ruled out Suspected aspiration pneumonitis POA - resolved  Abnormal UA POA Seizure disorder Hypothyroidism History of VT Dementia  Initial presentation: 67 year old with a history of seizure disorder, hypothyroidism, VTE, and dementia who was brought in by EMS after 2 days of cough which progressed to include a temperature to 101 and oxygen saturations in the upper 80s.  In the ER she continued to require 2 L of nasal cannula support to maintain her saturations.  She was negative for COVID RSV and influenza.  CXR noted no convincing evidence of a pulmonary infiltrate.  CTa chest noted left greater than right opacities consistent with atelectasis versus pneumonia.   Hospital Course:  Acute hypoxic respiratory failure - pneumonia ruled out - suspected aspiration pneumonitis  Procalcitonin not consistent with a pulmonary bacterial infection -clinical history suggestive of a probable episode of aspiration pneumonitis -clinically stable at time of discharge with no respiratory distress and normalized oxygen saturations -procalcitonin not consistent with an active bacterial infection along -no indication for ongoing antibiotic use for this issue   Abnormal UA POA UA at presentation did reveal leukocytes and few bacteria - history not helpful due to dementia  -culture revealed a modest colony count of Enterococcus -discussed with patient and husband -this may very well simply represent colonization -given patient's low-grade fever at presentation however I will treat her with a 5-day course of amoxicillin after which she will return to her suppressive antibiotic as previously prescribed by her Urologist  1 of 2 Blood Cx + Preliminary report reveals a staph species with no resistance suggesting this is likely a contaminant -discussed with patient and husband at length -clinically she does not appear to be suffering with a true bacteremia -I will follow-up on the final result, but do not feel that prolonging her hospitalization is indicated for this issue -she and her husband will be alerted via telephone if concerning results appear when the culture is final   Seizure disorder Continue usual home medications  Hypothyroidism Continue usual Synthroid dose   History of VT Continue propafenone without change   Dementia No change in usual home treatment regimen -the patient appears to be receiving exceptional care at home per family  Allergies as of 03/13/2023   No Known Allergies      Medication List     TAKE these medications    acetaminophen 325 MG tablet Commonly known as: TYLENOL Take 2 tablets (650 mg total) by mouth every 6 (six) hours as needed for mild pain, fever or headache. What changed:  medication strength how much to take when to take this reasons to take this   amoxicillin 500 MG capsule Commonly known as: AMOXIL Take 1 capsule (500 mg total) by mouth every 8 (eight) hours for 5 days.   CALCIUM + VITAMIN D3 PO Take 1 tablet by mouth  daily.   CHILDRENS MULTI VITAMINS/IRON PO Take 1 tablet by mouth daily.   cloBAZam 10 MG tablet Commonly known as: ONFI Take 10-15 mg by mouth See admin instructions. 10 mg every morning, 15 mg every evening   divalproex 500 MG 24 hr tablet Commonly known as: DEPAKOTE ER Take 1,000  mg by mouth at bedtime.   lansoprazole 30 MG capsule Commonly known as: PREVACID Take 30 mg by mouth 2 (two) times daily.   levothyroxine 50 MCG tablet Commonly known as: SYNTHROID Take 50 mcg by mouth daily before breakfast. What changed: Another medication with the same name was removed. Continue taking this medication, and follow the directions you see here.   Multivitamin Women 50+ Tabs Take 1 tablet by mouth daily.   polyethylene glycol 17 g packet Commonly known as: MIRALAX / GLYCOLAX Take 8.5 g by mouth daily.   propafenone 225 MG 12 hr capsule Commonly known as: RYTHMOL SR TAKE 1 CAPSULE BY MOUTH TWICE A DAY   trimethoprim 100 MG tablet Commonly known as: TRIMPEX Take 1 tablet (100 mg total) by mouth daily. Start taking on: March 18, 2023 What changed: These instructions start on March 18, 2023. If you are unsure what to do until then, ask your doctor or other care provider.   VITAMIN D-3 PO Take 1 capsule by mouth daily.        Day of Discharge BP 101/60 (BP Location: Right Arm)   Pulse 80   Temp 97.8 F (36.6 C) (Oral)   Resp 19   Ht 5\' 6"  (1.676 m)   Wt 83.7 kg   SpO2 99%   BMI 29.78 kg/m   Physical Exam: General: No acute respiratory distress Lungs: Clear to auscultation bilaterally without wheezes or crackles Cardiovascular: Regular rate and rhythm without murmur gallop or rub normal S1 and S2 Abdomen: Nontender, nondistended, soft, bowel sounds positive, no rebound, no ascites, no appreciable mass Extremities: No significant cyanosis, clubbing, or edema bilateral lower extremities  Basic Metabolic Panel: Recent Labs  Lab 03/12/23 0720 03/13/23 0048  NA 133* 137  K 4.0 4.1  CL 96* 96*  CO2 26 30  GLUCOSE 96 173*  BUN 12 15  CREATININE 0.58 0.60  CALCIUM 8.4* 8.7*   CBC: Recent Labs  Lab 03/12/23 0720 03/13/23 0048  WBC 6.1 6.0  NEUTROABS 3.2  --   HGB 11.3* 11.5*  HCT 34.2* 33.6*  MCV 99.7 96.8  PLT 165 173    Time spent in  discharge (includes decision making & examination of pt): 30 minutes  03/13/2023, 2:34 PM   Cherene Altes, MD [facility] Office  [phone]

## 2023-03-13 NOTE — Plan of Care (Signed)
  Problem: Clinical Measurements: Goal: Ability to maintain clinical measurements within normal limits will improve Outcome: Progressing Goal: Will remain free from infection Outcome: Progressing Goal: Diagnostic test results will improve Outcome: Progressing Goal: Respiratory complications will improve Outcome: Progressing Goal: Cardiovascular complication will be avoided Outcome: Progressing   Problem: Clinical Measurements: Goal: Will remain free from infection Outcome: Progressing   

## 2023-03-13 NOTE — Progress Notes (Signed)
Relayed to case management [place], pt in need of [facility] transport due to being bed bound.

## 2023-03-13 NOTE — Progress Notes (Signed)
[facility] here to pick up patient. Patient was cleaned up, IV already removed and discharge papers already given to husband. New foams applied to buttocks and heels. Vitals WNL with [facility] and pt voiced she is ready to go. Husband also given night time antibiotic due to outside pharmacy already being closed and needing to have PM dose. Husband was very Patent attorney. All questions answered.   Foster Simpson Jasiel Belisle

## 2023-03-13 NOTE — Progress Notes (Signed)
Husband verbalized understanding of dc instructions. All belongings givn to patient and husband.

## 2023-03-13 NOTE — TOC Transition Note (Signed)
Transition of Care [facility]) - CM/SW Discharge Note   Patient Details  Name: [name] MRN: [id number] Date of Birth: 05/02/1956  [facility]) CM/SW Contact:  Maebelle Munroe, RN Phone Number: 03/13/2023, 5:15 PM   Clinical Narrative:  TOC team arranged for transport to home via [place]. Spoke with husband- [name] of transportation arrangements. He verbalized understanding and agreed with plan. No further needs identified. Call to [facility] to schedule transport with O2.     Final next level of care: Home/Self Care Barriers to Discharge: No Barriers Identified   Patient Goals and CMS Choice      Discharge Placement                         Discharge Plan and Services Additional resources added to the After Visit Summary for                  DME Arranged: N/A DME Agency: NA         HH Agency: NA        Social Determinants of Health ([place]) Interventions SDOH Screenings   Food Insecurity: No Food Insecurity (01/12/2023)  Housing: Low Risk  (01/12/2023)  Transportation Needs: No Transportation Needs (01/12/2023)  Utilities: Not At Risk (01/12/2023)  Tobacco Use: Low Risk  (03/12/2023)     Readmission Risk Interventions    02/03/2023    2:39 PM 08/24/2022   11:18 AM  Readmission Risk Prevention Plan  Post Dischage Appt  Complete  Medication Screening  Complete  Transportation Screening Complete Complete  PCP or Specialist Appt within 3-5 Days Complete   HRI or [place] Complete   Social Work Consult for [place] Planning/Counseling Complete   Palliative Care Screening Complete   Medication Review Press photographer) Complete

## 2023-03-13 NOTE — Progress Notes (Addendum)
PHARMACY - PHYSICIAN COMMUNICATION CRITICAL VALUE ALERT - BLOOD CULTURE IDENTIFICATION (BCID)  [name] is an 67 y.o. female who presented to [facility] on 03/12/2023 with a chief complaint of shortness of breath  Assessment:  1/4 Bcx with staph species in anaerobic bottle, no resistance  Name of physician (or Provider) Contacted: [place]  Current antibiotics: amoxicillin 500mg  PO q8h   Changes to prescribed antibiotics recommended:  None - likely contaminant.   Results for orders placed or performed during the hospital encounter of 03/12/23  Blood Culture ID Panel (Reflexed) (Collected: 03/12/2023  7:19 AM)  Result Value Ref Range   Enterococcus faecalis NOT DETECTED NOT DETECTED   Enterococcus Faecium NOT DETECTED NOT DETECTED   Listeria monocytogenes NOT DETECTED NOT DETECTED   Staphylococcus species DETECTED (A) NOT DETECTED   Staphylococcus aureus (BCID) NOT DETECTED NOT DETECTED   Staphylococcus epidermidis NOT DETECTED NOT DETECTED   Staphylococcus lugdunensis NOT DETECTED NOT DETECTED   Streptococcus species NOT DETECTED NOT DETECTED   Streptococcus agalactiae NOT DETECTED NOT DETECTED   Streptococcus pneumoniae NOT DETECTED NOT DETECTED   Streptococcus pyogenes NOT DETECTED NOT DETECTED   A.calcoaceticus-baumannii NOT DETECTED NOT DETECTED   Bacteroides fragilis NOT DETECTED NOT DETECTED   Enterobacterales NOT DETECTED NOT DETECTED   Enterobacter cloacae complex NOT DETECTED NOT DETECTED   Escherichia coli NOT DETECTED NOT DETECTED   Klebsiella aerogenes NOT DETECTED NOT DETECTED   Klebsiella oxytoca NOT DETECTED NOT DETECTED   Klebsiella pneumoniae NOT DETECTED NOT DETECTED   Proteus species NOT DETECTED NOT DETECTED   Salmonella species NOT DETECTED NOT DETECTED   Serratia marcescens NOT DETECTED NOT DETECTED   Haemophilus influenzae NOT DETECTED NOT DETECTED   Neisseria meningitidis NOT DETECTED NOT DETECTED   Pseudomonas aeruginosa NOT DETECTED NOT DETECTED    Stenotrophomonas maltophilia NOT DETECTED NOT DETECTED   Candida albicans NOT DETECTED NOT DETECTED   Candida auris NOT DETECTED NOT DETECTED   Candida glabrata NOT DETECTED NOT DETECTED   Candida krusei NOT DETECTED NOT DETECTED   Candida parapsilosis NOT DETECTED NOT DETECTED   Candida tropicalis NOT DETECTED NOT DETECTED   Cryptococcus neoformans/gattii NOT DETECTED NOT DETECTED    Wilson Singer, PharmD Clinical Pharmacist 03/13/2023 2:31 PM

## 2023-03-14 LAB — URINE CULTURE: Culture: 70000 — AB

## 2023-03-15 DIAGNOSIS — M19041 Primary osteoarthritis, right hand: Secondary | ICD-10-CM | POA: Diagnosis not present

## 2023-03-15 DIAGNOSIS — F0280 Dementia in other diseases classified elsewhere without behavioral disturbance: Secondary | ICD-10-CM | POA: Diagnosis not present

## 2023-03-15 DIAGNOSIS — M4802 Spinal stenosis, cervical region: Secondary | ICD-10-CM | POA: Diagnosis not present

## 2023-03-15 DIAGNOSIS — G40209 Localization-related (focal) (partial) symptomatic epilepsy and epileptic syndromes with complex partial seizures, not intractable, without status epilepticus: Secondary | ICD-10-CM | POA: Diagnosis not present

## 2023-03-15 DIAGNOSIS — L89151 Pressure ulcer of sacral region, stage 1: Secondary | ICD-10-CM | POA: Diagnosis not present

## 2023-03-15 DIAGNOSIS — M19042 Primary osteoarthritis, left hand: Secondary | ICD-10-CM | POA: Diagnosis not present

## 2023-03-15 DIAGNOSIS — G3109 Other frontotemporal dementia: Secondary | ICD-10-CM | POA: Diagnosis not present

## 2023-03-15 DIAGNOSIS — I4729 Other ventricular tachycardia: Secondary | ICD-10-CM | POA: Diagnosis not present

## 2023-03-15 DIAGNOSIS — M47819 Spondylosis without myelopathy or radiculopathy, site unspecified: Secondary | ICD-10-CM | POA: Diagnosis not present

## 2023-03-16 DIAGNOSIS — F0280 Dementia in other diseases classified elsewhere without behavioral disturbance: Secondary | ICD-10-CM | POA: Diagnosis not present

## 2023-03-16 DIAGNOSIS — I4729 Other ventricular tachycardia: Secondary | ICD-10-CM | POA: Diagnosis not present

## 2023-03-16 DIAGNOSIS — G40209 Localization-related (focal) (partial) symptomatic epilepsy and epileptic syndromes with complex partial seizures, not intractable, without status epilepticus: Secondary | ICD-10-CM | POA: Diagnosis not present

## 2023-03-16 DIAGNOSIS — M47819 Spondylosis without myelopathy or radiculopathy, site unspecified: Secondary | ICD-10-CM | POA: Diagnosis not present

## 2023-03-16 DIAGNOSIS — M19041 Primary osteoarthritis, right hand: Secondary | ICD-10-CM | POA: Diagnosis not present

## 2023-03-16 DIAGNOSIS — M4802 Spinal stenosis, cervical region: Secondary | ICD-10-CM | POA: Diagnosis not present

## 2023-03-16 DIAGNOSIS — L89151 Pressure ulcer of sacral region, stage 1: Secondary | ICD-10-CM | POA: Diagnosis not present

## 2023-03-16 DIAGNOSIS — G3109 Other frontotemporal dementia: Secondary | ICD-10-CM | POA: Diagnosis not present

## 2023-03-16 DIAGNOSIS — M19042 Primary osteoarthritis, left hand: Secondary | ICD-10-CM | POA: Diagnosis not present

## 2023-03-16 NOTE — Progress Notes (Deleted)
{  Select_TRH_Note:[id number]} 

## 2023-03-17 DIAGNOSIS — M4802 Spinal stenosis, cervical region: Secondary | ICD-10-CM | POA: Diagnosis not present

## 2023-03-17 DIAGNOSIS — G40209 Localization-related (focal) (partial) symptomatic epilepsy and epileptic syndromes with complex partial seizures, not intractable, without status epilepticus: Secondary | ICD-10-CM | POA: Diagnosis not present

## 2023-03-17 DIAGNOSIS — G3109 Other frontotemporal dementia: Secondary | ICD-10-CM | POA: Diagnosis not present

## 2023-03-17 DIAGNOSIS — M19042 Primary osteoarthritis, left hand: Secondary | ICD-10-CM | POA: Diagnosis not present

## 2023-03-17 DIAGNOSIS — L89151 Pressure ulcer of sacral region, stage 1: Secondary | ICD-10-CM | POA: Diagnosis not present

## 2023-03-17 DIAGNOSIS — M19041 Primary osteoarthritis, right hand: Secondary | ICD-10-CM | POA: Diagnosis not present

## 2023-03-17 DIAGNOSIS — F0280 Dementia in other diseases classified elsewhere without behavioral disturbance: Secondary | ICD-10-CM | POA: Diagnosis not present

## 2023-03-17 DIAGNOSIS — I4729 Other ventricular tachycardia: Secondary | ICD-10-CM | POA: Diagnosis not present

## 2023-03-17 DIAGNOSIS — M47819 Spondylosis without myelopathy or radiculopathy, site unspecified: Secondary | ICD-10-CM | POA: Diagnosis not present

## 2023-03-17 LAB — CULTURE, BLOOD (ROUTINE X 2): Culture: NO GROWTH

## 2023-03-18 DIAGNOSIS — M19042 Primary osteoarthritis, left hand: Secondary | ICD-10-CM | POA: Diagnosis not present

## 2023-03-18 DIAGNOSIS — M19041 Primary osteoarthritis, right hand: Secondary | ICD-10-CM | POA: Diagnosis not present

## 2023-03-18 DIAGNOSIS — M4802 Spinal stenosis, cervical region: Secondary | ICD-10-CM | POA: Diagnosis not present

## 2023-03-18 DIAGNOSIS — M47819 Spondylosis without myelopathy or radiculopathy, site unspecified: Secondary | ICD-10-CM | POA: Diagnosis not present

## 2023-03-18 DIAGNOSIS — L89151 Pressure ulcer of sacral region, stage 1: Secondary | ICD-10-CM | POA: Diagnosis not present

## 2023-03-18 DIAGNOSIS — G40209 Localization-related (focal) (partial) symptomatic epilepsy and epileptic syndromes with complex partial seizures, not intractable, without status epilepticus: Secondary | ICD-10-CM | POA: Diagnosis not present

## 2023-03-18 DIAGNOSIS — I4729 Other ventricular tachycardia: Secondary | ICD-10-CM | POA: Diagnosis not present

## 2023-03-18 DIAGNOSIS — F0280 Dementia in other diseases classified elsewhere without behavioral disturbance: Secondary | ICD-10-CM | POA: Diagnosis not present

## 2023-03-18 DIAGNOSIS — G3109 Other frontotemporal dementia: Secondary | ICD-10-CM | POA: Diagnosis not present

## 2023-03-20 LAB — CULTURE, BLOOD (ROUTINE X 2): Special Requests: ADEQUATE

## 2023-03-22 ENCOUNTER — Ambulatory Visit: Payer: Medicare PPO | Attending: Cardiology | Admitting: Cardiology

## 2023-03-22 ENCOUNTER — Encounter: Payer: Self-pay | Admitting: Cardiology

## 2023-03-22 VITALS — BP 116/74 | HR 70 | Ht 66.0 in | Wt 190.0 lb

## 2023-03-22 DIAGNOSIS — R55 Syncope and collapse: Secondary | ICD-10-CM

## 2023-03-22 DIAGNOSIS — I472 Ventricular tachycardia, unspecified: Secondary | ICD-10-CM | POA: Diagnosis not present

## 2023-03-22 NOTE — Progress Notes (Signed)
Electrophysiology Office Note   Date:  03/22/2023   ID:  [name], [name] 11/13/1956, MRN [id number]  PCP:  Emily Del, DO  Cardiologist:  [name] Primary Electrophysiologist:  Emily Nickson Meredith Leeds, MD    No chief complaint on file.     History of Present Illness: [name] is a 67 y.o. female who is being seen today for the evaluation of VT at the request of [name], Emily Salinas, MD. Presenting today for electrophysiology evaluation.   Was in the hospital 04/30/2017 after an exercise treadmill test showed ventricular tachycardia.  She has a history of seizures, bigeminy, PVCs.  She gets occasional palpitations between 5 and 10 minutes.  States 3 of her treadmill test to develop sustained VT.  Treadmill test was stopped and she converted to sinus rhythm.  She had significant shortness of breath.  Cardiac catheterization MRI showed no evidence of coronary artery disease.  She was started on propafenone.  Today, denies symptoms of palpitations, chest pain, shortness of breath, orthopnea, PND, lower extremity edema, claudication, dizziness, presyncope, syncope, bleeding, or neurologic sequela. The patient is tolerating medications without difficulties.  Currently she is doing well.  She did have a prolonged hospitalization January February 2024 due to COVID, failure to thrive, frontotemporal dementia.  She is in a wheelchair today.    Past Medical History:  Diagnosis Date   Altered mental status 01/18/2023   Arthritis lower back/hands   Atypical chest pain 04/15/2017   Atypical chest pain   Brain injury ([place])    Cancer ([place]) breast tumor removed   COVID-19 02/02/2023   Dementia ([place])    PSD-FTD   Disorientation 01/18/2023   Hip fracture ([place]) 09/06/2019   History of epilepsy 02/02/2023   Leg fracture    Seizure disorder ([place])    Seizures ([place])    Sepsis ([place]) 01/11/2023   VT (ventricular tachycardia) ([place]) 04/29/2017   Past Surgical History:  Procedure Laterality  Date   BREAST SURGERY     FACIAL FRACTURE SURGERY  orbital bone repair   LEFT HEART CATH AND CORONARY ANGIOGRAPHY N/A 04/30/2017   Procedure: Left Heart Cath and Coronary Angiography;  Surgeon: Emily Man, MD;  Location: [facility];  Service: Cardiovascular;  Laterality: N/A;   TOTAL HIP ARTHROPLASTY Left 09/07/2019   Procedure: TOTAL HIP ARTHROPLASTY ANTERIOR APPROACH;  Surgeon: Emily Can, MD;  Location: [facility];  Service: Orthopedics;  Laterality: Left;     Current Outpatient Medications  Medication Sig Dispense Refill   acetaminophen (TYLENOL) 325 MG tablet Take 2 tablets (650 mg total) by mouth every 6 (six) hours as needed for mild pain, fever or headache.     Calcium Carb-Cholecalciferol (CALCIUM + VITAMIN D3 PO) Take 1 tablet by mouth daily.     Cholecalciferol (VITAMIN D-3 PO) Take 1 capsule by mouth daily.     cloBAZam (ONFI) 10 MG tablet Take 10-15 mg by mouth See admin instructions. 10 mg every morning, 15 mg every evening     divalproex (DEPAKOTE ER) 500 MG 24 hr tablet Take 1,000 mg by mouth at bedtime.      lansoprazole (PREVACID) 30 MG capsule Take 30 mg by mouth 2 (two) times daily.     levothyroxine (SYNTHROID) 50 MCG tablet Take 50 mcg by mouth daily before breakfast.     Multiple Vitamins-Minerals (MULTIVITAMIN WOMEN 50+) TABS Take 1 tablet by mouth daily.     Pediatric Multivitamins-Iron (CHILDRENS MULTI VITAMINS/IRON PO) Take 1 tablet by mouth daily.  polyethylene glycol (MIRALAX / GLYCOLAX) packet Take 8.5 g by mouth daily.     propafenone (RYTHMOL SR) 225 MG 12 hr capsule TAKE 1 CAPSULE BY MOUTH TWICE A DAY (Patient taking differently: Take 225 mg by mouth 2 (two) times daily.) 180 capsule 3   trimethoprim (TRIMPEX) 100 MG tablet Take 1 tablet (100 mg total) by mouth daily.     No current facility-administered medications for this visit.    Allergies:   Patient has no known allergies.   Social History:  The patient  reports that she has never  smoked. She has never used smokeless tobacco. She reports that she does not drink alcohol and does not use drugs.   Family History:  The patient's family history includes Cancer in her brother; Heart disease in her mother; Heart failure in her mother; Pancreatic cancer in her father.   ROS:  Please see the history of present illness.   Otherwise, review of systems is positive for none.   All other systems are reviewed and negative.   PHYSICAL EXAM: VS:  BP 116/74   Pulse 70   Ht 5\' 6"  (1.676 m)   Wt 190 lb (86.2 kg)   SpO2 98%   BMI 30.67 kg/m  , BMI Body mass index is 30.67 kg/m. GEN: Well nourished, well developed, in no acute distress  HEENT: normal  Neck: no JVD, carotid bruits, or masses Cardiac: RRR; no murmurs, rubs, or gallops,no edema  Respiratory:  clear to auscultation bilaterally, normal work of breathing GI: soft, nontender, nondistended, + BS MS: no deformity or atrophy  Skin: warm and dry Neuro:  Strength and sensation are intact Psych: euthymic mood, full affect  EKG:  EKG is ordered today. Personal review of the ekg ordered  shows [name] rhythm, LVH  Recent Labs: 02/02/2023: TSH 8.545 02/03/2023: Magnesium 1.6 03/12/2023: B Natriuretic Peptide 30.8 03/13/2023: ALT 9; BUN 15; Creatinine, Ser 0.60; Hemoglobin 11.5; Platelets 173; Potassium 4.1; Sodium 137    Lipid Panel     Component Value Date/Time   CHOL 188 04/30/2017 0338   TRIG 24 04/30/2017 0338   HDL 106 04/30/2017 0338   CHOLHDL 1.8 04/30/2017 0338   VLDL 5 04/30/2017 0338   LDLCALC 77 04/30/2017 0338     Wt Readings from Last 3 Encounters:  03/22/23 190 lb (86.2 kg)  03/12/23 184 lb 8.4 oz (83.7 kg)  01/22/23 193 lb 5.5 oz (87.7 kg)      Other studies Reviewed: Additional studies/ records that were reviewed today include: TTE 05/02/17, Cath 04/30/17, ETT 05/11/17  Review of the above records today demonstrates:  - Left ventricle: The cavity size was normal. Systolic function was   normal. The  estimated ejection fraction was in the range of 60%   to 65%. Wall motion was normal; there were no regional wall   motion abnormalities. - Aortic valve: Trileaflet; mildly thickened, mildly calcified   leaflets. - Mitral valve: There was trivial regurgitation. - Right atrium: The atrium was mildly dilated. - Tricuspid valve: There was trivial regurgitation.   The left ventricular systolic function is normal. The left ventricular ejection fraction is greater than 65% by visual estimate. LV end diastolic pressure is normal. 11 mmHg Angiographically normal coronary arteries   Angiographic normal coronary arteries with normal LV function and EDP.  Blood pressure demonstrated a normal response to exercise. No T wave inversion was noted during stress. There was no ST segment deviation noted during stress. The patient requested the test to  be stopped. Heart rate demonstrated pharmacologically blunted response to exercise Overall, the patient's exercise capacity was normal. [facility] Treadmill Score: low risk   CMRI 05/03/17 1) Normal LV size and function EF 75%   2) Normal RV size and function no evidence of RV dysplasia   3) No delayed gadolinium enhancement in LV myocardium on inversion recovery sequences   4) Normal AV, MV and TV   5) Prominent epicardial fat pad  Cardiac monitor 08/28/2020 personally reviewed predomianant rhythm was sinus rhythm Zero atrial fibrillation noted <1% PVCs Symptoms of chest pain/pressure associated with sinus rhythm   ASSESSMENT AND PLAN:  1.  Ventricular tachycardia: Left heart catheterization and cardiac MRI without evidence of scar or coronary artery disease.  Currently on propafenone.  Low burden on most recent monitor.  No changes.  2.  Syncope: Has had multiple episodes where he falls asleep and is difficult to arouse.  Linq monitors been offered but the patient has refused.  Nothing on cardiac monitor.  No changes.   Current medicines are  reviewed at length with the patient today.   The patient does not have concerns regarding her medicines.  The following changes were made today: none  Labs/ tests ordered today include:  Orders Placed This Encounter  Procedures   EKG 12-Lead      Disposition:   FU 12 months  Signed, Jakylan Ron Meredith Leeds, MD  03/22/2023 3:15 PM     [address] [address] [phone] (office) [phone] (fax)

## 2023-03-22 NOTE — Patient Instructions (Signed)
Medication Instructions:  °Your physician recommends that you continue on your current medications as directed. Please refer to the Current Medication list given to you today. ° °*If you need a refill on your cardiac medications before your next appointment, please call your pharmacy* ° ° °Lab Work: °None ordered ° ° °Testing/Procedures: °None ordered ° ° °Follow-Up: °At [facility], you and your health needs are our priority.  As part of our continuing mission to provide you with exceptional heart care, we have created designated Provider Care Teams.  These Care Teams include your primary Cardiologist (physician) and Advanced Practice Providers (APPs -  Physician Assistants and Nurse Practitioners) who all work together to provide you with the care you need, when you need it. ° °Your next appointment:   °1 year(s) ° °The format for your next appointment:   °In Person ° °Provider:   °Will Camnitz, MD ° ° ° °Thank you for choosing [facility]!! ° ° °Pharell Rolfson, RN °[phone] ° ° ° °

## 2023-03-23 DIAGNOSIS — M4802 Spinal stenosis, cervical region: Secondary | ICD-10-CM | POA: Diagnosis not present

## 2023-03-23 DIAGNOSIS — N390 Urinary tract infection, site not specified: Secondary | ICD-10-CM | POA: Diagnosis not present

## 2023-03-23 DIAGNOSIS — L89151 Pressure ulcer of sacral region, stage 1: Secondary | ICD-10-CM | POA: Diagnosis not present

## 2023-03-23 DIAGNOSIS — G40209 Localization-related (focal) (partial) symptomatic epilepsy and epileptic syndromes with complex partial seizures, not intractable, without status epilepticus: Secondary | ICD-10-CM | POA: Diagnosis not present

## 2023-03-23 DIAGNOSIS — M47819 Spondylosis without myelopathy or radiculopathy, site unspecified: Secondary | ICD-10-CM | POA: Diagnosis not present

## 2023-03-23 DIAGNOSIS — J9601 Acute respiratory failure with hypoxia: Secondary | ICD-10-CM | POA: Diagnosis not present

## 2023-03-23 DIAGNOSIS — G3109 Other frontotemporal dementia: Secondary | ICD-10-CM | POA: Diagnosis not present

## 2023-03-23 DIAGNOSIS — A419 Sepsis, unspecified organism: Secondary | ICD-10-CM | POA: Diagnosis not present

## 2023-03-23 DIAGNOSIS — M19041 Primary osteoarthritis, right hand: Secondary | ICD-10-CM | POA: Diagnosis not present

## 2023-03-23 DIAGNOSIS — F0280 Dementia in other diseases classified elsewhere without behavioral disturbance: Secondary | ICD-10-CM | POA: Diagnosis not present

## 2023-03-23 DIAGNOSIS — M19042 Primary osteoarthritis, left hand: Secondary | ICD-10-CM | POA: Diagnosis not present

## 2023-03-23 DIAGNOSIS — I4729 Other ventricular tachycardia: Secondary | ICD-10-CM | POA: Diagnosis not present

## 2023-03-24 DIAGNOSIS — R600 Localized edema: Secondary | ICD-10-CM | POA: Diagnosis not present

## 2023-03-24 DIAGNOSIS — L89019 Pressure ulcer of right elbow, unspecified stage: Secondary | ICD-10-CM | POA: Diagnosis not present

## 2023-03-24 DIAGNOSIS — M4802 Spinal stenosis, cervical region: Secondary | ICD-10-CM | POA: Diagnosis not present

## 2023-03-24 DIAGNOSIS — J9611 Chronic respiratory failure with hypoxia: Secondary | ICD-10-CM | POA: Diagnosis not present

## 2023-03-24 DIAGNOSIS — Z09 Encounter for follow-up examination after completed treatment for conditions other than malignant neoplasm: Secondary | ICD-10-CM | POA: Diagnosis not present

## 2023-03-24 DIAGNOSIS — I4729 Other ventricular tachycardia: Secondary | ICD-10-CM | POA: Diagnosis not present

## 2023-03-24 DIAGNOSIS — R509 Fever, unspecified: Secondary | ICD-10-CM | POA: Diagnosis not present

## 2023-03-24 DIAGNOSIS — L89151 Pressure ulcer of sacral region, stage 1: Secondary | ICD-10-CM | POA: Diagnosis not present

## 2023-03-24 DIAGNOSIS — F0280 Dementia in other diseases classified elsewhere without behavioral disturbance: Secondary | ICD-10-CM | POA: Diagnosis not present

## 2023-03-24 DIAGNOSIS — G40209 Localization-related (focal) (partial) symptomatic epilepsy and epileptic syndromes with complex partial seizures, not intractable, without status epilepticus: Secondary | ICD-10-CM | POA: Diagnosis not present

## 2023-03-24 DIAGNOSIS — M47819 Spondylosis without myelopathy or radiculopathy, site unspecified: Secondary | ICD-10-CM | POA: Diagnosis not present

## 2023-03-24 DIAGNOSIS — M19041 Primary osteoarthritis, right hand: Secondary | ICD-10-CM | POA: Diagnosis not present

## 2023-03-24 DIAGNOSIS — M19042 Primary osteoarthritis, left hand: Secondary | ICD-10-CM | POA: Diagnosis not present

## 2023-03-24 DIAGNOSIS — G3109 Other frontotemporal dementia: Secondary | ICD-10-CM | POA: Diagnosis not present
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
## 2023-03-26 DIAGNOSIS — I4729 Other ventricular tachycardia: Secondary | ICD-10-CM | POA: Diagnosis not present

## 2023-03-26 DIAGNOSIS — G3109 Other frontotemporal dementia: Secondary | ICD-10-CM | POA: Diagnosis not present

## 2023-03-26 DIAGNOSIS — F0280 Dementia in other diseases classified elsewhere without behavioral disturbance: Secondary | ICD-10-CM | POA: Diagnosis not present

## 2023-03-26 DIAGNOSIS — M47819 Spondylosis without myelopathy or radiculopathy, site unspecified: Secondary | ICD-10-CM | POA: Diagnosis not present

## 2023-03-26 DIAGNOSIS — M19041 Primary osteoarthritis, right hand: Secondary | ICD-10-CM | POA: Diagnosis not present

## 2023-03-26 DIAGNOSIS — G40209 Localization-related (focal) (partial) symptomatic epilepsy and epileptic syndromes with complex partial seizures, not intractable, without status epilepticus: Secondary | ICD-10-CM | POA: Diagnosis not present

## 2023-03-26 DIAGNOSIS — M4802 Spinal stenosis, cervical region: Secondary | ICD-10-CM | POA: Diagnosis not present

## 2023-03-26 DIAGNOSIS — L89151 Pressure ulcer of sacral region, stage 1: Secondary | ICD-10-CM | POA: Diagnosis not present

## 2023-03-26 DIAGNOSIS — M19042 Primary osteoarthritis, left hand: Secondary | ICD-10-CM | POA: Diagnosis not present

## 2023-03-28 DIAGNOSIS — M4802 Spinal stenosis, cervical region: Secondary | ICD-10-CM | POA: Diagnosis not present

## 2023-03-28 DIAGNOSIS — M19041 Primary osteoarthritis, right hand: Secondary | ICD-10-CM | POA: Diagnosis not present

## 2023-03-28 DIAGNOSIS — I4729 Other ventricular tachycardia: Secondary | ICD-10-CM | POA: Diagnosis not present

## 2023-03-28 DIAGNOSIS — F0280 Dementia in other diseases classified elsewhere without behavioral disturbance: Secondary | ICD-10-CM | POA: Diagnosis not present

## 2023-03-28 DIAGNOSIS — G40209 Localization-related (focal) (partial) symptomatic epilepsy and epileptic syndromes with complex partial seizures, not intractable, without status epilepticus: Secondary | ICD-10-CM | POA: Diagnosis not present

## 2023-03-28 DIAGNOSIS — M19042 Primary osteoarthritis, left hand: Secondary | ICD-10-CM | POA: Diagnosis not present

## 2023-03-28 DIAGNOSIS — G3109 Other frontotemporal dementia: Secondary | ICD-10-CM | POA: Diagnosis not present

## 2023-03-28 DIAGNOSIS — R1310 Dysphagia, unspecified: Secondary | ICD-10-CM | POA: Diagnosis not present

## 2023-03-28 DIAGNOSIS — M47819 Spondylosis without myelopathy or radiculopathy, site unspecified: Secondary | ICD-10-CM | POA: Diagnosis not present

## 2023-03-29 ENCOUNTER — Ambulatory Visit: Payer: Medicare PPO | Admitting: Neurology

## 2023-03-29 DIAGNOSIS — M19041 Primary osteoarthritis, right hand: Secondary | ICD-10-CM | POA: Diagnosis not present

## 2023-03-29 DIAGNOSIS — M4802 Spinal stenosis, cervical region: Secondary | ICD-10-CM | POA: Diagnosis not present

## 2023-03-29 DIAGNOSIS — R1310 Dysphagia, unspecified: Secondary | ICD-10-CM | POA: Diagnosis not present

## 2023-03-29 DIAGNOSIS — F0280 Dementia in other diseases classified elsewhere without behavioral disturbance: Secondary | ICD-10-CM | POA: Diagnosis not present

## 2023-03-29 DIAGNOSIS — G3109 Other frontotemporal dementia: Secondary | ICD-10-CM | POA: Diagnosis not present

## 2023-03-29 DIAGNOSIS — G40209 Localization-related (focal) (partial) symptomatic epilepsy and epileptic syndromes with complex partial seizures, not intractable, without status epilepticus: Secondary | ICD-10-CM | POA: Diagnosis not present

## 2023-03-29 DIAGNOSIS — M19042 Primary osteoarthritis, left hand: Secondary | ICD-10-CM | POA: Diagnosis not present

## 2023-03-29 DIAGNOSIS — M47819 Spondylosis without myelopathy or radiculopathy, site unspecified: Secondary | ICD-10-CM | POA: Diagnosis not present

## 2023-03-29 DIAGNOSIS — I4729 Other ventricular tachycardia: Secondary | ICD-10-CM | POA: Diagnosis not present

## 2023-03-31 DIAGNOSIS — G3109 Other frontotemporal dementia: Secondary | ICD-10-CM | POA: Diagnosis not present

## 2023-03-31 DIAGNOSIS — G40209 Localization-related (focal) (partial) symptomatic epilepsy and epileptic syndromes with complex partial seizures, not intractable, without status epilepticus: Secondary | ICD-10-CM | POA: Diagnosis not present

## 2023-03-31 DIAGNOSIS — M19042 Primary osteoarthritis, left hand: Secondary | ICD-10-CM | POA: Diagnosis not present

## 2023-03-31 DIAGNOSIS — M47819 Spondylosis without myelopathy or radiculopathy, site unspecified: Secondary | ICD-10-CM | POA: Diagnosis not present

## 2023-03-31 DIAGNOSIS — F0280 Dementia in other diseases classified elsewhere without behavioral disturbance: Secondary | ICD-10-CM | POA: Diagnosis not present

## 2023-03-31 DIAGNOSIS — R1310 Dysphagia, unspecified: Secondary | ICD-10-CM | POA: Diagnosis not present

## 2023-03-31 DIAGNOSIS — M19041 Primary osteoarthritis, right hand: Secondary | ICD-10-CM | POA: Diagnosis not present

## 2023-03-31 DIAGNOSIS — I4729 Other ventricular tachycardia: Secondary | ICD-10-CM | POA: Diagnosis not present

## 2023-03-31 DIAGNOSIS — M4802 Spinal stenosis, cervical region: Secondary | ICD-10-CM | POA: Diagnosis not present

## 2023-04-02 DIAGNOSIS — M19041 Primary osteoarthritis, right hand: Secondary | ICD-10-CM | POA: Diagnosis not present

## 2023-04-02 DIAGNOSIS — M47819 Spondylosis without myelopathy or radiculopathy, site unspecified: Secondary | ICD-10-CM | POA: Diagnosis not present

## 2023-04-02 DIAGNOSIS — R1310 Dysphagia, unspecified: Secondary | ICD-10-CM | POA: Diagnosis not present

## 2023-04-02 DIAGNOSIS — G40209 Localization-related (focal) (partial) symptomatic epilepsy and epileptic syndromes with complex partial seizures, not intractable, without status epilepticus: Secondary | ICD-10-CM | POA: Diagnosis not present

## 2023-04-02 DIAGNOSIS — I4729 Other ventricular tachycardia: Secondary | ICD-10-CM | POA: Diagnosis not present

## 2023-04-02 DIAGNOSIS — M4802 Spinal stenosis, cervical region: Secondary | ICD-10-CM | POA: Diagnosis not present

## 2023-04-02 DIAGNOSIS — M19042 Primary osteoarthritis, left hand: Secondary | ICD-10-CM | POA: Diagnosis not present

## 2023-04-02 DIAGNOSIS — F0280 Dementia in other diseases classified elsewhere without behavioral disturbance: Secondary | ICD-10-CM | POA: Diagnosis not present

## 2023-04-02 DIAGNOSIS — G3109 Other frontotemporal dementia: Secondary | ICD-10-CM | POA: Diagnosis not present

## 2023-04-05 DIAGNOSIS — M19042 Primary osteoarthritis, left hand: Secondary | ICD-10-CM | POA: Diagnosis not present

## 2023-04-05 DIAGNOSIS — M4802 Spinal stenosis, cervical region: Secondary | ICD-10-CM | POA: Diagnosis not present

## 2023-04-05 DIAGNOSIS — F0280 Dementia in other diseases classified elsewhere without behavioral disturbance: Secondary | ICD-10-CM | POA: Diagnosis not present

## 2023-04-05 DIAGNOSIS — G40209 Localization-related (focal) (partial) symptomatic epilepsy and epileptic syndromes with complex partial seizures, not intractable, without status epilepticus: Secondary | ICD-10-CM | POA: Diagnosis not present

## 2023-04-05 DIAGNOSIS — I4729 Other ventricular tachycardia: Secondary | ICD-10-CM | POA: Diagnosis not present

## 2023-04-05 DIAGNOSIS — R1310 Dysphagia, unspecified: Secondary | ICD-10-CM | POA: Diagnosis not present

## 2023-04-05 DIAGNOSIS — M47819 Spondylosis without myelopathy or radiculopathy, site unspecified: Secondary | ICD-10-CM | POA: Diagnosis not present

## 2023-04-05 DIAGNOSIS — G3109 Other frontotemporal dementia: Secondary | ICD-10-CM | POA: Diagnosis not present

## 2023-04-05 DIAGNOSIS — M19041 Primary osteoarthritis, right hand: Secondary | ICD-10-CM | POA: Diagnosis not present

## 2023-04-06 DIAGNOSIS — M4802 Spinal stenosis, cervical region: Secondary | ICD-10-CM | POA: Diagnosis not present

## 2023-04-06 DIAGNOSIS — G40209 Localization-related (focal) (partial) symptomatic epilepsy and epileptic syndromes with complex partial seizures, not intractable, without status epilepticus: Secondary | ICD-10-CM | POA: Diagnosis not present

## 2023-04-06 DIAGNOSIS — G3109 Other frontotemporal dementia: Secondary | ICD-10-CM | POA: Diagnosis not present

## 2023-04-06 DIAGNOSIS — M19042 Primary osteoarthritis, left hand: Secondary | ICD-10-CM | POA: Diagnosis not present

## 2023-04-06 DIAGNOSIS — F0280 Dementia in other diseases classified elsewhere without behavioral disturbance: Secondary | ICD-10-CM | POA: Diagnosis not present

## 2023-04-06 DIAGNOSIS — R1310 Dysphagia, unspecified: Secondary | ICD-10-CM | POA: Diagnosis not present

## 2023-04-06 DIAGNOSIS — M19041 Primary osteoarthritis, right hand: Secondary | ICD-10-CM | POA: Diagnosis not present

## 2023-04-06 DIAGNOSIS — I4729 Other ventricular tachycardia: Secondary | ICD-10-CM | POA: Diagnosis not present

## 2023-04-06 DIAGNOSIS — M47819 Spondylosis without myelopathy or radiculopathy, site unspecified: Secondary | ICD-10-CM | POA: Diagnosis not present

## 2023-04-08 DIAGNOSIS — M4802 Spinal stenosis, cervical region: Secondary | ICD-10-CM | POA: Diagnosis not present

## 2023-04-08 DIAGNOSIS — I4729 Other ventricular tachycardia: Secondary | ICD-10-CM | POA: Diagnosis not present

## 2023-04-08 DIAGNOSIS — R1310 Dysphagia, unspecified: Secondary | ICD-10-CM | POA: Diagnosis not present

## 2023-04-08 DIAGNOSIS — M19041 Primary osteoarthritis, right hand: Secondary | ICD-10-CM | POA: Diagnosis not present

## 2023-04-08 DIAGNOSIS — M47819 Spondylosis without myelopathy or radiculopathy, site unspecified: Secondary | ICD-10-CM | POA: Diagnosis not present

## 2023-04-08 DIAGNOSIS — F0280 Dementia in other diseases classified elsewhere without behavioral disturbance: Secondary | ICD-10-CM | POA: Diagnosis not present

## 2023-04-08 DIAGNOSIS — G3109 Other frontotemporal dementia: Secondary | ICD-10-CM | POA: Diagnosis not present

## 2023-04-08 DIAGNOSIS — M19042 Primary osteoarthritis, left hand: Secondary | ICD-10-CM | POA: Diagnosis not present

## 2023-04-08 DIAGNOSIS — G40209 Localization-related (focal) (partial) symptomatic epilepsy and epileptic syndromes with complex partial seizures, not intractable, without status epilepticus: Secondary | ICD-10-CM | POA: Diagnosis not present

## 2023-04-13 ENCOUNTER — Encounter: Payer: Self-pay | Admitting: Neurology

## 2023-04-13 DIAGNOSIS — M4802 Spinal stenosis, cervical region: Secondary | ICD-10-CM | POA: Diagnosis not present

## 2023-04-13 DIAGNOSIS — I4729 Other ventricular tachycardia: Secondary | ICD-10-CM | POA: Diagnosis not present

## 2023-04-13 DIAGNOSIS — M19042 Primary osteoarthritis, left hand: Secondary | ICD-10-CM | POA: Diagnosis not present

## 2023-04-13 DIAGNOSIS — R1310 Dysphagia, unspecified: Secondary | ICD-10-CM | POA: Diagnosis not present

## 2023-04-13 DIAGNOSIS — G3109 Other frontotemporal dementia: Secondary | ICD-10-CM | POA: Diagnosis not present

## 2023-04-13 DIAGNOSIS — M19041 Primary osteoarthritis, right hand: Secondary | ICD-10-CM | POA: Diagnosis not present

## 2023-04-13 DIAGNOSIS — M47819 Spondylosis without myelopathy or radiculopathy, site unspecified: Secondary | ICD-10-CM | POA: Diagnosis not present

## 2023-04-13 DIAGNOSIS — F0280 Dementia in other diseases classified elsewhere without behavioral disturbance: Secondary | ICD-10-CM | POA: Diagnosis not present

## 2023-04-13 DIAGNOSIS — G40209 Localization-related (focal) (partial) symptomatic epilepsy and epileptic syndromes with complex partial seizures, not intractable, without status epilepticus: Secondary | ICD-10-CM | POA: Diagnosis not present

## 2023-04-14 DIAGNOSIS — I4729 Other ventricular tachycardia: Secondary | ICD-10-CM | POA: Diagnosis not present

## 2023-04-14 DIAGNOSIS — M19042 Primary osteoarthritis, left hand: Secondary | ICD-10-CM | POA: Diagnosis not present

## 2023-04-14 DIAGNOSIS — M19041 Primary osteoarthritis, right hand: Secondary | ICD-10-CM | POA: Diagnosis not present

## 2023-04-14 DIAGNOSIS — R1310 Dysphagia, unspecified: Secondary | ICD-10-CM | POA: Diagnosis not present

## 2023-04-14 DIAGNOSIS — G40209 Localization-related (focal) (partial) symptomatic epilepsy and epileptic syndromes with complex partial seizures, not intractable, without status epilepticus: Secondary | ICD-10-CM | POA: Diagnosis not present

## 2023-04-14 DIAGNOSIS — M4802 Spinal stenosis, cervical region: Secondary | ICD-10-CM | POA: Diagnosis not present

## 2023-04-14 DIAGNOSIS — F0280 Dementia in other diseases classified elsewhere without behavioral disturbance: Secondary | ICD-10-CM | POA: Diagnosis not present

## 2023-04-14 DIAGNOSIS — M47819 Spondylosis without myelopathy or radiculopathy, site unspecified: Secondary | ICD-10-CM | POA: Diagnosis not present

## 2023-04-14 DIAGNOSIS — G3109 Other frontotemporal dementia: Secondary | ICD-10-CM | POA: Diagnosis not present

## 2023-04-15 DIAGNOSIS — G40209 Localization-related (focal) (partial) symptomatic epilepsy and epileptic syndromes with complex partial seizures, not intractable, without status epilepticus: Secondary | ICD-10-CM | POA: Diagnosis not present

## 2023-04-15 DIAGNOSIS — M4802 Spinal stenosis, cervical region: Secondary | ICD-10-CM | POA: Diagnosis not present

## 2023-04-15 DIAGNOSIS — F0280 Dementia in other diseases classified elsewhere without behavioral disturbance: Secondary | ICD-10-CM | POA: Diagnosis not present

## 2023-04-15 DIAGNOSIS — M19042 Primary osteoarthritis, left hand: Secondary | ICD-10-CM | POA: Diagnosis not present

## 2023-04-15 DIAGNOSIS — M47819 Spondylosis without myelopathy or radiculopathy, site unspecified: Secondary | ICD-10-CM | POA: Diagnosis not present

## 2023-04-15 DIAGNOSIS — G3109 Other frontotemporal dementia: Secondary | ICD-10-CM | POA: Diagnosis not present

## 2023-04-15 DIAGNOSIS — M19041 Primary osteoarthritis, right hand: Secondary | ICD-10-CM | POA: Diagnosis not present

## 2023-04-15 DIAGNOSIS — I4729 Other ventricular tachycardia: Secondary | ICD-10-CM | POA: Diagnosis not present

## 2023-04-15 DIAGNOSIS — R1310 Dysphagia, unspecified: Secondary | ICD-10-CM | POA: Diagnosis not present

## 2023-04-19 DIAGNOSIS — G3109 Other frontotemporal dementia: Secondary | ICD-10-CM | POA: Diagnosis not present

## 2023-04-19 DIAGNOSIS — M4802 Spinal stenosis, cervical region: Secondary | ICD-10-CM | POA: Diagnosis not present

## 2023-04-19 DIAGNOSIS — I4729 Other ventricular tachycardia: Secondary | ICD-10-CM | POA: Diagnosis not present

## 2023-04-19 DIAGNOSIS — F0280 Dementia in other diseases classified elsewhere without behavioral disturbance: Secondary | ICD-10-CM | POA: Diagnosis not present

## 2023-04-19 DIAGNOSIS — M47819 Spondylosis without myelopathy or radiculopathy, site unspecified: Secondary | ICD-10-CM | POA: Diagnosis not present

## 2023-04-19 DIAGNOSIS — R1310 Dysphagia, unspecified: Secondary | ICD-10-CM | POA: Diagnosis not present

## 2023-04-19 DIAGNOSIS — M19042 Primary osteoarthritis, left hand: Secondary | ICD-10-CM | POA: Diagnosis not present

## 2023-04-19 DIAGNOSIS — G40209 Localization-related (focal) (partial) symptomatic epilepsy and epileptic syndromes with complex partial seizures, not intractable, without status epilepticus: Secondary | ICD-10-CM | POA: Diagnosis not present

## 2023-04-19 DIAGNOSIS — M19041 Primary osteoarthritis, right hand: Secondary | ICD-10-CM | POA: Diagnosis not present

## 2023-04-20 DIAGNOSIS — G3109 Other frontotemporal dementia: Secondary | ICD-10-CM | POA: Diagnosis not present

## 2023-04-20 DIAGNOSIS — M4802 Spinal stenosis, cervical region: Secondary | ICD-10-CM | POA: Diagnosis not present

## 2023-04-20 DIAGNOSIS — G40209 Localization-related (focal) (partial) symptomatic epilepsy and epileptic syndromes with complex partial seizures, not intractable, without status epilepticus: Secondary | ICD-10-CM | POA: Diagnosis not present

## 2023-04-20 DIAGNOSIS — I4729 Other ventricular tachycardia: Secondary | ICD-10-CM | POA: Diagnosis not present

## 2023-04-20 DIAGNOSIS — M47819 Spondylosis without myelopathy or radiculopathy, site unspecified: Secondary | ICD-10-CM | POA: Diagnosis not present

## 2023-04-20 DIAGNOSIS — M19042 Primary osteoarthritis, left hand: Secondary | ICD-10-CM | POA: Diagnosis not present

## 2023-04-20 DIAGNOSIS — R1310 Dysphagia, unspecified: Secondary | ICD-10-CM | POA: Diagnosis not present

## 2023-04-20 DIAGNOSIS — M19041 Primary osteoarthritis, right hand: Secondary | ICD-10-CM | POA: Diagnosis not present

## 2023-04-20 DIAGNOSIS — F0280 Dementia in other diseases classified elsewhere without behavioral disturbance: Secondary | ICD-10-CM | POA: Diagnosis not present

## 2023-04-21 DIAGNOSIS — G40209 Localization-related (focal) (partial) symptomatic epilepsy and epileptic syndromes with complex partial seizures, not intractable, without status epilepticus: Secondary | ICD-10-CM | POA: Diagnosis not present

## 2023-04-21 DIAGNOSIS — M4802 Spinal stenosis, cervical region: Secondary | ICD-10-CM | POA: Diagnosis not present

## 2023-04-21 DIAGNOSIS — F0280 Dementia in other diseases classified elsewhere without behavioral disturbance: Secondary | ICD-10-CM | POA: Diagnosis not present

## 2023-04-21 DIAGNOSIS — Z0000 Encounter for general adult medical examination without abnormal findings: Secondary | ICD-10-CM | POA: Diagnosis not present

## 2023-04-21 DIAGNOSIS — G3109 Other frontotemporal dementia: Secondary | ICD-10-CM | POA: Diagnosis not present

## 2023-04-21 DIAGNOSIS — I4729 Other ventricular tachycardia: Secondary | ICD-10-CM | POA: Diagnosis not present

## 2023-04-21 DIAGNOSIS — M19042 Primary osteoarthritis, left hand: Secondary | ICD-10-CM | POA: Diagnosis not present

## 2023-04-21 DIAGNOSIS — G4719 Other hypersomnia: Secondary | ICD-10-CM | POA: Diagnosis not present

## 2023-04-21 DIAGNOSIS — M19041 Primary osteoarthritis, right hand: Secondary | ICD-10-CM | POA: Diagnosis not present

## 2023-04-21 DIAGNOSIS — R1310 Dysphagia, unspecified: Secondary | ICD-10-CM | POA: Diagnosis not present

## 2023-04-21 DIAGNOSIS — L89013 Pressure ulcer of right elbow, stage 3: Secondary | ICD-10-CM | POA: Diagnosis not present

## 2023-04-21 DIAGNOSIS — M47819 Spondylosis without myelopathy or radiculopathy, site unspecified: Secondary | ICD-10-CM | POA: Diagnosis not present

## 2023-04-22 DIAGNOSIS — M4802 Spinal stenosis, cervical region: Secondary | ICD-10-CM | POA: Diagnosis not present

## 2023-04-22 DIAGNOSIS — M19041 Primary osteoarthritis, right hand: Secondary | ICD-10-CM | POA: Diagnosis not present

## 2023-04-22 DIAGNOSIS — G40209 Localization-related (focal) (partial) symptomatic epilepsy and epileptic syndromes with complex partial seizures, not intractable, without status epilepticus: Secondary | ICD-10-CM | POA: Diagnosis not present

## 2023-04-22 DIAGNOSIS — R1310 Dysphagia, unspecified: Secondary | ICD-10-CM | POA: Diagnosis not present

## 2023-04-22 DIAGNOSIS — I4729 Other ventricular tachycardia: Secondary | ICD-10-CM | POA: Diagnosis not present

## 2023-04-22 DIAGNOSIS — F0280 Dementia in other diseases classified elsewhere without behavioral disturbance: Secondary | ICD-10-CM | POA: Diagnosis not present

## 2023-04-22 DIAGNOSIS — M47819 Spondylosis without myelopathy or radiculopathy, site unspecified: Secondary | ICD-10-CM | POA: Diagnosis not present

## 2023-04-22 DIAGNOSIS — M19042 Primary osteoarthritis, left hand: Secondary | ICD-10-CM | POA: Diagnosis not present

## 2023-04-22 DIAGNOSIS — G3109 Other frontotemporal dementia: Secondary | ICD-10-CM | POA: Diagnosis not present

## 2023-04-23 DIAGNOSIS — J9601 Acute respiratory failure with hypoxia: Secondary | ICD-10-CM | POA: Diagnosis not present

## 2023-04-23 DIAGNOSIS — N390 Urinary tract infection, site not specified: Secondary | ICD-10-CM | POA: Diagnosis not present

## 2023-04-23 DIAGNOSIS — A419 Sepsis, unspecified organism: Secondary | ICD-10-CM | POA: Diagnosis not present

## 2023-04-27 DIAGNOSIS — M19042 Primary osteoarthritis, left hand: Secondary | ICD-10-CM | POA: Diagnosis not present

## 2023-04-27 DIAGNOSIS — M4802 Spinal stenosis, cervical region: Secondary | ICD-10-CM | POA: Diagnosis not present

## 2023-04-27 DIAGNOSIS — R1310 Dysphagia, unspecified: Secondary | ICD-10-CM | POA: Diagnosis not present

## 2023-04-27 DIAGNOSIS — G40209 Localization-related (focal) (partial) symptomatic epilepsy and epileptic syndromes with complex partial seizures, not intractable, without status epilepticus: Secondary | ICD-10-CM | POA: Diagnosis not present

## 2023-04-27 DIAGNOSIS — I4729 Other ventricular tachycardia: Secondary | ICD-10-CM | POA: Diagnosis not present

## 2023-04-27 DIAGNOSIS — M19041 Primary osteoarthritis, right hand: Secondary | ICD-10-CM | POA: Diagnosis not present

## 2023-04-27 DIAGNOSIS — F0280 Dementia in other diseases classified elsewhere without behavioral disturbance: Secondary | ICD-10-CM | POA: Diagnosis not present

## 2023-04-27 DIAGNOSIS — G3109 Other frontotemporal dementia: Secondary | ICD-10-CM | POA: Diagnosis not present

## 2023-04-27 DIAGNOSIS — M47819 Spondylosis without myelopathy or radiculopathy, site unspecified: Secondary | ICD-10-CM | POA: Diagnosis not present

## 2023-04-28 DIAGNOSIS — G3109 Other frontotemporal dementia: Secondary | ICD-10-CM | POA: Diagnosis not present

## 2023-04-28 DIAGNOSIS — M4802 Spinal stenosis, cervical region: Secondary | ICD-10-CM | POA: Diagnosis not present

## 2023-04-28 DIAGNOSIS — I4729 Other ventricular tachycardia: Secondary | ICD-10-CM | POA: Diagnosis not present

## 2023-04-28 DIAGNOSIS — R1310 Dysphagia, unspecified: Secondary | ICD-10-CM | POA: Diagnosis not present

## 2023-04-28 DIAGNOSIS — M19042 Primary osteoarthritis, left hand: Secondary | ICD-10-CM | POA: Diagnosis not present

## 2023-04-28 DIAGNOSIS — M19041 Primary osteoarthritis, right hand: Secondary | ICD-10-CM | POA: Diagnosis not present

## 2023-04-28 DIAGNOSIS — G40209 Localization-related (focal) (partial) symptomatic epilepsy and epileptic syndromes with complex partial seizures, not intractable, without status epilepticus: Secondary | ICD-10-CM | POA: Diagnosis not present

## 2023-04-28 DIAGNOSIS — M47819 Spondylosis without myelopathy or radiculopathy, site unspecified: Secondary | ICD-10-CM | POA: Diagnosis not present

## 2023-04-28 DIAGNOSIS — F0280 Dementia in other diseases classified elsewhere without behavioral disturbance: Secondary | ICD-10-CM | POA: Diagnosis not present

## 2023-04-29 DIAGNOSIS — M19041 Primary osteoarthritis, right hand: Secondary | ICD-10-CM | POA: Diagnosis not present

## 2023-04-29 DIAGNOSIS — G40209 Localization-related (focal) (partial) symptomatic epilepsy and epileptic syndromes with complex partial seizures, not intractable, without status epilepticus: Secondary | ICD-10-CM | POA: Diagnosis not present

## 2023-04-29 DIAGNOSIS — R1310 Dysphagia, unspecified: Secondary | ICD-10-CM | POA: Diagnosis not present

## 2023-04-29 DIAGNOSIS — M47819 Spondylosis without myelopathy or radiculopathy, site unspecified: Secondary | ICD-10-CM | POA: Diagnosis not present

## 2023-04-29 DIAGNOSIS — I4729 Other ventricular tachycardia: Secondary | ICD-10-CM | POA: Diagnosis not present

## 2023-04-29 DIAGNOSIS — M19042 Primary osteoarthritis, left hand: Secondary | ICD-10-CM | POA: Diagnosis not present

## 2023-04-29 DIAGNOSIS — G3109 Other frontotemporal dementia: Secondary | ICD-10-CM | POA: Diagnosis not present

## 2023-04-29 DIAGNOSIS — F0280 Dementia in other diseases classified elsewhere without behavioral disturbance: Secondary | ICD-10-CM | POA: Diagnosis not present

## 2023-04-29 DIAGNOSIS — M4802 Spinal stenosis, cervical region: Secondary | ICD-10-CM | POA: Diagnosis not present

## 2023-05-03 DIAGNOSIS — M19041 Primary osteoarthritis, right hand: Secondary | ICD-10-CM | POA: Diagnosis not present

## 2023-05-03 DIAGNOSIS — R1310 Dysphagia, unspecified: Secondary | ICD-10-CM | POA: Diagnosis not present

## 2023-05-03 DIAGNOSIS — M19042 Primary osteoarthritis, left hand: Secondary | ICD-10-CM | POA: Diagnosis not present

## 2023-05-03 DIAGNOSIS — M4802 Spinal stenosis, cervical region: Secondary | ICD-10-CM | POA: Diagnosis not present

## 2023-05-03 DIAGNOSIS — I4729 Other ventricular tachycardia: Secondary | ICD-10-CM | POA: Diagnosis not present

## 2023-05-03 DIAGNOSIS — M47819 Spondylosis without myelopathy or radiculopathy, site unspecified: Secondary | ICD-10-CM | POA: Diagnosis not present

## 2023-05-03 DIAGNOSIS — G3109 Other frontotemporal dementia: Secondary | ICD-10-CM | POA: Diagnosis not present

## 2023-05-03 DIAGNOSIS — G40209 Localization-related (focal) (partial) symptomatic epilepsy and epileptic syndromes with complex partial seizures, not intractable, without status epilepticus: Secondary | ICD-10-CM | POA: Diagnosis not present

## 2023-05-03 DIAGNOSIS — F0280 Dementia in other diseases classified elsewhere without behavioral disturbance: Secondary | ICD-10-CM | POA: Diagnosis not present

## 2023-05-04 DIAGNOSIS — G40209 Localization-related (focal) (partial) symptomatic epilepsy and epileptic syndromes with complex partial seizures, not intractable, without status epilepticus: Secondary | ICD-10-CM | POA: Diagnosis not present

## 2023-05-04 DIAGNOSIS — M19041 Primary osteoarthritis, right hand: Secondary | ICD-10-CM | POA: Diagnosis not present

## 2023-05-04 DIAGNOSIS — M4802 Spinal stenosis, cervical region: Secondary | ICD-10-CM | POA: Diagnosis not present

## 2023-05-04 DIAGNOSIS — M47819 Spondylosis without myelopathy or radiculopathy, site unspecified: Secondary | ICD-10-CM | POA: Diagnosis not present

## 2023-05-04 DIAGNOSIS — G3109 Other frontotemporal dementia: Secondary | ICD-10-CM | POA: Diagnosis not present

## 2023-05-04 DIAGNOSIS — I4729 Other ventricular tachycardia: Secondary | ICD-10-CM | POA: Diagnosis not present

## 2023-05-04 DIAGNOSIS — M19042 Primary osteoarthritis, left hand: Secondary | ICD-10-CM | POA: Diagnosis not present

## 2023-05-04 DIAGNOSIS — R1310 Dysphagia, unspecified: Secondary | ICD-10-CM | POA: Diagnosis not present

## 2023-05-04 DIAGNOSIS — F0280 Dementia in other diseases classified elsewhere without behavioral disturbance: Secondary | ICD-10-CM | POA: Diagnosis not present

## 2023-05-05 DIAGNOSIS — M4802 Spinal stenosis, cervical region: Secondary | ICD-10-CM | POA: Diagnosis not present

## 2023-05-05 DIAGNOSIS — G40209 Localization-related (focal) (partial) symptomatic epilepsy and epileptic syndromes with complex partial seizures, not intractable, without status epilepticus: Secondary | ICD-10-CM | POA: Diagnosis not present

## 2023-05-05 DIAGNOSIS — G3109 Other frontotemporal dementia: Secondary | ICD-10-CM | POA: Diagnosis not present

## 2023-05-05 DIAGNOSIS — R1310 Dysphagia, unspecified: Secondary | ICD-10-CM | POA: Diagnosis not present

## 2023-05-05 DIAGNOSIS — M19042 Primary osteoarthritis, left hand: Secondary | ICD-10-CM | POA: Diagnosis not present

## 2023-05-05 DIAGNOSIS — M47819 Spondylosis without myelopathy or radiculopathy, site unspecified: Secondary | ICD-10-CM | POA: Diagnosis not present

## 2023-05-05 DIAGNOSIS — F0280 Dementia in other diseases classified elsewhere without behavioral disturbance: Secondary | ICD-10-CM | POA: Diagnosis not present

## 2023-05-05 DIAGNOSIS — I4729 Other ventricular tachycardia: Secondary | ICD-10-CM | POA: Diagnosis not present

## 2023-05-05 DIAGNOSIS — M19041 Primary osteoarthritis, right hand: Secondary | ICD-10-CM | POA: Diagnosis not present

## 2023-05-08 ENCOUNTER — Other Ambulatory Visit: Payer: Self-pay | Admitting: Cardiology

## 2023-05-10 DIAGNOSIS — M19041 Primary osteoarthritis, right hand: Secondary | ICD-10-CM | POA: Diagnosis not present

## 2023-05-10 DIAGNOSIS — G40209 Localization-related (focal) (partial) symptomatic epilepsy and epileptic syndromes with complex partial seizures, not intractable, without status epilepticus: Secondary | ICD-10-CM | POA: Diagnosis not present

## 2023-05-10 DIAGNOSIS — M47819 Spondylosis without myelopathy or radiculopathy, site unspecified: Secondary | ICD-10-CM | POA: Diagnosis not present

## 2023-05-10 DIAGNOSIS — M19042 Primary osteoarthritis, left hand: Secondary | ICD-10-CM | POA: Diagnosis not present

## 2023-05-10 DIAGNOSIS — R1310 Dysphagia, unspecified: Secondary | ICD-10-CM | POA: Diagnosis not present

## 2023-05-10 DIAGNOSIS — M4802 Spinal stenosis, cervical region: Secondary | ICD-10-CM | POA: Diagnosis not present

## 2023-05-10 DIAGNOSIS — I4729 Other ventricular tachycardia: Secondary | ICD-10-CM | POA: Diagnosis not present

## 2023-05-10 DIAGNOSIS — G3109 Other frontotemporal dementia: Secondary | ICD-10-CM | POA: Diagnosis not present

## 2023-05-10 DIAGNOSIS — F0280 Dementia in other diseases classified elsewhere without behavioral disturbance: Secondary | ICD-10-CM | POA: Diagnosis not present

## 2023-05-11 DIAGNOSIS — N3946 Mixed incontinence: Secondary | ICD-10-CM | POA: Diagnosis not present

## 2023-05-11 DIAGNOSIS — N390 Urinary tract infection, site not specified: Secondary | ICD-10-CM | POA: Diagnosis not present

## 2023-05-11 DIAGNOSIS — R350 Frequency of micturition: Secondary | ICD-10-CM | POA: Diagnosis not present

## 2023-05-12 DIAGNOSIS — F0280 Dementia in other diseases classified elsewhere without behavioral disturbance: Secondary | ICD-10-CM | POA: Diagnosis not present

## 2023-05-12 DIAGNOSIS — M19042 Primary osteoarthritis, left hand: Secondary | ICD-10-CM | POA: Diagnosis not present

## 2023-05-12 DIAGNOSIS — I4729 Other ventricular tachycardia: Secondary | ICD-10-CM | POA: Diagnosis not present

## 2023-05-12 DIAGNOSIS — M4802 Spinal stenosis, cervical region: Secondary | ICD-10-CM | POA: Diagnosis not present

## 2023-05-12 DIAGNOSIS — G3109 Other frontotemporal dementia: Secondary | ICD-10-CM | POA: Diagnosis not present

## 2023-05-12 DIAGNOSIS — R1310 Dysphagia, unspecified: Secondary | ICD-10-CM | POA: Diagnosis not present

## 2023-05-12 DIAGNOSIS — G40209 Localization-related (focal) (partial) symptomatic epilepsy and epileptic syndromes with complex partial seizures, not intractable, without status epilepticus: Secondary | ICD-10-CM | POA: Diagnosis not present

## 2023-05-12 DIAGNOSIS — M19041 Primary osteoarthritis, right hand: Secondary | ICD-10-CM | POA: Diagnosis not present

## 2023-05-12 DIAGNOSIS — M47819 Spondylosis without myelopathy or radiculopathy, site unspecified: Secondary | ICD-10-CM | POA: Diagnosis not present

## 2023-05-17 DIAGNOSIS — M47819 Spondylosis without myelopathy or radiculopathy, site unspecified: Secondary | ICD-10-CM | POA: Diagnosis not present

## 2023-05-17 DIAGNOSIS — M19041 Primary osteoarthritis, right hand: Secondary | ICD-10-CM | POA: Diagnosis not present

## 2023-05-17 DIAGNOSIS — M4802 Spinal stenosis, cervical region: Secondary | ICD-10-CM | POA: Diagnosis not present

## 2023-05-17 DIAGNOSIS — M19042 Primary osteoarthritis, left hand: Secondary | ICD-10-CM | POA: Diagnosis not present

## 2023-05-17 DIAGNOSIS — I4729 Other ventricular tachycardia: Secondary | ICD-10-CM | POA: Diagnosis not present

## 2023-05-17 DIAGNOSIS — R1310 Dysphagia, unspecified: Secondary | ICD-10-CM | POA: Diagnosis not present

## 2023-05-17 DIAGNOSIS — G3109 Other frontotemporal dementia: Secondary | ICD-10-CM | POA: Diagnosis not present

## 2023-05-17 DIAGNOSIS — F0280 Dementia in other diseases classified elsewhere without behavioral disturbance: Secondary | ICD-10-CM | POA: Diagnosis not present

## 2023-05-17 DIAGNOSIS — G40209 Localization-related (focal) (partial) symptomatic epilepsy and epileptic syndromes with complex partial seizures, not intractable, without status epilepticus: Secondary | ICD-10-CM | POA: Diagnosis not present

## 2023-05-18 DIAGNOSIS — M47819 Spondylosis without myelopathy or radiculopathy, site unspecified: Secondary | ICD-10-CM | POA: Diagnosis not present

## 2023-05-18 DIAGNOSIS — F0280 Dementia in other diseases classified elsewhere without behavioral disturbance: Secondary | ICD-10-CM | POA: Diagnosis not present

## 2023-05-18 DIAGNOSIS — I4729 Other ventricular tachycardia: Secondary | ICD-10-CM | POA: Diagnosis not present

## 2023-05-18 DIAGNOSIS — G3109 Other frontotemporal dementia: Secondary | ICD-10-CM | POA: Diagnosis not present

## 2023-05-18 DIAGNOSIS — M19042 Primary osteoarthritis, left hand: Secondary | ICD-10-CM | POA: Diagnosis not present

## 2023-05-18 DIAGNOSIS — R1310 Dysphagia, unspecified: Secondary | ICD-10-CM | POA: Diagnosis not present

## 2023-05-18 DIAGNOSIS — M4802 Spinal stenosis, cervical region: Secondary | ICD-10-CM | POA: Diagnosis not present

## 2023-05-18 DIAGNOSIS — G40209 Localization-related (focal) (partial) symptomatic epilepsy and epileptic syndromes with complex partial seizures, not intractable, without status epilepticus: Secondary | ICD-10-CM | POA: Diagnosis not present

## 2023-05-18 DIAGNOSIS — M19041 Primary osteoarthritis, right hand: Secondary | ICD-10-CM | POA: Diagnosis not present

## 2023-05-19 DIAGNOSIS — F0280 Dementia in other diseases classified elsewhere without behavioral disturbance: Secondary | ICD-10-CM | POA: Diagnosis not present

## 2023-05-19 DIAGNOSIS — I4729 Other ventricular tachycardia: Secondary | ICD-10-CM | POA: Diagnosis not present

## 2023-05-19 DIAGNOSIS — R1310 Dysphagia, unspecified: Secondary | ICD-10-CM | POA: Diagnosis not present

## 2023-05-19 DIAGNOSIS — M4802 Spinal stenosis, cervical region: Secondary | ICD-10-CM | POA: Diagnosis not present

## 2023-05-19 DIAGNOSIS — M47819 Spondylosis without myelopathy or radiculopathy, site unspecified: Secondary | ICD-10-CM | POA: Diagnosis not present

## 2023-05-19 DIAGNOSIS — G3109 Other frontotemporal dementia: Secondary | ICD-10-CM | POA: Diagnosis not present

## 2023-05-19 DIAGNOSIS — G40209 Localization-related (focal) (partial) symptomatic epilepsy and epileptic syndromes with complex partial seizures, not intractable, without status epilepticus: Secondary | ICD-10-CM | POA: Diagnosis not present

## 2023-05-19 DIAGNOSIS — M19042 Primary osteoarthritis, left hand: Secondary | ICD-10-CM | POA: Diagnosis not present

## 2023-05-19 DIAGNOSIS — M19041 Primary osteoarthritis, right hand: Secondary | ICD-10-CM | POA: Diagnosis not present

## 2023-05-21 DIAGNOSIS — M4802 Spinal stenosis, cervical region: Secondary | ICD-10-CM | POA: Diagnosis not present

## 2023-05-21 DIAGNOSIS — I4729 Other ventricular tachycardia: Secondary | ICD-10-CM | POA: Diagnosis not present

## 2023-05-21 DIAGNOSIS — F0280 Dementia in other diseases classified elsewhere without behavioral disturbance: Secondary | ICD-10-CM | POA: Diagnosis not present

## 2023-05-21 DIAGNOSIS — M47819 Spondylosis without myelopathy or radiculopathy, site unspecified: Secondary | ICD-10-CM | POA: Diagnosis not present

## 2023-05-21 DIAGNOSIS — R1310 Dysphagia, unspecified: Secondary | ICD-10-CM | POA: Diagnosis not present

## 2023-05-21 DIAGNOSIS — G40209 Localization-related (focal) (partial) symptomatic epilepsy and epileptic syndromes with complex partial seizures, not intractable, without status epilepticus: Secondary | ICD-10-CM | POA: Diagnosis not present

## 2023-05-21 DIAGNOSIS — M19041 Primary osteoarthritis, right hand: Secondary | ICD-10-CM | POA: Diagnosis not present

## 2023-05-21 DIAGNOSIS — M19042 Primary osteoarthritis, left hand: Secondary | ICD-10-CM | POA: Diagnosis not present

## 2023-05-21 DIAGNOSIS — G3109 Other frontotemporal dementia: Secondary | ICD-10-CM | POA: Diagnosis not present

## 2023-05-23 DIAGNOSIS — J9601 Acute respiratory failure with hypoxia: Secondary | ICD-10-CM | POA: Diagnosis not present

## 2023-05-23 DIAGNOSIS — N390 Urinary tract infection, site not specified: Secondary | ICD-10-CM | POA: Diagnosis not present

## 2023-05-23 DIAGNOSIS — A419 Sepsis, unspecified organism: Secondary | ICD-10-CM | POA: Diagnosis not present

## 2023-05-25 DIAGNOSIS — M47819 Spondylosis without myelopathy or radiculopathy, site unspecified: Secondary | ICD-10-CM | POA: Diagnosis not present

## 2023-05-25 DIAGNOSIS — M19041 Primary osteoarthritis, right hand: Secondary | ICD-10-CM | POA: Diagnosis not present

## 2023-05-25 DIAGNOSIS — I4729 Other ventricular tachycardia: Secondary | ICD-10-CM | POA: Diagnosis not present

## 2023-05-25 DIAGNOSIS — F0280 Dementia in other diseases classified elsewhere without behavioral disturbance: Secondary | ICD-10-CM | POA: Diagnosis not present

## 2023-05-25 DIAGNOSIS — G40209 Localization-related (focal) (partial) symptomatic epilepsy and epileptic syndromes with complex partial seizures, not intractable, without status epilepticus: Secondary | ICD-10-CM | POA: Diagnosis not present

## 2023-05-25 DIAGNOSIS — R1310 Dysphagia, unspecified: Secondary | ICD-10-CM | POA: Diagnosis not present

## 2023-05-25 DIAGNOSIS — G3109 Other frontotemporal dementia: Secondary | ICD-10-CM | POA: Diagnosis not present

## 2023-05-25 DIAGNOSIS — M4802 Spinal stenosis, cervical region: Secondary | ICD-10-CM | POA: Diagnosis not present

## 2023-05-25 DIAGNOSIS — M19042 Primary osteoarthritis, left hand: Secondary | ICD-10-CM | POA: Diagnosis not present

## 2023-05-26 DIAGNOSIS — I4729 Other ventricular tachycardia: Secondary | ICD-10-CM | POA: Diagnosis not present

## 2023-05-26 DIAGNOSIS — M4802 Spinal stenosis, cervical region: Secondary | ICD-10-CM | POA: Diagnosis not present

## 2023-05-26 DIAGNOSIS — M19041 Primary osteoarthritis, right hand: Secondary | ICD-10-CM | POA: Diagnosis not present

## 2023-05-26 DIAGNOSIS — R1310 Dysphagia, unspecified: Secondary | ICD-10-CM | POA: Diagnosis not present

## 2023-05-26 DIAGNOSIS — M47819 Spondylosis without myelopathy or radiculopathy, site unspecified: Secondary | ICD-10-CM | POA: Diagnosis not present

## 2023-05-26 DIAGNOSIS — G40209 Localization-related (focal) (partial) symptomatic epilepsy and epileptic syndromes with complex partial seizures, not intractable, without status epilepticus: Secondary | ICD-10-CM | POA: Diagnosis not present

## 2023-05-26 DIAGNOSIS — G3109 Other frontotemporal dementia: Secondary | ICD-10-CM | POA: Diagnosis not present

## 2023-05-26 DIAGNOSIS — F0280 Dementia in other diseases classified elsewhere without behavioral disturbance: Secondary | ICD-10-CM | POA: Diagnosis not present

## 2023-05-26 DIAGNOSIS — M19042 Primary osteoarthritis, left hand: Secondary | ICD-10-CM | POA: Diagnosis not present

## 2023-06-06 DIAGNOSIS — R601 Generalized edema: Secondary | ICD-10-CM | POA: Diagnosis not present

## 2023-06-06 DIAGNOSIS — L03311 Cellulitis of abdominal wall: Secondary | ICD-10-CM | POA: Diagnosis not present

## 2023-06-15 DIAGNOSIS — L89010 Pressure ulcer of right elbow, unstageable: Secondary | ICD-10-CM | POA: Diagnosis not present

## 2023-06-15 DIAGNOSIS — T17308D Unspecified foreign body in larynx causing other injury, subsequent encounter: Secondary | ICD-10-CM | POA: Diagnosis not present

## 2023-06-15 DIAGNOSIS — R600 Localized edema: Secondary | ICD-10-CM | POA: Diagnosis not present

## 2023-06-15 DIAGNOSIS — Z993 Dependence on wheelchair: Secondary | ICD-10-CM | POA: Diagnosis not present

## 2023-06-15 DIAGNOSIS — E440 Moderate protein-calorie malnutrition: Secondary | ICD-10-CM | POA: Diagnosis not present

## 2023-06-15 DIAGNOSIS — R946 Abnormal results of thyroid function studies: Secondary | ICD-10-CM | POA: Diagnosis not present

## 2023-06-16 ENCOUNTER — Other Ambulatory Visit (HOSPITAL_COMMUNITY): Payer: Self-pay | Admitting: Family Medicine

## 2023-06-16 ENCOUNTER — Ambulatory Visit (HOSPITAL_COMMUNITY)
Admission: RE | Admit: 2023-06-16 | Discharge: 2023-06-16 | Disposition: A | Discharge status: Home / Self Care | Payer: Medicare PPO | Source: Ambulatory Visit | Attending: Vascular Surgery | Admitting: Vascular Surgery

## 2023-06-16 DIAGNOSIS — R600 Localized edema: Secondary | ICD-10-CM

## 2023-06-23 DIAGNOSIS — R1310 Dysphagia, unspecified: Secondary | ICD-10-CM | POA: Diagnosis not present

## 2023-06-23 DIAGNOSIS — Z96652 Presence of left artificial knee joint: Secondary | ICD-10-CM | POA: Diagnosis not present

## 2023-06-23 DIAGNOSIS — J9601 Acute respiratory failure with hypoxia: Secondary | ICD-10-CM | POA: Diagnosis not present

## 2023-06-23 DIAGNOSIS — F0280 Dementia in other diseases classified elsewhere without behavioral disturbance: Secondary | ICD-10-CM | POA: Diagnosis not present

## 2023-06-23 DIAGNOSIS — Z9181 History of falling: Secondary | ICD-10-CM | POA: Diagnosis not present

## 2023-06-23 DIAGNOSIS — N390 Urinary tract infection, site not specified: Secondary | ICD-10-CM | POA: Diagnosis not present

## 2023-06-23 DIAGNOSIS — T17308D Unspecified foreign body in larynx causing other injury, subsequent encounter: Secondary | ICD-10-CM | POA: Diagnosis not present

## 2023-06-23 DIAGNOSIS — M1990 Unspecified osteoarthritis, unspecified site: Secondary | ICD-10-CM | POA: Diagnosis not present

## 2023-06-23 DIAGNOSIS — E039 Hypothyroidism, unspecified: Secondary | ICD-10-CM | POA: Diagnosis not present

## 2023-06-23 DIAGNOSIS — A419 Sepsis, unspecified organism: Secondary | ICD-10-CM | POA: Diagnosis not present

## 2023-06-23 DIAGNOSIS — Z87891 Personal history of nicotine dependence: Secondary | ICD-10-CM | POA: Diagnosis not present

## 2023-06-23 DIAGNOSIS — G40109 Localization-related (focal) (partial) symptomatic epilepsy and epileptic syndromes with simple partial seizures, not intractable, without status epilepticus: Secondary | ICD-10-CM | POA: Diagnosis not present

## 2023-06-29 ENCOUNTER — Emergency Department (HOSPITAL_COMMUNITY): Payer: Medicare PPO

## 2023-06-29 ENCOUNTER — Other Ambulatory Visit: Payer: Self-pay

## 2023-06-29 ENCOUNTER — Encounter (HOSPITAL_COMMUNITY): Payer: Self-pay

## 2023-06-29 ENCOUNTER — Inpatient Hospital Stay (HOSPITAL_COMMUNITY)
Admission: EM | Admit: 2023-06-29 | Discharge: 2023-07-29 | DRG: 870 | Disposition: E | Payer: Medicare PPO | Attending: Internal Medicine | Admitting: Internal Medicine

## 2023-06-29 DIAGNOSIS — R0989 Other specified symptoms and signs involving the circulatory and respiratory systems: Secondary | ICD-10-CM | POA: Diagnosis not present

## 2023-06-29 DIAGNOSIS — Z79899 Other long term (current) drug therapy: Secondary | ICD-10-CM

## 2023-06-29 DIAGNOSIS — D6489 Other specified anemias: Secondary | ICD-10-CM | POA: Diagnosis present

## 2023-06-29 DIAGNOSIS — R627 Adult failure to thrive: Secondary | ICD-10-CM | POA: Diagnosis present

## 2023-06-29 DIAGNOSIS — R578 Other shock: Secondary | ICD-10-CM | POA: Diagnosis not present

## 2023-06-29 DIAGNOSIS — Z87820 Personal history of traumatic brain injury: Secondary | ICD-10-CM

## 2023-06-29 DIAGNOSIS — J168 Pneumonia due to other specified infectious organisms: Secondary | ICD-10-CM | POA: Diagnosis not present

## 2023-06-29 DIAGNOSIS — Z515 Encounter for palliative care: Secondary | ICD-10-CM

## 2023-06-29 DIAGNOSIS — G9341 Metabolic encephalopathy: Secondary | ICD-10-CM | POA: Diagnosis present

## 2023-06-29 DIAGNOSIS — E871 Hypo-osmolality and hyponatremia: Secondary | ICD-10-CM | POA: Diagnosis present

## 2023-06-29 DIAGNOSIS — I472 Ventricular tachycardia, unspecified: Secondary | ICD-10-CM | POA: Diagnosis not present

## 2023-06-29 DIAGNOSIS — J189 Pneumonia, unspecified organism: Principal | ICD-10-CM

## 2023-06-29 DIAGNOSIS — I959 Hypotension, unspecified: Secondary | ICD-10-CM | POA: Diagnosis not present

## 2023-06-29 DIAGNOSIS — J9622 Acute and chronic respiratory failure with hypercapnia: Secondary | ICD-10-CM | POA: Diagnosis present

## 2023-06-29 DIAGNOSIS — J151 Pneumonia due to Pseudomonas: Secondary | ICD-10-CM | POA: Diagnosis present

## 2023-06-29 DIAGNOSIS — Z66 Do not resuscitate: Secondary | ICD-10-CM | POA: Diagnosis not present

## 2023-06-29 DIAGNOSIS — Z6832 Body mass index (BMI) 32.0-32.9, adult: Secondary | ICD-10-CM | POA: Diagnosis not present

## 2023-06-29 DIAGNOSIS — I469 Cardiac arrest, cause unspecified: Secondary | ICD-10-CM | POA: Diagnosis not present

## 2023-06-29 DIAGNOSIS — T68XXXA Hypothermia, initial encounter: Secondary | ICD-10-CM | POA: Diagnosis present

## 2023-06-29 DIAGNOSIS — J9601 Acute respiratory failure with hypoxia: Secondary | ICD-10-CM | POA: Diagnosis not present

## 2023-06-29 DIAGNOSIS — D61818 Other pancytopenia: Secondary | ICD-10-CM | POA: Diagnosis present

## 2023-06-29 DIAGNOSIS — R600 Localized edema: Secondary | ICD-10-CM | POA: Diagnosis present

## 2023-06-29 DIAGNOSIS — G3109 Other frontotemporal dementia: Secondary | ICD-10-CM | POA: Diagnosis present

## 2023-06-29 DIAGNOSIS — E440 Moderate protein-calorie malnutrition: Secondary | ICD-10-CM | POA: Diagnosis present

## 2023-06-29 DIAGNOSIS — T426X5A Adverse effect of other antiepileptic and sedative-hypnotic drugs, initial encounter: Secondary | ICD-10-CM | POA: Diagnosis present

## 2023-06-29 DIAGNOSIS — J9621 Acute and chronic respiratory failure with hypoxia: Secondary | ICD-10-CM | POA: Diagnosis not present

## 2023-06-29 DIAGNOSIS — I700 Atherosclerosis of aorta: Secondary | ICD-10-CM | POA: Diagnosis present

## 2023-06-29 DIAGNOSIS — R54 Age-related physical debility: Secondary | ICD-10-CM | POA: Diagnosis present

## 2023-06-29 DIAGNOSIS — Z8616 Personal history of COVID-19: Secondary | ICD-10-CM

## 2023-06-29 DIAGNOSIS — E2740 Unspecified adrenocortical insufficiency: Secondary | ICD-10-CM | POA: Diagnosis present

## 2023-06-29 DIAGNOSIS — M47816 Spondylosis without myelopathy or radiculopathy, lumbar region: Secondary | ICD-10-CM | POA: Diagnosis present

## 2023-06-29 DIAGNOSIS — A419 Sepsis, unspecified organism: Secondary | ICD-10-CM | POA: Diagnosis not present

## 2023-06-29 DIAGNOSIS — E039 Hypothyroidism, unspecified: Secondary | ICD-10-CM | POA: Diagnosis present

## 2023-06-29 DIAGNOSIS — I462 Cardiac arrest due to underlying cardiac condition: Secondary | ICD-10-CM | POA: Diagnosis not present

## 2023-06-29 DIAGNOSIS — R1312 Dysphagia, oropharyngeal phase: Secondary | ICD-10-CM | POA: Diagnosis not present

## 2023-06-29 DIAGNOSIS — F0390 Unspecified dementia without behavioral disturbance: Secondary | ICD-10-CM | POA: Diagnosis present

## 2023-06-29 DIAGNOSIS — M7989 Other specified soft tissue disorders: Secondary | ICD-10-CM | POA: Diagnosis present

## 2023-06-29 DIAGNOSIS — R6521 Severe sepsis with septic shock: Secondary | ICD-10-CM | POA: Diagnosis present

## 2023-06-29 DIAGNOSIS — Z8603 Personal history of neoplasm of uncertain behavior: Secondary | ICD-10-CM

## 2023-06-29 DIAGNOSIS — Z87440 Personal history of urinary (tract) infections: Secondary | ICD-10-CM

## 2023-06-29 DIAGNOSIS — J90 Pleural effusion, not elsewhere classified: Secondary | ICD-10-CM | POA: Diagnosis not present

## 2023-06-29 DIAGNOSIS — G40909 Epilepsy, unspecified, not intractable, without status epilepticus: Secondary | ICD-10-CM

## 2023-06-29 DIAGNOSIS — Z853 Personal history of malignant neoplasm of breast: Secondary | ICD-10-CM

## 2023-06-29 DIAGNOSIS — Z8249 Family history of ischemic heart disease and other diseases of the circulatory system: Secondary | ICD-10-CM

## 2023-06-29 DIAGNOSIS — R4182 Altered mental status, unspecified: Secondary | ICD-10-CM | POA: Diagnosis not present

## 2023-06-29 DIAGNOSIS — Z79890 Hormone replacement therapy (postmenopausal): Secondary | ICD-10-CM

## 2023-06-29 DIAGNOSIS — Z96642 Presence of left artificial hip joint: Secondary | ICD-10-CM | POA: Diagnosis present

## 2023-06-29 DIAGNOSIS — R9431 Abnormal electrocardiogram [ECG] [EKG]: Secondary | ICD-10-CM | POA: Diagnosis not present

## 2023-06-29 DIAGNOSIS — R0602 Shortness of breath: Secondary | ICD-10-CM | POA: Diagnosis not present

## 2023-06-29 DIAGNOSIS — J9809 Other diseases of bronchus, not elsewhere classified: Secondary | ICD-10-CM | POA: Diagnosis not present

## 2023-06-29 DIAGNOSIS — J9602 Acute respiratory failure with hypercapnia: Secondary | ICD-10-CM | POA: Diagnosis not present

## 2023-06-29 DIAGNOSIS — J690 Pneumonitis due to inhalation of food and vomit: Secondary | ICD-10-CM | POA: Insufficient documentation

## 2023-06-29 DIAGNOSIS — Z7401 Bed confinement status: Secondary | ICD-10-CM

## 2023-06-29 DIAGNOSIS — G231 Progressive supranuclear ophthalmoplegia [Steele-Richardson-Olszewski]: Secondary | ICD-10-CM | POA: Diagnosis present

## 2023-06-29 DIAGNOSIS — R0689 Other abnormalities of breathing: Secondary | ICD-10-CM | POA: Diagnosis not present

## 2023-06-29 DIAGNOSIS — J918 Pleural effusion in other conditions classified elsewhere: Secondary | ICD-10-CM | POA: Diagnosis present

## 2023-06-29 DIAGNOSIS — R0902 Hypoxemia: Secondary | ICD-10-CM | POA: Diagnosis not present

## 2023-06-29 DIAGNOSIS — Z800 Family history of malignant neoplasm of digestive organs: Secondary | ICD-10-CM

## 2023-06-29 DIAGNOSIS — Z4682 Encounter for fitting and adjustment of non-vascular catheter: Secondary | ICD-10-CM | POA: Diagnosis not present

## 2023-06-29 DIAGNOSIS — I471 Supraventricular tachycardia, unspecified: Secondary | ICD-10-CM | POA: Diagnosis not present

## 2023-06-29 DIAGNOSIS — R609 Edema, unspecified: Secondary | ICD-10-CM | POA: Diagnosis not present

## 2023-06-29 DIAGNOSIS — R601 Generalized edema: Secondary | ICD-10-CM

## 2023-06-29 DIAGNOSIS — Z7189 Other specified counseling: Secondary | ICD-10-CM | POA: Diagnosis not present

## 2023-06-29 DIAGNOSIS — I4430 Unspecified atrioventricular block: Secondary | ICD-10-CM | POA: Diagnosis not present

## 2023-06-29 DIAGNOSIS — F0280 Dementia in other diseases classified elsewhere without behavioral disturbance: Secondary | ICD-10-CM | POA: Diagnosis present

## 2023-06-29 DIAGNOSIS — E876 Hypokalemia: Secondary | ICD-10-CM | POA: Diagnosis not present

## 2023-06-29 DIAGNOSIS — Z6833 Body mass index (BMI) 33.0-33.9, adult: Secondary | ICD-10-CM

## 2023-06-29 DIAGNOSIS — E1165 Type 2 diabetes mellitus with hyperglycemia: Secondary | ICD-10-CM | POA: Diagnosis present

## 2023-06-29 DIAGNOSIS — J9811 Atelectasis: Secondary | ICD-10-CM | POA: Diagnosis not present

## 2023-06-29 DIAGNOSIS — J0181 Other acute recurrent sinusitis: Secondary | ICD-10-CM | POA: Diagnosis present

## 2023-06-29 DIAGNOSIS — Z8669 Personal history of other diseases of the nervous system and sense organs: Secondary | ICD-10-CM

## 2023-06-29 DIAGNOSIS — R471 Dysarthria and anarthria: Secondary | ICD-10-CM | POA: Diagnosis not present

## 2023-06-29 DIAGNOSIS — E663 Overweight: Secondary | ICD-10-CM | POA: Diagnosis present

## 2023-06-29 DIAGNOSIS — Z794 Long term (current) use of insulin: Secondary | ICD-10-CM

## 2023-06-29 DIAGNOSIS — R918 Other nonspecific abnormal finding of lung field: Secondary | ICD-10-CM | POA: Diagnosis not present

## 2023-06-29 DIAGNOSIS — F02C0 Dementia in other diseases classified elsewhere, severe, without behavioral disturbance, psychotic disturbance, mood disturbance, and anxiety: Secondary | ICD-10-CM | POA: Diagnosis not present

## 2023-06-29 LAB — URINALYSIS, W/ REFLEX TO CULTURE (INFECTION SUSPECTED)
Bacteria, UA: NONE SEEN
Bilirubin Urine: NEGATIVE
Glucose, UA: NEGATIVE mg/dL
Hgb urine dipstick: NEGATIVE
Ketones, ur: NEGATIVE mg/dL
Leukocytes,Ua: NEGATIVE
Nitrite: NEGATIVE
Protein, ur: NEGATIVE mg/dL
Specific Gravity, Urine: 1.004 — ABNORMAL LOW (ref 1.005–1.030)
pH: 7 (ref 5.0–8.0)

## 2023-06-29 LAB — I-STAT ARTERIAL BLOOD GAS, ED
Acid-Base Excess: 12 mmol/L — ABNORMAL HIGH (ref 0.0–2.0)
Bicarbonate: 39.2 mmol/L — ABNORMAL HIGH (ref 20.0–28.0)
Calcium, Ion: 1.25 mmol/L (ref 1.15–1.40)
HCT: 29 % — ABNORMAL LOW (ref 36.0–46.0)
Hemoglobin: 9.9 g/dL — ABNORMAL LOW (ref 12.0–15.0)
O2 Saturation: 91 %
Patient temperature: 98.6
Potassium: 4 mmol/L (ref 3.5–5.1)
Sodium: 128 mmol/L — ABNORMAL LOW (ref 135–145)
TCO2: 41 mmol/L — ABNORMAL HIGH (ref 22–32)
pCO2 arterial: 64.5 mmHg — ABNORMAL HIGH (ref 32–48)
pH, Arterial: 7.392 (ref 7.35–7.45)
pO2, Arterial: 63 mmHg — ABNORMAL LOW (ref 83–108)

## 2023-06-29 LAB — PROTIME-INR
INR: 1 (ref 0.8–1.2)
Prothrombin Time: 13.3 seconds (ref 11.4–15.2)

## 2023-06-29 LAB — CBC WITH DIFFERENTIAL/PLATELET
Abs Immature Granulocytes: 0.03 10*3/uL (ref 0.00–0.07)
Basophils Absolute: 0 10*3/uL (ref 0.0–0.1)
Basophils Relative: 0 %
Eosinophils Absolute: 0 10*3/uL (ref 0.0–0.5)
Eosinophils Relative: 1 %
HCT: 32.5 % — ABNORMAL LOW (ref 36.0–46.0)
Hemoglobin: 10.9 g/dL — ABNORMAL LOW (ref 12.0–15.0)
Immature Granulocytes: 1 %
Lymphocytes Relative: 35 %
Lymphs Abs: 1 10*3/uL (ref 0.7–4.0)
MCH: 32 pg (ref 26.0–34.0)
MCHC: 33.5 g/dL (ref 30.0–36.0)
MCV: 95.3 fL (ref 80.0–100.0)
Monocytes Absolute: 0.2 10*3/uL (ref 0.1–1.0)
Monocytes Relative: 6 %
Neutro Abs: 1.6 10*3/uL — ABNORMAL LOW (ref 1.7–7.7)
Neutrophils Relative %: 57 %
Platelets: 63 10*3/uL — ABNORMAL LOW (ref 150–400)
RBC: 3.41 MIL/uL — ABNORMAL LOW (ref 3.87–5.11)
RDW: 16.4 % — ABNORMAL HIGH (ref 11.5–15.5)
WBC: 2.7 10*3/uL — ABNORMAL LOW (ref 4.0–10.5)
nRBC: 0 % (ref 0.0–0.2)

## 2023-06-29 LAB — COMPREHENSIVE METABOLIC PANEL
ALT: 14 U/L (ref 0–44)
AST: 21 U/L (ref 15–41)
Albumin: 2.4 g/dL — ABNORMAL LOW (ref 3.5–5.0)
Alkaline Phosphatase: 69 U/L (ref 38–126)
Anion gap: 8 (ref 5–15)
BUN: 7 mg/dL — ABNORMAL LOW (ref 8–23)
CO2: 36 mmol/L — ABNORMAL HIGH (ref 22–32)
Calcium: 9 mg/dL (ref 8.9–10.3)
Chloride: 87 mmol/L — ABNORMAL LOW (ref 98–111)
Creatinine, Ser: 0.83 mg/dL (ref 0.44–1.00)
GFR, Estimated: >60 mL/min (ref >60)
Glucose, Bld: 74 mg/dL (ref 70–99)
Potassium: 4.2 mmol/L (ref 3.5–5.1)
Sodium: 131 mmol/L — ABNORMAL LOW (ref 135–145)
Total Bilirubin: 0.5 mg/dL (ref 0.3–1.2)
Total Protein: 5.9 g/dL — ABNORMAL LOW (ref 6.5–8.1)

## 2023-06-29 LAB — HEMOGLOBIN A1C
Hgb A1c MFr Bld: 5 % (ref 4.8–5.6)
Mean Plasma Glucose: 96.8 mg/dL

## 2023-06-29 LAB — LACTIC ACID, PLASMA: Lactic Acid, Venous: 0.8 mmol/L (ref 0.5–1.9)

## 2023-06-29 LAB — BRAIN NATRIURETIC PEPTIDE: B Natriuretic Peptide: 198.6 pg/mL — ABNORMAL HIGH (ref 0.0–100.0)

## 2023-06-29 LAB — AMMONIA: Ammonia: 41 umol/L — ABNORMAL HIGH (ref 9–35)

## 2023-06-29 LAB — APTT: aPTT: 23 seconds — ABNORMAL LOW (ref 24–36)

## 2023-06-29 MED ORDER — SODIUM CHLORIDE 0.9 % IV SOLN
1.0000 g | Freq: Once | INTRAVENOUS | Status: AC
  Administered 2023-06-29: 1 g via INTRAVENOUS
  Filled 2023-06-29: qty 10

## 2023-06-29 MED ORDER — INSULIN ASPART 100 UNIT/ML IJ SOLN: 0.0000 [IU] | Freq: Every day | INTRAMUSCULAR | Status: DC

## 2023-06-29 MED ORDER — SODIUM CHLORIDE 0.9 % IV BOLUS (SEPSIS)
500.0000 mL | Freq: Once | INTRAVENOUS | Status: AC
  Administered 2023-06-29: 500 mL via INTRAVENOUS

## 2023-06-29 MED ORDER — FUROSEMIDE 10 MG/ML IJ SOLN
8.0000 mg/h | INTRAVENOUS | Status: DC
  Administered 2023-06-29: 8 mg/h via INTRAVENOUS
  Filled 2023-06-29: qty 20

## 2023-06-29 MED ORDER — SODIUM CHLORIDE 0.9 % IV SOLN
500.0000 mg | INTRAVENOUS | Status: DC
  Administered 2023-06-29 – 2023-06-30 (×2): 500 mg via INTRAVENOUS
  Filled 2023-06-29 (×2): qty 5

## 2023-06-29 MED ORDER — IOHEXOL 350 MG/ML SOLN
75.0000 mL | Freq: Once | INTRAVENOUS | Status: AC | PRN
  Administered 2023-06-29: 75 mL via INTRAVENOUS

## 2023-06-29 MED ORDER — SODIUM CHLORIDE 0.9 % IV SOLN
3.0000 g | Freq: Four times a day (QID) | INTRAVENOUS | Status: AC
  Administered 2023-06-29 – 2023-07-03 (×17): 3 g via INTRAVENOUS
  Filled 2023-06-29 (×17): qty 8

## 2023-06-29 MED ORDER — INSULIN ASPART 100 UNIT/ML IJ SOLN: 0.0000 [IU] | Freq: Three times a day (TID) | INTRAMUSCULAR | Status: DC

## 2023-06-29 MED ORDER — LACTATED RINGERS IV SOLN: INTRAVENOUS | Status: DC

## 2023-06-29 NOTE — Assessment & Plan Note (Signed)
This is told verbally to me by the patient as well as it noted in the chart.  Patient is not on any AICD or pacemaker therapy.  Continue with propafenone.

## 2023-06-29 NOTE — H&P (Addendum)
History and Physical    Patient: [name]:[id number] DOB: 05/17/1956 DOA: 06/29/2023 DOS: the patient was seen and examined on 06/29/2023 PCP: Jackelyn Poling, DO  Patient coming from: Home  Chief Complaint:  Chief Complaint  Patient presents with   Altered Mental Status   HPI: [name] is a 67 y.o. female with medical history significant of frontotemporal dementia.  As per the record reviewed on December 10, 2022 by Dr. Arbutus Leas.  However husband claims to me that the patient also additionally has a diagnosis of progressive supranuclear palsy.  Patient apparently was last seen by neurology in December of last year.  At the time patient was ambulatory with a walker.  Unfortunately patient has not been able to make neurology appointment since then and patient has had hospitalizations that have interrupted neurology appointment.  And patient has had a marked functional decline.  Patient is basically now bedbound, has to be fed by another person.  And has developed rigidity in all 4 extremities.  And is markedly limited in her speech.  Over the last several weeks, patient has been noted to be coughing especially with food provision.  Or after food provision.  Patient is chronically on 1 L/min of supplementary oxygen.  However for the last 48 hours, patient has noted to have requirement for 2 L/min of supplementary oxygen based on pulse ox and monitoring that has been done at home.  Patient has continued to cough with a wet cough.  Not able to expectorate.  There is no complaint of chest pain from patient no tachypnea no fever.  Patient has chronic leg swelling.  However this has gotten progressively worse in the last 1 week.  Patient is brought in by husband, who is the primary historian, for the hypoxia as described above.  Patient offers no complaints at this time, wakes up to verbal stimulation, makes brief eye contact and pleasantries. Review of Systems: As mentioned in the history of present  illness. All other systems reviewed and are negative.  Limited review of system as patient is not contributing to HPI/history much. Past Medical History:  Diagnosis Date   Altered mental status 01/18/2023   Arthritis lower back/hands   Atypical chest pain 04/15/2017   Atypical chest pain   Brain injury (HCC)    Cancer (HCC) breast tumor removed   COVID-19 02/02/2023   Dementia (HCC)    PSD-FTD   Disorientation 01/18/2023   Hip fracture (HCC) 09/06/2019   History of epilepsy 02/02/2023   Leg fracture    Seizure disorder (HCC)    Seizures (HCC)    Sepsis (HCC) 01/11/2023   VT (ventricular tachycardia) (HCC) 04/29/2017   Past Surgical History:  Procedure Laterality Date   BREAST SURGERY     FACIAL FRACTURE SURGERY  orbital bone repair   LEFT HEART CATH AND CORONARY ANGIOGRAPHY N/A 04/30/2017   Procedure: Left Heart Cath and Coronary Angiography;  Surgeon: Marykay Lex, MD;  Location: [facility] INVASIVE CV LAB;  Service: Cardiovascular;  Laterality: N/A;   TOTAL HIP ARTHROPLASTY Left 09/07/2019   Procedure: TOTAL HIP ARTHROPLASTY ANTERIOR APPROACH;  Surgeon: Samson Frederic, MD;  Location: [facility] ORS;  Service: Orthopedics;  Laterality: Left;   Social History:  reports that she has never smoked. She has never used smokeless tobacco. She reports that she does not drink alcohol and does not use drugs.  No Known Allergies  Family History  Problem Relation Age of Onset   Heart failure Mother  Heart disease Mother    Pancreatic cancer Father    Cancer Brother     Prior to Admission medications   Medication Sig Start Date End Date Taking? Authorizing Provider  acetaminophen (TYLENOL) 325 MG tablet Take 2 tablets (650 mg total) by mouth every 6 (six) hours as needed for mild pain, fever or headache. 03/13/23   Lonia Blood, MD  Calcium Carb-Cholecalciferol (CALCIUM + VITAMIN D3 PO) Take 1 tablet by mouth daily.    [provider]  Cholecalciferol (VITAMIN D-3 PO) Take 1  capsule by mouth daily.    [provider]  cloBAZam (ONFI) 10 MG tablet Take 10-15 mg by mouth See admin instructions. 10 mg every morning, 15 mg every evening    [provider]  divalproex (DEPAKOTE ER) 500 MG 24 hr tablet Take 1,000 mg by mouth at bedtime.     [provider]  lansoprazole (PREVACID) 30 MG capsule Take 30 mg by mouth 2 (two) times daily. 06/10/22   [provider]  levothyroxine (SYNTHROID) 50 MCG tablet Take 50 mcg by mouth daily before breakfast.    [provider]  Multiple Vitamins-Minerals (MULTIVITAMIN WOMEN 50+) TABS Take 1 tablet by mouth daily.    [provider]  Pediatric Multivitamins-Iron (CHILDRENS MULTI VITAMINS/IRON PO) Take 1 tablet by mouth daily.    [provider]  polyethylene glycol (MIRALAX / GLYCOLAX) packet Take 8.5 g by mouth daily.    [provider]  propafenone (RYTHMOL SR) 225 MG 12 hr capsule TAKE 1 CAPSULE BY MOUTH TWICE A DAY 05/10/23   Runell Gess, MD  trimethoprim (TRIMPEX) 100 MG tablet Take 1 tablet (100 mg total) by mouth daily. 03/18/23   Lonia Blood, MD    Physical Exam: Vitals:   06/29/23 2100 06/29/23 2130 06/29/23 2157 06/29/23 2157  BP: 110/79 100/65    Pulse: (!) 56 (!) 57    Resp: (!) 9 12    Temp:      TempSrc:      SpO2: 97% 97%  97%  Weight:   93 kg   Height:   5\' 6"  (1.676 m)   Patient temperature in the ER was 34.2.  Placed on air warmer. General: Patient was sleeping for most of the encounters, arousable with verbal stimulation.  Makes some pleasantries but then goes back to sleep.  Is not contributing much to history Respiratory exam: Bilateral intravesicular, limited due to patient recumbent status Cardiovascular exam S1-S2 normal Abdomen soft nontender Extremities warm warm with marked edema in all 4 extremities Patient has a passive resistance to flexion of bilateral elbows.  And similarly lower extremities.  Does not follow  directions with any of the 4 extremities Data Reviewed:  Labs on Admission:  Results for orders placed or performed during the hospital encounter of 06/29/23 (from the past 24 hour(s))  I-Stat arterial blood gas, ED [facility] ED, [facility], [facility])     Status: Abnormal   Collection Time: 06/29/23  3:00 PM  Result Value Ref Range   pH, Arterial 7.392 7.35 - 7.45   pCO2 arterial 64.5 (H) 32 - 48 mmHg   pO2, Arterial 63 (L) 83 - 108 mmHg   Bicarbonate 39.2 (H) 20.0 - 28.0 mmol/L   TCO2 41 (H) 22 - 32 mmol/L   O2 Saturation 91 %   Acid-Base Excess 12.0 (H) 0.0 - 2.0 mmol/L   Sodium 128 (L) 135 - 145 mmol/L   Potassium 4.0 3.5 - 5.1 mmol/L  Calcium, Ion 1.25 1.15 - 1.40 mmol/L   HCT 29.0 (L) 36.0 - 46.0 %   Hemoglobin 9.9 (L) 12.0 - 15.0 g/dL   Patient temperature 16.1 F    Collection site RADIAL, ALLEN'S TEST ACCEPTABLE    Drawn by RT    Sample type ARTERIAL   Comprehensive metabolic panel     Status: Abnormal   Collection Time: 06/29/23  3:59 PM  Result Value Ref Range   Sodium 131 (L) 135 - 145 mmol/L   Potassium 4.2 3.5 - 5.1 mmol/L   Chloride 87 (L) 98 - 111 mmol/L   CO2 36 (H) 22 - 32 mmol/L   Glucose, Bld 74 70 - 99 mg/dL   BUN 7 (L) 8 - 23 mg/dL   Creatinine, Ser 0.96 0.44 - 1.00 mg/dL   Calcium 9.0 8.9 - 04.5 mg/dL   Total Protein 5.9 (L) 6.5 - 8.1 g/dL   Albumin 2.4 (L) 3.5 - 5.0 g/dL   AST 21 15 - 41 U/L   ALT 14 0 - 44 U/L   Alkaline Phosphatase 69 38 - 126 U/L   Total Bilirubin 0.5 0.3 - 1.2 mg/dL   GFR, Estimated >40 >98 mL/min   Anion gap 8 5 - 15  CBC with Differential     Status: Abnormal   Collection Time: 06/29/23  3:59 PM  Result Value Ref Range   WBC 2.7 (L) 4.0 - 10.5 K/uL   RBC 3.41 (L) 3.87 - 5.11 MIL/uL   Hemoglobin 10.9 (L) 12.0 - 15.0 g/dL   HCT 11.9 (L) 14.7 - 82.9 %   MCV 95.3 80.0 - 100.0 fL   MCH 32.0 26.0 - 34.0 pg   MCHC 33.5 30.0 - 36.0 g/dL   RDW 56.2 (H) 13.0 - 86.5 %   Platelets 63 (L) 150 - 400 K/uL   nRBC 0.0 0.0 - 0.2 %   Neutrophils  Relative % 57 %   Neutro Abs 1.6 (L) 1.7 - 7.7 K/uL   Lymphocytes Relative 35 %   Lymphs Abs 1.0 0.7 - 4.0 K/uL   Monocytes Relative 6 %   Monocytes Absolute 0.2 0.1 - 1.0 K/uL   Eosinophils Relative 1 %   Eosinophils Absolute 0.0 0.0 - 0.5 K/uL   Basophils Relative 0 %   Basophils Absolute 0.0 0.0 - 0.1 K/uL   Immature Granulocytes 1 %   Abs Immature Granulocytes 0.03 0.00 - 0.07 K/uL  Protime-INR     Status: None   Collection Time: 06/29/23  3:59 PM  Result Value Ref Range   Prothrombin Time 13.3 11.4 - 15.2 seconds   INR 1.0 0.8 - 1.2  APTT     Status: Abnormal   Collection Time: 06/29/23  3:59 PM  Result Value Ref Range   aPTT 23 (L) 24 - 36 seconds  Ammonia     Status: Abnormal   Collection Time: 06/29/23  4:37 PM  Result Value Ref Range   Ammonia 41 (H) 9 - 35 umol/L  Urinalysis, w/ Reflex to Culture (Infection Suspected) -Urine, Catheterized     Status: Abnormal   Collection Time: 06/29/23  7:15 PM  Result Value Ref Range   Specimen Source URINE, CATHETERIZED    Color, Urine YELLOW YELLOW   APPearance CLEAR CLEAR   Specific Gravity, Urine 1.004 (L) 1.005 - 1.030   pH 7.0 5.0 - 8.0   Glucose, UA NEGATIVE NEGATIVE mg/dL   Hgb urine dipstick NEGATIVE NEGATIVE   Bilirubin Urine NEGATIVE NEGATIVE  Ketones, ur NEGATIVE NEGATIVE mg/dL   Protein, ur NEGATIVE NEGATIVE mg/dL   Nitrite NEGATIVE NEGATIVE   Leukocytes,Ua NEGATIVE NEGATIVE   RBC / HPF 0-5 0 - 5 RBC/hpf   WBC, UA 0-5 0 - 5 WBC/hpf   Bacteria, UA NONE SEEN NONE SEEN   Squamous Epithelial / HPF 0-5 0 - 5 /HPF  Lactic acid, plasma     Status: None   Collection Time: 06/29/23  8:21 PM  Result Value Ref Range   Lactic Acid, Venous 0.8 0.5 - 1.9 mmol/L   Basic Metabolic Panel: Recent Labs  Lab 06/29/23 1500 06/29/23 1559  NA 128* 131*  K 4.0 4.2  CL  --  87*  CO2  --  36*  GLUCOSE  --  74  BUN  --  7*  CREATININE  --  0.83  CALCIUM  --  9.0   Liver Function Tests: Recent Labs  Lab 06/29/23 1559   AST 21  ALT 14  ALKPHOS 69  BILITOT 0.5  PROT 5.9*  ALBUMIN 2.4*   No results for input(s): "LIPASE", "AMYLASE" in the last 168 hours. Recent Labs  Lab 06/29/23 1637  AMMONIA 41*   CBC: Recent Labs  Lab 06/29/23 1500 06/29/23 1559  WBC  --  2.7*  NEUTROABS  --  1.6*  HGB 9.9* 10.9*  HCT 29.0* 32.5*  MCV  --  95.3  PLT  --  63*   Cardiac Enzymes: No results for input(s): "CKTOTAL", "CKMB", "CKMBINDEX", "TROPONINIHS" in the last 168 hours.  BNP (last 3 results) No results for input(s): "PROBNP" in the last 8760 hours. CBG: No results for input(s): "GLUCAP" in the last 168 hours.  Radiological Exams on Admission:  CT Angio Chest PE W/Cm &/Or Wo Cm  Result Date: 06/29/2023 CLINICAL DATA:  Concern for pulmonary embolism. EXAM: CT ANGIOGRAPHY CHEST WITH CONTRAST TECHNIQUE: Multidetector CT imaging of the chest was performed using the standard protocol during bolus administration of intravenous contrast. Multiplanar CT image reconstructions and MIPs were obtained to evaluate the vascular anatomy. RADIATION DOSE REDUCTION: This exam was performed according to the departmental dose-optimization program which includes automated exposure control, adjustment of the mA and/or kV according to patient size and/or use of iterative reconstruction technique. CONTRAST:  75mL OMNIPAQUE IOHEXOL 350 MG/ML SOLN COMPARISON:  Chest CT dated 03/12/2023. FINDINGS: Evaluation is limited due to respiratory motion and streak artifact caused by patient's arms. Cardiovascular: There is no cardiomegaly or pericardial effusion. Mild atherosclerotic calcification of the thoracic aorta. No aneurysmal dilatation. Evaluation of the pulmonary arteries is limited due to factors above. No pulmonary artery embolus identified. Mediastinum/Nodes: Subcarinal lymph node measures 16 mm short axis. No obvious hilar adenopathy. Evaluation of the hilar lymph nodes however is limited due to consolidative changes of the lungs.  The esophagus is grossly unremarkable. No mediastinal fluid collection. Lungs/Pleura: Small bilateral pleural effusions, significantly increased since the prior CT. There is complete consolidation of the left lower lobe with partial consolidation of the right lower lobe which may represent atelectasis or pneumonia. Underlying mass is not excluded clinical correlation and follow-up to resolution recommended. There is mosaic attenuation of the aerated lung which may represent air trapping and related to underlying small airways versus small vessel disease. There is no pneumothorax. High-grade narrowing of the bilateral lower lobe bronchi, possibly related to mucous impaction. Aspiration is not excluded. Upper Abdomen: No acute abnormality. Musculoskeletal: Osteopenia with degenerative changes of the spine. Several old thoracic spine compression fractures. No acute osseous pathology.  Review of the MIP images confirms the above findings. IMPRESSION: 1. No CT evidence of pulmonary embolism. 2. Small bilateral pleural effusions, significantly increased since the prior CT. 3. Complete consolidation of the left lower lobe with partial consolidation of the right lower lobe which may represent atelectasis or pneumonia. Underlying mass is not excluded clinical correlation and follow-up to resolution recommended. 4.  Aortic Atherosclerosis (ICD10-I70.0). Electronically Signed   By: Elgie Collard M.D.   On: 06/29/2023 19:46   CT Head Wo Contrast  Result Date: 06/29/2023 CLINICAL DATA:  Mental status change, unknown cause EXAM: CT HEAD WITHOUT CONTRAST TECHNIQUE: Contiguous axial images were obtained from the base of the skull through the vertex without intravenous contrast. RADIATION DOSE REDUCTION: This exam was performed according to the departmental dose-optimization program which includes automated exposure control, adjustment of the mA and/or kV according to patient size and/or use of iterative reconstruction  technique. COMPARISON:  None Available. FINDINGS: Brain: There is atrophy and chronic small vessel disease changes. No acute intracranial abnormality. Specifically, no hemorrhage, hydrocephalus, mass lesion, acute infarction, or significant intracranial injury. Vascular: No hyperdense vessel or unexpected calcification. Skull: No acute calvarial abnormality. Sinuses/Orbits: Mucosal thickening within the left maxillary sinus, ethmoid air cells and bilateral frontal sinuses. Air-fluid level in the sphenoid sinuses. Other: None IMPRESSION: Atrophy, chronic microvascular disease. No acute intracranial abnormality. Acute on chronic sinusitis. Electronically Signed   By: Charlett Nose M.D.   On: 06/29/2023 19:41   DG Chest Port 1 View  Result Date: 06/29/2023 CLINICAL DATA:  Questionable sepsis EXAM: PORTABLE CHEST 1 VIEW COMPARISON:  X-ray and CT 03/12/2023 FINDINGS: Increasing bilateral small to moderate pleural effusions with adjacent opacities. No pneumothorax. Film is rotated to the left. Enlarged cardiopericardial silhouette. No edema. Degenerative changes of the spine. IMPRESSION: Developing small to moderate pleural effusions. Adjacent lung opacities. Recommend follow-up Electronically Signed   By: Karen Kays M.D.   On: 06/29/2023 16:28    EKG: Independently reviewed. Artifact. Suspet NSR   Assessment and Plan: Anasarca Etiology under workup.  Check troponin, check BNP, check urine protein creatinine ratio.  LFTs within normal limits.  Treat the patient with Lasix infusion, monitor weight intake and output.  Monitor electrolytes.  I will check a magnesium in the morning  Aspiration pneumonia (HCC) Treated with sepsis evident as low white count, hypothermia.  Continue with warming blanket.  S/p ceftriaxone.  Continue with Unasyn.  CAT scan reviewed, very low concern for atypical infections.  Patient has associated pleural effusions.  Which are felt to be also due to anasarca.  4 of patient will not  get additional fluid.  Pancytopenia (HCC) This is new/worse than before.  I will check B12 folate and anemia panel and electrophoresis.  I will send a type and screen.  Trend CBC, hold Lovenox if thrombocytopenia worsens  Acute on chronic respiratory failure with hypoxia (HCC) This is mixed with the hypercapnia as well as elevated AA gradient.  Likely due to both patient neuromuscular/chest wall disability as well as pneumonia as well as pleural effusion seen on CAT scan.  Continue with oxygen as needed. Now on 2lpm oxygen from 1 chronically.  Failure to thrive in adult Likely due to aspiration, at this time I will hold patient's diet.  I have requested swallow evaluation.  Nutrition evaluation.  Dementia without behavioral disturbance [facility]) Patient has had marked progression of her symptoms compared to the last neurology visit.  Patient now I believe is aspirating, is not able to get  back up, not hold meaningful conversations.  Risk of progression of patient condition in terms of unforeseen complications is high.  I did bring up this fact with the patient husband and suggested that in view of patient's decline in functional status however advanced directives should be reviewed.  I will put in a palliative care evaluation request for the morning.  I have sent a staff message to Dr. Russella Dar, [place] for the morning to have her evaluate the patient for progressive supranuclear palsy.  Seizure disorder (HCC) Well-controlled, continue with valproic acid, this is a chronic diagnosis  History of VT (ventricular tachycardia) (HCC) This is told verbally to me by the patient as well as it noted in the chart.  Patient is not on any AICD or pacemaker therapy.  Continue with propafenone.      Advance Care Planning:   Code Status: Prior full code MOST scanned in chart. I broached the topic with patient husband to revalaute and reaffirm if needed MOST given change in patient functional status.  Consults:  neurology staff message sent to Dr. Russella Dar . Please follow up in AM  Family Communication: husband at bedside.  Severity of Illness: The appropriate patient status for this patient is INPATIENT. Inpatient status is judged to be reasonable and necessary in order to provide the required intensity of service to ensure the patient's safety. The patient's presenting symptoms, physical exam findings, and initial radiographic and laboratory data in the context of their chronic comorbidities is felt to place them at high risk for further clinical deterioration. Furthermore, it is not anticipated that the patient will be medically stable for discharge from the hospital within 2 midnights of admission.   * I certify that at the point of admission it is my clinical judgment that the patient will require inpatient hospital care spanning beyond 2 midnights from the point of admission due to high intensity of service, high risk for further deterioration and high frequency of surveillance required.*  Author: Nolberto Hanlon, MD 06/29/2023 10:01 PM  For on call review [email or web address].

## 2023-06-29 NOTE — ED Notes (Addendum)
Was unable to get oral or axillary temp

## 2023-06-29 NOTE — Assessment & Plan Note (Signed)
Etiology under workup.  Check troponin, check BNP, check urine protein creatinine ratio.  LFTs within normal limits.  Treat the patient with Lasix infusion, monitor weight intake and output.  Monitor electrolytes.  I will check a magnesium in the morning

## 2023-06-29 NOTE — Sepsis Progress Note (Addendum)
Elink monitoring for the code sepsis protocol.  Blood cultures collected several  hours before code sepsis ordered.

## 2023-06-29 NOTE — Assessment & Plan Note (Signed)
Likely due to aspiration, at this time I will hold patient's diet.  I have requested swallow evaluation.  Nutrition evaluation.

## 2023-06-29 NOTE — ED Provider Notes (Signed)
[place] EMERGENCY DEPARTMENT AT [facility] Provider Note   CSN: [id number] Arrival date & time: 06/29/23  1413     History  Chief Complaint  Patient presents with   Altered Mental Status    [name] is a 67 y.o. female.  67 year old female brought in by EMS from home with husband and daughter at bedside.  Husband provides history, states that patient has been having problems with aspiration for the past several months, progressively worsening now with concern for worsening cough and O2 sats into the low 80s at times.  States her normal O2 sat is 88 to 92% on room air.  Reports that she is having worsening edema in her lower extremities and hands. Past medical history of seizures, breast cancer, dementia.          Home Medications Prior to Admission medications   Medication Sig Start Date End Date Taking? Authorizing Provider  acetaminophen (TYLENOL) 325 MG tablet Take 2 tablets (650 mg total) by mouth every 6 (six) hours as needed for mild pain, fever or headache. 03/13/23   Lonia Blood, MD  Calcium Carb-Cholecalciferol (CALCIUM + VITAMIN D3 PO) Take 1 tablet by mouth daily.    [provider]  Cholecalciferol (VITAMIN D-3 PO) Take 1 capsule by mouth daily.    [provider]  cloBAZam (ONFI) 10 MG tablet Take 10-15 mg by mouth See admin instructions. 10 mg every morning, 15 mg every evening    [provider]  divalproex (DEPAKOTE ER) 500 MG 24 hr tablet Take 1,000 mg by mouth at bedtime.     [provider]  lansoprazole (PREVACID) 30 MG capsule Take 30 mg by mouth 2 (two) times daily. 06/10/22   [provider]  levothyroxine (SYNTHROID) 50 MCG tablet Take 50 mcg by mouth daily before breakfast.    [provider]  Multiple Vitamins-Minerals (MULTIVITAMIN WOMEN 50+) TABS Take 1 tablet by mouth daily.    [provider]  Pediatric Multivitamins-Iron (CHILDRENS MULTI VITAMINS/IRON PO) Take 1  tablet by mouth daily.    [provider]  polyethylene glycol (MIRALAX / GLYCOLAX) packet Take 8.5 g by mouth daily.    [provider]  propafenone (RYTHMOL SR) 225 MG 12 hr capsule TAKE 1 CAPSULE BY MOUTH TWICE A DAY 05/10/23   Runell Gess, MD  trimethoprim (TRIMPEX) 100 MG tablet Take 1 tablet (100 mg total) by mouth daily. 03/18/23   Lonia Blood, MD      Allergies    Patient has no known allergies.    Review of Systems   Review of Systems Level 5 caveat for dementia Physical Exam Updated Vital Signs BP 100/65   Pulse (!) 57   Temp (!) 93.6 F (34.2 C) (Rectal)   Resp 12   Ht 5\' 6"  (1.676 m)   Wt 93 kg   SpO2 97%   BMI 33.09 kg/m  Physical Exam Vitals and nursing note reviewed.  Constitutional:      General: She is not in acute distress.    Appearance: She is well-developed and overweight. She is not diaphoretic.  HENT:     Head: Normocephalic and atraumatic.     Mouth/Throat:     Mouth: Mucous membranes are dry.  Cardiovascular:     Rate and Rhythm: Normal rate and regular rhythm.     Heart sounds: Normal heart sounds. No murmur heard. Pulmonary:     Effort: Pulmonary effort is normal.  Breath sounds: Examination of the right-lower field reveals decreased breath sounds and rhonchi. Examination of the left-lower field reveals decreased breath sounds and rhonchi. Decreased breath sounds and rhonchi present.  Abdominal:     Palpations: Abdomen is soft.     Tenderness: There is no abdominal tenderness.  Musculoskeletal:     Cervical back: Neck supple.     Right lower leg: Edema present.     Left lower leg: Edema present.  Skin:    General: Skin is warm and dry.     Findings: No erythema or rash.  Neurological:     Mental Status: She is easily aroused. Mental status is at baseline.     Comments: Follows simple commands such as open eyes, move fingers.     ED Results / Procedures / Treatments   Labs (all labs ordered are listed,  but only abnormal results are displayed) Labs Reviewed  COMPREHENSIVE METABOLIC PANEL - Abnormal; Notable for the following components:      Result Value   Sodium 131 (*)    Chloride 87 (*)    CO2 36 (*)    BUN 7 (*)    Total Protein 5.9 (*)    Albumin 2.4 (*)    All other components within normal limits  CBC WITH DIFFERENTIAL/PLATELET - Abnormal; Notable for the following components:   WBC 2.7 (*)    RBC 3.41 (*)    Hemoglobin 10.9 (*)    HCT 32.5 (*)    RDW 16.4 (*)    Platelets 63 (*)    Neutro Abs 1.6 (*)    All other components within normal limits  APTT - Abnormal; Notable for the following components:   aPTT 23 (*)    All other components within normal limits  URINALYSIS, W/ REFLEX TO CULTURE (INFECTION SUSPECTED) - Abnormal; Notable for the following components:   Specific Gravity, Urine 1.004 (*)    All other components within normal limits  AMMONIA - Abnormal; Notable for the following components:   Ammonia 41 (*)    All other components within normal limits  I-STAT ARTERIAL BLOOD GAS, ED - Abnormal; Notable for the following components:   pCO2 arterial 64.5 (*)    pO2, Arterial 63 (*)    Bicarbonate 39.2 (*)    TCO2 41 (*)    Acid-Base Excess 12.0 (*)    Sodium 128 (*)    HCT 29.0 (*)    Hemoglobin 9.9 (*)    All other components within normal limits  CULTURE, BLOOD (ROUTINE X 2)  CULTURE, BLOOD (ROUTINE X 2)  LACTIC ACID, PLASMA  PROTIME-INR  LACTIC ACID, PLASMA  PROTEIN / CREATININE RATIO, URINE  URINALYSIS, COMPLETE (UACMP) WITH MICROSCOPIC  BRAIN NATRIURETIC PEPTIDE  BASIC METABOLIC PANEL    EKG EKG Interpretation Date/Time:  Tuesday June 29 2023 20:55:49 EDT Ventricular Rate:  57 PR Interval:    QRS Duration:  158 QT Interval:  470 QTC Calculation: 458 R Axis:   34  Text Interpretation: Unknown rhythm secondary to baseline artifact Right bundle branch block Interpretation limited secondary to artifact Confirmed by Elayne Snare ([id number]) on  06/29/2023 9:31:34 PM  Radiology CT Angio Chest PE W/Cm &/Or Wo Cm  Result Date: 06/29/2023 CLINICAL DATA:  Concern for pulmonary embolism. EXAM: CT ANGIOGRAPHY CHEST WITH CONTRAST TECHNIQUE: Multidetector CT imaging of the chest was performed using the standard protocol during bolus administration of intravenous contrast. Multiplanar CT image reconstructions and MIPs were obtained to evaluate the vascular anatomy. RADIATION DOSE  REDUCTION: This exam was performed according to the departmental dose-optimization program which includes automated exposure control, adjustment of the mA and/or kV according to patient size and/or use of iterative reconstruction technique. CONTRAST:  75mL OMNIPAQUE IOHEXOL 350 MG/ML SOLN COMPARISON:  Chest CT dated 03/12/2023. FINDINGS: Evaluation is limited due to respiratory motion and streak artifact caused by patient's arms. Cardiovascular: There is no cardiomegaly or pericardial effusion. Mild atherosclerotic calcification of the thoracic aorta. No aneurysmal dilatation. Evaluation of the pulmonary arteries is limited due to factors above. No pulmonary artery embolus identified. Mediastinum/Nodes: Subcarinal lymph node measures 16 mm short axis. No obvious hilar adenopathy. Evaluation of the hilar lymph nodes however is limited due to consolidative changes of the lungs. The esophagus is grossly unremarkable. No mediastinal fluid collection. Lungs/Pleura: Small bilateral pleural effusions, significantly increased since the prior CT. There is complete consolidation of the left lower lobe with partial consolidation of the right lower lobe which may represent atelectasis or pneumonia. Underlying mass is not excluded clinical correlation and follow-up to resolution recommended. There is mosaic attenuation of the aerated lung which may represent air trapping and related to underlying small airways versus small vessel disease. There is no pneumothorax. High-grade narrowing of the bilateral  lower lobe bronchi, possibly related to mucous impaction. Aspiration is not excluded. Upper Abdomen: No acute abnormality. Musculoskeletal: Osteopenia with degenerative changes of the spine. Several old thoracic spine compression fractures. No acute osseous pathology. Review of the MIP images confirms the above findings. IMPRESSION: 1. No CT evidence of pulmonary embolism. 2. Small bilateral pleural effusions, significantly increased since the prior CT. 3. Complete consolidation of the left lower lobe with partial consolidation of the right lower lobe which may represent atelectasis or pneumonia. Underlying mass is not excluded clinical correlation and follow-up to resolution recommended. 4.  Aortic Atherosclerosis (ICD10-I70.0). Electronically Signed   By: Elgie Collard M.D.   On: 06/29/2023 19:46   CT Head Wo Contrast  Result Date: 06/29/2023 CLINICAL DATA:  Mental status change, unknown cause EXAM: CT HEAD WITHOUT CONTRAST TECHNIQUE: Contiguous axial images were obtained from the base of the skull through the vertex without intravenous contrast. RADIATION DOSE REDUCTION: This exam was performed according to the departmental dose-optimization program which includes automated exposure control, adjustment of the mA and/or kV according to patient size and/or use of iterative reconstruction technique. COMPARISON:  None Available. FINDINGS: Brain: There is atrophy and chronic small vessel disease changes. No acute intracranial abnormality. Specifically, no hemorrhage, hydrocephalus, mass lesion, acute infarction, or significant intracranial injury. Vascular: No hyperdense vessel or unexpected calcification. Skull: No acute calvarial abnormality. Sinuses/Orbits: Mucosal thickening within the left maxillary sinus, ethmoid air cells and bilateral frontal sinuses. Air-fluid level in the sphenoid sinuses. Other: None IMPRESSION: Atrophy, chronic microvascular disease. No acute intracranial abnormality. Acute on  chronic sinusitis. Electronically Signed   By: Charlett Nose M.D.   On: 06/29/2023 19:41   DG Chest Port 1 View  Result Date: 06/29/2023 CLINICAL DATA:  Questionable sepsis EXAM: PORTABLE CHEST 1 VIEW COMPARISON:  X-ray and CT 03/12/2023 FINDINGS: Increasing bilateral small to moderate pleural effusions with adjacent opacities. No pneumothorax. Film is rotated to the left. Enlarged cardiopericardial silhouette. No edema. Degenerative changes of the spine. IMPRESSION: Developing small to moderate pleural effusions. Adjacent lung opacities. Recommend follow-up Electronically Signed   By: Karen Kays M.D.   On: 06/29/2023 16:28    Procedures Procedures    Medications Ordered in ED Medications  lactated ringers infusion ( Intravenous New Bag/Given  06/29/23 2124)  azithromycin (ZITHROMAX) 500 mg in sodium chloride 0.9 % 250 mL IVPB (500 mg Intravenous New Bag/Given 06/29/23 2125)  furosemide (LASIX) 200 mg in dextrose 5 % 100 mL (2 mg/mL) infusion (has no administration in time range)  iohexol (OMNIPAQUE) 350 MG/ML injection 75 mL (75 mLs Intravenous Contrast Given 06/29/23 1935)  cefTRIAXone (ROCEPHIN) 1 g in sodium chloride 0.9 % 100 mL IVPB (0 g Intravenous Stopped 06/29/23 2105)  sodium chloride 0.9 % bolus 500 mL (0 mLs Intravenous Stopped 06/29/23 2124)    ED Course/ Medical Decision Making/ A&P                             Medical Decision Making Amount and/or Complexity of Data Reviewed Labs: ordered. Radiology: ordered. ECG/medicine tests: ordered.  Risk Prescription drug management. Decision regarding hospitalization.   This patient presents to the ED for concern of altered mental status, this involves an extensive number of treatment options, and is a complaint that carries with it a high risk of complications and morbidity.  The differential diagnosis includes but not limited to pneumonia, stroke, encephalopathy, electrolyte disturbance, metabolic abnormality, hypoxia   Co  morbidities that complicate the patient evaluation  Dementia, seizures, breast cancer   Additional history obtained:  Additional history obtained from family at bedside who contribute to history as above External records from outside source obtained and reviewed including echo dated 02/03/2023 with EF 65-70%   Lab Tests:  I Ordered, and personally interpreted labs.  The pertinent results include: Ammonia is mildly elevated at 41.  Lactic acid normal at 0.8.  CBC with leukopenia with white count of 2.7 with decrease in neutrophils.  ABG with elevated pCO2 at 64.5.  CMP without significant findings.  Urinalysis is negative.   Imaging Studies ordered:  I ordered imaging studies including chest x-ray, CT head, CT chest PE study I independently visualized and interpreted imaging which showed negative for PE, negative for acute intracranial findings.  Does show pleural effusions questioning malignancy versus pneumonia I agree with the radiologist interpretation   Cardiac Monitoring: / EKG:  The patient was maintained on a cardiac monitor.  I personally viewed and interpreted the cardiac monitored which showed an underlying rhythm of: Initial EKG obtained with significant artifact.  Requested second EKG.  EKG is pending at time of admission.   Consultations Obtained:  I [facility]  and discussed lab and imaging findings as well as pertinent plan - they recommend: will consult for admission   Problem List / ED Course / Critical interventions / Medication management  67 year old female presents from home via EMS with concern for change in mental status.  Patient's husband states that her oxygen saturations have been lower at night in the 88-92 range.  Family notes that patient has not been eating very much recently, they have been trying to help her with eating and drinking and are concerned that she aspirates frequently.  Patient  opens eyes to voice and will say "pain" when asked any questions.  She has notable pitting edema to her lower extremities and diminished breath sounds in the lower fields. After pro longed workup with broad differential, patient is found to have likely pneumonia, meeting sepsis criteria with leukopenia, hypothermia, hypoxia.  Her blood pressures have been stable, with her significant lower extremity pitting edema, IV fluids were provided gently.  IV antibiotics were ordered to cover her  pneumonia and a warmer was requested for her hypothermia.  Family was updated with plan of care, patient was admitted to the hospitalist service. I ordered medication including IV fluids, antibiotics for sepsis.  Oxygen for hypoxia Reevaluation of the patient after these medicines showed that the patient  mental status improved. I have reviewed the patients home medicines and have made adjustments as needed   Social Determinants of Health:  Lives with family   Test / Admission - Considered:  Admit         Final Clinical Impression(s) / ED Diagnoses Final diagnoses:  Pneumonia of both lower lobes due to infectious organism  Hypothermia, initial encounter  Hypoxia  Sepsis without acute organ dysfunction, due to unspecified organism [facility])    Rx / DC Orders ED Discharge Orders     None         Jeannie Fend, PA-C 06/29/23 2200    Virgina Norfolk, DO 06/30/23 0703

## 2023-06-29 NOTE — Assessment & Plan Note (Signed)
Patient has had marked progression of her symptoms compared to the last neurology visit.  Patient now I believe is aspirating, is not able to get back up, not hold meaningful conversations.  Risk of progression of patient condition in terms of unforeseen complications is high.  I did bring up this fact with the patient husband and suggested that in view of patient's decline in functional status however advanced directives should be reviewed.  I will put in a palliative care evaluation request for the morning.  I have sent a staff message to Dr. Russella Dar, [place] [facility] for the morning to have her evaluate the patient for progressive supranuclear palsy.

## 2023-06-29 NOTE — ED Triage Notes (Signed)
Patient arrives by EMS from home with c/o increased weakness and extra swelling.   Patient is bed bound and has dementia.  Per husband patient has not been getting out of bed for past 3 days.    EMS VITALS 122/75 HR 57 CBG 86

## 2023-06-29 NOTE — Assessment & Plan Note (Addendum)
This is new/worse than before.  I will check B12 folate and anemia panel and electrophoresis.  I will send a type and screen.  Trend CBC, hold Lovenox if thrombocytopenia worsens

## 2023-06-29 NOTE — Progress Notes (Signed)
Pharmacy Antibiotic Note  [name] is a 67 y.o. female admitted on 06/29/2023 with aspiration pneumonia.  Pharmacy has been consulted for Unasyn (ampicillin-sulbactam) dosing.  WBC 2.7, Temp 93.6, LA 0.8 SCr 0.83  Plan: Start Unasyn 3g IV q6h  Monitor daily CBC, temp, SCr, and for clinical signs of improvement  F/u cultures and de-escalate antibiotics as able   Height: 5\' 6"  (167.6 cm) Weight: 93 kg (205 lb) IBW/kg (Calculated) : 59.3  Temp (24hrs), Avg:93.6 F (34.2 C), Min:93.6 F (34.2 C), Max:93.6 F (34.2 C)  Recent Labs  Lab 06/29/23 1559 06/29/23 2021  WBC 2.7*  --   CREATININE 0.83  --   LATICACIDVEN  --  0.8    Estimated Creatinine Clearance: 75.6 mL/min (by C-G formula based on SCr of 0.83 mg/dL).    No Known Allergies  Antimicrobials this admission: Ceftriaxone 7/2 x1  Azithromycin 7/2 >> Unasyn 7/2 >>   Dose adjustments this admission: N/A  Microbiology results: 7/2 BCx: sent   Thank you for allowing pharmacy to be a part of this patient's care.  Wilburn Cornelia, PharmD, BCPS Clinical Pharmacist 06/29/2023 10:39 PM   Please refer to [facility] for pharmacy phone number

## 2023-06-29 NOTE — Assessment & Plan Note (Signed)
Well-controlled, continue with valproic acid, this is a chronic diagnosis

## 2023-06-29 NOTE — Assessment & Plan Note (Signed)
Treated with sepsis evident as low white count, hypothermia.  Continue with warming blanket.  S/p ceftriaxone.  Continue with Unasyn.  CAT scan reviewed, very low concern for atypical infections.  Patient has associated pleural effusions.  Which are felt to be also due to anasarca.  4 of patient will not get additional fluid.

## 2023-06-29 NOTE — Assessment & Plan Note (Addendum)
This is mixed with the hypercapnia as well as elevated AA gradient.  Likely due to both patient neuromuscular/chest wall disability as well as pneumonia as well as pleural effusion seen on CAT scan.  Continue with oxygen as needed. Now on 2lpm oxygen from 1 chronically.

## 2023-06-29 NOTE — ED Notes (Signed)
..ED TO INPATIENT HANDOFF REPORT  ED Nurse Name and Phone #: [phone]  S Name/Age/Gender [name] 67 y.o. female Room/Bed: 004C/004C  Code Status   Code Status: Full Code  Home/SNF/Other Home Patient oriented to: self, place Is this baseline? Yes   Triage Complete: Triage complete  Chief Complaint Anasarca [R60.1]  Triage Note Patient arrives by EMS from home with c/o increased weakness and extra swelling.   Patient is bed bound and has dementia.  Per husband patient has not been getting out of bed for past 3 days.    EMS VITALS 122/75 HR 57 CBG 86   Allergies No Known Allergies  Level of Care/Admitting Diagnosis ED Disposition     ED Disposition  Admit   Condition  --   Comment  Hospital Area: [facility] [[id number]]  Level of Care: Telemetry Medical [[id number]]  May admit patient to [facility] or [facility] if equivalent level of care is available:: No  Covid Evaluation: Asymptomatic - no recent exposure (last 10 days) testing not required  Diagnosis: Anasarca [[id number]]  Admitting Physician: Nolberto Hanlon [[id number]]  Attending Physician: Nolberto Hanlon [[id number]]  Certification:: I certify this patient will need inpatient services for at least 2 midnights  Estimated Length of Stay: 3          B Medical/Surgery History Past Medical History:  Diagnosis Date   Altered mental status 01/18/2023   Arthritis lower back/hands   Atypical chest pain 04/15/2017   Atypical chest pain   Brain injury (HCC)    Cancer (HCC) breast tumor removed   COVID-19 02/02/2023   Dementia (HCC)    PSD-FTD   Disorientation 01/18/2023   Hip fracture (HCC) 09/06/2019   History of epilepsy 02/02/2023   Leg fracture    Seizure disorder (HCC)    Seizures (HCC)    Sepsis (HCC) 01/11/2023   VT (ventricular tachycardia) (HCC) 04/29/2017   Past Surgical History:  Procedure Laterality Date   BREAST SURGERY     FACIAL FRACTURE SURGERY  orbital bone repair   LEFT  HEART CATH AND CORONARY ANGIOGRAPHY N/A 04/30/2017   Procedure: Left Heart Cath and Coronary Angiography;  Surgeon: Marykay Lex, MD;  Location: [facility] INVASIVE CV LAB;  Service: Cardiovascular;  Laterality: N/A;   TOTAL HIP ARTHROPLASTY Left 09/07/2019   Procedure: TOTAL HIP ARTHROPLASTY ANTERIOR APPROACH;  Surgeon: Samson Frederic, MD;  Location: [facility] ORS;  Service: Orthopedics;  Laterality: Left;     A IV Location/Drains/Wounds Patient Lines/Drains/Airways Status     Active Line/Drains/Airways     Name Placement date Placement time Site Days   Peripheral IV 06/29/23 20 G 2.5" Right;Anterior;Lateral Forearm 06/29/23  1641  Forearm  less than 1   Wound / Incision (Open or Dehisced) 02/03/23 Skin tear Coccyx Right;Left Pt has 2 open blisters on Left and Right Coccyx area, blanchable, 02/03/23  1209  Coccyx  146            Intake/Output Last 24 hours No intake or output data in the 24 hours ending 06/29/23 2319  Labs/Imaging Results for orders placed or performed during the hospital encounter of 06/29/23 (from the past 48 hour(s))  I-Stat arterial blood gas, ED ([facility], [facility], [facility])     Status: Abnormal   Collection Time: 06/29/23  3:00 PM  Result Value Ref Range   pH, Arterial 7.392 7.35 - 7.45   pCO2 arterial 64.5 (H) 32 - 48 mmHg   pO2, Arterial 63 (L) 83 - 108  mmHg   Bicarbonate 39.2 (H) 20.0 - 28.0 mmol/L   TCO2 41 (H) 22 - 32 mmol/L   O2 Saturation 91 %   Acid-Base Excess 12.0 (H) 0.0 - 2.0 mmol/L   Sodium 128 (L) 135 - 145 mmol/L   Potassium 4.0 3.5 - 5.1 mmol/L   Calcium, Ion 1.25 1.15 - 1.40 mmol/L   HCT 29.0 (L) 36.0 - 46.0 %   Hemoglobin 9.9 (L) 12.0 - 15.0 g/dL   Patient temperature 09.8 F    Collection site RADIAL, ALLEN'S TEST ACCEPTABLE    Drawn by RT    Sample type ARTERIAL   Comprehensive metabolic panel     Status: Abnormal   Collection Time: 06/29/23  3:59 PM  Result Value Ref Range   Sodium 131 (L) 135 - 145 mmol/L   Potassium 4.2 3.5 - 5.1 mmol/L    Chloride 87 (L) 98 - 111 mmol/L   CO2 36 (H) 22 - 32 mmol/L   Glucose, Bld 74 70 - 99 mg/dL    Comment: Glucose reference range applies only to samples taken after fasting for at least 8 hours.   BUN 7 (L) 8 - 23 mg/dL   Creatinine, Ser 1.19 0.44 - 1.00 mg/dL   Calcium 9.0 8.9 - 14.7 mg/dL   Total Protein 5.9 (L) 6.5 - 8.1 g/dL   Albumin 2.4 (L) 3.5 - 5.0 g/dL   AST 21 15 - 41 U/L   ALT 14 0 - 44 U/L   Alkaline Phosphatase 69 38 - 126 U/L   Total Bilirubin 0.5 0.3 - 1.2 mg/dL   GFR, Estimated >82 >95 mL/min    Comment: (NOTE) Calculated using the CKD-EPI Creatinine Equation (2021)    Anion gap 8 5 - 15    Comment: Performed at [facility], [address], [place], [place] [address]  CBC with Differential     Status: Abnormal   Collection Time: 06/29/23  3:59 PM  Result Value Ref Range   WBC 2.7 (L) 4.0 - 10.5 K/uL   RBC 3.41 (L) 3.87 - 5.11 MIL/uL   Hemoglobin 10.9 (L) 12.0 - 15.0 g/dL   HCT 86.5 (L) 78.4 - 69.6 %   MCV 95.3 80.0 - 100.0 fL   MCH 32.0 26.0 - 34.0 pg   MCHC 33.5 30.0 - 36.0 g/dL   RDW 29.5 (H) 28.4 - 13.2 %   Platelets 63 (L) 150 - 400 K/uL    Comment: SPECIMEN CHECKED FOR CLOTS Immature Platelet Fraction may be clinically indicated, consider ordering this additional test [id number] REPEATED TO VERIFY PLATELET COUNT CONFIRMED BY SMEAR    nRBC 0.0 0.0 - 0.2 %   Neutrophils Relative % 57 %   Neutro Abs 1.6 (L) 1.7 - 7.7 K/uL   Lymphocytes Relative 35 %   Lymphs Abs 1.0 0.7 - 4.0 K/uL   Monocytes Relative 6 %   Monocytes Absolute 0.2 0.1 - 1.0 K/uL   Eosinophils Relative 1 %   Eosinophils Absolute 0.0 0.0 - 0.5 K/uL   Basophils Relative 0 %   Basophils Absolute 0.0 0.0 - 0.1 K/uL   Immature Granulocytes 1 %   Abs Immature Granulocytes 0.03 0.00 - 0.07 K/uL    Comment: Performed at [facility], [address], [place], [place] [address]  Protime-INR     Status: None   Collection Time: 06/29/23  3:59 PM  Result Value Ref Range    Prothrombin Time 13.3 11.4 - 15.2 seconds  INR 1.0 0.8 - 1.2    Comment: (NOTE) INR goal varies based on device and disease states. Performed at [facility], [address] [place], [place] [address]   APTT     Status: Abnormal   Collection Time: 06/29/23  3:59 PM  Result Value Ref Range   aPTT 23 (L) 24 - 36 seconds    Comment: Performed at [facility], [address], [place], [place] [address]  Brain natriuretic peptide     Status: Abnormal   Collection Time: 06/29/23  3:59 PM  Result Value Ref Range   B Natriuretic Peptide 198.6 (H) 0.0 - 100.0 pg/mL    Comment: Performed at [facility], [address], [place], [place] [address]  Ammonia     Status: Abnormal   Collection Time: 06/29/23  4:37 PM  Result Value Ref Range   Ammonia 41 (H) 9 - 35 umol/L    Comment: Performed at [facility], [address], [place], [place] [address]  Urinalysis, w/ Reflex to Culture (Infection Suspected) -Urine, Catheterized     Status: Abnormal   Collection Time: 06/29/23  7:15 PM  Result Value Ref Range   Specimen Source URINE, CATHETERIZED    Color, Urine YELLOW YELLOW   APPearance CLEAR CLEAR   Specific Gravity, Urine 1.004 (L) 1.005 - 1.030   pH 7.0 5.0 - 8.0   Glucose, UA NEGATIVE NEGATIVE mg/dL   Hgb urine dipstick NEGATIVE NEGATIVE   Bilirubin Urine NEGATIVE NEGATIVE   Ketones, ur NEGATIVE NEGATIVE mg/dL   Protein, ur NEGATIVE NEGATIVE mg/dL   Nitrite NEGATIVE NEGATIVE   Leukocytes,Ua NEGATIVE NEGATIVE   RBC / HPF 0-5 0 - 5 RBC/hpf   WBC, UA 0-5 0 - 5 WBC/hpf    Comment:        Reflex urine culture not performed if WBC <=10, OR if Squamous epithelial cells >5. If Squamous epithelial cells >5 suggest recollection.    Bacteria, UA NONE SEEN NONE SEEN   Squamous Epithelial / HPF 0-5 0 - 5 /HPF    Comment: Performed at [facility], [address], [place], [place] [address]  Lactic acid, plasma     Status: None   Collection Time: 06/29/23  8:21  PM  Result Value Ref Range   Lactic Acid, Venous 0.8 0.5 - 1.9 mmol/L    Comment: Performed at [facility], [address] [place], [place] [address]  Hemoglobin A1c     Status: None   Collection Time: 06/29/23 10:52 PM  Result Value Ref Range   Hgb A1c MFr Bld 5.0 4.8 - 5.6 %    Comment: (NOTE) Pre diabetes:          5.7%-6.4%  Diabetes:              >6.4%  Glycemic control for   <7.0% adults with diabetes    Mean Plasma Glucose 96.8 mg/dL    Comment: Performed at [facility], [address], [place], [place] [address]   CT Angio Chest PE W/Cm &/Or Wo Cm  Result Date: 06/29/2023 CLINICAL DATA:  Concern for pulmonary embolism. EXAM: CT ANGIOGRAPHY CHEST WITH CONTRAST TECHNIQUE: Multidetector CT imaging of the chest was performed using the standard protocol during bolus administration of intravenous contrast. Multiplanar CT image reconstructions and MIPs were obtained to evaluate the vascular anatomy. RADIATION DOSE REDUCTION: This exam was performed according to the departmental dose-optimization program which includes automated  exposure control, adjustment of the mA and/or kV according to patient size and/or use of iterative reconstruction technique. CONTRAST:  75mL OMNIPAQUE IOHEXOL 350 MG/ML SOLN COMPARISON:  Chest CT dated 03/12/2023. FINDINGS: Evaluation is limited due to respiratory motion and streak artifact caused by patient's arms. Cardiovascular: There is no cardiomegaly or pericardial effusion. Mild atherosclerotic calcification of the thoracic aorta. No aneurysmal dilatation. Evaluation of the pulmonary arteries is limited due to factors above. No pulmonary artery embolus identified. Mediastinum/Nodes: Subcarinal lymph node measures 16 mm short axis. No obvious hilar adenopathy. Evaluation of the hilar lymph nodes however is limited due to consolidative changes of the lungs. The esophagus is grossly unremarkable. No mediastinal fluid collection. Lungs/Pleura: Small  bilateral pleural effusions, significantly increased since the prior CT. There is complete consolidation of the left lower lobe with partial consolidation of the right lower lobe which may represent atelectasis or pneumonia. Underlying mass is not excluded clinical correlation and follow-up to resolution recommended. There is mosaic attenuation of the aerated lung which may represent air trapping and related to underlying small airways versus small vessel disease. There is no pneumothorax. High-grade narrowing of the bilateral lower lobe bronchi, possibly related to mucous impaction. Aspiration is not excluded. Upper Abdomen: No acute abnormality. Musculoskeletal: Osteopenia with degenerative changes of the spine. Several old thoracic spine compression fractures. No acute osseous pathology. Review of the MIP images confirms the above findings. IMPRESSION: 1. No CT evidence of pulmonary embolism. 2. Small bilateral pleural effusions, significantly increased since the prior CT. 3. Complete consolidation of the left lower lobe with partial consolidation of the right lower lobe which may represent atelectasis or pneumonia. Underlying mass is not excluded clinical correlation and follow-up to resolution recommended. 4.  Aortic Atherosclerosis (ICD10-I70.0). Electronically Signed   By: Elgie Collard M.D.   On: 06/29/2023 19:46   CT Head Wo Contrast  Result Date: 06/29/2023 CLINICAL DATA:  Mental status change, unknown cause EXAM: CT HEAD WITHOUT CONTRAST TECHNIQUE: Contiguous axial images were obtained from the base of the skull through the vertex without intravenous contrast. RADIATION DOSE REDUCTION: This exam was performed according to the departmental dose-optimization program which includes automated exposure control, adjustment of the mA and/or kV according to patient size and/or use of iterative reconstruction technique. COMPARISON:  None Available. FINDINGS: Brain: There is atrophy and chronic small vessel  disease changes. No acute intracranial abnormality. Specifically, no hemorrhage, hydrocephalus, mass lesion, acute infarction, or significant intracranial injury. Vascular: No hyperdense vessel or unexpected calcification. Skull: No acute calvarial abnormality. Sinuses/Orbits: Mucosal thickening within the left maxillary sinus, ethmoid air cells and bilateral frontal sinuses. Air-fluid level in the sphenoid sinuses. Other: None IMPRESSION: Atrophy, chronic microvascular disease. No acute intracranial abnormality. Acute on chronic sinusitis. Electronically Signed   By: Charlett Nose M.D.   On: 06/29/2023 19:41   DG Chest Port 1 View  Result Date: 06/29/2023 CLINICAL DATA:  Questionable sepsis EXAM: PORTABLE CHEST 1 VIEW COMPARISON:  X-ray and CT 03/12/2023 FINDINGS: Increasing bilateral small to moderate pleural effusions with adjacent opacities. No pneumothorax. Film is rotated to the left. Enlarged cardiopericardial silhouette. No edema. Degenerative changes of the spine. IMPRESSION: Developing small to moderate pleural effusions. Adjacent lung opacities. Recommend follow-up Electronically Signed   By: Karen Kays M.D.   On: 06/29/2023 16:28    Pending Labs Unresulted Labs (From admission, onward)     Start     Ordered   06/30/23 0500  Basic metabolic panel  Tomorrow morning,   R  06/29/23 2159   06/30/23 0500  Vitamin B12  (Anemia Panel (PNL))  Tomorrow morning,   R        06/29/23 2225   06/30/23 0500  Folate  (Anemia Panel (PNL))  Tomorrow morning,   R        06/29/23 2225   06/30/23 0500  Iron and TIBC  (Anemia Panel (PNL))  Tomorrow morning,   R        06/29/23 2225   06/30/23 0500  Ferritin  (Anemia Panel (PNL))  Tomorrow morning,   R        06/29/23 2225   06/30/23 0500  Reticulocytes  (Anemia Panel (PNL))  Tomorrow morning,   R        06/29/23 2225   06/30/23 0500  Protein electrophoresis, serum  Tomorrow morning,   R        06/29/23 2225   06/30/23 0500  Magnesium  Tomorrow  morning,   R        06/29/23 2232   06/29/23 2225  Type and screen [facility]  Once,   STAT       Comments: [facility]    06/29/23 2225   06/29/23 2159  Protein / creatinine ratio, urine  Once,   URGENT        06/29/23 2159   06/29/23 2159  Urinalysis, Complete w Microscopic -Urine, Catheterized  Once,   URGENT       Question:  Specimen Source  Answer:  Urine, Catheterized   06/29/23 2159   06/29/23 1438  Lactic acid, plasma  (Undifferentiated presentation (screening labs and basic nursing orders))  Now then every 2 hours,   R (with STAT occurrences)      06/29/23 1438   06/29/23 1438  Blood Culture (routine x 2)  (Undifferentiated presentation (screening labs and basic nursing orders))  BLOOD CULTURE X 2,   STAT      06/29/23 1438   Signed and Held  CBC  (enoxaparin (LOVENOX)    CrCl >/= 30 ml/min)  Once,   R       Comments: Baseline for enoxaparin therapy IF NOT ALREADY DRAWN.  Notify MD if PLT < 100 K.    Signed and Held   Signed and Held  Creatinine, serum  (enoxaparin (LOVENOX)    CrCl >/= 30 ml/min)  Once,   R       Comments: Baseline for enoxaparin therapy IF NOT ALREADY DRAWN.    Signed and Held   Signed and Held  Creatinine, serum  (enoxaparin (LOVENOX)    CrCl >/= 30 ml/min)  Weekly,   R     Comments: while on enoxaparin therapy    Signed and Held   Signed and Held  APTT  Tomorrow morning,   R        Signed and Held   Signed and Held  Protime-INR  Tomorrow morning,   R        Signed and Medical illustrator and Armed forces training and education officer morning,   R        Signed and Held   Signed and Held  CBC  Tomorrow morning,   R        Signed and Held   Signed and Held  Magnesium  Tomorrow morning,   R        Signed and Held            Vitals/Pain Today's  Vitals   06/29/23 2157 06/29/23 2157 06/29/23 2200 06/29/23 2317  BP:   101/79   Pulse:   (!) 58   Resp:   13   Temp:    (!) 94.2 F (34.6 C)  TempSrc:    Rectal  SpO2:  97%  99%   Weight: 205 lb (93 kg)     Height: 5\' 6"  (1.676 m)       Isolation Precautions No active isolations  Medications Medications  lactated ringers infusion ( Intravenous New Bag/Given 06/29/23 2124)  azithromycin (ZITHROMAX) 500 mg in sodium chloride 0.9 % 250 mL IVPB (0 mg Intravenous Stopped 06/29/23 2315)  furosemide (LASIX) 200 mg in dextrose 5 % 100 mL (2 mg/mL) infusion (has no administration in time range)  insulin aspart (novoLOG) injection 0-15 Units (has no administration in time range)  insulin aspart (novoLOG) injection 0-5 Units ( Subcutaneous Not Given 06/29/23 2259)  Ampicillin-Sulbactam (UNASYN) 3 g in sodium chloride 0.9 % 100 mL IVPB (0 g Intravenous Stopped 06/29/23 2314)  iohexol (OMNIPAQUE) 350 MG/ML injection 75 mL (75 mLs Intravenous Contrast Given 06/29/23 1935)  cefTRIAXone (ROCEPHIN) 1 g in sodium chloride 0.9 % 100 mL IVPB (0 g Intravenous Stopped 06/29/23 2105)  sodium chloride 0.9 % bolus 500 mL (0 mLs Intravenous Stopped 06/29/23 2124)    Mobility non-ambulatory     Focused Assessments Neuro Assessment Handoff:  Swallow screen pass? No  Cardiac Rhythm: Normal sinus rhythm       Neuro Assessment: Exceptions to [facility] Neuro Checks:      Has TPA been given? No If patient is a Neuro Trauma and patient is going to OR before floor call report to 4N Charge nurse: [phone] or [phone]   R Recommendations: See Admitting Provider Note  Report given to:   Additional Notes: pt has low bp, and her temp is low is on a bair hugger, A&Ox2.

## 2023-06-30 ENCOUNTER — Inpatient Hospital Stay (HOSPITAL_COMMUNITY): Payer: Medicare PPO

## 2023-06-30 DIAGNOSIS — R601 Generalized edema: Secondary | ICD-10-CM | POA: Diagnosis not present

## 2023-06-30 DIAGNOSIS — M7989 Other specified soft tissue disorders: Secondary | ICD-10-CM

## 2023-06-30 LAB — VITAMIN B12: Vitamin B-12: 1252 pg/mL — ABNORMAL HIGH (ref 180–914)

## 2023-06-30 LAB — CBC WITH DIFFERENTIAL/PLATELET
Abs Immature Granulocytes: 0.02 10*3/uL (ref 0.00–0.07)
Basophils Absolute: 0 10*3/uL (ref 0.0–0.1)
Basophils Relative: 0 %
Eosinophils Absolute: 0 10*3/uL (ref 0.0–0.5)
Eosinophils Relative: 0 %
HCT: 29.3 % — ABNORMAL LOW (ref 36.0–46.0)
Hemoglobin: 9.9 g/dL — ABNORMAL LOW (ref 12.0–15.0)
Immature Granulocytes: 1 %
Lymphocytes Relative: 32 %
Lymphs Abs: 1 10*3/uL (ref 0.7–4.0)
MCH: 32.1 pg (ref 26.0–34.0)
MCHC: 33.8 g/dL (ref 30.0–36.0)
MCV: 95.1 fL (ref 80.0–100.0)
Monocytes Absolute: 0.2 10*3/uL (ref 0.1–1.0)
Monocytes Relative: 7 %
Neutro Abs: 1.9 10*3/uL (ref 1.7–7.7)
Neutrophils Relative %: 60 %
Platelets: 69 10*3/uL — ABNORMAL LOW (ref 150–400)
RBC: 3.08 MIL/uL — ABNORMAL LOW (ref 3.87–5.11)
RDW: 16.6 % — ABNORMAL HIGH (ref 11.5–15.5)
WBC: 3.1 10*3/uL — ABNORMAL LOW (ref 4.0–10.5)
nRBC: 0 % (ref 0.0–0.2)

## 2023-06-30 LAB — BASIC METABOLIC PANEL
Anion gap: 7 (ref 5–15)
Anion gap: 9 (ref 5–15)
BUN: 5 mg/dL — ABNORMAL LOW (ref 8–23)
BUN: 5 mg/dL — ABNORMAL LOW (ref 8–23)
CO2: 35 mmol/L — ABNORMAL HIGH (ref 22–32)
CO2: 35 mmol/L — ABNORMAL HIGH (ref 22–32)
Calcium: 8.6 mg/dL — ABNORMAL LOW (ref 8.9–10.3)
Calcium: 8.8 mg/dL — ABNORMAL LOW (ref 8.9–10.3)
Chloride: 90 mmol/L — ABNORMAL LOW (ref 98–111)
Chloride: 91 mmol/L — ABNORMAL LOW (ref 98–111)
Creatinine, Ser: 0.72 mg/dL (ref 0.44–1.00)
Creatinine, Ser: 0.72 mg/dL (ref 0.44–1.00)
GFR, Estimated: >60 mL/min (ref >60)
GFR, Estimated: >60 mL/min (ref >60)
Glucose, Bld: 80 mg/dL (ref 70–99)
Glucose, Bld: 82 mg/dL (ref 70–99)
Potassium: 4 mmol/L (ref 3.5–5.1)
Potassium: 4 mmol/L (ref 3.5–5.1)
Sodium: 133 mmol/L — ABNORMAL LOW (ref 135–145)
Sodium: 134 mmol/L — ABNORMAL LOW (ref 135–145)

## 2023-06-30 LAB — MAGNESIUM
Magnesium: 1.5 mg/dL — ABNORMAL LOW (ref 1.7–2.4)
Magnesium: 1.6 mg/dL — ABNORMAL LOW (ref 1.7–2.4)

## 2023-06-30 LAB — CULTURE, BLOOD (ROUTINE X 2): Culture: NO GROWTH

## 2023-06-30 LAB — PROTIME-INR
INR: 1 (ref 0.8–1.2)
Prothrombin Time: 13.8 seconds (ref 11.4–15.2)

## 2023-06-30 LAB — GLUCOSE, CAPILLARY
Glucose-Capillary: 101 mg/dL — ABNORMAL HIGH (ref 70–99)
Glucose-Capillary: 112 mg/dL — ABNORMAL HIGH (ref 70–99)
Glucose-Capillary: 71 mg/dL (ref 70–99)
Glucose-Capillary: 86 mg/dL (ref 70–99)

## 2023-06-30 LAB — CBC
HCT: 29.4 % — ABNORMAL LOW (ref 36.0–46.0)
Hemoglobin: 9.8 g/dL — ABNORMAL LOW (ref 12.0–15.0)
MCH: 31.8 pg (ref 26.0–34.0)
MCHC: 33.3 g/dL (ref 30.0–36.0)
MCV: 95.5 fL (ref 80.0–100.0)
Platelets: 68 10*3/uL — ABNORMAL LOW (ref 150–400)
RBC: 3.08 MIL/uL — ABNORMAL LOW (ref 3.87–5.11)
RDW: 16.7 % — ABNORMAL HIGH (ref 11.5–15.5)
WBC: 3 10*3/uL — ABNORMAL LOW (ref 4.0–10.5)
nRBC: 0 % (ref 0.0–0.2)

## 2023-06-30 LAB — FERRITIN: Ferritin: 228 ng/mL (ref 11–307)

## 2023-06-30 LAB — APTT: aPTT: 31 seconds (ref 24–36)

## 2023-06-30 LAB — MRSA NEXT GEN BY PCR, NASAL: MRSA by PCR Next Gen: NOT DETECTED

## 2023-06-30 LAB — IRON AND TIBC
Iron: 66 ug/dL (ref 28–170)
Saturation Ratios: 22 % (ref 10.4–31.8)
TIBC: 301 ug/dL (ref 250–450)
UIBC: 235 ug/dL

## 2023-06-30 LAB — TYPE AND SCREEN
ABO/RH(D): O NEG
Antibody Screen: NEGATIVE

## 2023-06-30 LAB — TECHNOLOGIST SMEAR REVIEW: Plt Morphology: NORMAL

## 2023-06-30 LAB — FOLATE: Folate: 18.6 ng/mL (ref >5.9)

## 2023-06-30 LAB — RETICULOCYTES
Immature Retic Fract: 13.9 % (ref 2.3–15.9)
RBC.: 3.01 MIL/uL — ABNORMAL LOW (ref 3.87–5.11)
Retic Count, Absolute: 120.7 10*3/uL (ref 19.0–186.0)
Retic Ct Pct: 4 % — ABNORMAL HIGH (ref 0.4–3.1)

## 2023-06-30 LAB — TROPONIN I (HIGH SENSITIVITY): Troponin I (High Sensitivity): 4 ng/L (ref <18)

## 2023-06-30 LAB — LACTIC ACID, PLASMA: Lactic Acid, Venous: 0.9 mmol/L (ref 0.5–1.9)

## 2023-06-30 LAB — VALPROIC ACID LEVEL: Valproic Acid Lvl: 117 ug/mL — ABNORMAL HIGH (ref 50.0–100.0)

## 2023-06-30 MED ORDER — ALBUMIN HUMAN 25 % IV SOLN
50.0000 g | Freq: Once | INTRAVENOUS | Status: AC
  Administered 2023-06-30: 50 g via INTRAVENOUS
  Filled 2023-06-30: qty 200

## 2023-06-30 MED ORDER — SODIUM CHLORIDE 0.9 % IV SOLN
100.0000 mg | Freq: Two times a day (BID) | INTRAVENOUS | Status: DC
  Administered 2023-07-01 – 2023-07-02 (×4): 100 mg via INTRAVENOUS
  Filled 2023-06-30 (×6): qty 10

## 2023-06-30 MED ORDER — MAGNESIUM SULFATE 4 GM/100ML IV SOLN
4.0000 g | Freq: Once | INTRAVENOUS | Status: AC
  Administered 2023-06-30: 4 g via INTRAVENOUS
  Filled 2023-06-30: qty 100

## 2023-06-30 MED ORDER — NOREPINEPHRINE 4 MG/250ML-% IV SOLN
INTRAVENOUS | Status: AC
  Administered 2023-06-30: 2 ug/min via INTRAVENOUS
  Filled 2023-06-30: qty 250

## 2023-06-30 MED ORDER — CHLORHEXIDINE GLUCONATE CLOTH 2 % EX PADS
6.0000 | MEDICATED_PAD | Freq: Every day | CUTANEOUS | Status: DC
  Administered 2023-06-30 – 2023-07-08 (×9): 6 via TOPICAL

## 2023-06-30 MED ORDER — CLOBAZAM 5 MG PO HALF TABLET
15.0000 mg | ORAL_TABLET | Freq: Every day | ORAL | Status: DC
  Filled 2023-06-30: qty 3

## 2023-06-30 MED ORDER — NOREPINEPHRINE 4 MG/250ML-% IV SOLN
2.0000 ug/min | INTRAVENOUS | Status: DC
  Administered 2023-07-01: 4 ug/min via INTRAVENOUS
  Administered 2023-07-02: 2 ug/min via INTRAVENOUS
  Administered 2023-07-03 (×2): 9 ug/min via INTRAVENOUS
  Administered 2023-07-03 (×2): 10 ug/min via INTRAVENOUS
  Administered 2023-07-04: 8 ug/min via INTRAVENOUS
  Administered 2023-07-04 – 2023-07-05 (×3): 6 ug/min via INTRAVENOUS
  Administered 2023-07-05: 5.5 ug/min via INTRAVENOUS
  Administered 2023-07-06: 5 ug/min via INTRAVENOUS
  Administered 2023-07-07: 3 ug/min via INTRAVENOUS
  Filled 2023-06-30 (×13): qty 250

## 2023-06-30 MED ORDER — SODIUM CHLORIDE 0.9 % IV SOLN
INTRAVENOUS | Status: DC | PRN
  Administered 2023-07-05: 10 mL/h via INTRAVENOUS

## 2023-06-30 MED ORDER — ALBUMIN HUMAN 25 % IV SOLN
25.0000 g | Freq: Once | INTRAVENOUS | Status: AC
  Administered 2023-06-30: 25 g via INTRAVENOUS
  Filled 2023-06-30: qty 100

## 2023-06-30 MED ORDER — SODIUM CHLORIDE 3 % IN NEBU
4.0000 mL | INHALATION_SOLUTION | Freq: Two times a day (BID) | RESPIRATORY_TRACT | Status: DC
  Administered 2023-06-30 – 2023-07-02 (×5): 4 mL via RESPIRATORY_TRACT
  Filled 2023-06-30 (×6): qty 4

## 2023-06-30 MED ORDER — VALPROIC ACID 250 MG/5ML PO SOLN
250.0000 mg | ORAL | Status: DC
  Administered 2023-06-30: 250 mg via ORAL
  Filled 2023-06-30 (×3): qty 5

## 2023-06-30 MED ORDER — PROPAFENONE HCL ER 225 MG PO CP12
225.0000 mg | ORAL_CAPSULE | Freq: Two times a day (BID) | ORAL | Status: DC
  Filled 2023-06-30: qty 1

## 2023-06-30 MED ORDER — PANTOPRAZOLE SODIUM 20 MG PO TBEC
20.0000 mg | DELAYED_RELEASE_TABLET | Freq: Every day | ORAL | Status: DC
  Administered 2023-06-30 – 2023-07-01 (×2): 20 mg via ORAL
  Filled 2023-06-30 (×3): qty 1

## 2023-06-30 MED ORDER — IPRATROPIUM-ALBUTEROL 0.5-2.5 (3) MG/3ML IN SOLN
3.0000 mL | Freq: Four times a day (QID) | RESPIRATORY_TRACT | Status: DC
  Administered 2023-06-30 – 2023-07-02 (×8): 3 mL via RESPIRATORY_TRACT
  Filled 2023-06-30 (×9): qty 3

## 2023-06-30 MED ORDER — ENOXAPARIN SODIUM 40 MG/0.4ML IJ SOSY
40.0000 mg | PREFILLED_SYRINGE | INTRAMUSCULAR | Status: DC
  Administered 2023-06-30 – 2023-07-01 (×2): 40 mg via SUBCUTANEOUS
  Filled 2023-06-30 (×2): qty 0.4

## 2023-06-30 MED ORDER — SODIUM CHLORIDE 0.9% FLUSH
3.0000 mL | Freq: Two times a day (BID) | INTRAVENOUS | Status: DC
  Administered 2023-06-30 – 2023-07-08 (×14): 3 mL via INTRAVENOUS

## 2023-06-30 MED ORDER — LEVOTHYROXINE SODIUM 100 MCG PO TABS: 100.0000 ug | ORAL_TABLET | Freq: Every day | ORAL | Status: DC

## 2023-06-30 MED ORDER — SODIUM CHLORIDE 0.9 % IV SOLN
200.0000 mg | Freq: Once | INTRAVENOUS | Status: AC
  Administered 2023-06-30: 200 mg via INTRAVENOUS
  Filled 2023-06-30: qty 20

## 2023-06-30 MED ORDER — CLOBAZAM 5 MG PO HALF TABLET
10.0000 mg | ORAL_TABLET | Freq: Every day | ORAL | Status: DC
  Administered 2023-06-30 – 2023-07-01 (×2): 10 mg via ORAL
  Filled 2023-06-30 (×2): qty 2

## 2023-06-30 MED ORDER — VALPROIC ACID 250 MG/5ML PO SOLN: 250.0000 mg | ORAL | Status: DC

## 2023-06-30 MED ORDER — VALPROIC ACID 250 MG/5ML PO SOLN: 500.0000 mg | Freq: Every day | ORAL | Status: DC

## 2023-06-30 MED ORDER — PROPAFENONE HCL 150 MG PO TABS
150.0000 mg | ORAL_TABLET | Freq: Three times a day (TID) | ORAL | Status: DC
  Administered 2023-06-30 – 2023-07-01 (×2): 150 mg via ORAL
  Filled 2023-06-30 (×8): qty 1

## 2023-06-30 MED ORDER — ACETAMINOPHEN 325 MG PO TABS: 650.0000 mg | ORAL_TABLET | Freq: Four times a day (QID) | ORAL | Status: DC | PRN

## 2023-06-30 MED ORDER — VALPROIC ACID 250 MG/5ML PO SOLN
500.0000 mg | Freq: Every day | ORAL | Status: DC
  Administered 2023-06-30: 500 mg via ORAL
  Filled 2023-06-30: qty 10

## 2023-06-30 MED ORDER — ACETAMINOPHEN 650 MG RE SUPP: 650.0000 mg | Freq: Four times a day (QID) | RECTAL | Status: DC | PRN

## 2023-06-30 MED ORDER — CLOBAZAM 5 MG PO HALF TABLET
15.0000 mg | ORAL_TABLET | Freq: Every day | ORAL | Status: DC
  Filled 2023-06-30: qty 1

## 2023-06-30 MED ORDER — SODIUM CHLORIDE 0.9 % IV SOLN: 250.0000 mL | INTRAVENOUS | Status: DC

## 2023-06-30 MED ORDER — NEPRO/CARBSTEADY PO LIQD
237.0000 mL | Freq: Three times a day (TID) | ORAL | Status: DC
  Administered 2023-06-30 – 2023-07-01 (×2): 237 mL via ORAL

## 2023-06-30 NOTE — Progress Notes (Signed)
NAME:  [name], MRN:  [id number], DOB:  [birth year], LOS: 1 ADMISSION DATE:  06/29/2023, CONSULTATION DATE:  06/30/23 REFERRING MD:  Odie Sera, MD CHIEF COMPLAINT:  Aspiration   History of Present Illness:  [name] is a 67 year old woman with frontotemporal dementia who was brought in to the ER yesterday for an increase in home O2 needs from 1 to 2L along with cough and trouble swallowing her food/drink. Her husband is her primary care taker and reports increase in lower extremity edema over the past 1 week. She has had progressive decline over recent months with concern for supranuclear palsy by her outpatient neurology team. Unfortunately she has had multiple hospital admissions over recent months with difficulty making the outpatient neurology appointments.  She was admitted yesterday by the hospitalist team and placed on a lasix drip for anasarca, aspiration and failure to thrive.. She developed hypotension earlier this morning in which lasix drip was stopped and given albumin with good effect on blood pressure. She was given PO liquid medication in the ER after passing her bedside swallow eval and then demonstrated concern for aspiration with cough and poor ability to clear her throat. She developed increased work of breathing, increased O2 need and rapid response was called. Patient responded to NT suctioning with improvement of her work of breathing but continued to require frequent suctioning. She was weaned to 6L [place].   PCCM consulted for respiratory failure and aspiration. Recommended patient be admitted to the ICU for increased level of nursing and RT needs for frequent suctioning.   Pertinent  Medical History   Past Medical History:  Diagnosis Date   Altered mental status 01/18/2023   Arthritis lower back/hands   Atypical chest pain 04/15/2017   Atypical chest pain   Brain injury (HCC)    Cancer (HCC) breast tumor removed   COVID-19 02/02/2023   Dementia (HCC)    PSD-FTD    Disorientation 01/18/2023   Hip fracture (HCC) 09/06/2019   History of epilepsy 02/02/2023   Leg fracture    Seizure disorder (HCC)    Seizures (HCC)    Sepsis (HCC) 01/11/2023   VT (ventricular tachycardia) (HCC) 04/29/2017   Significant Hospital Events: Including procedures, antibiotic start and stop dates in addition to other pertinent events   7/2 admitted to hospital  Start azithromycin 500 mg IV for five days (7/2 - 7/7)  Start Unasyn 3 g IV (7/2 - )  Chest Xray: Developing small to moderate pleural effusions. Adjacent lung opacities.  CT Head w/o: Atrophy, chronic microvascular disease. No acute intracranial abnormality. Acute on chronic sinusitis.  CTA Chest: 1. No CT evidence of pulmonary embolism. 2. Small bilateral pleural effusions, significantly increased since the prior CT. 3. Complete consolidation of the left lower lobe with partial consolidation of the right lower lobe which may represent atelectasis or pneumonia. Underlying mass is not excluded clinical correlation and follow-up to resolution recommended. 4.  Aortic Atherosclerosis.  7/3 PCCM consult - hypoxia, aspiration. Remains on pressors   Repeat Chest Xray: Stable bilateral effusions when compared with the previous day    Interim History / Subjective:  Rapid response called overnight for respiratory distress and hypoxia. Initially titrated oxygen down to 2L [place] with SpO2 dropping to upper 80s. NRB replaced and pt NT suctioned x 3. Following suctioning, pt weaned to 6L [place]-94%. She received levophed and albumin for hypotension.     Objective   Blood pressure (!) 82/41, pulse 72, temperature (!) 97.4 F (  36.3 C), temperature source Axillary, resp. rate 18, height 5\' 6"  (1.676 m), weight 90.8 kg, SpO2 95 %.        Intake/Output Summary (Last 24 hours) at 06/30/2023 1306 Last data filed at 06/30/2023 1200 Gross per 24 hour  Intake 1596.11 ml  Output 500 ml  Net 1096.11 ml   Filed Weights   06/29/23 2157  06/30/23 0432  Weight: 93 kg 90.8 kg    Examination: General: elderly woman, in no acute distress, lying peacefully in bed HENT: moist mucous membranes, patchy scaling on forehead, postauricular, and posterior portion of neck  Lungs: diminshed breath sounds, no wheezing or accessory muscle use Cardiovascular: rrr, no murmurs, rubs, or gallops  Abdomen: soft, non-distended, normoactive bowel sounds  Extremities: warm, edema + Neuro: asleep GU: n/a  Resolved Hospital Problem list     Assessment & Plan:  Acute on chronic hypoxemic respiratory failure Aspiration Pneumonia Bilateral Pleural Effusions In the setting of moderate oropharyngeal dysphagia and mild cervical esophageal phase dysphagia per MBS 12/2022. She has generalized anasarca and third spacing leading to bilateral pleural effusions. - Continue unasyn 3 g IV  - Continue azithromycin 500 mg IV  - Wean supplemental O2 for SpO2 92% or greater - PRN NT suctioning - Chest PT via bed - hypertonic saline nebs BID and duonebs q6hrs - strict NPO, convert all meds to IV or place NG tube - Bedside POCUS to evaluate for bilateral pleural effusion   Frontotemporal Dementia Concern for supranuclear palsy - inpatient neurology consult - palliative care consult - monitor   Dysphagia  - Speech therapy consulted  - Nectar-thick liquid diet  - Medication administered crushed with puree   Pancytopenia - new since 02/2023  - B12 WNL - protein electrophoresis ordered - monitor  Anasarca - lasix held in the setting of hypotension  - s/p albumin 25 g IV   Moderate protein calorie malnutrition - consider doboff placement for tube feeds  Best Practice (right click and "Reselect all SmartList Selections" daily)   Per primary team  Labs   CBC: Recent Labs  Lab 06/29/23 1500 06/29/23 1559 06/30/23 0149  WBC  --  2.7* 3.1*  3.0*  NEUTROABS  --  1.6* 1.9  HGB 9.9* 10.9* 9.9*  9.8*  HCT 29.0* 32.5* 29.3*  29.4*  MCV  --   95.3 95.1  95.5  PLT  --  63* 69*  68*    Basic Metabolic Panel: Recent Labs  Lab 06/29/23 1500 06/29/23 1559 06/30/23 0056 06/30/23 0149  NA 128* 131* 133* 134*  K 4.0 4.2 4.0 4.0  CL  --  87* 91* 90*  CO2  --  36* 35* 35*  GLUCOSE  --  74 82 80  BUN  --  7* 5* 5*  CREATININE  --  0.83 0.72 0.72  CALCIUM  --  9.0 8.6* 8.8*  MG  --   --  1.5* 1.6*   GFR: Estimated Creatinine Clearance: 77.5 mL/min (by C-G formula based on SCr of 0.72 mg/dL). Recent Labs  Lab 06/29/23 1559 06/29/23 2021 06/30/23 0056 06/30/23 0149  WBC 2.7*  --   --  3.1*  3.0*  LATICACIDVEN  --  0.8 0.9  --     Liver Function Tests: Recent Labs  Lab 06/29/23 1559  AST 21  ALT 14  ALKPHOS 69  BILITOT 0.5  PROT 5.9*  ALBUMIN 2.4*   No results for input(s): "LIPASE", "AMYLASE" in the last 168 hours. Recent Labs  Lab  06/29/23 1637  AMMONIA 41*    ABG    Component Value Date/Time   PHART 7.392 06/29/2023 1500   PCO2ART 64.5 (H) 06/29/2023 1500   PO2ART 63 (L) 06/29/2023 1500   HCO3 39.2 (H) 06/29/2023 1500   TCO2 41 (H) 06/29/2023 1500   O2SAT 91 06/29/2023 1500     Coagulation Profile: Recent Labs  Lab 06/29/23 1559 06/30/23 0149  INR 1.0 1.0    Cardiac Enzymes: No results for input(s): "CKTOTAL", "CKMB", "CKMBINDEX", "TROPONINI" in the last 168 hours.  HbA1C: Hgb A1c MFr Bld  Date/Time Value Ref Range Status  06/29/2023 10:52 PM 5.0 4.8 - 5.6 % Final    Comment:    (NOTE) Pre diabetes:          5.7%-6.4%  Diabetes:              >6.4%  Glycemic control for   <7.0% adults with diabetes     CBG: Recent Labs  Lab 06/30/23 0426 06/30/23 0739 06/30/23 1103  GLUCAP 86 71 101*    Review of Systems:   As above in HPI  Past Medical History:  She,  has a past medical history of Altered mental status (01/18/2023), Arthritis (lower back/hands), Atypical chest pain (04/15/2017), Brain injury (HCC), Cancer (HCC) (breast tumor removed), COVID-19 (02/02/2023),  Dementia (HCC), Disorientation (01/18/2023), Hip fracture (HCC) (09/06/2019), History of epilepsy (02/02/2023), Leg fracture, Seizure disorder (HCC), Seizures (HCC), Sepsis (HCC) (01/11/2023), and VT (ventricular tachycardia) (HCC) (04/29/2017).   Surgical History:   Past Surgical History:  Procedure Laterality Date   BREAST SURGERY     FACIAL FRACTURE SURGERY  orbital bone repair   LEFT HEART CATH AND CORONARY ANGIOGRAPHY N/A 04/30/2017   Procedure: Left Heart Cath and Coronary Angiography;  Surgeon: Marykay Lex, MD;  Location: [facility] INVASIVE CV LAB;  Service: Cardiovascular;  Laterality: N/A;   TOTAL HIP ARTHROPLASTY Left 09/07/2019   Procedure: TOTAL HIP ARTHROPLASTY ANTERIOR APPROACH;  Surgeon: Samson Frederic, MD;  Location: [facility] ORS;  Service: Orthopedics;  Laterality: Left;     Social History:   reports that she has never smoked. She has never used smokeless tobacco. She reports that she does not drink alcohol and does not use drugs.   Family History:  Her family history includes Cancer in her brother; Heart disease in her mother; Heart failure in her mother; Pancreatic cancer in her father.   Allergies No Known Allergies   Home Medications  Prior to Admission medications   Medication Sig Start Date End Date Taking? Authorizing Provider  acetaminophen (TYLENOL) 500 MG tablet Take 1,000 mg by mouth every 6 (six) hours as needed for mild pain or moderate pain.   Yes [provider]  Calcium Carb-Cholecalciferol (CALCIUM + VITAMIN D3 PO) Take 1 tablet by mouth daily.   Yes [provider]  Cholecalciferol (VITAMIN D-3 PO) Take 1 capsule by mouth daily.   Yes [provider]  cloBAZam (ONFI) 10 MG tablet Take 10-15 mg by mouth See admin instructions. 10 mg every morning, 15 mg every evening   Yes [provider]  doxycycline (VIBRA-TABS) 100 MG tablet Take 100 mg by mouth 2 (two) times daily. 06/07/23  Yes [provider]  lansoprazole  (PREVACID) 30 MG capsule Take 30 mg by mouth in the morning and at bedtime. 06/10/22  Yes [provider]  levothyroxine (SYNTHROID) 100 MCG tablet Take 100 mcg by mouth daily before breakfast.   Yes [provider]  Multiple Vitamins-Minerals (MULTIVITAMIN WOMEN  50+) TABS Take 1 tablet by mouth daily.   Yes [provider]  Nitrofurantoin 50 MG/5ML SUSP Take 10 mLs by mouth in the morning. 05/25/23  Yes [provider]  Pediatric Multivitamins-Iron (CHILDRENS MULTI VITAMINS/IRON PO) Take 1 tablet by mouth daily.   Yes [provider]  polyethylene glycol (MIRALAX / GLYCOLAX) packet Take 8 g by mouth in the morning.   Yes [provider]  propafenone (RYTHMOL SR) 225 MG 12 hr capsule TAKE 1 CAPSULE BY MOUTH TWICE A DAY 05/10/23  Yes Runell Gess, MD  valproic acid (DEPAKENE) 250 MG/5ML solution Take 5-10 mLs by mouth 3 (three) times daily. Take 5mL by mouth in the morning and afternoon, and take 10mL by mouth in the evening.   Yes [provider]  trimethoprim (TRIMPEX) 100 MG tablet Take 1 tablet (100 mg total) by mouth daily. Patient not taking: Reported on 06/29/2023 03/18/23   Lonia Blood, MD     Critical care time: n/a    Karrie Doffing, MS4 Rosana Berger

## 2023-06-30 NOTE — Progress Notes (Addendum)
Pt admitted last night with anasarca has become hypotensive on Lasix infusion with SBP in 70s and 80s. Plan to stop Lasix and give her albumin.

## 2023-06-30 NOTE — Progress Notes (Signed)
Pt seen for respiratory distress.   This 67 yr old female with frontotemporal dementia was brought in by family yesterday for worsening problems with aspiration over several weeks as well as increasing peripheral edema. She was admitted to the Hospitalists' service, started on Lasix infusion, and made NPO except for sips with meds.   She became hypotensive, Lasix infusion was stopped, albumin was given, and BP recovered.   She was given Depakote (home med) but appeared to aspirate. She became hypoxic with increased WOB and very weak, ineffective cough.    Rapid response was called, CXR obtained, and deep suctioning performed.   CXR appears stable to improved when compared to that from last night.   On exam, she has labored respirations, drooling, and weak cough.   There is concern that her nursing needs/airway monitoring will be too intensive for a floor bed.   Case was discussed with Dr. Francine Graven of PCCM who recommended transfer to ICU under Hospitalists' service for now. They will see the patient.

## 2023-06-30 NOTE — Progress Notes (Signed)
Ultrasound scanning performed at bedside  Small amount of fluid located on the left side  Small amount of fluid located on the right side  CT scan of the chest did reveal significant fluid bilaterally, this may be positional, not able to optimally position patient to access a safe pocket of fluid  Will continue current management and follow closely

## 2023-06-30 NOTE — ED Notes (Signed)
ED TO INPATIENT HANDOFF REPORT  ED Nurse Name and Phone #:  Johnna Acosta 47  S Name/Age/Gender [name] 67 y.o. female Room/Bed: 004C/004C  Code Status   Code Status: Full Code  Home/SNF/Other Home Patient oriented to: self Is this baseline? Yes   Triage Complete: Triage complete  Chief Complaint Anasarca [R60.1]  Triage Note Patient arrives by EMS from home with c/o increased weakness and extra swelling.   Patient is bed bound and has dementia.  Per husband patient has not been getting out of bed for past 3 days.    EMS VITALS 122/75 HR 57 CBG 86   Allergies No Known Allergies  Level of Care/Admitting Diagnosis ED Disposition     ED Disposition  Admit   Condition  --   Comment  Hospital [facility]: [facility] [[id number]]  Level of Care: ICU [6]  May admit patient to [facility] or [facility] if equivalent level of care is available:: No  Covid Evaluation: Asymptomatic - no recent exposure (last 10 days) testing not required  Diagnosis: Anasarca [[id number]]  Admitting Physician: Nolberto Hanlon [[id number]]  Attending Physician: Nolberto Hanlon [[id number]]  Bed request comments: Discussed with Dr. Francine Graven of Critical Care who asked that she be sent to the ICU under Hospitalists' service and they will see her in consultation  Certification:: I certify this patient will need inpatient services for at least 2 midnights          B Medical/Surgery History Past Medical History:  Diagnosis Date   Altered mental status 01/18/2023   Arthritis lower back/hands   Atypical chest pain 04/15/2017   Atypical chest pain   Brain injury (HCC)    Cancer (HCC) breast tumor removed   COVID-19 02/02/2023   Dementia (HCC)    PSD-FTD   Disorientation 01/18/2023   Hip fracture (HCC) 09/06/2019   History of epilepsy 02/02/2023   Leg fracture    Seizure disorder (HCC)    Seizures (HCC)    Sepsis (HCC) 01/11/2023   VT (ventricular tachycardia) (HCC) 04/29/2017   Past  Surgical History:  Procedure Laterality Date   BREAST SURGERY     FACIAL FRACTURE SURGERY  orbital bone repair   LEFT HEART CATH AND CORONARY ANGIOGRAPHY N/A 04/30/2017   Procedure: Left Heart Cath and Coronary Angiography;  Surgeon: Marykay Lex, MD;  Location: [facility] INVASIVE CV LAB;  Service: Cardiovascular;  Laterality: N/A;   TOTAL HIP ARTHROPLASTY Left 09/07/2019   Procedure: TOTAL HIP ARTHROPLASTY ANTERIOR APPROACH;  Surgeon: Samson Frederic, MD;  Location: [facility] ORS;  Service: Orthopedics;  Laterality: Left;     A IV Location/Drains/Wounds Patient Lines/Drains/Airways Status     Active Line/Drains/Airways     Name Placement date Placement time Site Days   Peripheral IV 06/29/23 20 G 2.5" Right;Anterior;Lateral Forearm 06/29/23  1641  Forearm  1   Wound / Incision (Open or Dehisced) 02/03/23 Skin tear Coccyx Right;Left Pt has 2 open blisters on Left and Right Coccyx area, blanchable, 02/03/23  1209  Coccyx  147            Intake/Output Last 24 hours  Intake/Output Summary (Last 24 hours) at 06/30/2023 0403 Last data filed at 06/30/2023 0213 Gross per 24 hour  Intake 986.84 ml  Output --  Net 986.84 ml    Labs/Imaging Results for orders placed or performed during the hospital encounter of 06/29/23 (from the past 48 hour(s))  I-Stat arterial blood gas, ED ([facility], [facility], [facility])  Status: Abnormal   Collection Time: 06/29/23  3:00 PM  Result Value Ref Range   pH, Arterial 7.392 7.35 - 7.45   pCO2 arterial 64.5 (H) 32 - 48 mmHg   pO2, Arterial 63 (L) 83 - 108 mmHg   Bicarbonate 39.2 (H) 20.0 - 28.0 mmol/L   TCO2 41 (H) 22 - 32 mmol/L   O2 Saturation 91 %   Acid-Base Excess 12.0 (H) 0.0 - 2.0 mmol/L   Sodium 128 (L) 135 - 145 mmol/L   Potassium 4.0 3.5 - 5.1 mmol/L   Calcium, Ion 1.25 1.15 - 1.40 mmol/L   HCT 29.0 (L) 36.0 - 46.0 %   Hemoglobin 9.9 (L) 12.0 - 15.0 g/dL   Patient temperature 16.1 F    Collection site RADIAL, ALLEN'S TEST ACCEPTABLE    Drawn by RT    Sample  type ARTERIAL   Comprehensive metabolic panel     Status: Abnormal   Collection Time: 06/29/23  3:59 PM  Result Value Ref Range   Sodium 131 (L) 135 - 145 mmol/L   Potassium 4.2 3.5 - 5.1 mmol/L   Chloride 87 (L) 98 - 111 mmol/L   CO2 36 (H) 22 - 32 mmol/L   Glucose, Bld 74 70 - 99 mg/dL    Comment: Glucose reference range applies only to samples taken after fasting for at least 8 hours.   BUN 7 (L) 8 - 23 mg/dL   Creatinine, Ser 0.96 0.44 - 1.00 mg/dL   Calcium 9.0 8.9 - 04.5 mg/dL   Total Protein 5.9 (L) 6.5 - 8.1 g/dL   Albumin 2.4 (L) 3.5 - 5.0 g/dL   AST 21 15 - 41 U/L   ALT 14 0 - 44 U/L   Alkaline Phosphatase 69 38 - 126 U/L   Total Bilirubin 0.5 0.3 - 1.2 mg/dL   GFR, Estimated >40 >98 mL/min    Comment: (NOTE) Calculated using the CKD-EPI Creatinine Equation (2021)    Anion gap 8 5 - 15    Comment: Performed at [facility] Lab, [address], [place], [place] [address]  CBC with Differential     Status: Abnormal   Collection Time: 06/29/23  3:59 PM  Result Value Ref Range   WBC 2.7 (L) 4.0 - 10.5 K/uL   RBC 3.41 (L) 3.87 - 5.11 MIL/uL   Hemoglobin 10.9 (L) 12.0 - 15.0 g/dL   HCT 78.2 (L) 95.6 - 21.3 %   MCV 95.3 80.0 - 100.0 fL   MCH 32.0 26.0 - 34.0 pg   MCHC 33.5 30.0 - 36.0 g/dL   RDW 08.6 (H) 57.8 - 46.9 %   Platelets 63 (L) 150 - 400 K/uL    Comment: SPECIMEN CHECKED FOR CLOTS Immature Platelet Fraction may be clinically indicated, consider ordering this additional test [id number] REPEATED TO VERIFY PLATELET COUNT CONFIRMED BY SMEAR    nRBC 0.0 0.0 - 0.2 %   Neutrophils Relative % 57 %   Neutro Abs 1.6 (L) 1.7 - 7.7 K/uL   Lymphocytes Relative 35 %   Lymphs Abs 1.0 0.7 - 4.0 K/uL   Monocytes Relative 6 %   Monocytes Absolute 0.2 0.1 - 1.0 K/uL   Eosinophils Relative 1 %   Eosinophils Absolute 0.0 0.0 - 0.5 K/uL   Basophils Relative 0 %   Basophils Absolute 0.0 0.0 - 0.1 K/uL   Immature Granulocytes 1 %   Abs Immature Granulocytes 0.03 0.00 -  0.07 K/uL    Comment: Performed at [facility] Lab, [address] [place], [place] [address]  Protime-INR     Status: None   Collection Time: 06/29/23  3:59 PM  Result Value Ref Range   Prothrombin Time 13.3 11.4 - 15.2 seconds   INR 1.0 0.8 - 1.2    Comment: (NOTE) INR goal varies based on device and disease states. Performed at [facility] Lab, [address], [place], [place] [address]   APTT     Status: Abnormal   Collection Time: 06/29/23  3:59 PM  Result Value Ref Range   aPTT 23 (L) 24 - 36 seconds    Comment: Performed at [facility] Lab, [address], [place], [place] [address]  Brain natriuretic peptide     Status: Abnormal   Collection Time: 06/29/23  3:59 PM  Result Value Ref Range   B Natriuretic Peptide 198.6 (H) 0.0 - 100.0 pg/mL    Comment: Performed at [facility] Lab, [address] [place], [place] [address]  Ammonia     Status: Abnormal   Collection Time: 06/29/23  4:37 PM  Result Value Ref Range   Ammonia 41 (H) 9 - 35 umol/L    Comment: Performed at [facility] Lab, [address], [place], [place] [address]  Urinalysis, w/ Reflex to Culture (Infection Suspected) -Urine, Catheterized     Status: Abnormal   Collection Time: 06/29/23  7:15 PM  Result Value Ref Range   Specimen Source URINE, CATHETERIZED    Color, Urine YELLOW YELLOW   APPearance CLEAR CLEAR   Specific Gravity, Urine 1.004 (L) 1.005 - 1.030   pH 7.0 5.0 - 8.0   Glucose, UA NEGATIVE NEGATIVE mg/dL   Hgb urine dipstick NEGATIVE NEGATIVE   Bilirubin Urine NEGATIVE NEGATIVE   Ketones, ur NEGATIVE NEGATIVE mg/dL   Protein, ur NEGATIVE NEGATIVE mg/dL   Nitrite NEGATIVE NEGATIVE   Leukocytes,Ua NEGATIVE NEGATIVE   RBC / HPF 0-5 0 - 5 RBC/hpf   WBC, UA 0-5 0 - 5 WBC/hpf    Comment:        Reflex urine culture not performed if WBC <=10, OR if Squamous epithelial cells >5. If Squamous epithelial cells >5 suggest recollection.    Bacteria, UA NONE SEEN NONE SEEN    Squamous Epithelial / HPF 0-5 0 - 5 /HPF    Comment: Performed at [facility] Lab, [address] [place], [place] [address]  Lactic acid, plasma     Status: None   Collection Time: 06/29/23  8:21 PM  Result Value Ref Range   Lactic Acid, Venous 0.8 0.5 - 1.9 mmol/L    Comment: Performed at [facility] Lab, [address] [place], [place] [address]  Hemoglobin A1c     Status: None   Collection Time: 06/29/23 10:52 PM  Result Value Ref Range   Hgb A1c MFr Bld 5.0 4.8 - 5.6 %    Comment: (NOTE) Pre diabetes:          5.7%-6.4%  Diabetes:              >6.4%  Glycemic control for   <7.0% adults with diabetes    Mean Plasma Glucose 96.8 mg/dL    Comment: Performed at [facility] Lab, [address] [place] [place], [place] [address]  Type and screen [facility]     Status: None   Collection Time: 06/30/23 12:54 AM  Result Value Ref Range   ABO/RH(D) O NEG  Antibody Screen NEG    Sample Expiration      07/03/2023,2359 Performed at [facility] Lab, [address], [place], [place] [address]   Lactic acid, plasma     Status: None   Collection Time: 06/30/23 12:56 AM  Result Value Ref Range   Lactic Acid, Venous 0.9 0.5 - 1.9 mmol/L    Comment: Performed at [facility] Lab, [address], [place], [place] [address]  Basic metabolic panel     Status: Abnormal   Collection Time: 06/30/23 12:56 AM  Result Value Ref Range   Sodium 133 (L) 135 - 145 mmol/L   Potassium 4.0 3.5 - 5.1 mmol/L   Chloride 91 (L) 98 - 111 mmol/L   CO2 35 (H) 22 - 32 mmol/L   Glucose, Bld 82 70 - 99 mg/dL    Comment: Glucose reference range applies only to samples taken after fasting for at least 8 hours.   BUN 5 (L) 8 - 23 mg/dL   Creatinine, Ser 0.98 0.44 - 1.00 mg/dL   Calcium 8.6 (L) 8.9 - 10.3 mg/dL   GFR, Estimated >11 >91 mL/min    Comment: (NOTE) Calculated using the CKD-EPI Creatinine Equation (2021)    Anion gap 7 5 - 15    Comment: Performed at [facility] Lab, [address] [place], [place] [address]  Vitamin B12     Status: Abnormal   Collection Time: 06/30/23 12:56 AM  Result Value Ref Range   Vitamin B-12 1,252 (H) 180 - 914 pg/mL    Comment: (NOTE) This assay is not validated for testing neonatal or myeloproliferative syndrome specimens for Vitamin B12 levels. Performed at [facility] Lab, [address], [place], [place] [address]   Folate     Status: None   Collection Time: 06/30/23 12:56 AM  Result Value Ref Range   Folate 18.6 >5.9 ng/mL    Comment: Performed at [facility] Lab, [address], [place], [place] [address]  Iron and TIBC     Status: None   Collection Time: 06/30/23 12:56 AM  Result Value Ref Range   Iron 66 28 - 170 ug/dL   TIBC 846 962 - 952 ug/dL   Saturation Ratios 22 10.4 - 31.8 %   UIBC 235 ug/dL    Comment: Performed at [facility] Lab, [address] [place], [place] [address]  Ferritin     Status: None   Collection Time: 06/30/23 12:56 AM  Result Value Ref Range   Ferritin 228 11 - 307 ng/mL    Comment: Performed at [facility] Lab, [address], [place], [place] [address]  Reticulocytes     Status: Abnormal   Collection Time: 06/30/23 12:56 AM  Result Value Ref Range   Retic Ct Pct 4.0 (H) 0.4 - 3.1 %   RBC. 3.01 (L) 3.87 - 5.11 MIL/uL   Retic Count, Absolute 120.7 19.0 - 186.0 K/uL   Immature Retic Fract 13.9 2.3 - 15.9 %    Comment: Performed at [facility] Lab, [address] [place], [place] [address]  Troponin I (High Sensitivity)     Status: None   Collection Time: 06/30/23 12:56 AM  Result Value Ref Range   Troponin I (High Sensitivity) 4 <18 ng/L    Comment: (NOTE) Elevated high sensitivity troponin I (hsTnI) values and significant  changes across serial measurements may suggest ACS but many other  chronic and acute conditions are known to  elevate hsTnI results.  Refer to the "Links" section for chest pain algorithms and additional  guidance. Performed at  [facility] Lab, [address] [place], [place] [address]   Magnesium     Status: Abnormal   Collection Time: 06/30/23 12:56 AM  Result Value Ref Range   Magnesium 1.5 (L) 1.7 - 2.4 mg/dL    Comment: Performed at [facility] Lab, [address], [place], [place] [address]  APTT     Status: None   Collection Time: 06/30/23  1:49 AM  Result Value Ref Range   aPTT 31 24 - 36 seconds    Comment: Performed at [facility] Lab, [address] [place], [place] [address]  Protime-INR     Status: None   Collection Time: 06/30/23  1:49 AM  Result Value Ref Range   Prothrombin Time 13.8 11.4 - 15.2 seconds   INR 1.0 0.8 - 1.2    Comment: (NOTE) INR goal varies based on device and disease states. Performed at [facility] Lab, [address] [place], [place] [address]   Basic metabolic panel     Status: Abnormal   Collection Time: 06/30/23  1:49 AM  Result Value Ref Range   Sodium 134 (L) 135 - 145 mmol/L   Potassium 4.0 3.5 - 5.1 mmol/L   Chloride 90 (L) 98 - 111 mmol/L   CO2 35 (H) 22 - 32 mmol/L   Glucose, Bld 80 70 - 99 mg/dL    Comment: Glucose reference range applies only to samples taken after fasting for at least 8 hours.   BUN 5 (L) 8 - 23 mg/dL   Creatinine, Ser 2.95 0.44 - 1.00 mg/dL   Calcium 8.8 (L) 8.9 - 10.3 mg/dL   GFR, Estimated >62 >13 mL/min    Comment: (NOTE) Calculated using the CKD-EPI Creatinine Equation (2021)    Anion gap 9 5 - 15    Comment: Performed at [facility] Lab, [address] [place], [place] [address]  CBC     Status: Abnormal   Collection Time: 06/30/23  1:49 AM  Result Value Ref Range   WBC 3.0 (L) 4.0 - 10.5 K/uL   RBC 3.08 (L) 3.87 - 5.11 MIL/uL   Hemoglobin 9.8 (L) 12.0 - 15.0 g/dL   HCT 84.6 (L) 96.2 - 95.2 %   MCV 95.5 80.0 - 100.0 fL   MCH 31.8 26.0 - 34.0 pg   MCHC 33.3 30.0 - 36.0 g/dL   RDW 84.1 (H) 32.4 - 40.1 %   Platelets 68 (L) 150 - 400 K/uL    Comment: Immature Platelet Fraction may be clinically  indicated, consider ordering this additional test [id number]    nRBC 0.0 0.0 - 0.2 %    Comment: Performed at [facility] Lab, [address] [place], [place] [address]  Magnesium     Status: Abnormal   Collection Time: 06/30/23  1:49 AM  Result Value Ref Range   Magnesium 1.6 (L) 1.7 - 2.4 mg/dL    Comment: Performed at [facility] Lab, [address] [place], [place] [address]   DG Chest Port 1 View  Result Date: 06/30/2023 CLINICAL DATA:  Acute respiratory distress EXAM: PORTABLE CHEST 1 VIEW COMPARISON:  06/29/2023 FINDINGS: Cardiac shadow is stable. Bilateral pleural effusions are noted left greater than right similar to that seen prior exam. No new focal abnormality is noted. IMPRESSION: Stable bilateral effusions when compared with the previous day. Electronically Signed  By: Alcide Clever M.D.   On: 06/30/2023 03:30   CT Angio Chest PE W/Cm &/Or Wo Cm  Result Date: 06/29/2023 CLINICAL DATA:  Concern for pulmonary embolism. EXAM: CT ANGIOGRAPHY CHEST WITH CONTRAST TECHNIQUE: Multidetector CT imaging of the chest was performed using the standard protocol during bolus administration of intravenous contrast. Multiplanar CT image reconstructions and MIPs were obtained to evaluate the vascular anatomy. RADIATION DOSE REDUCTION: This exam was performed according to the departmental dose-optimization program which includes automated exposure control, adjustment of the mA and/or kV according to patient size and/or use of iterative reconstruction technique. CONTRAST:  75mL OMNIPAQUE IOHEXOL 350 MG/ML SOLN COMPARISON:  Chest CT dated 03/12/2023. FINDINGS: Evaluation is limited due to respiratory motion and streak artifact caused by patient's arms. Cardiovascular: There is no cardiomegaly or pericardial effusion. Mild atherosclerotic calcification of the thoracic aorta. No aneurysmal dilatation. Evaluation of the pulmonary arteries is limited due to factors above. No pulmonary artery embolus  identified. Mediastinum/Nodes: Subcarinal lymph node measures 16 mm short axis. No obvious hilar adenopathy. Evaluation of the hilar lymph nodes however is limited due to consolidative changes of the lungs. The esophagus is grossly unremarkable. No mediastinal fluid collection. Lungs/Pleura: Small bilateral pleural effusions, significantly increased since the prior CT. There is complete consolidation of the left lower lobe with partial consolidation of the right lower lobe which may represent atelectasis or pneumonia. Underlying mass is not excluded clinical correlation and follow-up to resolution recommended. There is mosaic attenuation of the aerated lung which may represent air trapping and related to underlying small airways versus small vessel disease. There is no pneumothorax. High-grade narrowing of the bilateral lower lobe bronchi, possibly related to mucous impaction. Aspiration is not excluded. Upper Abdomen: No acute abnormality. Musculoskeletal: Osteopenia with degenerative changes of the spine. Several old thoracic spine compression fractures. No acute osseous pathology. Review of the MIP images confirms the above findings. IMPRESSION: 1. No CT evidence of pulmonary embolism. 2. Small bilateral pleural effusions, significantly increased since the prior CT. 3. Complete consolidation of the left lower lobe with partial consolidation of the right lower lobe which may represent atelectasis or pneumonia. Underlying mass is not excluded clinical correlation and follow-up to resolution recommended. 4.  Aortic Atherosclerosis (ICD10-I70.0). Electronically Signed   By: Elgie Collard M.D.   On: 06/29/2023 19:46   CT Head Wo Contrast  Result Date: 06/29/2023 CLINICAL DATA:  Mental status change, unknown cause EXAM: CT HEAD WITHOUT CONTRAST TECHNIQUE: Contiguous axial images were obtained from the base of the skull through the vertex without intravenous contrast. RADIATION DOSE REDUCTION: This exam was  performed according to the departmental dose-optimization program which includes automated exposure control, adjustment of the mA and/or kV according to patient size and/or use of iterative reconstruction technique. COMPARISON:  None Available. FINDINGS: Brain: There is atrophy and chronic small vessel disease changes. No acute intracranial abnormality. Specifically, no hemorrhage, hydrocephalus, mass lesion, acute infarction, or significant intracranial injury. Vascular: No hyperdense vessel or unexpected calcification. Skull: No acute calvarial abnormality. Sinuses/Orbits: Mucosal thickening within the left maxillary sinus, ethmoid air cells and bilateral frontal sinuses. Air-fluid level in the sphenoid sinuses. Other: None IMPRESSION: Atrophy, chronic microvascular disease. No acute intracranial abnormality. Acute on chronic sinusitis. Electronically Signed   By: Charlett Nose M.D.   On: 06/29/2023 19:41   DG Chest Port 1 View  Result Date: 06/29/2023 CLINICAL DATA:  Questionable sepsis EXAM: PORTABLE CHEST 1 VIEW COMPARISON:  X-ray and CT 03/12/2023 FINDINGS: Increasing bilateral small to  moderate pleural effusions with adjacent opacities. No pneumothorax. Film is rotated to the left. Enlarged cardiopericardial silhouette. No edema. Degenerative changes of the spine. IMPRESSION: Developing small to moderate pleural effusions. Adjacent lung opacities. Recommend follow-up Electronically Signed   By: Karen Kays M.D.   On: 06/29/2023 16:28    Pending Labs Unresulted Labs (From admission, onward)     Start     Ordered   07/06/23 0500  Creatinine, serum  (enoxaparin (LOVENOX)    CrCl >/= 30 ml/min)  Weekly,   R     Comments: while on enoxaparin therapy    06/30/23 0132   06/30/23 0500  Protein electrophoresis, serum  Tomorrow morning,   R        06/29/23 2225   06/29/23 2159  Protein / creatinine ratio, urine  Once,   URGENT        06/29/23 2159   06/29/23 2159  Urinalysis, Complete w Microscopic  -Urine, Catheterized  Once,   URGENT       Question:  Specimen Source  Answer:  Urine, Catheterized   06/29/23 2159   06/29/23 1438  Blood Culture (routine x 2)  (Undifferentiated presentation (screening labs and basic nursing orders))  BLOOD CULTURE X 2,   STAT      06/29/23 1438            Vitals/Pain Today's Vitals   06/30/23 0300 06/30/23 0310 06/30/23 0320 06/30/23 0330  BP:  126/75 110/76 101/86  Pulse: 75 81 79 77  Resp: 13 (!) 23 16 18   Temp:      TempSrc:      SpO2: 94% 91% 92% 95%  Weight:      Height:        Isolation Precautions No active isolations  Medications Medications  azithromycin (ZITHROMAX) 500 mg in sodium chloride 0.9 % 250 mL IVPB (0 mg Intravenous Stopped 06/29/23 2315)  insulin aspart (novoLOG) injection 0-15 Units (has no administration in time range)  insulin aspart (novoLOG) injection 0-5 Units ( Subcutaneous Not Given 06/29/23 2259)  Ampicillin-Sulbactam (UNASYN) 3 g in sodium chloride 0.9 % 100 mL IVPB (0 g Intravenous Stopped 06/29/23 2314)  propafenone (RYTHMOL SR) 12 hr capsule 225 mg (has no administration in time range)  cloBAZam (ONFI) tablet 10 mg (has no administration in time range)  pantoprazole (PROTONIX) EC tablet 20 mg (has no administration in time range)  levothyroxine (SYNTHROID) tablet 100 mcg (has no administration in time range)  valproic acid (DEPAKENE) 250 MG/5ML solution 250 mg (has no administration in time range)  enoxaparin (LOVENOX) injection 40 mg (has no administration in time range)  acetaminophen (TYLENOL) tablet 650 mg (has no administration in time range)    Or  acetaminophen (TYLENOL) suppository 650 mg (has no administration in time range)  sodium chloride flush (NS) 0.9 % injection 3 mL (3 mLs Intravenous Given 06/30/23 0244)  valproic acid (DEPAKENE) 250 MG/5ML solution 500 mg (500 mg Oral Given 06/30/23 0246)  cloBAZam (ONFI) tablet 15 mg (15 mg Oral Not Given 06/30/23 0320)  iohexol (OMNIPAQUE) 350 MG/ML injection  75 mL (75 mLs Intravenous Contrast Given 06/29/23 1935)  cefTRIAXone (ROCEPHIN) 1 g in sodium chloride 0.9 % 100 mL IVPB (0 g Intravenous Stopped 06/29/23 2105)  sodium chloride 0.9 % bolus 500 mL (0 mLs Intravenous Stopped 06/29/23 2124)  albumin human 25 % solution 25 g (0 g Intravenous Stopped 06/30/23 0213)    Mobility non-ambulatory     Focused Assessments Neuro Assessment Handoff:  Swallow screen pass? No  Cardiac Rhythm: Normal sinus rhythm       Neuro Assessment: Exceptions to WDL Neuro Checks:      Has TPA been given? No If patient is a Neuro Trauma and patient is going to OR before floor call report to 4N Charge nurse: [phone] or [phone]   R Recommendations: See Admitting Provider Note  Report given to:   Additional Notes:

## 2023-06-30 NOTE — Progress Notes (Signed)
Current I.V. R ant FA marked for vasopressor use.GBR noted.

## 2023-06-30 NOTE — Progress Notes (Addendum)
Initial Nutrition Assessment  DOCUMENTATION CODES:   Not applicable  INTERVENTION:  Continue current diet as ordered per SLP Nectar Thick Mighty Shake TID, each supplement provides 330kcal and 9g protein  Nepro Shake po TID, each supplement provides 425 kcal and 19 grams protein for nectar thick shake option If pt unable to meet nutrition needs, place cortrak tube 7/5 and initiate enteral feeds  NUTRITION DIAGNOSIS:   Inadequate oral intake related to lethargy/confusion as evidenced by per patient/family report.  GOAL:   Patient will meet greater than or equal to 90% of their needs  MONITOR:   PO intake, Diet advancement, Labs, Weight trends, I & O's  REASON FOR ASSESSMENT:   Consult Assessment of nutrition requirement/status  ASSESSMENT:   Pt with hx of dementia, epilepsy, and supranuclear palsy presented to ED with worsening respiratory status and anasarca.  Pt resting in bed at the time of assessment. Husband at bedside. Pt made NPO last night and SLP able to evaluate this AM. Advanced to full liquid diet at nectar thick consistency.   Husband reports that pt has been more lethargic than usual over the last few weeks and this has contributed to her PO intake. Does endorse some weight loss ~ 10 lbs during this time frame but unsure how much is related to changes in fluid. Pt currently with significant edema to the BLE and hands.   Pt's husband plans to be present at all meals to assist with feeding. Very well versed on swallowing and feeding precautions. Did discuss nutrition concerns with husband that with pt's increased lethargy and restricted diet she would have a hard time meeting her needs. Did discuss the possibility of cortrak tube placement to ensure that needs are met with pt works on building up her stamina. Since diet was able to be advanced, husband would like to give pt a chance over the next 24-48 hours to determine if she will be able to meet needs. If not, will  proceed with cortrak placement on Friday. In the meantime, did discuss nutrition supplements to augment intake. Pt does not like very cold food items, but is agreeable to [employer] and Nepro.   Discussed with RN and CCM.   Intake/Output Summary (Last 24 hours) at 06/30/2023 1146 Last data filed at 06/30/2023 0800 Gross per 24 hour  Intake 1500.83 ml  Output 200 ml  Net 1300.83 ml  Net IO Since Admission: 1,300.83 mL [06/30/23 1146]  Nutritionally Relevant Medications: Scheduled Meds:  insulin aspart  0-15 Units Subcutaneous TID WC   insulin aspart  0-5 Units Subcutaneous QHS   pantoprazole  20 mg Oral Daily   Continuous Infusions:  ampicillin-sulbactam (UNASYN) IV Stopped (06/30/23 0546)   azithromycin Stopped (06/29/23 2228)   norepinephrine (LEVOPHED) Adult infusion Stopped (06/30/23 0759)   Labs Reviewed: Na 134, chloride 90 BUN 5 CBG ranges from 71-101 mg/dL over the last 24 hours HgbA1c 5% (7/2)  NUTRITION - FOCUSED PHYSICAL EXAM: Flowsheet Row Most Recent Value  Orbital Region No depletion  Upper Arm Region No depletion  Thoracic and Lumbar Region No depletion  Buccal Region No depletion  Temple Region No depletion  Clavicle Bone Region No depletion  Clavicle and Acromion Bone Region No depletion  Scapular Bone Region No depletion  Dorsal Hand Unable to assess  [edema]  Patellar Region Unable to assess  Anterior Thigh Region Unable to assess  Posterior Calf Region Unable to assess  Edema (RD Assessment) Moderate  [significant edema to the BLE and the  hands]  Hair Reviewed  Eyes Reviewed  Mouth Reviewed  Skin Reviewed  Nails Reviewed   Diet Order:   Diet Order             Diet full liquid Room service appropriate? Yes; Fluid consistency: Nectar Thick  Diet effective now                   EDUCATION NEEDS:   Not appropriate for education at this time  Skin:  Skin Assessment: Reviewed RN Assessment IAD - 20 cm x 20 cm   Last BM:  7/1  Height:    Ht Readings from Last 1 Encounters:  06/29/23 5\' 6"  (1.676 m)    Weight:   Wt Readings from Last 1 Encounters:  06/30/23 90.8 kg    Ideal Body Weight:  59.1 kg  BMI:  Body mass index is 32.31 kg/m.  Estimated Nutritional Needs:   Kcal:  1700-1900 kcal/d  Protein:  80-100 g/d  Fluid:  1.8L/d    Greig Castilla, RD, LDN Clinical Dietitian RD pager # available in AMION  After hours/weekend pager # available in [facility]

## 2023-06-30 NOTE — Progress Notes (Signed)
Lower extremity venous bilateral study completed.   Please see CV Proc for preliminary results.   Fraidy Mccarrick, RDMS, RVT  

## 2023-06-30 NOTE — Consult Note (Signed)
NAME:  [name], MRN:  [id number], DOB:  1956/06/04, LOS: 1 ADMISSION DATE:  06/29/2023, CONSULTATION DATE:  06/30/23 REFERRING MD:  Odie Sera, MD CHIEF COMPLAINT:  Aspiration   History of Present Illness:  [name] is a 67 year old woman with frontotemporal dementia who was brought in to the ER yesterday for an increase in home O2 needs from 1 to 2L along with cough and trouble swallowing her food/drink. Her husband is her primary care taker and reports increase in lower extremity edema over the past 1 week. She has had progressive decline over recent months with concern for supranuclear palsy by her outpatient neurology team. Unfortunately she has had multiple hospital admissions over recent months with difficulty making the outpatient neurology appointments.  She was admitted yesterday by the hospitalist team and placed on a lasix drip for anasarca, aspiration and failure to thrive.. She developed hypotension earlier this morning in which lasix drip was stopped and given albumin with good effect on blood pressure. She was given PO liquid medication in the ER after passing her bedside swallow eval and then demonstrated concern for aspiration with cough and poor ability to clear her throat. She developed increased work of breathing, increased O2 need and rapid response was called. Patient responded to NT suctioning with improvement of her work of breathing but continued to require frequent suctioning. She was weaned to 6L [place].   PCCM consulted for respiratory failure and aspiration. Recommended patient be admitted to the ICU for increased level of nursing and RT needs for frequent suctioning.   Pertinent  Medical History   Past Medical History:  Diagnosis Date   Altered mental status 01/18/2023   Arthritis lower back/hands   Atypical chest pain 04/15/2017   Atypical chest pain   Brain injury (HCC)    Cancer (HCC) breast tumor removed   COVID-19 02/02/2023   Dementia (HCC)    PSD-FTD    Disorientation 01/18/2023   Hip fracture (HCC) 09/06/2019   History of epilepsy 02/02/2023   Leg fracture    Seizure disorder (HCC)    Seizures (HCC)    Sepsis (HCC) 01/11/2023   VT (ventricular tachycardia) (HCC) 04/29/2017   Significant Hospital Events: Including procedures, antibiotic start and stop dates in addition to other pertinent events   7/2 admitted to hospital 7/3 PCCM consult - hypoxia, aspiration  Interim History / Subjective:  As above  Objective   Blood pressure (!) 133/99, pulse 77, temperature (!) 95.5 F (35.3 C), temperature source Rectal, resp. rate 18, height 5\' 6"  (1.676 m), weight 93 kg, SpO2 93 %.        Intake/Output Summary (Last 24 hours) at 06/30/2023 0421 Last data filed at 06/30/2023 0213 Gross per 24 hour  Intake 986.84 ml  Output --  Net 986.84 ml   Filed Weights   06/29/23 2157  Weight: 93 kg    Examination: General: elderly woman, mild distress, laying in bed HENT: [place]/AT, moist mucous membranes, nasal canula in place Lungs: diminshed breath sounds, no wheezing, accessory muscle use Cardiovascular: rrr, no murmurs Abdomen: soft, non-distended, BS+ Extremities: warm, edema + Neuro: awake, follows intermittent commands GU: n/a  Resolved Hospital Problem list     Assessment & Plan:  Acute on chronic hypoxemic respiratory failure Aspiration Pneumonia Bilateral Pleural Effusions - in setting of moderate oropharyngeal dysphagia and mild cervical esophageal phase dysphagia per MBS 12/2022 - She has generalized anasarca and third spacing leading to bilateral pleural effusions - agree with Lucianne Lei  antibiotic coverage and azithromycin - Wean supplemental O2 for SpO2 92% or greater - PRN NT suctioning - Chest PT via bed - hypertonic saline nebs BID and duonebs q6hrs - strict NPO, convert all meds to IV or place NG tube - will consider if bronch is helpful to evaluate upper airway  Frontotemporal Dementia Concern for supranuclear  palsy - agree with inpatient neurology evaluation along with palliative care consult  Pancytopenia - new since 02/2023  - B12 WNL - protein electrophoresis ordered - monitor  Anasarca - lasix as tolerated - agree with albumin  Moderate protein calorie malnutrition - consider doboff placement for tube feeds  Best Practice (right click and "Reselect all SmartList Selections" daily)   Per primary team  Labs   CBC: Recent Labs  Lab 06/29/23 1500 06/29/23 1559 06/30/23 0149  WBC  --  2.7* 3.0*  NEUTROABS  --  1.6*  --   HGB 9.9* 10.9* 9.8*  HCT 29.0* 32.5* 29.4*  MCV  --  95.3 95.5  PLT  --  63* 68*    Basic Metabolic Panel: Recent Labs  Lab 06/29/23 1500 06/29/23 1559 06/30/23 0056 06/30/23 0149  NA 128* 131* 133* 134*  K 4.0 4.2 4.0 4.0  CL  --  87* 91* 90*  CO2  --  36* 35* 35*  GLUCOSE  --  74 82 80  BUN  --  7* 5* 5*  CREATININE  --  0.83 0.72 0.72  CALCIUM  --  9.0 8.6* 8.8*  MG  --   --  1.5* 1.6*   GFR: Estimated Creatinine Clearance: 78.4 mL/min (by C-G formula based on SCr of 0.72 mg/dL). Recent Labs  Lab 06/29/23 1559 06/29/23 2021 06/30/23 0056 06/30/23 0149  WBC 2.7*  --   --  3.0*  LATICACIDVEN  --  0.8 0.9  --     Liver Function Tests: Recent Labs  Lab 06/29/23 1559  AST 21  ALT 14  ALKPHOS 69  BILITOT 0.5  PROT 5.9*  ALBUMIN 2.4*   No results for input(s): "LIPASE", "AMYLASE" in the last 168 hours. Recent Labs  Lab 06/29/23 1637  AMMONIA 41*    ABG    Component Value Date/Time   PHART 7.392 06/29/2023 1500   PCO2ART 64.5 (H) 06/29/2023 1500   PO2ART 63 (L) 06/29/2023 1500   HCO3 39.2 (H) 06/29/2023 1500   TCO2 41 (H) 06/29/2023 1500   O2SAT 91 06/29/2023 1500     Coagulation Profile: Recent Labs  Lab 06/29/23 1559 06/30/23 0149  INR 1.0 1.0    Cardiac Enzymes: No results for input(s): "CKTOTAL", "CKMB", "CKMBINDEX", "TROPONINI" in the last 168 hours.  HbA1C: Hgb A1c MFr Bld  Date/Time Value Ref Range  Status  06/29/2023 10:52 PM 5.0 4.8 - 5.6 % Final    Comment:    (NOTE) Pre diabetes:          5.7%-6.4%  Diabetes:              >6.4%  Glycemic control for   <7.0% adults with diabetes     CBG: No results for input(s): "GLUCAP" in the last 168 hours.  Review of Systems:   As above in HPI  Past Medical History:  She,  has a past medical history of Altered mental status (01/18/2023), Arthritis (lower back/hands), Atypical chest pain (04/15/2017), Brain injury (HCC), Cancer (HCC) (breast tumor removed), COVID-19 (02/02/2023), Dementia (HCC), Disorientation (01/18/2023), Hip fracture (HCC) (09/06/2019), History of epilepsy (02/02/2023), Leg fracture, Seizure disorder (  HCC), Seizures (HCC), Sepsis (HCC) (01/11/2023), and VT (ventricular tachycardia) (HCC) (04/29/2017).   Surgical History:   Past Surgical History:  Procedure Laterality Date   BREAST SURGERY     FACIAL FRACTURE SURGERY  orbital bone repair   LEFT HEART CATH AND CORONARY ANGIOGRAPHY N/A 04/30/2017   Procedure: Left Heart Cath and Coronary Angiography;  Surgeon: Marykay Lex, MD;  Location: [facility] INVASIVE CV LAB;  Service: Cardiovascular;  Laterality: N/A;   TOTAL HIP ARTHROPLASTY Left 09/07/2019   Procedure: TOTAL HIP ARTHROPLASTY ANTERIOR APPROACH;  Surgeon: Samson Frederic, MD;  Location: [facility] ORS;  Service: Orthopedics;  Laterality: Left;     Social History:   reports that she has never smoked. She has never used smokeless tobacco. She reports that she does not drink alcohol and does not use drugs.   Family History:  Her family history includes Cancer in her brother; Heart disease in her mother; Heart failure in her mother; Pancreatic cancer in her father.   Allergies No Known Allergies   Home Medications  Prior to Admission medications   Medication Sig Start Date End Date Taking? Authorizing Provider  acetaminophen (TYLENOL) 500 MG tablet Take 1,000 mg by mouth every 6 (six) hours as needed for mild pain or  moderate pain.   Yes [provider]  Calcium Carb-Cholecalciferol (CALCIUM + VITAMIN D3 PO) Take 1 tablet by mouth daily.   Yes [provider]  Cholecalciferol (VITAMIN D-3 PO) Take 1 capsule by mouth daily.   Yes [provider]  cloBAZam (ONFI) 10 MG tablet Take 10-15 mg by mouth See admin instructions. 10 mg every morning, 15 mg every evening   Yes [provider]  doxycycline (VIBRA-TABS) 100 MG tablet Take 100 mg by mouth 2 (two) times daily. 06/07/23  Yes [provider]  lansoprazole (PREVACID) 30 MG capsule Take 30 mg by mouth in the morning and at bedtime. 06/10/22  Yes [provider]  levothyroxine (SYNTHROID) 100 MCG tablet Take 100 mcg by mouth daily before breakfast.   Yes [provider]  Multiple Vitamins-Minerals (MULTIVITAMIN WOMEN 50+) TABS Take 1 tablet by mouth daily.   Yes [provider]  Nitrofurantoin 50 MG/5ML SUSP Take 10 mLs by mouth in the morning. 05/25/23  Yes [provider]  Pediatric Multivitamins-Iron (CHILDRENS MULTI VITAMINS/IRON PO) Take 1 tablet by mouth daily.   Yes [provider]  polyethylene glycol (MIRALAX / GLYCOLAX) packet Take 8 g by mouth in the morning.   Yes [provider]  propafenone (RYTHMOL SR) 225 MG 12 hr capsule TAKE 1 CAPSULE BY MOUTH TWICE A DAY 05/10/23  Yes Runell Gess, MD  valproic acid (DEPAKENE) 250 MG/5ML solution Take 5-10 mLs by mouth 3 (three) times daily. Take 5mL by mouth in the morning and afternoon, and take 10mL by mouth in the evening.   Yes [provider]  trimethoprim (TRIMPEX) 100 MG tablet Take 1 tablet (100 mg total) by mouth daily. Patient not taking: Reported on 06/29/2023 03/18/23   Lonia Blood, MD     Critical care time: n/a    Melody Comas, MD [facility] Office: [phone]   See Amion for personal pager PCCM on call pager [phone] until 7pm. Please call  [facility] 7p-7a. [phone]

## 2023-06-30 NOTE — Progress Notes (Signed)
Palliative:  Consult received. Chart reviewed.   Briefly spoke with patient's spouse via phone.  Planning for GOC discussion tomorrow (7/4) morning.   Gerlean Ren, DNP, AGNP-C Palliative Medicine Team Team Phone # [phone]  Pager # [phone]  NO CHARGE

## 2023-06-30 NOTE — ED Notes (Addendum)
This RN tried water with pt for a swallow eval, which pt passed. Then this RN gave pt Valproic Acid, which she appeared to tolerate well. This RN remained at bedside to monitor pt prior to administering Clobazam and noted that pt seemed to be attempting to cough but had a very weak cough reflex. Pt then appeared to be aspirating. Pt was already sitting up straight. This RN attempted to suction unsuccessfully. This RN placed pt on a non-rebreather mask and placed pt's head in an optimal airway position. Opyd MD contacted, Rapid Response called. Opyd MD in agreement with this RN that pt had likely aspirated. Requests that this RN follow Rapid Response directions and to call MD should any additional orders be needed. This RN related this to rapid response team. Rapid response nurse at bedside placing orders. Respiratory Therapist performed deep nasal suctioning. Pt tolerating interventions fairly well, however still in apparent distress. Will titrate O2 down as tolerated. Rapid Response RN to contact MD for possible higher level of care.

## 2023-06-30 NOTE — Progress Notes (Signed)
Pt arrived from the ER with 1 personal bag of clothing.  1 pink MOST form placed in shadow chart.

## 2023-06-30 NOTE — Significant Event (Addendum)
Rapid Response Event Note   Reason for Call :  Respiratory distress, weak congested cough, and hypoxia following medication/water administration.  Provider was notified PTA RRT and asked RN to call RRT to assess pt.  Initial Focused Assessment:  Pt sitting up in bed with eyes closed. Her breathing is tachypneic and labored. She is drooling. She has a very weak congested cough. Her lungs sounds are very diminished t/o. She will answer some questions and follow some commands. She says she is having trouble breathing.   HR-77, BP-101/86, RR-22, SpO2-99% on NRB.  Attempt made to titrate oxygen back down to 2L [place] with SpO2 dropping to upper 80s. NRB was replaced and pt NT suctioned x 3. After suctioning, pt able to be weaned to 6L [place]-94%, however, pt breathing remained labored/tachypneic.   Dr. Antionette Char to bedside to assess pt.   Interventions:  NRB NTS PCXR PCCM consulted Plan of Care:  NRB weaned down to 6L . Await PCXR results and PCCM consultation. Please call RRT if further assistance needed.  Event Summary:   MD Notified: Dr. Antionette Char notified and came to bedside.  Call Time:0258 Arrival Time:0302 End ZOXW:[id number]   Update:0430-pt txed to 2M12. Plan: nebs, chest PT, pulmonary toilet.   Terrilyn Saver, RN

## 2023-06-30 NOTE — Progress Notes (Signed)
eLink Physician-Brief Progress Note Patient Name: [name] DOB: 1956/09/06 MRN: [id number]   Date of Service  06/30/2023  HPI/Events of Note  Patient with advanced dementia with recent rapid decline, goals of care meeting is scheduled for  the AM. She has PO medications due tonight but she is too obtunded to safely receive them.  eICU Interventions  Okay to hold medications that are due tonight pending a goals of care meeting in AM.        Thomasene Lot Zema Lizardo 06/30/2023, 9:43 PM

## 2023-06-30 NOTE — Evaluation (Signed)
Clinical/Bedside Swallow Evaluation Patient Details  Name: [name] MRN: [id number] Date of Birth: [birth year]  Today's Date: 06/30/2023 Time: SLP Start Time (ACUTE ONLY): 0920 SLP Stop Time (ACUTE ONLY): 0940 SLP Time Calculation (min) (ACUTE ONLY): 20 min  Past Medical History:  Past Medical History:  Diagnosis Date   Altered mental status 01/18/2023   Arthritis lower back/hands   Atypical chest pain 04/15/2017   Atypical chest pain   Brain injury (HCC)    Cancer (HCC) breast tumor removed   COVID-19 02/02/2023   Dementia (HCC)    PSD-FTD   Disorientation 01/18/2023   Hip fracture (HCC) 09/06/2019   History of epilepsy 02/02/2023   Leg fracture    Seizure disorder (HCC)    Seizures (HCC)    Sepsis (HCC) 01/11/2023   VT (ventricular tachycardia) (HCC) 04/29/2017   Past Surgical History:  Past Surgical History:  Procedure Laterality Date   BREAST SURGERY     FACIAL FRACTURE SURGERY  orbital bone repair   LEFT HEART CATH AND CORONARY ANGIOGRAPHY N/A 04/30/2017   Procedure: Left Heart Cath and Coronary Angiography;  Surgeon: Marykay Lex, MD;  Location: [facility] INVASIVE CV LAB;  Service: Cardiovascular;  Laterality: N/A;   TOTAL HIP ARTHROPLASTY Left 09/07/2019   Procedure: TOTAL HIP ARTHROPLASTY ANTERIOR APPROACH;  Surgeon: Samson Frederic, MD;  Location: [facility] ORS;  Service: Orthopedics;  Laterality: Left;   HPI:  Patient is a 67 y.o. female with PMH: frontotemporal dementia, seizure disorder, brain injury, cancer, dysphagia. She presented to the hospital on 06/29/2023 with worsening problems with aspiration over several weeks, increased peripheral edema. During hospital admission, she developed septic shock and was transferred to the ICU. Attempt to give her home medication Depakote resulted in patient appearing to aspirate, becoming hypoxic, increased WOB and very weak, ineffective cough. Rapid Response was called and patient was deep suctioned. CXR completed on 06/30/23 was  reportedly improved compared to initial CXR taken day of admission.    Assessment / Plan / Recommendation  Clinical Impression  Patient presents with clinical s/s of dysphagia as per this bedside swallow evaluation. Per her husband who was present in the room, at home they have transitioned to puree solids and nectar thick liquids and were considering changing to honey thick liquids. 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 SLP assessed patient's swallow function with nectar thick liquids via spoon and straw sips and puree solids (applesauce). Swallow initiation was suspected to be at least slightly delayed, however no overt s/s aspiration and patient readily accepting boluses. Lethargy and fatigue will be patient's biggest hinderance to swallow safety and she must be fully awake and alert, sitting upright when she is fed PO's. SLP discussed this with both RN and patient's spouse. Recommendation is to initiate PO diet of full liquids/nectar thick and SLP will follow for toleration and ability to advance. SLP Visit Diagnosis: Dysphagia, oropharyngeal phase (R13.12)    Aspiration Risk  Mild aspiration risk;Risk for inadequate nutrition/hydration    Diet Recommendation Nectar-thick liquid    Medication Administration: Crushed with puree Supervision: Full supervision/cueing for compensatory strategies;Staff to assist with self feeding Compensations: Slow rate;Small sips/bites Postural Changes: Seated upright at 90 degrees;Remain upright for at least 30 minutes after po intake    Other  Recommendations Oral Care Recommendations: Oral care BID;Staff/trained caregiver  to provide oral care    Recommendations for follow up therapy are one component of a  multi-disciplinary discharge planning process, led by the attending physician.  Recommendations may be updated based on patient status, additional functional criteria and insurance authorization.  Follow up Recommendations Follow physician's recommendations for discharge plan and follow up therapies      Assistance Recommended at Discharge    Functional Status Assessment Patient has had a recent decline in their functional status and demonstrates the ability to make significant improvements in function in a reasonable and predictable amount of time.  Frequency and Duration min 2x/week  2 weeks       Prognosis Prognosis for improved oropharyngeal function: Fair Barriers to Reach Goals: Severity of deficits;Cognitive deficits      Swallow Study   General Date of Onset: 06/29/23 HPI: Patient is a 67 y.o. female with PMH: frontotemporal dementia, seizure disorder, brain injury, cancer, dysphagia. She presented to the hospital on 06/29/2023 with worsening problems with aspiration over several weeks, increased peripheral edema. During hospital admission, she developed septic shock and was transferred to the ICU. Attempt to give her home medication Depakote resulted in patient appearing to aspirate, becoming hypoxic, increased WOB and very weak, ineffective cough. Rapid Response was called and patient was deep suctioned. CXR completed on 06/30/23 was reportedly improved compared to initial CXR taken day of admission. Type of Study: Bedside Swallow Evaluation Previous Swallow Assessment: MBS January 2024 Diet Prior to this Study: NPO Temperature Spikes Noted: No Respiratory Status: Nasal cannula History of Recent Intubation: No Behavior/Cognition: Cooperative;Pleasant mood;Alert;Lethargic/Drowsy Oral Cavity Assessment: Within Functional Limits Oral Care Completed by SLP: Yes Oral Cavity - Dentition: Adequate natural dentition Self-Feeding Abilities: Refused PO Patient Positioning: Upright in  bed Baseline Vocal Quality: Low vocal intensity Volitional Cough: Cognitively unable to elicit Volitional Swallow: Unable to elicit    Oral/Motor/Sensory Function Overall Oral Motor/Sensory Function: Other (comment) (able to open mouth adequately to accept boluses)   Ice Chips     Thin Liquid Thin Liquid: Not tested    Nectar Thick Nectar Thick Liquid: Impaired Presentation: Straw Pharyngeal Phase Impairments: Suspected delayed Swallow   Honey Thick     Puree Puree: Within functional limits Presentation: Spoon   Solid     Solid: Not tested     Angela Nevin, MA, CCC-SLP Speech Therapy

## 2023-06-30 NOTE — Consult Note (Signed)
Neurology Consultation  Reason for Consult: Depakote induced thrombocytopenia Referring Physician: Dr. Wynona Neat  CC: AMS  History is obtained from: Record and patient  HPI: [name] is a 67 y.o. female with past medical history of TBI, frontotemporal dementia, history of epilepsy, cancer, who presents to [facility] emergency room for increased oxygen requirements, problems with aspiration over the last few months and a worsening cough.  Chart review she has had a progressive decline over the last few months and has had multiple hospital admissions and has not made it to any of her outpatient neurology appointments.  She was subsequently transferred to the ICU for acute respiratory failure and hypotension on Levophed drip.  Was hypothermic on arrival to the emergency room WBC 2.7, Hgb 10.9, platelets 63, BNP 198.6, ammonia 41, sodium 133, vitamin B12 1252, folate 18.6 UA negative CT head with no acute process  On exam patient is sleeping in no apparent distress has Levophed infusing through IV.  Patient awakens to noxious stimuli opens her eyes, and drifts back to sleep, poor attention and concentration. she can state her name however is not oriented to time situation or place.  Her speech is dysarthric and incomprehensible at times, she has poor attention and concentration drifts off to sleep.  Pupils are equal and reactive, can intermittently follow very simple commands, responds to noxious stimuli.  No family at the bedside   ROS:  Unable to obtain due to altered mental status.   Past Medical History:  Diagnosis Date   Altered mental status 01/18/2023   Arthritis lower back/hands   Atypical chest pain 04/15/2017   Atypical chest pain   Brain injury (HCC)    Cancer (HCC) breast tumor removed   COVID-19 02/02/2023   Dementia (HCC)    PSD-FTD   Disorientation 01/18/2023   Hip fracture (HCC) 09/06/2019   History of epilepsy 02/02/2023   Leg fracture    Seizure disorder (HCC)     Seizures (HCC)    Sepsis (HCC) 01/11/2023   VT (ventricular tachycardia) (HCC) 04/29/2017     Family History  Problem Relation Age of Onset   Heart failure Mother    Heart disease Mother    Pancreatic cancer Father    Cancer Brother      Social History:   reports that she has never smoked. She has never used smokeless tobacco. She reports that she does not drink alcohol and does not use drugs.  Medications  Current Facility-Administered Medications:    0.9 %  sodium chloride infusion, , Intravenous, PRN, Nolberto Hanlon, MD, Last Rate: 5 mL/hr at 06/30/23 1500, Infusion Verify at 06/30/23 1500   0.9 %  sodium chloride infusion, 250 mL, Intravenous, Continuous, Ogan, Okoronkwo U, MD   acetaminophen (TYLENOL) tablet 650 mg, 650 mg, Oral, Q6H PRN **OR** acetaminophen (TYLENOL) suppository 650 mg, 650 mg, Rectal, Q6H PRN, Nolberto Hanlon, MD   Ampicillin-Sulbactam (UNASYN) 3 g in sodium chloride 0.9 % 100 mL IVPB, 3 g, Intravenous, Q6H, Nolberto Hanlon, MD, Last Rate: 200 mL/hr at 06/30/23 1631, 3 g at 06/30/23 1631   azithromycin (ZITHROMAX) 500 mg in sodium chloride 0.9 % 250 mL IVPB, 500 mg, Intravenous, Q24H, Nolberto Hanlon, MD, Stopped at 06/29/23 2228   Chlorhexidine Gluconate Cloth 2 % PADS 6 each, 6 each, Topical, Q0600, Nolberto Hanlon, MD, 6 each at 06/30/23 0445   cloBAZam (ONFI) tablet 10 mg, 10 mg, Oral, Daily, Nolberto Hanlon, MD, 10 mg at 06/30/23 1018   cloBAZam (ONFI) tablet 15  mg, 15 mg, Oral, QHS, Nolberto Hanlon, MD   enoxaparin (LOVENOX) injection 40 mg, 40 mg, Subcutaneous, Q24H, Nolberto Hanlon, MD, 40 mg at 06/30/23 1025   feeding supplement (NEPRO CARB STEADY) liquid 237 mL, 237 mL, Oral, TID WC, Olalere, Adewale A, MD, 237 mL at 06/30/23 1631   insulin aspart (novoLOG) injection 0-15 Units, 0-15 Units, Subcutaneous, TID WC, Nolberto Hanlon, MD   insulin aspart (novoLOG) injection 0-5 Units, 0-5 Units, Subcutaneous, QHS, Nolberto Hanlon, MD   ipratropium-albuterol (DUONEB) 0.5-2.5 (3) MG/3ML nebulizer  solution 3 mL, 3 mL, Nebulization, Q6H, Melody Comas B, MD, 3 mL at 06/30/23 1525   levothyroxine (SYNTHROID) tablet 100 mcg, 100 mcg, Oral, Q0600, Nolberto Hanlon, MD   norepinephrine (LEVOPHED) 4mg  in (0.016 mg/mL) premix infusion, 2-10 mcg/min, Intravenous, Titrated, Ogan, Okoronkwo U, MD, Last Rate: 7.5 mL/hr at 06/30/23 1631, 2 mcg/min at 06/30/23 1631   pantoprazole (PROTONIX) EC tablet 20 mg, 20 mg, Oral, Daily, Nolberto Hanlon, MD, 20 mg at 06/30/23 1017   propafenone (RYTHMOL) tablet 150 mg, 150 mg, Oral, Q8H, Olalere, Adewale A, MD, 150 mg at 06/30/23 1118   sodium chloride flush (NS) 0.9 % injection 3 mL, 3 mL, Intravenous, Q12H, Nolberto Hanlon, MD, 3 mL at 06/30/23 1025   sodium chloride HYPERTONIC 3 % nebulizer solution 4 mL, 4 mL, Nebulization, BID, Martina Sinner, MD   valproic acid (DEPAKENE) 250 MG/5ML solution 250 mg, 250 mg, Oral, 2 times per day, Nolberto Hanlon, MD, 250 mg at 06/30/23 1355   valproic acid (DEPAKENE) 250 MG/5ML solution 500 mg, 500 mg, Oral, QHS, Nolberto Hanlon, MD, 500 mg at 06/30/23 0246   Exam: Current vital signs: BP 110/65   Pulse 71   Temp (!) 97.4 F (36.3 C) (Axillary)   Resp 16   Ht 5\' 6"  (1.676 m)   Wt 90.8 kg   SpO2 99%   BMI 32.31 kg/m  Vital signs in last 24 hours: Temp:  [93.6 F (34.2 C)-97.5 F (36.4 C)] 97.4 F (36.3 C) (07/03 1519) Pulse Rate:  [51-81] 71 (07/03 1545) Resp:  [9-24] 16 (07/03 1545) BP: (75-151)/(41-99) 110/65 (07/03 1545) SpO2:  [82 %-100 %] 99 % (07/03 1545) Weight:  [90.8 kg-93 kg] 90.8 kg (07/03 0432)  GENERAL: Critically ill female in no apparent distress HEENT: - Normocephalic and atraumatic, dry mm, patchy scaly areas over face LUNGS - Clear to auscultation bilaterally with no wheezes CV - S1S2 RRR, no m/r/g, equal pulses bilaterally. ABDOMEN - Soft, nontender, nondistended with normoactive BS Ext: warm, well perfused, intact peripheral pulses, generalized edema in all 4 extremities  NEURO:  Mental  Status: AA&Ox1 self.  Unable to state age current month, year or situation Language: speech is dysarthric and hypophonic.some words are garbled and unintelligible.  She cannot name or repeat.  Inconsistently follows commands Cranial Nerves: PERRL EOM unable to assess, visual fields blink to threat bilaterally, no facial asymmetry, facial sensation intact, hearing intact, tongue/uvula/soft palate midline, normal sternocleidomastoid and trapezius muscle strength. No evidence of tongue atrophy or fibrillations Motor: No spontaneous movement or movement on command, minimally withdraws to noxious stimuli in bilateral uppers with slight wiglle of fingers, bilateral lowers slight flicker to noxious stimuli, and moans in all 4 extremities to noxious stimuli Sensation-Responds to noxious stimuli, moans Coordination: Unable to assess Gait- deferred     Labs I have reviewed labs in epic and the results pertinent to this consultation are:  CBC    Component Value Date/Time   WBC 3.0 (  L) 06/30/2023 0149   WBC 3.1 (L) 06/30/2023 0149   RBC 3.08 (L) 06/30/2023 0149   RBC 3.08 (L) 06/30/2023 0149   RBC 3.01 (L) 06/30/2023 0056   HGB 9.8 (L) 06/30/2023 0149   HGB 9.9 (L) 06/30/2023 0149   HGB 11.3 07/12/2020 1535   HCT 29.4 (L) 06/30/2023 0149   HCT 29.3 (L) 06/30/2023 0149   HCT 32.8 (L) 07/12/2020 1535   PLT 68 (L) 06/30/2023 0149   PLT 69 (L) 06/30/2023 0149   PLT 210 07/12/2020 1535   MCV 95.5 06/30/2023 0149   MCV 95.1 06/30/2023 0149   MCV 90 07/12/2020 1535   MCH 31.8 06/30/2023 0149   MCH 32.1 06/30/2023 0149   MCHC 33.3 06/30/2023 0149   MCHC 33.8 06/30/2023 0149   RDW 16.7 (H) 06/30/2023 0149   RDW 16.6 (H) 06/30/2023 0149   RDW 12.9 07/12/2020 1535   LYMPHSABS 1.0 06/30/2023 0149   MONOABS 0.2 06/30/2023 0149   EOSABS 0.0 06/30/2023 0149   BASOSABS 0.0 06/30/2023 0149    CMP     Component Value Date/Time   NA 134 (L) 06/30/2023 0149   NA 130 (L) 08/05/2020 1428   K 4.0  06/30/2023 0149   CL 90 (L) 06/30/2023 0149   CO2 35 (H) 06/30/2023 0149   GLUCOSE 80 06/30/2023 0149   BUN 5 (L) 06/30/2023 0149   BUN 17 08/05/2020 1428   CREATININE 0.72 06/30/2023 0149   CALCIUM 8.8 (L) 06/30/2023 0149   PROT 5.9 (L) 06/29/2023 1559   PROT 6.3 08/05/2020 1428   ALBUMIN 2.4 (L) 06/29/2023 1559   ALBUMIN 4.1 08/05/2020 1428   AST 21 06/29/2023 1559   ALT 14 06/29/2023 1559   ALKPHOS 69 06/29/2023 1559   BILITOT 0.5 06/29/2023 1559   BILITOT <0.2 08/05/2020 1428   GFRNONAA >60 06/30/2023 0149   GFRAA 81 08/05/2020 1428    Lipid Panel     Component Value Date/Time   CHOL 188 04/30/2017 0338   TRIG 24 04/30/2017 0338   HDL 106 04/30/2017 0338   CHOLHDL 1.8 04/30/2017 0338   VLDL 5 04/30/2017 0338   LDLCALC 77 04/30/2017 0338    Lab Results  Component Value Date   HGBA1C 5.0 06/29/2023      Imaging I have reviewed the images obtained:  CT-head-no acute process  Assessment:  67 y.o. female with past medical history of TBI, frontotemporal dementia, history of epilepsy, cancer, who presents to [facility] emergency room for increased oxygen requirements, problems with aspiration over the last few months and a worsening cough. She developed hypotension and septic shock overnight and has been transferred to the ICU.Marland Kitchen  She was noted to be thromobcytopenic in the ICU with suspicion that VPA could be contributing to this. Doe have tremulous movements in BL uppers that could also be explained by Depakote. Her depakote levels are supratherapeutic this admission. Will recommend that we stop Valproic acid and switch her to Vimpat instead.  Recommendations: -Continue Onfi -Will load with Vimpat 200 mg x 1 and then continue 100 mg twice daily.(Reviewed EKG with normal PR interval) -Neurology will be available on an as needed basis and not actively following her. Please feel free to reach out to [place] with any questions or concerns.  Gevena Mart DNP, ACNPC-AG  [facility]   NEUROHOSPITALIST ADDENDUM Performed a face to face diagnostic evaluation.   I have reviewed the contents of history and physical exam as documented by PA/ARNP/Resident and agree with  above documentation.  I have discussed and formulated the above plan as documented. Edits to the note have been made as needed.  Erick Blinks, MD [facility] [phone]   If 7pm to 7am, please call on call as listed on AMION.

## 2023-06-30 NOTE — ED Notes (Signed)
Adolph Pollack MD was paged by secretary at 0050am . Briscoe Deutscher MD responded back at 0111am and was informed of pts low bp

## 2023-06-30 NOTE — Progress Notes (Signed)
eLink Physician-Brief Progress Note Patient Name: [name] DOB: [birth year] MRN: [id number]   Date of Service  06/30/2023  HPI/Events of Note  Patient transferred to he ICU secondary to acute on chronic respiratory failure caused by recurrent aspiration pneumonia, she's also been hypotensive  r/o septic shock vs volume depletion.  eICU Interventions  New Patient Evaluation. Albumin 25 % 50 gm IV x 1 ordered, Levophed peripheral iv protocol gtt also ordered.        Thomasene Lot Jaquaveon Bilal 06/30/2023, 6:09 AM

## 2023-06-30 NOTE — Progress Notes (Signed)
Patient admitted to [facility] for acute hypoxia secondary to recurrent aspiration but later developed septic shock therefore transferred to ICU. Currently patient is on pressors.  PCCM to take over care. TRH will be available to pick back up again once out of ICU.  Discussed with Dr. Wynona Neat.  Appreciate his help.  Stephania Fragmin MD [facility]

## 2023-07-01 ENCOUNTER — Inpatient Hospital Stay (HOSPITAL_COMMUNITY): Payer: Medicare PPO

## 2023-07-01 DIAGNOSIS — F0390 Unspecified dementia without behavioral disturbance: Secondary | ICD-10-CM | POA: Diagnosis not present

## 2023-07-01 DIAGNOSIS — J9621 Acute and chronic respiratory failure with hypoxia: Secondary | ICD-10-CM | POA: Diagnosis not present

## 2023-07-01 DIAGNOSIS — J690 Pneumonitis due to inhalation of food and vomit: Secondary | ICD-10-CM

## 2023-07-01 DIAGNOSIS — R601 Generalized edema: Secondary | ICD-10-CM | POA: Diagnosis not present

## 2023-07-01 DIAGNOSIS — Z515 Encounter for palliative care: Secondary | ICD-10-CM

## 2023-07-01 DIAGNOSIS — Z7189 Other specified counseling: Secondary | ICD-10-CM

## 2023-07-01 LAB — COMPREHENSIVE METABOLIC PANEL
ALT: 11 U/L (ref 0–44)
AST: 16 U/L (ref 15–41)
Albumin: 2.8 g/dL — ABNORMAL LOW (ref 3.5–5.0)
Alkaline Phosphatase: 55 U/L (ref 38–126)
Anion gap: 7 (ref 5–15)
BUN: <5 mg/dL — ABNORMAL LOW (ref 8–23)
CO2: 36 mmol/L — ABNORMAL HIGH (ref 22–32)
Calcium: 9.1 mg/dL (ref 8.9–10.3)
Chloride: 94 mmol/L — ABNORMAL LOW (ref 98–111)
Creatinine, Ser: 0.72 mg/dL (ref 0.44–1.00)
GFR, Estimated: >60 mL/min (ref >60)
Glucose, Bld: 105 mg/dL — ABNORMAL HIGH (ref 70–99)
Potassium: 3.3 mmol/L — ABNORMAL LOW (ref 3.5–5.1)
Sodium: 137 mmol/L (ref 135–145)
Total Bilirubin: 0.9 mg/dL (ref 0.3–1.2)
Total Protein: 5.7 g/dL — ABNORMAL LOW (ref 6.5–8.1)

## 2023-07-01 LAB — POCT I-STAT 7, (LYTES, BLD GAS, ICA,H+H)
Acid-Base Excess: 14 mmol/L — ABNORMAL HIGH (ref 0.0–2.0)
Bicarbonate: 39.9 mmol/L — ABNORMAL HIGH (ref 20.0–28.0)
Calcium, Ion: 1.27 mmol/L (ref 1.15–1.40)
HCT: 26 % — ABNORMAL LOW (ref 36.0–46.0)
Hemoglobin: 8.8 g/dL — ABNORMAL LOW (ref 12.0–15.0)
O2 Saturation: 96 %
Patient temperature: 97.5
Potassium: 3.9 mmol/L (ref 3.5–5.1)
Sodium: 136 mmol/L (ref 135–145)
TCO2: 42 mmol/L — ABNORMAL HIGH (ref 22–32)
pCO2 arterial: 59.7 mmHg — ABNORMAL HIGH (ref 32–48)
pH, Arterial: 7.431 (ref 7.35–7.45)
pO2, Arterial: 80 mmHg — ABNORMAL LOW (ref 83–108)

## 2023-07-01 LAB — GLUCOSE, CAPILLARY
Glucose-Capillary: 90 mg/dL (ref 70–99)
Glucose-Capillary: 95 mg/dL (ref 70–99)
Glucose-Capillary: 122 mg/dL — ABNORMAL HIGH (ref 70–99)
Glucose-Capillary: 113 mg/dL — ABNORMAL HIGH (ref 70–99)
Glucose-Capillary: 112 mg/dL — ABNORMAL HIGH (ref 70–99)

## 2023-07-01 LAB — CBC
HCT: 26.6 % — ABNORMAL LOW (ref 36.0–46.0)
Hemoglobin: 8.7 g/dL — ABNORMAL LOW (ref 12.0–15.0)
MCH: 31.5 pg (ref 26.0–34.0)
MCHC: 32.7 g/dL (ref 30.0–36.0)
MCV: 96.4 fL (ref 80.0–100.0)
Platelets: 50 10*3/uL — ABNORMAL LOW (ref 150–400)
RBC: 2.76 MIL/uL — ABNORMAL LOW (ref 3.87–5.11)
RDW: 16.9 % — ABNORMAL HIGH (ref 11.5–15.5)
WBC: 3.4 10*3/uL — ABNORMAL LOW (ref 4.0–10.5)
nRBC: 0 % (ref 0.0–0.2)

## 2023-07-01 LAB — LACTIC ACID, PLASMA: Lactic Acid, Venous: 0.8 mmol/L (ref 0.5–1.9)

## 2023-07-01 LAB — TROPONIN I (HIGH SENSITIVITY)
Troponin I (High Sensitivity): 5 ng/L (ref <18)
Troponin I (High Sensitivity): 6 ng/L (ref <18)

## 2023-07-01 LAB — CULTURE, BLOOD (ROUTINE X 2)

## 2023-07-01 LAB — MAGNESIUM: Magnesium: 2.2 mg/dL (ref 1.7–2.4)

## 2023-07-01 MED ORDER — POTASSIUM CHLORIDE 10 MEQ/100ML IV SOLN
10.0000 meq | INTRAVENOUS | Status: AC
  Administered 2023-07-01 (×3): 10 meq via INTRAVENOUS
  Filled 2023-07-01 (×3): qty 100

## 2023-07-01 MED ORDER — AMIODARONE IV BOLUS ONLY 150 MG/100ML
150.0000 mg | Freq: Once | INTRAVENOUS | Status: AC
  Administered 2023-07-01: 150 mg via INTRAVENOUS

## 2023-07-01 MED ORDER — FUROSEMIDE 10 MG/ML IJ SOLN
20.0000 mg | Freq: Once | INTRAMUSCULAR | Status: AC
  Administered 2023-07-01: 20 mg via INTRAVENOUS
  Filled 2023-07-01: qty 2

## 2023-07-01 MED ORDER — FUROSEMIDE 10 MG/ML IJ SOLN
INTRAMUSCULAR | Status: AC
  Filled 2023-07-01: qty 2

## 2023-07-01 MED ORDER — POTASSIUM CHLORIDE 20 MEQ PO PACK
40.0000 meq | PACK | Freq: Once | ORAL | Status: AC
  Administered 2023-07-01: 40 meq
  Filled 2023-07-01: qty 2

## 2023-07-01 MED ORDER — AMIODARONE IV BOLUS ONLY 150 MG/100ML
INTRAVENOUS | Status: AC
  Filled 2023-07-01: qty 100

## 2023-07-01 NOTE — Progress Notes (Signed)
Informed about 5 beat run of V. Tach  Check Lytes Magnesium was fine this morning

## 2023-07-01 NOTE — Progress Notes (Signed)
Speech Language Pathology Treatment: Dysphagia  Patient Details Name: [name] MRN: [id number] DOB: [birth year] Today's Date: 07/01/2023 Time: 8295-6213 SLP Time Calculation (min) (ACUTE ONLY): 15 min  Assessment / Plan / Recommendation Clinical Impression  Patient seen by SLP for skilled treatment focused on dysphagia goals. When SLP arrived, spouse was getting ready to feed her some applesauce. He reports that she has been tolerating PO's well and he feels that the full liquids/nectar thickened is the best consistency for her at this time. He reported that at home he has had to frequently clear PO residuals from mouth which were mainly when giving her more solid PO's like bread. Patient was awake, alert, requiring verbal and tactile cues to open mouth when SLP checking for PO residuals. SLP helped to support her head while spouse fed her applesauce via spoon. She exhibited delayed oral transit and suspected swallow initiation delay which did not appear changed from yesterday's initial evaluation. Spouse asked about potential need for oral suction at home and SLP was in agreement that this would be beneficial to improve oral care/health and reduce risk of aspiration from oral residuals. RN entered room during session and spouse told her he would like to plan for Cortrak feeding tube placement next date. SLP spent some time discussing PEG tube and that it would not reduce her aspiration risk or improve her QOL. For now, plan is for Cortrak and to continue with full liquids/nectar thick. SLP will continue to follow.      HPI HPI: Patient is a 67 y.o. female with PMH: frontotemporal dementia, seizure disorder, brain injury, cancer, dysphagia. She presented to the hospital on 06/29/2023 with worsening problems with aspiration over several weeks, increased peripheral edema. During hospital admission, she developed septic shock and was transferred to the ICU. Attempt to give her home medication Depakote  resulted in patient appearing to aspirate, becoming hypoxic, increased WOB and very weak, ineffective cough. Rapid Response was called and patient was deep suctioned. CXR completed on 06/30/23 was reportedly improved compared to initial CXR taken day of admission.      SLP Plan  Continue with current plan of care      Recommendations for follow up therapy are one component of a multi-disciplinary discharge planning process, led by the attending physician.  Recommendations may be updated based on patient status, additional functional criteria and insurance authorization.    Recommendations  Diet recommendations: Nectar-thick liquid Liquids provided via: Cup;Straw;Teaspoon Medication Administration: Crushed with puree Supervision: Trained caregiver to feed patient;Full supervision/cueing for compensatory strategies;Staff to assist with self feeding Compensations: Slow rate;Small sips/bites Postural Changes and/or Swallow Maneuvers: Seated upright 90 degrees                  Oral care BID;Staff/trained caregiver to provide oral care   Frequent or constant Supervision/Assistance Dysphagia, oropharyngeal phase (R13.12)     Continue with current plan of care     Angela Nevin, MA, CCC-SLP Speech Therapy

## 2023-07-01 NOTE — Progress Notes (Signed)
eLink Physician-Brief Progress Note Patient Name: [name] DOB: 20-Mar-1956 MRN: [id number]   Date of Service  07/01/2023  HPI/Events of Note  Patient with advanced dementia, suspected fronto-temporal dementia, she is hypoactive but not overtly distressed. BP 98/61, MAP 74, HR 66, RR 16-22, Sat 97 %.  eICU Interventions  No indication for intervention currently.        Migdalia Dk 07/01/2023, 9:34 PM

## 2023-07-01 NOTE — Progress Notes (Addendum)
PT cpt and Duo neb on hold at this time. CPT and Duo Neb treatment contraindicated at this time due to pt having runs of V. Tach. RN aware,CCM MD notified, RT will monitor as needed.

## 2023-07-01 NOTE — Progress Notes (Signed)
eLink Physician-Brief Progress Note Patient Name: [name] DOB: Aug 18, 1956 MRN: [id number]   Date of Service  07/01/2023  HPI/Events of Note  Transient desaturation. Patient recovered when a non re-breather mask was placed on her.  eICU Interventions  Will check a CXR and ABG, NT suction PRN by RT.        Thomasene Lot Dacota Ruben 07/01/2023, 10:34 PM

## 2023-07-01 NOTE — Progress Notes (Signed)
[facility] ADULT ICU REPLACEMENT PROTOCOL   The patient does apply for the [facility] Adult ICU Electrolyte Replacment Protocol based on the criteria listed below:   1.Exclusion criteria: TCTS, ECMO, Dialysis, and Myasthenia Gravis patients 2. Is GFR >/= 30 ml/min? Yes.    Patient's GFR today is >60 3. Is SCr </= 2? Yes.   Patient's SCr is 0.72 mg/dL 4. Did SCr increase >/= 0.5 in 24 hours? No. 5.Pt's weight >40kg  Yes.   6. Abnormal electrolyte(s): K 3.4  7. Electrolytes replaced per protocol 8.  Call MD STAT for K+ </= 2.5, Phos </= 1, or Mag </= 1 Physician:    Markus Daft A 07/01/2023 6:04 AM

## 2023-07-01 NOTE — Consult Note (Signed)
Consultation Note Date: 07/01/2023   Patient Name: [name]  DOB: 12/12/1956  MRN: [id number]  Age / Sex: 67 y.o., female  PCP: Jackelyn Poling, DO Referring Physician: Tomma Lightning, MD  Reason for Consultation: Establishing goals of care  HPI/Patient Profile: 67 y.o. female  with past medical history of frontotemporal dementia, progressive supranuclear palsy, seizures, and TBI admitted on 06/29/2023 with hypoxia and anasarca.  Patient also with aspiration pneumonia and bilateral pleural effusions.  She is being treated with antibiotics and supplemental oxygen.  Unable to proceed with thoracentesis.  Patient with ongoing dysphagia.  PMT consulted to discuss goals of care.  Clinical Assessment and Goals of Care: I have reviewed medical records including EPIC notes, labs and imaging, received report from RN, assessed the patient and then met with patient's spouse Darrell to discuss diagnosis prognosis, GOC, EOL wishes, disposition and options.  I introduced Palliative Medicine as specialized medical care for people living with serious illness. It focuses on providing relief from the symptoms and stress of a serious illness. The goal is to improve quality of life for both the patient and the family.  We discussed a brief life review of the patient.  Patient and spouse have been married most of their lives.  Spouse tells me they have a daughter who comes and assist several times a week.  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 Spouse takes excellent care of patient and ensures all of her needs are met.   Spouse shares of a decline since about January of this year.  Patient has lost her ability to ambulate.  She has become more dependent on her husband for daily cares.  Over the past few weeks she has developed difficulties with p.o. intake.   We discussed patient's current illness and what it means in the larger context of  patient's on-going co-morbidities.  Natural disease trajectory and expectations at EOL were discussed.  We discussed her progressive neurological diseases and how they are irreversible and incurable.  We discussed that we can only support her body through the process.  He expresses understanding to this.  We discussed current situation with concerns about p.o. intake and dysphagia.  Discussed treatment for aspiration pneumonia as well as her anasarca.  I attempted to elicit values and goals of care important to the patient.  Spouse tells me the patient does have a living will and that she would not want her life prolonged if she had advanced disease.  Spouse feels we are not at this point.  To him, he feels that he is still having meaningful interactions with patient and as long as this continues he feels that he should continue to advocate for full scope medical treatment.  He tells me about an interaction just last week when patient made eye contact with him and told him "I am happy".    We discussed potential need for artificial nutrition.  Spouse would agree to this.  He is hopeful she will only need a core track with temporary artificial nutrition.  We discussed that with her progressing neurological diseases she may end up needing a PEG tube.  He tells me he would be open to this.  Spouse shares that in the past with different medical providers he has felt "pushed" to make decisions regarding Ms. Furry's care. He does not want to feel pushed as he explains he is well educated about her diagnoses and aware of decisions he will face in the future  and will continue to make decisions for ms. Morozov he feels are most in line with her wishes. He shares there is resistance among other family members to hospice and so that also affects decisions.   For now, spouse would like patient to remain full code.   Discussed with spouse the importance of continued conversation with family and the medical providers  regarding overall plan of care and treatment options, ensuring decisions are within the context of the patients values and GOCs.    Hospice and Palliative Care services outpatient were explained and offered. Spouse speaks with outpatient palliative provider regularly and is aware of how they will continue to support patient. He expresses he understands she is eligible for hospice services and knows how to obtain these when he is ready. We discuss philosophy of hospice and type of support provided.  Provided spouse with "Hard Choices" booklet as well as my contact information. He will reach out with any further questions or concerns.   Questions and concerns were addressed. The family was encouraged to call with questions or concerns.  Primary Decision Maker NEXT OF KIN - spouse Darrell    SUMMARY OF RECOMMENDATIONS   - full code/full scope - spouse would be interested in all medical interventions offered to prolong life as long as he still feels that he and patient are having some meaningful interactions, at this point he feels they are - spouse is well connected to outpatient palliative program and knows they will assist him with transitioning to hospice whenever he feels he is ready for this type of support - Hard Choices booklet provided for further review regarding information on code status and feeding tubes  Code Status/Advance Care Planning: Full code     Primary Diagnoses: Present on Admission:  Dementia without behavioral disturbance (HCC)  Failure to thrive in adult  History of VT (ventricular tachycardia) (HCC)   I have reviewed the medical record, interviewed the patient and family, and examined the patient. The following aspects are pertinent.  Past Medical History:  Diagnosis Date   Altered mental status 01/18/2023   Arthritis lower back/hands   Atypical chest pain 04/15/2017   Atypical chest pain   Brain injury (HCC)    Cancer (HCC) breast tumor removed    COVID-19 02/02/2023   Dementia (HCC)    PSD-FTD   Disorientation 01/18/2023   Hip fracture (HCC) 09/06/2019   History of epilepsy 02/02/2023   Leg fracture    Seizure disorder (HCC)    Seizures (HCC)    Sepsis (HCC) 01/11/2023   VT (ventricular tachycardia) (HCC) 04/29/2017   Social History   Socioeconomic History   Marital status: Married    Spouse name: Not on file   Number of children: Not on file   Years of education: Not on file   Highest education level: Not on file  Occupational History   Not on file  Tobacco Use   Smoking status: Never   Smokeless tobacco: Never  Vaping Use   Vaping Use: Never used  Substance and Sexual Activity   Alcohol use: No   Drug use: No   Sexual activity: Not Currently  Other Topics Concern   Not on file  Social History Narrative   Right handed   Disabled    Social Determinants of Health   Financial Resource Strain: Not on file  Food Insecurity: No Food Insecurity (01/12/2023)   Hunger Vital Sign    Worried About Running Out of Food in the Last Year:  Never true    Ran Out of Food in the Last Year: Never true  Transportation Needs: No Transportation Needs (01/12/2023)   PRAPARE - Administrator, Civil Service (Medical): No    Lack of Transportation (Non-Medical): No  Physical Activity: Not on file  Stress: Not on file  Social Connections: Not on file   Family History  Problem Relation Age of Onset   Heart failure Mother    Heart disease Mother    Pancreatic cancer Father    Cancer Brother    Scheduled Meds:  Chlorhexidine Gluconate Cloth  6 each Topical Q0600   cloBAZam  10 mg Oral Daily   cloBAZam  15 mg Oral QHS   enoxaparin (LOVENOX) injection  40 mg Subcutaneous Q24H   feeding supplement (NEPRO CARB STEADY)  237 mL Oral TID WC   furosemide  20 mg Intravenous Once   insulin aspart  0-15 Units Subcutaneous TID WC   insulin aspart  0-5 Units Subcutaneous QHS   ipratropium-albuterol  3 mL Nebulization Q6H    levothyroxine  100 mcg Oral Q0600   pantoprazole  20 mg Oral Daily   potassium chloride  40 mEq Per Tube Once   propafenone  150 mg Oral Q8H   sodium chloride flush  3 mL Intravenous Q12H   sodium chloride HYPERTONIC  4 mL Nebulization BID   Continuous Infusions:  sodium chloride 5 mL/hr at 07/01/23 0900   sodium chloride     ampicillin-sulbactam (UNASYN) IV Stopped (07/01/23 0603)   lacosamide (VIMPAT) IV     norepinephrine (LEVOPHED) Adult infusion 2 mcg/min (07/01/23 0900)   PRN Meds:.sodium chloride, acetaminophen **OR** acetaminophen No Known Allergies Review of Systems  Unable to perform ROS: Dementia    Physical Exam Constitutional:      General: She is not in acute distress.    Appearance: She is ill-appearing.     Comments: Sleeping soundly, doesn't wake to voice  Pulmonary:     Effort: Pulmonary effort is normal.  Skin:    General: Skin is warm and dry.     Vital Signs: BP (!) 102/58   Pulse 69   Temp 97.6 F (36.4 C) (Oral)   Resp 16   Ht 5\' 6"  (1.676 m)   Wt 92.3 kg   SpO2 97%   BMI 32.84 kg/m  Pain Scale: 0-10   Pain Score: 0-No pain   SpO2: SpO2: 97 % O2 Device:SpO2: 97 % O2 Flow Rate: .O2 Flow Rate (L/min): 4 L/min  IO: Intake/output summary:  Intake/Output Summary (Last 24 hours) at 07/01/2023 0947 Last data filed at 07/01/2023 0900 Gross per 24 hour  Intake 1043.45 ml  Output 1700 ml  Net -656.55 ml    LBM: Last BM Date : 06/30/23 Baseline Weight: Weight: 93 kg Most recent weight: Weight: 92.3 kg     Palliative Assessment/Data: PPS 30%     *Please note that this is a verbal dictation therefore any spelling or grammatical errors are due to the "[employer] One" system interpretation.  Gerlean Ren, DNP, AGNP-C Palliative Medicine Team [phone] Pager: [phone]

## 2023-07-01 NOTE — Progress Notes (Signed)
Patient had another run of v.tach  Will order amiodarone bolus

## 2023-07-01 NOTE — Progress Notes (Signed)
CPT contraindicated at this time and on hold due to pt eating. RT will attempt at next scheduled time.

## 2023-07-01 NOTE — Progress Notes (Signed)
NAME:  [name], MRN:  [id number], DOB:  [birth year], LOS: 2 ADMISSION DATE:  06/29/2023, CONSULTATION DATE:  06/30/23 REFERRING MD:  Odie Sera, MD CHIEF COMPLAINT:  Aspiration   History of Present Illness:  [name] is a 67 year old woman with frontotemporal dementia who was brought in to the ER yesterday for an increase in home O2 needs from 1 to 2L along with cough and trouble swallowing her food/drink. Her husband is her primary care taker and reports increase in lower extremity edema over the past 1 week. She has had progressive decline over recent months with concern for supranuclear palsy by her outpatient neurology team. Unfortunately she has had multiple hospital admissions over recent months with difficulty making the outpatient neurology appointments.  She was admitted yesterday by the hospitalist team and placed on a lasix drip for anasarca, aspiration and failure to thrive.. She developed hypotension earlier this morning in which lasix drip was stopped and given albumin with good effect on blood pressure. She was given PO liquid medication in the ER after passing her bedside swallow eval and then demonstrated concern for aspiration with cough and poor ability to clear her throat. She developed increased work of breathing, increased O2 need and rapid response was called. Patient responded to NT suctioning with improvement of her work of breathing but continued to require frequent suctioning. She was weaned to 6L [place].   PCCM consulted for respiratory failure and aspiration. Recommended patient be admitted to the ICU for increased level of nursing and RT needs for frequent suctioning.   Pertinent  Medical History   Past Medical History:  Diagnosis Date   Altered mental status 01/18/2023   Arthritis lower back/hands   Atypical chest pain 04/15/2017   Atypical chest pain   Brain injury (HCC)    Cancer (HCC) breast tumor removed   COVID-19 02/02/2023   Dementia (HCC)    PSD-FTD    Disorientation 01/18/2023   Hip fracture (HCC) 09/06/2019   History of epilepsy 02/02/2023   Leg fracture    Seizure disorder (HCC)    Seizures (HCC)    Sepsis (HCC) 01/11/2023   VT (ventricular tachycardia) (HCC) 04/29/2017   Significant Hospital Events: Including procedures, antibiotic start and stop dates in addition to other pertinent events   7/2 admitted to hospital  Start azithromycin 500 mg IV for five days (7/2 - 7/7)  Start Unasyn 3 g IV (7/2 - )  Chest Xray: Developing small to moderate pleural effusions. Adjacent lung opacities.  CT Head w/o: Atrophy, chronic microvascular disease. No acute intracranial abnormality. Acute on chronic sinusitis.  CTA Chest: 1. No CT evidence of pulmonary embolism. 2. Small bilateral pleural effusions, significantly increased since the prior CT. 3. Complete consolidation of the left lower lobe with partial consolidation of the right lower lobe which may represent atelectasis or pneumonia. Underlying mass is not excluded clinical correlation and follow-up to resolution recommended. 4.  Aortic Atherosclerosis.  7/3 PCCM consult - hypoxia, aspiration. Remains on pressors  7/4-elevated valproic acid level, switched  Interim History / Subjective:  Rapid response called 7/3 overnight for respiratory distress and hypoxia. Initially titrated oxygen down to 2L [place] with SpO2 dropping to upper 80s. NRB replaced and pt NT suctioned x 3. Following suctioning, pt weaned to 6L [place]-94%. She received levophed and albumin for hypotension.    No overnight events Alert and responsive, remains very frail  Objective   Blood pressure 126/65, pulse 71, temperature 97.6 F (36.4 C),  temperature source Oral, resp. rate 17, height 5\' 6"  (1.676 m), weight 92.3 kg, SpO2 96 %.        Intake/Output Summary (Last 24 hours) at 07/01/2023 0751 Last data filed at 07/01/2023 0700 Gross per 24 hour  Intake 874.92 ml  Output 1900 ml  Net -1025.08 ml   Filed Weights    06/29/23 2157 06/30/23 0432 07/01/23 0500  Weight: 93 kg 90.8 kg 92.3 kg    Examination: General: Elderly, frail, HENT: Moist oral mucosa Lungs: Decreased breath sounds bilaterally Cardiovascular: S1-S2 appreciated with no murmur Abdomen: Soft, bowel sounds appreciated Extremities: Warm and dry, edema bilaterally Neuro: asleep GU: n/a  Resolved Hospital Problem list     Assessment & Plan:   Aspiration pneumonia Bilateral pleural effusions Acute hypoxemic respiratory failure -Will continue Unasyn -Discontinue azithromycin -Continue submental oxygen -Does have bilateral pleural effusions but did not find a safe pocket of fluid for thoracentesis -Continue pulmonary toileting  Frontoparietal dementia Concern for supranuclear palsy -Palliative care consult -For goals of care discussions today  Seizure disorder Valproic acid toxicity -Valproic acid was discontinued -Started on Vimpat -Appreciate neurology involvement -Continue Onfi  Septic shock -Secondary to aspiration pneumonia -Will continue Unasyn  Hypokalemia -Potassium being replaced  Dysphagia -Cleared for nectar thick diet -Medication administered with pure -Plan for cortrak tomorrow if still having significant difficulty swallowing  Pancytopenia with concern may be related to valproic acid Thrombocytopenia -Continue to monitor  Anasarca - lasix held in the setting of hypotension  - s/p albumin 25 g IV  -Pressor requirement improving, will give 20 Lasix IV  Moderate protein calorie malnutrition - consider doboff placement for tube feeds -Present on admission  Best Practice (right click and "Reselect all SmartList Selections" daily)   On Lovenox for DVT prophylaxis Full code Goals of care discussion today with palliative care  Labs   CBC: Recent Labs  Lab 06/29/23 1500 06/29/23 1559 06/30/23 0149 07/01/23 0236  WBC  --  2.7* 3.1*  3.0* 3.4*  NEUTROABS  --  1.6* 1.9  --   HGB 9.9*  10.9* 9.9*  9.8* 8.7*  HCT 29.0* 32.5* 29.3*  29.4* 26.6*  MCV  --  95.3 95.1  95.5 96.4  PLT  --  63* 69*  68* 50*    Basic Metabolic Panel: Recent Labs  Lab 06/29/23 1500 06/29/23 1559 06/30/23 0056 06/30/23 0149 07/01/23 0236  NA 128* 131* 133* 134* 137  K 4.0 4.2 4.0 4.0 3.3*  CL  --  87* 91* 90* 94*  CO2  --  36* 35* 35* 36*  GLUCOSE  --  74 82 80 105*  BUN  --  7* 5* 5* <5*  CREATININE  --  0.83 0.72 0.72 0.72  CALCIUM  --  9.0 8.6* 8.8* 9.1  MG  --   --  1.5* 1.6* 2.2   GFR: Estimated Creatinine Clearance: 78.1 mL/min (by C-G formula based on SCr of 0.72 mg/dL). Recent Labs  Lab 06/29/23 1559 06/29/23 2021 06/30/23 0056 06/30/23 0149 07/01/23 0236  WBC 2.7*  --   --  3.1*  3.0* 3.4*  LATICACIDVEN  --  0.8 0.9  --  0.8    Liver Function Tests: Recent Labs  Lab 06/29/23 1559 07/01/23 0236  AST 21 16  ALT 14 11  ALKPHOS 69 55  BILITOT 0.5 0.9  PROT 5.9* 5.7*  ALBUMIN 2.4* 2.8*   No results for input(s): "LIPASE", "AMYLASE" in the last 168 hours. Recent Labs  Lab 06/29/23 1637  AMMONIA 41*    ABG    Component Value Date/Time   PHART 7.392 06/29/2023 1500   PCO2ART 64.5 (H) 06/29/2023 1500   PO2ART 63 (L) 06/29/2023 1500   HCO3 39.2 (H) 06/29/2023 1500   TCO2 41 (H) 06/29/2023 1500   O2SAT 91 06/29/2023 1500     Coagulation Profile: Recent Labs  Lab 06/29/23 1559 06/30/23 0149  INR 1.0 1.0    Cardiac Enzymes: No results for input(s): "CKTOTAL", "CKMB", "CKMBINDEX", "TROPONINI" in the last 168 hours.  HbA1C: Hgb A1c MFr Bld  Date/Time Value Ref Range Status  06/29/2023 10:52 PM 5.0 4.8 - 5.6 % Final    Comment:    (NOTE) Pre diabetes:          5.7%-6.4%  Diabetes:              >6.4%  Glycemic control for   <7.0% adults with diabetes     CBG: Recent Labs  Lab 06/30/23 0426 06/30/23 0739 06/30/23 1103 06/30/23 2333 07/01/23 0742  GLUCAP 86 71 101* 112* 90    Review of Systems:   As above in HPI  Past  Medical History:  She,  has a past medical history of Altered mental status (01/18/2023), Arthritis (lower back/hands), Atypical chest pain (04/15/2017), Brain injury (HCC), Cancer (HCC) (breast tumor removed), COVID-19 (02/02/2023), Dementia (HCC), Disorientation (01/18/2023), Hip fracture (HCC) (09/06/2019), History of epilepsy (02/02/2023), Leg fracture, Seizure disorder (HCC), Seizures (HCC), Sepsis (HCC) (01/11/2023), and VT (ventricular tachycardia) (HCC) (04/29/2017).   Surgical History:   Past Surgical History:  Procedure Laterality Date   BREAST SURGERY     FACIAL FRACTURE SURGERY  orbital bone repair   LEFT HEART CATH AND CORONARY ANGIOGRAPHY N/A 04/30/2017   Procedure: Left Heart Cath and Coronary Angiography;  Surgeon: Marykay Lex, MD;  Location: [facility] INVASIVE CV LAB;  Service: Cardiovascular;  Laterality: N/A;   TOTAL HIP ARTHROPLASTY Left 09/07/2019   Procedure: TOTAL HIP ARTHROPLASTY ANTERIOR APPROACH;  Surgeon: Samson Frederic, MD;  Location: [facility] ORS;  Service: Orthopedics;  Laterality: Left;     Social History:   reports that she has never smoked. She has never used smokeless tobacco. She reports that she does not drink alcohol and does not use drugs.   Family History:  Her family history includes Cancer in her brother; Heart disease in her mother; Heart failure in her mother; Pancreatic cancer in her father.   Allergies No Known Allergies   The patient is critically ill with multiple organ systems failure and requires high complexity decision making for assessment and support, frequent evaluation and titration of therapies, application of advanced monitoring technologies and extensive interpretation of multiple databases. Critical Care Time devoted to patient care services described in this note independent of APP/resident time (if applicable)  is 33 minutes.   Virl Diamond MD [facility] Personal pager: See Amion If unanswered, please page CCM  On-call: #[phone]

## 2023-07-01 NOTE — Progress Notes (Signed)
eLink Physician-Brief Progress Note Patient Name: BISAN BLIGHT DOB: [birth year] MRN: [id number]   Date of Service  07/01/2023  HPI/Events of Note  ABG with moderate hypercapnia but otherwise unremarkable. CXR shows bilateral pleural effusions and low lung volumes.  eICU Interventions          Migdalia Dk 07/01/2023, 11:02 PM

## 2023-07-02 ENCOUNTER — Telehealth: Payer: Self-pay | Admitting: Neurology

## 2023-07-02 ENCOUNTER — Inpatient Hospital Stay (HOSPITAL_COMMUNITY): Payer: Medicare PPO

## 2023-07-02 ENCOUNTER — Encounter: Payer: Self-pay | Admitting: Neurology

## 2023-07-02 ENCOUNTER — Encounter: Payer: Self-pay | Admitting: Cardiology

## 2023-07-02 DIAGNOSIS — J9622 Acute and chronic respiratory failure with hypercapnia: Secondary | ICD-10-CM

## 2023-07-02 DIAGNOSIS — Z66 Do not resuscitate: Secondary | ICD-10-CM

## 2023-07-02 DIAGNOSIS — Z7189 Other specified counseling: Secondary | ICD-10-CM | POA: Diagnosis not present

## 2023-07-02 DIAGNOSIS — R601 Generalized edema: Secondary | ICD-10-CM | POA: Diagnosis not present

## 2023-07-02 DIAGNOSIS — J9621 Acute and chronic respiratory failure with hypoxia: Secondary | ICD-10-CM | POA: Diagnosis not present

## 2023-07-02 DIAGNOSIS — F02C0 Dementia in other diseases classified elsewhere, severe, without behavioral disturbance, psychotic disturbance, mood disturbance, and anxiety: Secondary | ICD-10-CM

## 2023-07-02 DIAGNOSIS — I469 Cardiac arrest, cause unspecified: Secondary | ICD-10-CM | POA: Diagnosis not present

## 2023-07-02 DIAGNOSIS — G3109 Other frontotemporal dementia: Secondary | ICD-10-CM

## 2023-07-02 LAB — GLUCOSE, CAPILLARY
Glucose-Capillary: 109 mg/dL — ABNORMAL HIGH (ref 70–99)
Glucose-Capillary: 144 mg/dL — ABNORMAL HIGH (ref 70–99)
Glucose-Capillary: 150 mg/dL — ABNORMAL HIGH (ref 70–99)
Glucose-Capillary: 80 mg/dL (ref 70–99)
Glucose-Capillary: 90 mg/dL (ref 70–99)
Glucose-Capillary: 96 mg/dL (ref 70–99)

## 2023-07-02 LAB — POCT I-STAT 7, (LYTES, BLD GAS, ICA,H+H)
Acid-Base Excess: 12 mmol/L — ABNORMAL HIGH (ref 0.0–2.0)
Bicarbonate: 32.5 mmol/L — ABNORMAL HIGH (ref 20.0–28.0)
Calcium, Ion: 1.12 mmol/L — ABNORMAL LOW (ref 1.15–1.40)
HCT: 29 % — ABNORMAL LOW (ref 36.0–46.0)
Hemoglobin: 9.9 g/dL — ABNORMAL LOW (ref 12.0–15.0)
O2 Saturation: 100 %
Patient temperature: 94.6
Potassium: 2.6 mmol/L — CL (ref 3.5–5.1)
Sodium: 134 mmol/L — ABNORMAL LOW (ref 135–145)
TCO2: 33 mmol/L — ABNORMAL HIGH (ref 22–32)
pCO2 arterial: 24.6 mmHg — ABNORMAL LOW (ref 32–48)
pH, Arterial: 7.723 (ref 7.35–7.45)
pO2, Arterial: 244 mmHg — ABNORMAL HIGH (ref 83–108)

## 2023-07-02 LAB — BASIC METABOLIC PANEL
Anion gap: 8 (ref 5–15)
Anion gap: 15 (ref 5–15)
BUN: <5 mg/dL — ABNORMAL LOW (ref 8–23)
BUN: 6 mg/dL — ABNORMAL LOW (ref 8–23)
CO2: 35 mmol/L — ABNORMAL HIGH (ref 22–32)
CO2: 31 mmol/L (ref 22–32)
Calcium: 9.1 mg/dL (ref 8.9–10.3)
Calcium: 9.5 mg/dL (ref 8.9–10.3)
Chloride: 91 mmol/L — ABNORMAL LOW (ref 98–111)
Chloride: 90 mmol/L — ABNORMAL LOW (ref 98–111)
Creatinine, Ser: 0.69 mg/dL (ref 0.44–1.00)
Creatinine, Ser: 0.81 mg/dL (ref 0.44–1.00)
GFR, Estimated: >60 mL/min (ref >60)
GFR, Estimated: >60 mL/min (ref >60)
Glucose, Bld: 101 mg/dL — ABNORMAL HIGH (ref 70–99)
Glucose, Bld: 140 mg/dL — ABNORMAL HIGH (ref 70–99)
Potassium: 3.8 mmol/L (ref 3.5–5.1)
Potassium: 3 mmol/L — ABNORMAL LOW (ref 3.5–5.1)
Sodium: 134 mmol/L — ABNORMAL LOW (ref 135–145)
Sodium: 136 mmol/L (ref 135–145)

## 2023-07-02 LAB — CBC
HCT: 27.7 % — ABNORMAL LOW (ref 36.0–46.0)
Hemoglobin: 9.1 g/dL — ABNORMAL LOW (ref 12.0–15.0)
MCH: 31.8 pg (ref 26.0–34.0)
MCHC: 32.9 g/dL (ref 30.0–36.0)
MCV: 96.9 fL (ref 80.0–100.0)
Platelets: 51 10*3/uL — ABNORMAL LOW (ref 150–400)
RBC: 2.86 MIL/uL — ABNORMAL LOW (ref 3.87–5.11)
RDW: 16.5 % — ABNORMAL HIGH (ref 11.5–15.5)
WBC: 3.7 10*3/uL — ABNORMAL LOW (ref 4.0–10.5)
nRBC: 0 % (ref 0.0–0.2)

## 2023-07-02 LAB — CULTURE, BLOOD (ROUTINE X 2)

## 2023-07-02 LAB — MAGNESIUM
Magnesium: 1.8 mg/dL (ref 1.7–2.4)
Magnesium: 1.5 mg/dL — ABNORMAL LOW (ref 1.7–2.4)

## 2023-07-02 LAB — PHOSPHORUS: Phosphorus: 4.1 mg/dL (ref 2.5–4.6)

## 2023-07-02 MED ORDER — PANTOPRAZOLE SODIUM 40 MG IV SOLR
40.0000 mg | INTRAVENOUS | Status: DC
  Administered 2023-07-02 – 2023-07-07 (×6): 40 mg via INTRAVENOUS
  Filled 2023-07-02 (×6): qty 10

## 2023-07-02 MED ORDER — FENTANYL 2500MCG IN NS 250ML (10MCG/ML) PREMIX INFUSION
25.0000 ug/h | INTRAVENOUS | Status: DC
  Administered 2023-07-02: 25 ug/h via INTRAVENOUS
  Administered 2023-07-04: 50 ug/h via INTRAVENOUS
  Filled 2023-07-02 (×2): qty 250

## 2023-07-02 MED ORDER — FENTANYL BOLUS VIA INFUSION: 25.0000 ug | INTRAVENOUS | Status: DC | PRN

## 2023-07-02 MED ORDER — ACETAMINOPHEN 325 MG PO TABS: 650.0000 mg | ORAL_TABLET | Freq: Four times a day (QID) | ORAL | Status: DC | PRN

## 2023-07-02 MED ORDER — FENTANYL CITRATE PF 50 MCG/ML IJ SOSY: 25.0000 ug | PREFILLED_SYRINGE | Freq: Once | INTRAMUSCULAR | Status: DC

## 2023-07-02 MED ORDER — ACETAMINOPHEN 650 MG RE SUPP: 650.0000 mg | Freq: Four times a day (QID) | RECTAL | Status: DC | PRN

## 2023-07-02 MED ORDER — MIDAZOLAM HCL 2 MG/2ML IJ SOLN: 1.0000 mg | INTRAMUSCULAR | Status: DC | PRN

## 2023-07-02 MED ORDER — ROCURONIUM BROMIDE 10 MG/ML (PF) SYRINGE
PREFILLED_SYRINGE | INTRAVENOUS | Status: AC
  Administered 2023-07-02: 100 mg
  Filled 2023-07-02: qty 10

## 2023-07-02 MED ORDER — CLOBAZAM 5 MG PO HALF TABLET
15.0000 mg | ORAL_TABLET | Freq: Every day | ORAL | Status: DC
  Administered 2023-07-02 – 2023-07-07 (×6): 15 mg
  Filled 2023-07-02 (×6): qty 3

## 2023-07-02 MED ORDER — IPRATROPIUM-ALBUTEROL 0.5-2.5 (3) MG/3ML IN SOLN: 3.0000 mL | RESPIRATORY_TRACT | Status: DC | PRN

## 2023-07-02 MED ORDER — POLYETHYLENE GLYCOL 3350 17 G PO PACK
17.0000 g | PACK | Freq: Every day | ORAL | Status: DC
  Administered 2023-07-02 – 2023-07-05 (×2): 17 g
  Filled 2023-07-02 (×3): qty 1

## 2023-07-02 MED ORDER — THIAMINE MONONITRATE 100 MG PO TABS
100.0000 mg | ORAL_TABLET | Freq: Every day | ORAL | Status: AC
  Administered 2023-07-02 – 2023-07-08 (×7): 100 mg
  Filled 2023-07-02 (×7): qty 1

## 2023-07-02 MED ORDER — ORAL CARE MOUTH RINSE
15.0000 mL | OROMUCOSAL | Status: DC
  Administered 2023-07-02 – 2023-07-05 (×32): 15 mL via OROMUCOSAL

## 2023-07-02 MED ORDER — OSMOLITE 1.2 CAL PO LIQD
1000.0000 mL | ORAL | Status: DC
  Administered 2023-07-02 – 2023-07-06 (×7): 1000 mL
  Filled 2023-07-02 (×6): qty 1000

## 2023-07-02 MED ORDER — LEVOTHYROXINE SODIUM 100 MCG PO TABS
100.0000 ug | ORAL_TABLET | Freq: Every day | ORAL | Status: DC
  Administered 2023-07-02 – 2023-07-08 (×7): 100 ug
  Filled 2023-07-02 (×7): qty 1

## 2023-07-02 MED ORDER — FAMOTIDINE 20 MG PO TABS
20.0000 mg | ORAL_TABLET | Freq: Two times a day (BID) | ORAL | Status: DC
Start: 2023-07-02 — End: 2023-07-02

## 2023-07-02 MED ORDER — ETOMIDATE 2 MG/ML IV SOLN
INTRAVENOUS | Status: AC
  Administered 2023-07-02: 20 mg
  Filled 2023-07-02: qty 10

## 2023-07-02 MED ORDER — DOCUSATE SODIUM 50 MG/5ML PO LIQD
100.0000 mg | Freq: Two times a day (BID) | ORAL | Status: DC
  Administered 2023-07-02 – 2023-07-06 (×6): 100 mg
  Filled 2023-07-02 (×7): qty 10

## 2023-07-02 MED ORDER — CLOBAZAM 5 MG PO HALF TABLET
10.0000 mg | ORAL_TABLET | Freq: Every day | ORAL | Status: DC
  Administered 2023-07-02 – 2023-07-08 (×7): 10 mg
  Filled 2023-07-02 (×7): qty 2

## 2023-07-02 MED ORDER — ORAL CARE MOUTH RINSE: 15.0000 mL | OROMUCOSAL | Status: DC | PRN

## 2023-07-02 MED ORDER — FUROSEMIDE 10 MG/ML IJ SOLN
40.0000 mg | Freq: Once | INTRAMUSCULAR | Status: AC
  Administered 2023-07-02: 40 mg via INTRAVENOUS
  Filled 2023-07-02: qty 4

## 2023-07-02 MED ORDER — PROPAFENONE HCL 150 MG PO TABS
150.0000 mg | ORAL_TABLET | Freq: Three times a day (TID) | ORAL | Status: DC
  Administered 2023-07-02 – 2023-07-08 (×18): 150 mg
  Filled 2023-07-02 (×19): qty 1

## 2023-07-02 NOTE — Progress Notes (Signed)
eLink Physician-Brief Progress Note Patient Name: [name] DOB: Oct 02, 1956 MRN: [id number]   Date of Service  07/02/2023  HPI/Events of Note  Patient desaturated again on high flow oxygen via nasal cannula.  eICU Interventions  PCCM ground crew requested to evaluate patient  at bedside for possible intubation.        [name] Kristal Perl 07/02/2023, 3:14 AM

## 2023-07-02 NOTE — Progress Notes (Signed)
Daily Progress Note   Patient Name: [name]       Date: 07/02/2023 DOB: [birth year]  Age: 67 y.o. MRN#: [id number] Attending Physician: Coralyn Helling, MD Primary Care Physician: Jackelyn Poling, DO Admit Date: 07/13/2023  Reason for Consultation/Follow-up: Establishing goals of care  Subjective: Unresponsive  Length of Stay: 3  Current Medications: Scheduled Meds:   Chlorhexidine Gluconate Cloth  6 each Topical Q0600   [START ON 07/03/2023] cloBAZam  10 mg Per Tube Daily   cloBAZam  15 mg Per Tube QHS   feeding supplement (NEPRO CARB STEADY)  237 mL Oral TID WC   [START ON 07/03/2023] levothyroxine  100 mcg Per Tube Q0600   pantoprazole (PROTONIX) IV  40 mg Intravenous Q24H   propafenone  150 mg Per Tube Q8H   sodium chloride flush  3 mL Intravenous Q12H   sodium chloride HYPERTONIC  4 mL Nebulization BID   thiamine  100 mg Per Tube Daily    Continuous Infusions:  sodium chloride 5 mL/hr at 07/02/23 0800   sodium chloride     ampicillin-sulbactam (UNASYN) IV 3 g (07/02/23 1135)   feeding supplement (OSMOLITE 1.2 CAL)     lacosamide (VIMPAT) IV 100 mg (07/02/23 1039)   norepinephrine (LEVOPHED) Adult infusion Stopped (07/02/23 0654)    PRN Meds: sodium chloride, acetaminophen **OR** acetaminophen, ipratropium-albuterol  Physical Exam Constitutional:      General: She is not in acute distress.    Appearance: She is ill-appearing.     Comments: unresponsive  Pulmonary:     Effort: Pulmonary effort is normal.     Comments: On HFNC Skin:    General: Skin is warm and dry.             Vital Signs: BP (!) 102/54   Pulse 69   Temp (!) 96.1 F (35.6 C) (Axillary)   Resp (!) 23   Ht 5\' 6"  (1.676 m)   Wt 90.1 kg   SpO2 96%   BMI 32.06 kg/m  SpO2: SpO2: 96 % O2 Device: O2 Device:  High Flow Nasal Cannula O2 Flow Rate: O2 Flow Rate (L/min): 40 L/min  Intake/output summary:  Intake/Output Summary (Last 24 hours) at 07/02/2023 1322 Last data filed at 07/02/2023 0800 Gross per 24 hour  Intake 766.03 ml  Output 2300 ml  Net -1533.97 ml   LBM: Last BM Date : 07/02/23 Baseline Weight: Weight: 93 kg Most recent weight: Weight: 90.1 kg       Palliative Assessment/Data: PPS 10%      Patient Active Problem List   Diagnosis Date Noted   Pancytopenia (HCC) 07/01/2023   Aspiration pneumonitis (HCC) 07/16/2023   Anasarca 07/03/2023   Acute on chronic respiratory failure with hypoxia (HCC) 03/12/2023   Abnormal urinalysis 03/12/2023   Hypomagnesemia 02/03/2023   Failure to thrive in adult 02/03/2023   Syncope and collapse 02/02/2023   Hypothyroidism 02/02/2023   Sepsis secondary to UTI (HCC) 01/11/2023   Dementia (HCC) 01/11/2023   Hyponatremia 01/11/2023   Acute respiratory failure with hypoxemia (HCC) 01/11/2023   Acute metabolic encephalopathy 08/21/2022   AKI (acute kidney injury) (HCC) 08/20/2022   Seizure disorder (HCC)    History of VT (ventricular  tachycardia) (HCC) 04/29/2017   Localization-related (focal) (partial) symptomatic epilepsy and epileptic syndromes with complex partial seizures, not intractable, without status epilepticus (HCC) 02/10/2016    Palliative Care Assessment & Plan   HPI: 67 y.o. female  with past medical history of frontotemporal dementia, progressive supranuclear palsy, seizures, and TBI admitted on 07/11/2023 with hypoxia and anasarca.  Patient also with aspiration pneumonia and bilateral pleural effusions.  She is being treated with antibiotics and supplemental oxygen.  Unable to proceed with thoracentesis.  Patient with ongoing dysphagia.  PMT consulted to discuss goals of care.   Assessment: Arrived to bedside when daughter Carley Hammed also arriving to bedside. Dr. Craige Cotta joined at bedside to discuss clinical situation. Reviewed decline  overnight - requiring more support and worsening mental status. Discussed concern she may require intubation. Discussed concern that once intubated would be difficult to extubate. Discussed concern even if she was able to be extubated this cycle would repeat. Discussed concern about quality of life. After discussion spouse and daughter agreed to continue current support with HFNC and place cortrak and not proceed with intubation or chest compressions. Code status changed to DNR/DNI.  We also talked about potential outcomes. Family shares their main goal is to get patient back home. We discussed potential need for hospice support getting her home. Briefly discussed option of hospice facility depending on how patient is doing. He hopes to have her home.   Recommendations/Plan: Code status changed to DNR/DNI following conversation with CCM physician and patient's increased need for respiratory support Continue HFNC Continue with cortrak  Code Status: DNR  Care plan was discussed with CCM, patient's spouse and daughter, RN  Thank you for allowing the Palliative Medicine Team to assist in the care of this patient.  *Please note that this is a verbal dictation therefore any spelling or grammatical errors are due to the "Dragon Medical One" system interpretation.  Gerlean Ren, DNP, [facility] Palliative Medicine Team Team Phone # [phone]  Pager [phone]

## 2023-07-02 NOTE — Progress Notes (Signed)
Nutrition Follow-up  DOCUMENTATION CODES:  Not applicable  INTERVENTION:  Continue current diet as ordered per SLP Nectar Thick Mighty Shake TID, each supplement provides 330kcal and 9g protein  Nepro Shake po TID, each supplement provides 425 kcal and 19 grams protein for nectar thick shake option Once placement confirmed, initiate tube feeding via cortrak: Osmolite 1.2 at 60 ml/h (1440 ml per day) Start at 20 and advance by 10mL q12h to goal of 60 Provides 1728 kcal, 80 gm protein, 1181 ml free water daily Monitor magnesium and phosphorus every 12 hours x 4 occurrences, MD to replete as needed, as pt is at risk for refeeding syndrome given prolonged poor PO intake. Thiamine 100mg  x 7 days  NUTRITION DIAGNOSIS:  Inadequate oral intake related to lethargy/confusion as evidenced by per patient/family report. - remains applicable  GOAL:  Patient will meet greater than or equal to 90% of their needs - progressing, TF to begin today  MONITOR:  PO intake, Diet advancement, Labs, Weight trends, I & O's  REASON FOR ASSESSMENT:  Consult Assessment of nutrition requirement/status  ASSESSMENT:   Pt with hx of dementia, epilepsy, and supranuclear palsy presented to ED with worsening respiratory status and anasarca.  Pt resting in bed at the time of visit on HFNC. Husband not at bedside at the time of visit but discussed with RN. RN reports that pt took in PO yesterday but that she had worsening respiratory status overnight and that she did not plan to offer any PO today as pt is very lethargic today. XR yesterday does show worsening effusions.   As pt has had inadequate PO intake for quite some time, do feel that pt is at risk for refeeding syndrome once enteral nutrition is initiated. Discussed slow advancement with RN and will add thiamine.   Noted that PMT is working with husband on GOC discussions, Hopeful that pt's mental status and lethargy will improve after receiving  nutrition.   Intake/Output Summary (Last 24 hours) at 07/02/2023 1313 Last data filed at 07/02/2023 0800 Gross per 24 hour  Intake 766.03 ml  Output 2300 ml  Net -1533.97 ml  Net IO Since Admission: -1,301.3 mL [07/02/23 1313]  Nutritionally Relevant Medications: Scheduled Meds:  feeding supplement (NEPRO CARB STEADY)  237 mL Oral TID WC   insulin aspart  0-15 Units Subcutaneous TID WC   insulin aspart  0-5 Units Subcutaneous QHS   pantoprazole  20 mg Oral Daily   Continuous Infusions:  ampicillin-sulbactam (UNASYN) IV Stopped (07/02/23 0502)   norepinephrine (LEVOPHED) Adult infusion Stopped (07/02/23 0654)   Labs Reviewed: Na 134, chloride 91 BUN <5 CBG ranges from 90-122 mg/dL over the last 24 hours HgbA1c 5% (7/2)  NUTRITION - FOCUSED PHYSICAL EXAM: Flowsheet Row Most Recent Value  Orbital Region No depletion  Upper Arm Region No depletion  Thoracic and Lumbar Region No depletion  Buccal Region No depletion  Temple Region No depletion  Clavicle Bone Region No depletion  Clavicle and Acromion Bone Region No depletion  Scapular Bone Region No depletion  Dorsal Hand Unable to assess  [edema]  Patellar Region Unable to assess  Anterior Thigh Region Unable to assess  Posterior Calf Region Unable to assess  Edema (RD Assessment) Moderate  [significant edema to the BLE and the hands]  Hair Reviewed  Eyes Reviewed  Mouth Reviewed  Skin Reviewed  Nails Reviewed   Diet Order:   Diet Order             Diet NPO  time specified Except for: Ice Chips  Diet effective now                   EDUCATION NEEDS:   Not appropriate for education at this time  Skin:  Skin Assessment: Reviewed RN Assessment IAD - 20 cm x 20 cm   Last BM:  7/5 - type 5  Height:   Ht Readings from Last 1 Encounters:  06/30/2023 5\' 6"  (1.676 m)    Weight:   Wt Readings from Last 1 Encounters:  07/02/23 90.1 kg    Ideal Body Weight:  59.1 kg  BMI:  Body mass index is 32.06  kg/m.  Estimated Nutritional Needs:  Kcal:  1700-1900 kcal/d Protein:  80-100 g/d Fluid:  1.8L/d    Greig Castilla, RD, LDN Clinical Dietitian RD pager # available in AMION  After hours/weekend pager # available in [facility]

## 2023-07-02 NOTE — Progress Notes (Signed)
PCCM interval progress note:  Pt's O2 requirement has increased substantially overnight from [place] to HFNC 40L, she is minimally responsive and has significant secretions, though is currently maintain O2 saturations of 98%.   She is at high risk for intubation, after reviewing chart feel that if we can avoid this without first having another GOC discussion with her husband that would be ideal.  Will give Lasix 40mg , monitor closely overnight    Darcella Gasman Hermione Havlicek, PA-C

## 2023-07-02 NOTE — Procedures (Signed)
Cardiopulmonary Resuscitation Note  [name]  [id number]  1956-03-25  Date:07/02/23  Time:6:59 PM   Provider Performing:Doralyn Kirkes   Procedure: Cardiopulmonary Resuscitation (92950)  Indication(s) Loss of Pulse  Consent N/A  Anesthesia N/A   Time Out N/A   Sterile Technique Hand hygiene, gloves   Procedure Description Called to patient's room for CODE BLUE. Initial rhythm was PEA/Asystole. Patient received high quality chest compressions for 4 minutes with defibrillation or cardioversion when appropriate. Epinephrine was administered every 3 minutes as directed by time Biomedical engineer. Additional pharmacologic interventions included sodium bicarbonate. Return of spontaneous circulation was achieved.  Family at bedside.   Complications/Tolerance N/A   EBL N/A   Specimen(s) N/A  Estimated time to ROSC: 

## 2023-07-02 NOTE — Progress Notes (Signed)
Placed pt on heated highflow

## 2023-07-02 NOTE — Telephone Encounter (Signed)
Patient's husband called to cancel patient's appointment on Monday, Patient is in ICU.

## 2023-07-02 NOTE — Progress Notes (Signed)
   07/02/23 1930  Spiritual Encounters  Type of Visit Initial  Care provided to: Family  Conversation partners present during encounter Nurse;Physician  Referral source Code page  Reason for visit Code  OnCall Visit Yes  Spiritual Framework  Presenting Themes Meaning/purpose/sources of inspiration;Goals in life/care;Values and beliefs;Caregiving needs;Coping tools  Values/beliefs Strong [employer] faith  Community/Connection Family;Faith community;Spiritual leader  Strengths Family support system  Family Stress Factors Health changes;Loss of control;Major life changes  Interventions  Spiritual Care Interventions Made Compassionate presence;Reflective listening;Normalization of emotions;Explored ethical dilemma;Decision-making support/facilitation;Explored values/beliefs/practices/strengths;Encouragement  Intervention Outcomes  Outcomes Connection to spiritual care;Awareness of support   Chaplain arrived at Code Blue to find family (Husband [Darrell] of PT and Daughter [Eva]) standing outside the room holding one another and watching the medical team perform CPR.  Chaplain suggested that family might want to move away and at first they were reluctant. A few moments later an RN notified us that he cleared out the family room and we could go there.  Family agreed to go. In the family room Chaplain introduced himself and explained that I was there to provide support. Spent a few moments processing with them the violence of the CPR they had witnessed which had clearly impacted both of them. Husband began to share with chap the PT's health journey and what had lead up to the events of the evening.  The husband seemed to be focused on what he termed a "miscommunication" with the Doctor.  They had a conversation this morning where they agreed to certain "end of life" procedures not being performed for the PT should her condition worsen, but states he did not agree to a DNR.  Husband repeatedly stated that  "had we not been here to tell them to help her she wouldn't;t be here." Doctor did stop in after PT stabilized and shared with the family what to expect tonight, tomorrow, and possibly moving forward.  Decisions will need to be made.  The husband states "I am in shock right now and need some time to think." Mirna Mires helped family talk through the decisions they will be having to make, without leading them one way or the other, chaplain helped them explore the consequences of either side of the decision . Family briefly shared their [place] faith with the Chaplain and they seemed strong in the resource of faith.  Other family members are on their way to provide support as well. Chaplain explained how to reach out to Spiritual Care if they wanted a chaplain to return, and then excused himself as the family returned to PT's room.

## 2023-07-02 NOTE — Progress Notes (Signed)
eLink Physician-Brief Progress Note Patient Name: [name] DOB: 02/02/1956 MRN: [id number]   Date of Service  07/02/2023  HPI/Events of Note  Patient with recurrent transient desaturation, she does recover when a non re-breather mask is placed on her, the underlying problem seems to be low lung volumes / compressive atelectasis from her bilateral pleural effusions and poor respiratory effort but she is not a candidate for BIPAP due to her obtunded stat and copious secretions.  eICU Interventions  Will trial high flow oxygen for it's positive pressure although she is at risk of worsening hypercapnia from the oxygen. Intubation should be a last resort as the patient is extremely unlikely to separate from the ventilator successfully. Goals of care  / palliative care conversations are highly appropriate.        Thomasene Lot Coburn Knaus 07/02/2023, 2:17 AM

## 2023-07-02 NOTE — TOC Initial Note (Signed)
Transition of Care [facility]) - Initial/Assessment Note    Patient Details  Name: [name] MRN: [id number] Date of Birth: 01-12-1956  Transition of Care [facility]) CM/SW Contact:    Lorri Frederick, LCSW Phone Number: 07/02/2023, 11:55 AM  Clinical Narrative:      Pt oriented x1, CSW spoke with pt husband  Darrell.  Introduced self, explained TOC role.  Pt from home with husband.  Current home services are speech through [facility] and outpt palliative through [facility].    TOC will continue to follow for DC needs.             Expected Discharge Plan: Home w Hospice Care Barriers to Discharge: Continued Medical Work up   Patient Goals and CMS Choice            Expected Discharge Plan and Services       Living arrangements for the past 2 months: Single Family Home                                      Prior Living Arrangements/Services Living arrangements for the past 2 months: Single Family Home Lives with:: Spouse Patient language and need for interpreter reviewed:: No        Need for Family Participation in Patient Care: Yes (Comment) Care giver support system in place?: Yes (comment) Current home services: Other (comment) (Pt has speech through [facility] and Palliative through [facility]) Criminal Activity/Legal Involvement Pertinent to Current Situation/Hospitalization: No - Comment as needed  Activities of Daily Living      Permission Sought/Granted                  Emotional Assessment Appearance:: Appears stated age Attitude/Demeanor/Rapport: Unable to Assess Affect (typically observed): Unable to Assess Orientation: : Oriented to Self      Admission diagnosis:  Anasarca [R60.1] Hypoxia [R09.02] Hypothermia, initial encounter [T68.XXXA] Pneumonia of both lower lobes due to infectious organism [J18.9] Sepsis without acute organ dysfunction, due to unspecified organism [facility]) [A41.9] Patient Active Problem List    Diagnosis Date Noted   Pancytopenia (HCC) 07/14/2023   Aspiration pneumonitis (HCC) 07/06/2023   Anasarca 07/23/2023   Acute on chronic respiratory failure with hypoxia (HCC) 03/12/2023   Abnormal urinalysis 03/12/2023   Hypomagnesemia 02/03/2023   Failure to thrive in adult 02/03/2023   Syncope and collapse 02/02/2023   Hypothyroidism 02/02/2023   Sepsis secondary to UTI (HCC) 01/11/2023   Dementia (HCC) 01/11/2023   Hyponatremia 01/11/2023   Acute respiratory failure with hypoxemia (HCC) 01/11/2023   Acute metabolic encephalopathy 08/21/2022   AKI (acute kidney injury) (HCC) 08/20/2022   Seizure disorder (HCC)    History of VT (ventricular tachycardia) (HCC) 04/29/2017   Localization-related (focal) (partial) symptomatic epilepsy and epileptic syndromes with complex partial seizures, not intractable, without status epilepticus (HCC) 02/10/2016   PCP:  Jackelyn Poling, DO Pharmacy:   [id number] [place], [place] - [address] [place] [address] Phone: [phone] Fax: [phone]  [facility] [address] [place] [address] Phone: [phone] Fax: [phone]     Social Determinants of Health (SDOH) Social History: SDOH Screenings   Food Insecurity: No Food Insecurity (01/12/2023)  Housing: Low Risk  (01/12/2023)  Transportation Needs: No Transportation Needs (01/12/2023)  Utilities: Not At Risk (01/12/2023)  Tobacco Use: Low Risk  (07/11/2023)   SDOH Interventions:  Readmission Risk Interventions    02/03/2023    2:39 PM 08/24/2022   11:18 AM  Readmission Risk Prevention Plan  Post Dischage Appt  Complete  Medication Screening  Complete  Transportation Screening Complete Complete  PCP or Specialist Appt within 3-5 Days Complete   HRI or Home Care Consult Complete   Social Work Consult for Recovery Care Planning/Counseling Complete   Palliative Care Screening Complete   Medication Review Furniture conservator/restorer) Complete

## 2023-07-02 NOTE — Procedures (Signed)
Cortrak  Person Inserting Tube:  Dessire Grimes T, RD Tube Type:  Cortrak - 43 inches Tube Size:  10 Tube Location:  Left nare Secured by: Bridle Technique Used to Measure Tube Placement:  Marking at nare/corner of mouth Cortrak Secured At:  64 cm   Cortrak Tube Team Note:  Consult received to place a Cortrak feeding tube.   X-ray is required, abdominal x-ray has been ordered by the Cortrak team. Please confirm tube placement before using the Cortrak tube.   If the tube becomes dislodged please keep the tube and contact the Cortrak team at [email or web address] for replacement.  If after hours and replacement cannot be delayed, place a NG tube and confirm placement with an abdominal x-ray.    [name] RD, LDN For contact information, refer to AMiON.   

## 2023-07-02 NOTE — IPAL (Signed)
  Interdisciplinary Goals of Care Family Meeting   Date carried out:: 07/02/2023  Location of the meeting: Bedside  Member's involved: Physician, Bedside Registered Nurse, Family Member or next of kin, and Palliative care team member  Durable Power of Attorney or acting medical decision maker: Husband  Discussion: We discussed goals of care for [name] .  She has progressive dementia in the setting of frontotemporal dementia with progressive decline in functional status since having a UTI in January 2024.  She now has worsening aspiration pneumonia with increasing O2 needs and more lethargic.  Family understands the progressive nature of her neurologic disease and her poor functional status prior to this admission.  They would like to continue current therapies and see if she can tolerate cortrak placement to optimize nutritional support.  They would not want intubation, mechanical ventilation, or cardiac resuscitation if/when her clinical status gets worse.  Ultimately, they would like for her to go home with hospice if possible.  Code status: Full DNR  Disposition: Continue current acute care   Time spent for the meeting: 17 minutes  Gretchen Weinfeld 07/02/2023, 10:41 AM

## 2023-07-02 NOTE — Procedures (Signed)
Intubation Procedure Note  [name]  [id number]  [birth year]  Date:07/02/23  Time:7:01 PM   Provider Performing:Sephora Boyar    Procedure: Intubation (31500)  Indication(s) Respiratory Failure  Consent Risks of the procedure as well as the alternatives and risks of each were explained to the patient and/or caregiver.  Consent for the procedure was obtained and is signed in the bedside chart   Anesthesia Etomidate and Rocuronium   Time Out Verified patient identification, verified procedure, site/side was marked, verified correct patient position, special equipment/implants available, medications/allergies/relevant history reviewed, required imaging and test results available.   Sterile Technique Usual hand hygeine, masks, and gloves were used   Procedure Description Patient positioned in bed supine.  Sedation given as noted above.  Patient was intubated with endotracheal tube using  DL MAC4 .  View was Grade 1 full glottis .  Number of attempts was 1.  Colorimetric CO2 detector was consistent with tracheal placement.   Complications/Tolerance None; patient tolerated the procedure well. Chest X-ray is ordered to verify placement.   EBL Minimal   Specimen(s) None

## 2023-07-02 NOTE — Telephone Encounter (Signed)
Patient has sepsis and pneumonia in both lobes

## 2023-07-02 NOTE — Progress Notes (Signed)
NAME:  [name], MRN:  [id number], DOB:  08-Sep-1956, LOS: 3 ADMISSION DATE:  07/04/2023, CONSULTATION DATE:  06/30/23 REFERRING MD:  Odie Sera, MD CHIEF COMPLAINT:  Aspiration   History of Present Illness:  [name] is a 67 year old woman with frontotemporal dementia who was brought in to the ER yesterday for an increase in home O2 needs from 1 to 2L along with cough and trouble swallowing her food/drink. Her husband is her primary care taker and reports increase in lower extremity edema over the past 1 week. She has had progressive decline over recent months with concern for supranuclear palsy by her outpatient neurology team. Unfortunately she has had multiple hospital admissions over recent months with difficulty making the outpatient neurology appointments.  She was admitted yesterday by the hospitalist team and placed on a lasix drip for anasarca, aspiration and failure to thrive.. She developed hypotension earlier this morning in which lasix drip was stopped and given albumin with good effect on blood pressure. She was given PO liquid medication in the ER after passing her bedside swallow eval and then demonstrated concern for aspiration with cough and poor ability to clear her throat. She developed increased work of breathing, increased O2 need and rapid response was called. Patient responded to NT suctioning with improvement of her work of breathing but continued to require frequent suctioning. She was weaned to 6L [place].   PCCM consulted for respiratory failure and aspiration. Recommended patient be admitted to the ICU for increased level of nursing and RT needs for frequent suctioning.   Pertinent  Medical History   Past Medical History:  Diagnosis Date   Altered mental status 01/18/2023   Arthritis lower back/hands   Atypical chest pain 04/15/2017   Atypical chest pain   Brain injury (HCC)    Cancer (HCC) breast tumor removed   COVID-19 02/02/2023   Dementia (HCC)    PSD-FTD    Disorientation 01/18/2023   Hip fracture (HCC) 09/06/2019   History of epilepsy 02/02/2023   Leg fracture    Seizure disorder (HCC)    Seizures (HCC)    Sepsis (HCC) 01/11/2023   VT (ventricular tachycardia) (HCC) 04/29/2017   Significant Hospital Events: Including procedures, antibiotic start and stop dates in addition to other pertinent events   7/2 admitted to hospital  Start azithromycin 500 mg IV for five days (7/2 - 7/7)  Start Unasyn 3 g IV (7/2 - )  Chest Xray: Developing small to moderate pleural effusions. Adjacent lung opacities.  CT Head w/o: Atrophy, chronic microvascular disease. No acute intracranial abnormality. Acute on chronic sinusitis.  CTA Chest: 1. No CT evidence of pulmonary embolism. 2. Small bilateral pleural effusions, significantly increased since the prior CT. 3. Complete consolidation of the left lower lobe with partial consolidation of the right lower lobe which may represent atelectasis or pneumonia. Underlying mass is not excluded clinical correlation and follow-up to resolution recommended. 4.  Aortic Atherosclerosis.  7/3 PCCM consult - hypoxia, aspiration. Remains on pressors  7/4 - elevated valproic acid level, switched to vimpat   Interim History / Subjective:  Increased O2 requirement overnight requiring HFNC 40L with transient desat into the low 80s. Producing copious amounts of secretion while remaining minimally responsive. Did not received any oral meds due to concerns for worsening aspiration. Discussed GOC with family at bedside. Family is amenable to plan moving forward.     Objective   Blood pressure (!) 102/54, pulse 69, temperature (!) 97.2 F (36.2  C), temperature source Axillary, resp. rate (!) 23, height 5\' 6"  (1.676 m), weight 90.1 kg, SpO2 96 %.    FiO2 (%):  [50 %] 50 %   Intake/Output Summary (Last 24 hours) at 07/02/2023 1102 Last data filed at 07/02/2023 0800 Gross per 24 hour  Intake 938.48 ml  Output 2300 ml  Net  -1361.52 ml    Filed Weights   06/30/23 0432 07/01/23 0500 07/02/23 0420  Weight: 90.8 kg 92.3 kg 90.1 kg    Examination: General: Elderly, frail, lying peacefully in bed in NAD HENT: Moist oral mucosa, HFNC 40L Lungs: Decreased breath sounds bilaterally Cardiovascular: S1-S2 appreciated with no murmur Abdomen: Soft, bowel sounds appreciated, non-distended  Extremities: Warm and dry, 1+ edema bilaterally Neuro: asleep GU: n/a  Resolved Hospital Problem list     Assessment & Plan:   Aspiration pneumonia Bilateral pleural effusions Acute hypoxemic respiratory failure -Will continue Unasyn -Discontinue azithromycin -Continue submental oxygen -Does have bilateral pleural effusions but did not find a safe pocket of fluid for thoracentesis -Continue pulmonary toileting -Continue HFNC   Frontoparietal dementia Concern for supranuclear palsy -Palliative care consult -Goals of care discussed today with family   Seizure disorder Valproic acid toxicity -Valproic acid was discontinued -Started on Vimpat -Appreciate neurology involvement -Continue Onfi  Septic shock -Secondary to aspiration pneumonia -Will continue Unasyn  Hypokalemia (resolved) K+ 3.3 -> 3.8 -Potassium being replaced  Dysphagia -Cleared for nectar thick diet -Medication administered with pure -Plan for cortrak today  Pancytopenia with concern may be related to valproic acid Thrombocytopenia -Continue to monitor  Anasarca - lasix held in the setting of hypotension  - s/p albumin 25 g IV  - Pressor requirement improving, will give 40 Lasix IV  Moderate protein calorie malnutrition - consider doboff placement for tube feeds - Present on admission  Best Practice (right click and "Reselect all SmartList Selections" daily)   On Lovenox for DVT prophylaxis Full code Goals of care discussion today with palliative care  Labs   CBC: Recent Labs  Lab 07/18/2023 1559 06/30/23 0149  07/01/23 0236 07/01/23 2246 07/02/23 0128  WBC 2.7* 3.1*  3.0* 3.4*  --  3.7*  NEUTROABS 1.6* 1.9  --   --   --   HGB 10.9* 9.9*  9.8* 8.7* 8.8* 9.1*  HCT 32.5* 29.3*  29.4* 26.6* 26.0* 27.7*  MCV 95.3 95.1  95.5 96.4  --  96.9  PLT 63* 69*  68* 50*  --  51*     Basic Metabolic Panel: Recent Labs  Lab 07/27/2023 1559 06/30/23 0056 06/30/23 0149 07/01/23 0236 07/01/23 2246 07/02/23 0128  NA 131* 133* 134* 137 136 134*  K 4.2 4.0 4.0 3.3* 3.9 3.8  CL 87* 91* 90* 94*  --  91*  CO2 36* 35* 35* 36*  --  35*  GLUCOSE 74 82 80 105*  --  101*  BUN 7* 5* 5* <5*  --  <5*  CREATININE 0.83 0.72 0.72 0.72  --  0.69  CALCIUM 9.0 8.6* 8.8* 9.1  --  9.1  MG  --  1.5* 1.6* 2.2  --  1.8    GFR: Estimated Creatinine Clearance: 77.1 mL/min (by C-G formula based on SCr of 0.69 mg/dL). Recent Labs  Lab 07/27/2023 1559 06/30/2023 2021 06/30/23 0056 06/30/23 0149 07/01/23 0236 07/02/23 0128  WBC 2.7*  --   --  3.1*  3.0* 3.4* 3.7*  LATICACIDVEN  --  0.8 0.9  --  0.8  --  Liver Function Tests: Recent Labs  Lab 07/20/2023 1559 07/01/23 0236  AST 21 16  ALT 14 11  ALKPHOS 69 55  BILITOT 0.5 0.9  PROT 5.9* 5.7*  ALBUMIN 2.4* 2.8*    No results for input(s): "LIPASE", "AMYLASE" in the last 168 hours. Recent Labs  Lab 07/11/2023 1637  AMMONIA 41*     ABG    Component Value Date/Time   PHART 7.431 07/01/2023 2246   PCO2ART 59.7 (H) 07/01/2023 2246   PO2ART 80 (L) 07/01/2023 2246   HCO3 39.9 (H) 07/01/2023 2246   TCO2 42 (H) 07/01/2023 2246   O2SAT 96 07/01/2023 2246     Coagulation Profile: Recent Labs  Lab 07/27/2023 1559 06/30/23 0149  INR 1.0 1.0     Cardiac Enzymes: No results for input(s): "CKTOTAL", "CKMB", "CKMBINDEX", "TROPONINI" in the last 168 hours.  HbA1C: Hgb A1c MFr Bld  Date/Time Value Ref Range Status  07/15/2023 10:52 PM 5.0 4.8 - 5.6 % Final    Comment:    (NOTE) Pre diabetes:          5.7%-6.4%  Diabetes:               >6.4%  Glycemic control for   <7.0% adults with diabetes     CBG: Recent Labs  Lab 07/01/23 1129 07/01/23 1538 07/01/23 1658 07/01/23 2227 07/02/23 0720  GLUCAP 95 122* 113* 112* 90     Review of Systems:   As above in HPI  Past Medical History:  She,  has a past medical history of Altered mental status (01/18/2023), Arthritis (lower back/hands), Atypical chest pain (04/15/2017), Brain injury (HCC), Cancer (HCC) (breast tumor removed), COVID-19 (02/02/2023), Dementia (HCC), Disorientation (01/18/2023), Hip fracture (HCC) (09/06/2019), History of epilepsy (02/02/2023), Leg fracture, Seizure disorder (HCC), Seizures (HCC), Sepsis (HCC) (01/11/2023), and VT (ventricular tachycardia) (HCC) (04/29/2017).   Surgical History:   Past Surgical History:  Procedure Laterality Date   BREAST SURGERY     FACIAL FRACTURE SURGERY  orbital bone repair   LEFT HEART CATH AND CORONARY ANGIOGRAPHY N/A 04/30/2017   Procedure: Left Heart Cath and Coronary Angiography;  Surgeon: Marykay Lex, MD;  Location: [facility] INVASIVE CV LAB;  Service: Cardiovascular;  Laterality: N/A;   TOTAL HIP ARTHROPLASTY Left 09/07/2019   Procedure: TOTAL HIP ARTHROPLASTY ANTERIOR APPROACH;  Surgeon: Samson Frederic, MD;  Location: [facility] ORS;  Service: Orthopedics;  Laterality: Left;     Social History:   reports that she has never smoked. She has never used smokeless tobacco. She reports that she does not drink alcohol and does not use drugs.   Family History:  Her family history includes Cancer in her brother; Heart disease in her mother; Heart failure in her mother; Pancreatic cancer in her father.   Allergies No Known Allergies   The patient is critically ill with multiple organ systems failure and requires high complexity decision making for assessment and support, frequent evaluation and titration of therapies, application of advanced monitoring technologies and extensive interpretation of multiple databases. Critical  Care Time devoted to patient care services described in this note independent of APP/resident time (if applicable)  is 33 minutes.   Virl Diamond MD [place] [facility] Personal pager: See Amion If unanswered, please page CCM On-call: #[phone]

## 2023-07-02 NOTE — Progress Notes (Addendum)
Pt taken off heated HFNC 15L 50% and placed on 5L [place] by RT. Pt tolerating well at this time, No increased WOB noted, SPO2 98%, RN aware,RT will monitor as needed.    07/02/23 1450  Therapy Vitals  Pulse Rate 65  Resp 15  MEWS Score/Color  MEWS Score 2  MEWS Score Color Yellow  Respiratory Assessment  Assessment Type Assess only  Respiratory Pattern Regular;Unlabored  Chest Assessment Chest expansion symmetrical  Cough Non-productive  Sputum Amount None  Sputum Specimen Source Spontaneous cough  Bilateral Breath Sounds Diminished  Oxygen Therapy/Pulse Ox  O2 Device (S)  Nasal Cannula  O2 Therapy Oxygen humidified  O2 Flow Rate (L/min) (S)  5 L/min  SpO2 98 %

## 2023-07-02 NOTE — Progress Notes (Signed)
Pt developed wide complex tachycardia with increased respiratory distress.  Family present when this happened.  They opted to change to full code status.  Converted to sinus rhythm after getting adenosine x 2.  Intubated with improvement in oxygenation.  Updated pt's husband and daughter at bedside.  Coralyn Helling, MD [facility] Pulmonary/Critical Care Pager - [phone] or [phone] 07/02/2023, 7:05 PM

## 2023-07-02 NOTE — Progress Notes (Signed)
2130: Elink contacted d/t increased WOB and periodic desaturation. No new orders given  2230: Elink contacted again d/t increased WOB, periodic desaturation into the low 80s, and copious secretions at back of throat that patient is unable to cough up. Orders received.   0215: Elink contacted again d/t copious secretions and continued periodic desaturation. This RN highly concerned for patient's ability to protect airway d/t patient's inability to clear secretions. Patient remains with audible secretions in airway even after NT suctioning performed. Order given for [facility], which was placed on patient. This RN continues to express concern over patient protecting airway until GOC conversation able to be held in morning.

## 2023-07-03 DIAGNOSIS — J9602 Acute respiratory failure with hypercapnia: Secondary | ICD-10-CM | POA: Diagnosis not present

## 2023-07-03 DIAGNOSIS — R627 Adult failure to thrive: Secondary | ICD-10-CM

## 2023-07-03 DIAGNOSIS — J189 Pneumonia, unspecified organism: Secondary | ICD-10-CM | POA: Diagnosis not present

## 2023-07-03 DIAGNOSIS — J9601 Acute respiratory failure with hypoxia: Secondary | ICD-10-CM | POA: Diagnosis not present

## 2023-07-03 DIAGNOSIS — G3109 Other frontotemporal dementia: Secondary | ICD-10-CM | POA: Diagnosis not present

## 2023-07-03 LAB — PHOSPHORUS
Phosphorus: 1.7 mg/dL — ABNORMAL LOW (ref 2.5–4.6)
Phosphorus: 4.1 mg/dL (ref 2.5–4.6)

## 2023-07-03 LAB — BASIC METABOLIC PANEL
Anion gap: 12 (ref 5–15)
BUN: 6 mg/dL — ABNORMAL LOW (ref 8–23)
CO2: 31 mmol/L (ref 22–32)
Calcium: 8.7 mg/dL — ABNORMAL LOW (ref 8.9–10.3)
Chloride: 91 mmol/L — ABNORMAL LOW (ref 98–111)
Creatinine, Ser: 0.79 mg/dL (ref 0.44–1.00)
GFR, Estimated: >60 mL/min (ref >60)
Glucose, Bld: 152 mg/dL — ABNORMAL HIGH (ref 70–99)
Potassium: 3.1 mmol/L — ABNORMAL LOW (ref 3.5–5.1)
Sodium: 134 mmol/L — ABNORMAL LOW (ref 135–145)

## 2023-07-03 LAB — CBC
HCT: 27.2 % — ABNORMAL LOW (ref 36.0–46.0)
Hemoglobin: 9.5 g/dL — ABNORMAL LOW (ref 12.0–15.0)
MCH: 32.8 pg (ref 26.0–34.0)
MCHC: 34.9 g/dL (ref 30.0–36.0)
MCV: 93.8 fL (ref 80.0–100.0)
Platelets: 67 10*3/uL — ABNORMAL LOW (ref 150–400)
RBC: 2.9 MIL/uL — ABNORMAL LOW (ref 3.87–5.11)
RDW: 16.6 % — ABNORMAL HIGH (ref 11.5–15.5)
WBC: 5.1 10*3/uL (ref 4.0–10.5)
nRBC: 0 % (ref 0.0–0.2)

## 2023-07-03 LAB — MAGNESIUM
Magnesium: 2.4 mg/dL (ref 1.7–2.4)
Magnesium: 1.9 mg/dL (ref 1.7–2.4)

## 2023-07-03 LAB — CULTURE, BLOOD (ROUTINE X 2)

## 2023-07-03 LAB — GLUCOSE, CAPILLARY
Glucose-Capillary: 149 mg/dL — ABNORMAL HIGH (ref 70–99)
Glucose-Capillary: 159 mg/dL — ABNORMAL HIGH (ref 70–99)
Glucose-Capillary: 166 mg/dL — ABNORMAL HIGH (ref 70–99)
Glucose-Capillary: 164 mg/dL — ABNORMAL HIGH (ref 70–99)
Glucose-Capillary: 163 mg/dL — ABNORMAL HIGH (ref 70–99)

## 2023-07-03 MED ORDER — INSULIN ASPART 100 UNIT/ML IJ SOLN
0.0000 [IU] | INTRAMUSCULAR | Status: DC
  Administered 2023-07-03 – 2023-07-04 (×7): 3 [IU] via SUBCUTANEOUS
  Administered 2023-07-04: 5 [IU] via SUBCUTANEOUS
  Administered 2023-07-04: 3 [IU] via SUBCUTANEOUS
  Administered 2023-07-05: 8 [IU] via SUBCUTANEOUS
  Administered 2023-07-05 (×2): 5 [IU] via SUBCUTANEOUS

## 2023-07-03 MED ORDER — POTASSIUM PHOSPHATES 15 MMOLE/5ML IV SOLN
30.0000 mmol | Freq: Once | INTRAVENOUS | Status: AC
  Administered 2023-07-03: 30 mmol via INTRAVENOUS
  Filled 2023-07-03: qty 10

## 2023-07-03 MED ORDER — MAGNESIUM SULFATE 4 GM/100ML IV SOLN
4.0000 g | Freq: Once | INTRAVENOUS | Status: AC
  Administered 2023-07-03: 4 g via INTRAVENOUS
  Filled 2023-07-03: qty 100

## 2023-07-03 MED ORDER — LACOSAMIDE 50 MG PO TABS
100.0000 mg | ORAL_TABLET | Freq: Two times a day (BID) | ORAL | Status: DC
  Administered 2023-07-03 – 2023-07-08 (×11): 100 mg
  Filled 2023-07-03 (×11): qty 2

## 2023-07-03 MED ORDER — POTASSIUM CHLORIDE 20 MEQ PO PACK
40.0000 meq | PACK | Freq: Once | ORAL | Status: AC
  Administered 2023-07-03: 40 meq via ORAL
  Filled 2023-07-03: qty 2

## 2023-07-03 NOTE — Progress Notes (Signed)
Lacosamide returned to PharmD Rolly Salter on the unit.

## 2023-07-03 NOTE — Progress Notes (Signed)
NAME:  [name], MRN:  [id number], DOB:  Feb 11, 1956, LOS: 4 ADMISSION DATE:  07/02/2023, CONSULTATION DATE:  06/30/23 REFERRING MD:  Odie Sera, MD CHIEF COMPLAINT:  Aspiration   History of Present Illness:  67 yo female with frontotemporal dementia and VT brought to ER with progressive dysphagia and respiratory distress.  She has progressive decline in cognitive function since having a UTI in January 2024.  She was started on lasix for edema, but developed hypotension.  She developed respiratory distress after aspirating on medication and transferred to ICU.   Pertinent  Medical History  Dementia, Epilepsy, Ventricular tachycardia  Significant Hospital Events: Including procedures, antibiotic start and stop dates in addition to other pertinent events   7/2 admitted to hospital 7/3 PCCM consult - hypoxia, aspiration. Remains on pressors  7/4  elevated valproic acid level, switched to vimpat  7/5 code status reversed, VT/SVT arrest treated with adenosine, intubated 7/6 started on pressors  Interim History / Subjective:  On vent, sedation, pressors  Objective   Blood pressure (!) 91/59, pulse 77, temperature 99.8 F (37.7 C), temperature source Axillary, resp. rate 18, height 5\' 6"  (1.676 m), weight 87.7 kg, SpO2 98 %.    Vent Mode: PRVC FiO2 (%):  [40 %-100 %] 40 % Set Rate:  [18 bmp-24 bmp] 18 bmp Vt Set:  [470 mL] 470 mL PEEP:  [5 cmH20] 5 cmH20 Plateau Pressure:  [15 cmH20-19 cmH20] 19 cmH20   Intake/Output Summary (Last 24 hours) at 07/03/2023 7829 Last data filed at 07/03/2023 0800 Gross per 24 hour  Intake 2180.24 ml  Output 825 ml  Net 1355.24 ml   Filed Weights   07/01/23 0500 07/02/23 0420 07/03/23 0500  Weight: 92.3 kg 90.1 kg 87.7 kg    Examination:  General - sedated Eyes - pupils reactive ENT - ETT in place Cardiac - regular rate/rhythm, no murmur Chest - scattered rhonchi Abdomen - soft, non tender, + bowel sounds Extremities - 1 + edema Skin - no  rashes Neuro - RASS -3  Resolved Hospital Problem list     Assessment & Plan:   Acute hypoxic/hypercapnic respiratory from aspiration pneumonitis. - full vent support - f/u CXR, ABG  Aspiration pneumonitis. - day 5 of ABx - f/u blood culture from 7/02  Acute metabolic encephalopathy from hypoxia/hypercapnia. Frontoparietal dementia with concern for supranuclear palsy. Seizure disorder with valproate toxicity. - RASS goal 0 to -1 - continue onfi, vimpat  Ventricular tachycardia. - continue rhythmol  Hypotension. - from sedation and sepsis - pressors to keep MAP > 65  Hypokalemia. Hypomagnesemia. Hypophosphatemia. - replace electrolytes  Anemia of critical illness. Thrombocytopenia 2nd to sepsis. - f/u CBC  Moderate protein calorie malnutrition - tube feeds  Hypothyroidism. - continue synthroid  Hyperglycemia. - SSI  Goals of care. - palliative care consulted  Best Practice (right click and "Reselect all SmartList Selections" daily)  Nutrition: tube feeds DVT: SCD SUP: protonix Lines: N/A Foley: N/A Code status: full code Updated her husband at bedside on 7/06  Labs       Latest Ref Rng & Units 07/03/2023    6:01 AM 07/02/2023    9:56 PM 07/02/2023    9:13 PM  CMP  Glucose 70 - 99 mg/dL 562  130    BUN 8 - 23 mg/dL 6  6    Creatinine 8.65 - 1.00 mg/dL 7.84  6.96    Sodium 295 - 145 mmol/L 134  136  134   Potassium 3.5 - 5.1  mmol/L 3.1  3.0  2.6   Chloride 98 - 111 mmol/L 91  90    CO2 22 - 32 mmol/L 31  31    Calcium 8.9 - 10.3 mg/dL 8.7  9.5        Latest Ref Rng & Units 07/03/2023    6:01 AM 07/02/2023    9:13 PM 07/02/2023    1:28 AM  CBC  WBC 4.0 - 10.5 K/uL 5.1   3.7   Hemoglobin 12.0 - 15.0 g/dL 9.5  9.9  9.1   Hematocrit 36.0 - 46.0 % 27.2  29.0  27.7   Platelets 150 - 400 K/uL 67   51     ABG    Component Value Date/Time   PHART 7.723 (HH) 07/02/2023 2113   PCO2ART 24.6 (L) 07/02/2023 2113   PO2ART 244 (H) 07/02/2023 2113   HCO3  32.5 (H) 07/02/2023 2113   TCO2 33 (H) 07/02/2023 2113   O2SAT 100 07/02/2023 2113    CBG (last 3)  Recent Labs    07/02/23 2337 07/03/23 0344 07/03/23 0816  GLUCAP 150* 149* 159*   Critical care time: 36 minutes  Coralyn Helling, MD [place] [facility]monary/Critical Care Pager - [phone] or [phone] 07/03/2023, 9:21 AM

## 2023-07-03 NOTE — Progress Notes (Signed)
eLink Physician-Brief Progress Note Patient Name: TANICIA WEATHERWAX DOB: 1956-04-07 MRN: [id number]   Date of Service  07/03/2023  HPI/Events of Note  Patient is intubated in ICU status postcardiac arrest.  She is having ectopy on her monitor.  Chemistry shows a potassium of 3.0 and magnesium of 1.5. She is maxed out on peripheral Levophed.  eICU Interventions  Patient's chart reviewed.  Labs and imaging studies reviewed.  Telemetry monitor reviewed.  Camera exam done. Electrolyte replacement ordered. Current mean arterial pressure is 78.  Even though systolic blood pressure is on the 90, her mean arterial pressures suggest adequate perfusion.  No need for a second pressor agent or volume resuscitation at this time. Discussed this with patient's bedside nurse.     Intervention Category Intermediate Interventions: Electrolyte abnormality - evaluation and management  Carilyn Goodpasture 07/03/2023, 12:14 AM

## 2023-07-04 ENCOUNTER — Inpatient Hospital Stay (HOSPITAL_COMMUNITY): Payer: Medicare PPO

## 2023-07-04 DIAGNOSIS — R627 Adult failure to thrive: Secondary | ICD-10-CM | POA: Diagnosis not present

## 2023-07-04 DIAGNOSIS — J9622 Acute and chronic respiratory failure with hypercapnia: Secondary | ICD-10-CM | POA: Diagnosis not present

## 2023-07-04 DIAGNOSIS — J9601 Acute respiratory failure with hypoxia: Secondary | ICD-10-CM | POA: Diagnosis not present

## 2023-07-04 DIAGNOSIS — J189 Pneumonia, unspecified organism: Secondary | ICD-10-CM | POA: Diagnosis not present

## 2023-07-04 DIAGNOSIS — G3109 Other frontotemporal dementia: Secondary | ICD-10-CM | POA: Diagnosis not present

## 2023-07-04 DIAGNOSIS — J9621 Acute and chronic respiratory failure with hypoxia: Secondary | ICD-10-CM | POA: Diagnosis not present

## 2023-07-04 LAB — CBC
HCT: 28.3 % — ABNORMAL LOW (ref 36.0–46.0)
Hemoglobin: 9.7 g/dL — ABNORMAL LOW (ref 12.0–15.0)
MCH: 31.6 pg (ref 26.0–34.0)
MCHC: 34.3 g/dL (ref 30.0–36.0)
MCV: 92.2 fL (ref 80.0–100.0)
Platelets: 77 10*3/uL — ABNORMAL LOW (ref 150–400)
RBC: 3.07 MIL/uL — ABNORMAL LOW (ref 3.87–5.11)
RDW: 16.7 % — ABNORMAL HIGH (ref 11.5–15.5)
WBC: 4.4 10*3/uL (ref 4.0–10.5)
nRBC: 0 % (ref 0.0–0.2)

## 2023-07-04 LAB — BASIC METABOLIC PANEL
Anion gap: 9 (ref 5–15)
Anion gap: 10 (ref 5–15)
BUN: <5 mg/dL — ABNORMAL LOW (ref 8–23)
BUN: 5 mg/dL — ABNORMAL LOW (ref 8–23)
CO2: 29 mmol/L (ref 22–32)
CO2: 23 mmol/L (ref 22–32)
Calcium: 8.5 mg/dL — ABNORMAL LOW (ref 8.9–10.3)
Calcium: 8.5 mg/dL — ABNORMAL LOW (ref 8.9–10.3)
Chloride: 95 mmol/L — ABNORMAL LOW (ref 98–111)
Chloride: 100 mmol/L (ref 98–111)
Creatinine, Ser: 0.74 mg/dL (ref 0.44–1.00)
Creatinine, Ser: 0.58 mg/dL (ref 0.44–1.00)
GFR, Estimated: >60 mL/min (ref >60)
GFR, Estimated: >60 mL/min (ref >60)
Glucose, Bld: 141 mg/dL — ABNORMAL HIGH (ref 70–99)
Glucose, Bld: 229 mg/dL — ABNORMAL HIGH (ref 70–99)
Potassium: 2.6 mmol/L — CL (ref 3.5–5.1)
Potassium: 3.9 mmol/L (ref 3.5–5.1)
Sodium: 133 mmol/L — ABNORMAL LOW (ref 135–145)
Sodium: 133 mmol/L — ABNORMAL LOW (ref 135–145)

## 2023-07-04 LAB — MAGNESIUM
Magnesium: 1.9 mg/dL (ref 1.7–2.4)
Magnesium: 1.9 mg/dL (ref 1.7–2.4)

## 2023-07-04 LAB — GLUCOSE, CAPILLARY
Glucose-Capillary: 162 mg/dL — ABNORMAL HIGH (ref 70–99)
Glucose-Capillary: 165 mg/dL — ABNORMAL HIGH (ref 70–99)
Glucose-Capillary: 165 mg/dL — ABNORMAL HIGH (ref 70–99)
Glucose-Capillary: 166 mg/dL — ABNORMAL HIGH (ref 70–99)
Glucose-Capillary: 167 mg/dL — ABNORMAL HIGH (ref 70–99)
Glucose-Capillary: 216 mg/dL — ABNORMAL HIGH (ref 70–99)
Glucose-Capillary: 222 mg/dL — ABNORMAL HIGH (ref 70–99)

## 2023-07-04 LAB — HEMOGLOBIN A1C
Hgb A1c MFr Bld: 4.9 % (ref 4.8–5.6)
Mean Plasma Glucose: 93.93 mg/dL

## 2023-07-04 LAB — PHOSPHORUS
Phosphorus: 4 mg/dL (ref 2.5–4.6)
Phosphorus: 3.7 mg/dL (ref 2.5–4.6)

## 2023-07-04 LAB — CULTURE, BLOOD (ROUTINE X 2): Culture: NO GROWTH

## 2023-07-04 MED ORDER — POTASSIUM CHLORIDE 10 MEQ/100ML IV SOLN
10.0000 meq | INTRAVENOUS | Status: AC
  Administered 2023-07-04 (×3): 10 meq via INTRAVENOUS
  Filled 2023-07-04 (×4): qty 100

## 2023-07-04 MED ORDER — POTASSIUM CHLORIDE 20 MEQ PO PACK
40.0000 meq | PACK | Freq: Once | ORAL | Status: AC
  Administered 2023-07-04: 40 meq
  Filled 2023-07-04: qty 2

## 2023-07-04 MED ORDER — MAGNESIUM SULFATE 2 GM/50ML IV SOLN
2.0000 g | Freq: Once | INTRAVENOUS | Status: AC
  Administered 2023-07-04: 2 g via INTRAVENOUS
  Filled 2023-07-04: qty 50

## 2023-07-04 MED ORDER — POTASSIUM CHLORIDE 10 MEQ/100ML IV SOLN
INTRAVENOUS | Status: AC
  Administered 2023-07-04: 10 meq via INTRAVENOUS
  Filled 2023-07-04: qty 200

## 2023-07-04 MED ORDER — SODIUM CHLORIDE 0.9 % IV BOLUS
500.0000 mL | Freq: Once | INTRAVENOUS | Status: AC
  Administered 2023-07-04: 500 mL via INTRAVENOUS

## 2023-07-04 NOTE — Progress Notes (Signed)
Palliative Medicine Progress Note   Patient Name: [name]       Date: 07/04/2023 DOB: 03-19-1956  Age: 67 y.o. MRN#: [id number] Attending Physician: Coralyn Helling, MD Primary Care Physician: Jackelyn Poling, DO Admit Date: 07/04/2023  Reason for Consultation/Follow-up: {Reason for Consult:23484}  HPI/Patient Profile: 67 y.o. female with past medical history of frontotemporal dementia, progressive supranuclear palsy, seizures, and TBI admitted on 07/19/2023 with hypoxia and anasarca. Patient also with aspiration pneumonia and bilateral pleural effusions. She is being treated with antibiotics and supplemental oxygen. Unable to proceed with thoracentesis. Patient with ongoing dysphagia. PMT consulted to discuss goals of care.   Subjective: Chart reviewed. Note that patient acutely decompensated yesterday evening and DNR was rescinded by family.   Patient assessed at bedside. She remains on vasopressor support. Family is not currently at bedside. I spoke at length with patient's RN.    Objective:  Physical Exam          Vital Signs: BP 97/72   Pulse 69   Temp (!) 97.5 F (36.4 C) (Axillary)   Resp 18   Ht 5\' 6"  (1.676 m)   Wt 85.4 kg   SpO2 99%   BMI 30.39 kg/m  SpO2: SpO2: 99 % O2 Device: O2 Device: Ventilator O2 Flow Rate: O2 Flow Rate (L/min): (S) 5 L/min  Intake/output summary:  Intake/Output Summary (Last 24 hours) at 07/04/2023 1214 Last data filed at 07/04/2023 0800 Gross per 24 hour  Intake 2727.49 ml  Output 4150 ml  Net -1422.51 ml    LBM: Last BM Date : 07/03/23     Palliative Assessment/Data: ***     Palliative Medicine Assessment & Plan   Assessment: Principal Problem:   Anasarca Active Problems:   History of VT (ventricular tachycardia) (HCC)   Seizure  disorder (HCC)   Dementia (HCC)   Acute respiratory failure with hypoxemia (HCC)   Failure to thrive in adult   Acute on chronic respiratory failure with hypoxia and hypercapnia (HCC)   Pancytopenia (HCC)   Aspiration pneumonitis (HCC)    Recommendations/Plan: ***  Goals of Care and Additional Recommendations: Limitations on Scope of Treatment: {Recommended Scope and Preferences:21019}  Code Status:   Prognosis:  {Palliative Care Prognosis:23504}  Discharge Planning: {Palliative dispostion:23505}  Care plan was discussed with ***  Thank you for allowing the  Palliative Medicine Team to assist in the care of this patient.   ***   Merry Proud, NP   Please contact Palliative Medicine Team phone at [phone] for questions and concerns.  For individual providers, please see AMION.

## 2023-07-04 NOTE — Progress Notes (Signed)
[facility] ADULT ICU REPLACEMENT PROTOCOL   The patient does apply for the [facility] Adult ICU Electrolyte Replacment Protocol based on the criteria listed below:   1.Exclusion criteria: TCTS, ECMO, Dialysis, and Myasthenia Gravis patients 2. Is GFR >/= 30 ml/min? Yes.    Patient's GFR today is >60 3. Is SCr </= 2? Yes.   Patient's SCr is 0.74 mg/dL 4. Did SCr increase >/= 0.5 in 24 hours? No. 5.Pt's weight >40kg  Yes.   6. Abnormal electrolyte(s): Potassium 2.6, Magnesium 1.9  7. Electrolytes replaced per protocol 8.  Call MD STAT for K+ </= 2.5, Phos </= 1, or Mag </= 1 Physician:  Dr. Jones Broom A Ramisa Duman 07/04/2023 3:25 AM

## 2023-07-04 NOTE — Progress Notes (Signed)
NAME:  [name], MRN:  [id number], DOB:  1956/06/25, LOS: 5 ADMISSION DATE:  06/30/2023, CONSULTATION DATE:  06/30/23 REFERRING MD:  Odie Sera, MD CHIEF COMPLAINT:  Aspiration   History of Present Illness:  67 yo female with frontotemporal dementia and VT brought to ER with progressive dysphagia and respiratory distress.  She has progressive decline in cognitive function since having a UTI in January 2024.  She was started on lasix for edema, but developed hypotension.  She developed respiratory distress after aspirating on medication and transferred to ICU.   Pertinent  Medical History  Dementia, Epilepsy, Ventricular tachycardia  Significant Hospital Events: Including procedures, antibiotic start and stop dates in addition to other pertinent events   7/2 admitted to hospital 7/3 PCCM consult - hypoxia, aspiration. Remains on pressors  7/4  elevated valproic acid level, switched to vimpat  7/5 code status reversed, VT/SVT arrest treated with adenosine, intubated 7/6 started on pressors  Interim History / Subjective:  Remains on pressors, vent, sedation.  Objective   Blood pressure 99/64, pulse 65, temperature 98.4 F (36.9 C), temperature source Axillary, resp. rate 18, height 5\' 6"  (1.676 m), weight 85.4 kg, SpO2 99 %.    Vent Mode: PRVC FiO2 (%):  [40 %] 40 % Set Rate:  [18 bmp] 18 bmp Vt Set:  [470 mL] 470 mL PEEP:  [5 cmH20] 5 cmH20 Plateau Pressure:  [16 cmH20-19 cmH20] 17 cmH20   Intake/Output Summary (Last 24 hours) at 07/04/2023 1008 Last data filed at 07/04/2023 0800 Gross per 24 hour  Intake 2999.74 ml  Output 4150 ml  Net -1150.26 ml   Filed Weights   07/02/23 0420 07/03/23 0500 07/04/23 0421  Weight: 90.1 kg 87.7 kg 85.4 kg    Examination:  General - sedated Eyes - pupils reactive ENT - ETT in place Cardiac - regular rate/rhythm, no murmur Chest - equal breath sounds b/l, no wheezing or rales Abdomen - soft, non tender, + bowel sounds Extremities -  no cyanosis, clubbing, or edema Skin - no rashes Neuro - RASS 0, opens eyes spontaneously  Resolved Hospital Problem list     Assessment & Plan:   Acute hypoxic/hypercapnic respiratory from aspiration pneumonitis. - continue full vent support for now - f/u CXR intermittently  Aspiration pneumonitis. - day 6 of Abx  Acute metabolic encephalopathy from hypoxia/hypercapnia. Frontoparietal dementia with concern for supranuclear palsy. Seizure disorder with valproate toxicity. - RASS goal 0 to -1 - continue onfi, vimpat  Ventricular tachycardia. - continue rhythmol  Hypotension. - from sedation and sepsis - pressors to keep MAP > 65 - NS 500 ml IV x one on 7/07  Hypokalemia. Hypomagnesemia. Hypophosphatemia. - f/u electrolytes  Anemia of critical illness. Thrombocytopenia 2nd to sepsis. - f/u CBC  Moderate protein calorie malnutrition. Dysphagia. - tube feeds  Hypothyroidism. - continue synthroid  Hyperglycemia. - SSI  Goals of care. - palliative care consulted - her husband is hoping she can be liberated from the ventilator and then work with speech therapy.  Explained that she likely develop recurrent aspiration once she is extubated and the family needs to determine if the pt would be agreeable to trach and longer term vent support versus comfort measures  Best Practice (right click and "Reselect all SmartList Selections" daily)  Nutrition: tube feeds DVT: SCD SUP: protonix Lines: N/A Foley: N/A Code status: full code Updated her husband at bedside on 7/07  Labs       Latest Ref Rng & Units 07/04/2023  1:54 AM 07/03/2023    6:01 AM 07/02/2023    9:56 PM  CMP  Glucose 70 - 99 mg/dL 161  096  045   BUN 8 - 23 mg/dL 5  6  6    Creatinine 0.44 - 1.00 mg/dL 4.09  8.11  9.14   Sodium 135 - 145 mmol/L 133  134  136   Potassium 3.5 - 5.1 mmol/L 2.6  3.1  3.0   Chloride 98 - 111 mmol/L 95  91  90   CO2 22 - 32 mmol/L 29  31  31    Calcium 8.9 - 10.3 mg/dL  8.5  8.7  9.5       Latest Ref Rng & Units 07/04/2023    1:54 AM 07/03/2023    6:01 AM 07/02/2023    9:13 PM  CBC  WBC 4.0 - 10.5 K/uL 4.4  5.1    Hemoglobin 12.0 - 15.0 g/dL 9.7  9.5  9.9   Hematocrit 36.0 - 46.0 % 28.3  27.2  29.0   Platelets 150 - 400 K/uL 77  67      ABG    Component Value Date/Time   PHART 7.723 (HH) 07/02/2023 2113   PCO2ART 24.6 (L) 07/02/2023 2113   PO2ART 244 (H) 07/02/2023 2113   HCO3 32.5 (H) 07/02/2023 2113   TCO2 33 (H) 07/02/2023 2113   O2SAT 100 07/02/2023 2113    CBG (last 3)  Recent Labs    07/03/23 2350 07/04/23 0322 07/04/23 0808  GLUCAP 167* 162* 165*   Critical care time: 34 minutes  Coralyn Helling, MD [facility] Pager - [phone] or [phone] 07/04/2023, 10:08 AM

## 2023-07-05 ENCOUNTER — Ambulatory Visit: Payer: Medicare PPO | Admitting: Neurology

## 2023-07-05 DIAGNOSIS — J9622 Acute and chronic respiratory failure with hypercapnia: Secondary | ICD-10-CM | POA: Diagnosis not present

## 2023-07-05 DIAGNOSIS — J9621 Acute and chronic respiratory failure with hypoxia: Secondary | ICD-10-CM | POA: Diagnosis not present

## 2023-07-05 DIAGNOSIS — I472 Ventricular tachycardia, unspecified: Secondary | ICD-10-CM | POA: Diagnosis not present

## 2023-07-05 DIAGNOSIS — J189 Pneumonia, unspecified organism: Secondary | ICD-10-CM | POA: Diagnosis not present

## 2023-07-05 LAB — CBC WITH DIFFERENTIAL/PLATELET
Abs Immature Granulocytes: 0.02 10*3/uL (ref 0.00–0.07)
Basophils Absolute: 0 10*3/uL (ref 0.0–0.1)
Basophils Relative: 0 %
Eosinophils Absolute: 0.1 10*3/uL (ref 0.0–0.5)
Eosinophils Relative: 3 %
HCT: 25.7 % — ABNORMAL LOW (ref 36.0–46.0)
Hemoglobin: 8.7 g/dL — ABNORMAL LOW (ref 12.0–15.0)
Immature Granulocytes: 1 %
Lymphocytes Relative: 26 %
Lymphs Abs: 1 10*3/uL (ref 0.7–4.0)
MCH: 33.1 pg (ref 26.0–34.0)
MCHC: 33.9 g/dL (ref 30.0–36.0)
MCV: 97.7 fL (ref 80.0–100.0)
Monocytes Absolute: 0.7 10*3/uL (ref 0.1–1.0)
Monocytes Relative: 18 %
Neutro Abs: 2 10*3/uL (ref 1.7–7.7)
Neutrophils Relative %: 52 %
Platelets: UNDETERMINED 10*3/uL (ref 150–400)
RBC: 2.63 MIL/uL — ABNORMAL LOW (ref 3.87–5.11)
RDW: 17 % — ABNORMAL HIGH (ref 11.5–15.5)
WBC: 3.8 10*3/uL — ABNORMAL LOW (ref 4.0–10.5)
nRBC: 0 % (ref 0.0–0.2)

## 2023-07-05 LAB — POCT I-STAT 7, (LYTES, BLD GAS, ICA,H+H)
Acid-Base Excess: 0 mmol/L (ref 0.0–2.0)
Bicarbonate: 24.8 mmol/L (ref 20.0–28.0)
Calcium, Ion: 1.27 mmol/L (ref 1.15–1.40)
HCT: 25 % — ABNORMAL LOW (ref 36.0–46.0)
Hemoglobin: 8.5 g/dL — ABNORMAL LOW (ref 12.0–15.0)
O2 Saturation: 98 %
Patient temperature: 97.6
Potassium: 4 mmol/L (ref 3.5–5.1)
Sodium: 135 mmol/L (ref 135–145)
TCO2: 26 mmol/L (ref 22–32)
pCO2 arterial: 38.3 mmHg (ref 32–48)
pH, Arterial: 7.416 (ref 7.35–7.45)
pO2, Arterial: 102 mmHg (ref 83–108)

## 2023-07-05 LAB — CBC
HCT: 26.9 % — ABNORMAL LOW (ref 36.0–46.0)
Hemoglobin: 8.9 g/dL — ABNORMAL LOW (ref 12.0–15.0)
MCH: 32 pg (ref 26.0–34.0)
MCHC: 33.1 g/dL (ref 30.0–36.0)
MCV: 96.8 fL (ref 80.0–100.0)
Platelets: 85 10*3/uL — ABNORMAL LOW (ref 150–400)
RBC: 2.78 MIL/uL — ABNORMAL LOW (ref 3.87–5.11)
RDW: 16.9 % — ABNORMAL HIGH (ref 11.5–15.5)
WBC: 3.7 10*3/uL — ABNORMAL LOW (ref 4.0–10.5)
nRBC: 0 % (ref 0.0–0.2)

## 2023-07-05 LAB — TECHNOLOGIST SMEAR REVIEW: Plt Morphology: UNDETERMINED

## 2023-07-05 LAB — BASIC METABOLIC PANEL
Anion gap: 7 (ref 5–15)
BUN: 5 mg/dL — ABNORMAL LOW (ref 8–23)
CO2: 24 mmol/L (ref 22–32)
Calcium: 8.3 mg/dL — ABNORMAL LOW (ref 8.9–10.3)
Chloride: 101 mmol/L (ref 98–111)
Creatinine, Ser: 0.67 mg/dL (ref 0.44–1.00)
GFR, Estimated: >60 mL/min (ref >60)
Glucose, Bld: 244 mg/dL — ABNORMAL HIGH (ref 70–99)
Potassium: 4.1 mmol/L (ref 3.5–5.1)
Sodium: 132 mmol/L — ABNORMAL LOW (ref 135–145)

## 2023-07-05 LAB — GLUCOSE, CAPILLARY
Glucose-Capillary: 216 mg/dL — ABNORMAL HIGH (ref 70–99)
Glucose-Capillary: 261 mg/dL — ABNORMAL HIGH (ref 70–99)
Glucose-Capillary: 270 mg/dL — ABNORMAL HIGH (ref 70–99)
Glucose-Capillary: 281 mg/dL — ABNORMAL HIGH (ref 70–99)
Glucose-Capillary: 261 mg/dL — ABNORMAL HIGH (ref 70–99)
Glucose-Capillary: 305 mg/dL — ABNORMAL HIGH (ref 70–99)

## 2023-07-05 MED ORDER — INSULIN ASPART 100 UNIT/ML IJ SOLN
3.0000 [IU] | INTRAMUSCULAR | Status: DC
  Administered 2023-07-05 – 2023-07-06 (×6): 3 [IU] via SUBCUTANEOUS

## 2023-07-05 MED ORDER — MAGNESIUM SULFATE 2 GM/50ML IV SOLN
2.0000 g | Freq: Once | INTRAVENOUS | Status: AC
  Administered 2023-07-05: 2 g via INTRAVENOUS
  Filled 2023-07-05: qty 50

## 2023-07-05 MED ORDER — ORAL CARE MOUTH RINSE
15.0000 mL | OROMUCOSAL | Status: DC
  Administered 2023-07-05 – 2023-07-08 (×36): 15 mL via OROMUCOSAL

## 2023-07-05 MED ORDER — INSULIN ASPART 100 UNIT/ML IJ SOLN
0.0000 [IU] | INTRAMUSCULAR | Status: DC
  Administered 2023-07-05 (×3): 11 [IU] via SUBCUTANEOUS
  Administered 2023-07-06: 3 [IU] via SUBCUTANEOUS
  Administered 2023-07-06 (×2): 7 [IU] via SUBCUTANEOUS
  Administered 2023-07-06: 3 [IU] via SUBCUTANEOUS
  Administered 2023-07-06: 15 [IU] via SUBCUTANEOUS
  Administered 2023-07-07: 4 [IU] via SUBCUTANEOUS
  Administered 2023-07-07 (×2): 3 [IU] via SUBCUTANEOUS
  Administered 2023-07-07: 4 [IU] via SUBCUTANEOUS
  Administered 2023-07-07: 3 [IU] via SUBCUTANEOUS

## 2023-07-05 MED ORDER — ORAL CARE MOUTH RINSE: 15.0000 mL | OROMUCOSAL | Status: DC | PRN

## 2023-07-05 MED ORDER — SODIUM CHLORIDE 0.9 % IV SOLN: INTRAVENOUS | Status: DC | PRN

## 2023-07-05 NOTE — Procedures (Signed)
Arterial Line Insertion Start/End7/07/2023 11:35 AM, 07/05/2023 11:49 AM  Patient location: ICU. Preanesthetic checklist: patient identified, IV checked, site marked, risks and benefits discussed, surgical consent, monitors and equipment checked, pre-op evaluation and timeout performed Right, radial was placed Catheter size: 20 G Hand hygiene performed , maximum sterile barriers used  and Seldinger technique used Allen's test indicative of satisfactory collateral circulation Attempts: 2 Procedure performed without using ultrasound guided technique. Following insertion, dressing applied and Biopatch. Post procedure assessment: normal  Patient tolerated the procedure well with no immediate complications. Additional procedure comments: RT placed aline with assistance of second RT per MD order. Patient tolerated well.

## 2023-07-05 NOTE — Progress Notes (Signed)
eLink Physician-Brief Progress Note Patient Name: [name] DOB: 20-Apr-1956 MRN: [id number]   Date of Service  07/05/2023  HPI/Events of Note  67 year old female with advanced dementia that initially had runs of VT requiring amiodarone but then had a shift in her mentation and worsening respiratory failure requiring intubation.  Has persisted on low-dose norepinephrine.  Tolerated spontaneous ventilation today but her mentation precludes her from extubation.  Continue sedation was discontinued earlier today but she remains on numerous antiepileptics.  Notified by bedside staff that the patient has intermittent breath stacking and ventilator dyssynchrony.  After prolonged observation on the ventilator, the patient appears to be fairly synchronous with no additional spontaneous breaths on PRVC.    Patient also had less "vent dyssynchrony" on spontaneous breathing.  eICU Interventions  Ultimately, the goal of reducing sedation is to encourage more spontaneous neurological function.  Would maintain the patient on PRVC overnight.  The patient is having no respiratory distress and vent dyssynchrony could potentially be attributed to increased secretions or positioning-will need to evaluate when this is occurring.  If vent dyssynchrony persists despite repositioning/suctioning or patient has increased alertness, would place the patient back on spontaneous mode/pressure support ventilation.  Patient can continue pressure support ventilation throughout the night if this improves synchrony.  No clear indication for increased sedation at this time.     Intervention Category Minor Interventions: Agitation / anxiety - evaluation and management  Jakolby Sedivy 07/05/2023, 8:28 PM

## 2023-07-05 NOTE — Progress Notes (Signed)
IVT consult placed for USGPIV placement due to continued levo infusion. Per policy, patient has been receiving peripheral vasopressors close to 72 hours and central line or PICC access is recommended. Secure chat sent to primary MD, Dr. Merrily Pew who would like to wait on escalating vascular access.   Spoke with critical care pharmacist, Janice Coffin who stated that the team is aware of her peripheral access and levophed infusion dosages. She stated she is okay with re-assessing her vascular access needs in 24-48 hours. Stated that it is ok to continue to use USGPIV that is currently infusing the levophed.    iWatch is in place and IV is infusing without signs of infiltration or infection.   Primary RN will continue to use current PIV's and will request IVT be re-consulted should needs change.   Micaela Stith Loyola Mast, RN

## 2023-07-05 NOTE — Progress Notes (Signed)
Palliative Medicine Progress Note   Patient Name: [name]       Date: 07/05/2023 DOB: 05-29-1956  Age: 67 y.o. MRN#: [id number] Attending Physician: Cheri Fowler, MD Primary Care Physician: Jackelyn Poling, DO Admit Date: 07/25/2023   HPI/Patient Profile: 67 y.o. female with past medical history of frontotemporal dementia, progressive supranuclear palsy, seizures, and TBI admitted on 07/17/2023 with hypoxia and anasarca. Patient also with aspiration pneumonia and bilateral pleural effusions. She is being treated with antibiotics and supplemental oxygen. Unable to proceed with thoracentesis. Patient with ongoing dysphagia. PMT consulted to discuss goals of care.   Code status changed to DNR/DNI in family meeting 7/5. Patient acutely decompensated later that evening and DNR was reversed. Patient had VT arrest and was intubated.    Subjective: Chart reviewed, update received from RN, and patient at bedside. She remains intubated and on vasopressor support. She does not follow commands.   Husband and daughter are at bedside. They continue to process the events from 7/5. Emotional support provided. They are considering liberalizing patient from the ventilator, hoping that she can breath on her own, but with decision not to re-intubate if she doesn't. Education provided on SBT and ventilator weaning trials to assess a patient's readiness for extubation. I also provided education that if goal of care was for comfort measures only, then patient could be liberalized from the ventilator without assessing readiness for extubation and that medication would be given to prevent any pain or dyspnea.   For now, family plans to take it "one day at a time" and will make additional decisions pending patient's clinical  course.    Objective:  Physical Exam Vitals reviewed.  Constitutional:      General: She is not in acute distress.    Appearance: She is ill-appearing.  Pulmonary:     Comments: intubated Neurological:     Mental Status: She is unresponsive.             Vital Signs: BP (!) 119/57   Pulse 71   Temp 97.6 F (36.4 C) (Oral)   Resp 19   Ht 5\' 6"  (1.676 m)   Wt 85.9 kg   SpO2 99%   BMI 30.57 kg/m  SpO2: SpO2: 99 % O2 Device: O2 Device: Ventilator   Palliative Medicine Assessment & Plan   Assessment:  Principal Problem:   Anasarca Active Problems:   History of VT (ventricular tachycardia) (HCC)   Seizure disorder (HCC)   Dementia (HCC)   Acute respiratory failure with hypoxemia (HCC)   Failure to thrive in adult   Acute on chronic respiratory failure with hypoxia and hypercapnia (HCC)   Pancytopenia (HCC)   Aspiration pneumonitis (HCC)    Recommendations/Plan: Full code Continue full scope care for now PMT will continue to follow  This NP will follow-up when I return to service on 7/10, please call team phone at [phone] for urgent needs.  Prognosis:  Very guarded   Thank you for allowing the Palliative Medicine Team to assist in the care of this patient.   MDM - High   Merry Proud, NP   Please contact Palliative Medicine Team phone at [phone] for questions and concerns.  For individual providers, please see AMION.

## 2023-07-05 NOTE — Progress Notes (Signed)
NAME:  [name], MRN:  [id number], DOB:  1956-05-21, LOS: 6 ADMISSION DATE:  07/22/2023, CONSULTATION DATE:  06/30/23 REFERRING MD:  Odie Sera, MD CHIEF COMPLAINT:  Aspiration   History of Present Illness:  67 yo female with frontotemporal dementia and VT brought to ER with progressive dysphagia and respiratory distress.  She has progressive decline in cognitive function since having a UTI in January 2024.  She was started on lasix for edema, but developed hypotension.  She developed respiratory distress after aspirating on medication and transferred to ICU.   Pertinent  Medical History  Dementia, Epilepsy, Ventricular tachycardia  Significant Hospital Events: Including procedures, antibiotic start and stop dates in addition to other pertinent events   7/2 admitted to hospital 7/3 PCCM consult - hypoxia, aspiration. Remains on pressors  7/4  elevated valproic acid level, switched to vimpat  7/5 code status reversed, VT/SVT arrest treated with adenosine, intubated 7/6 started on pressors  Interim History / Subjective:   No acute events overnight.  Patient remains intubated and sedated currently on fentanyl and Levophed.  Opens eyes to voice, otherwise no other movements.  Objective   Blood pressure (!) 81/42, pulse 76, temperature 97.7 F (36.5 C), temperature source Axillary, resp. rate 18, height 5\' 6"  (1.676 m), weight 85.9 kg, SpO2 99 %.  Temperature: 97.6 F - 97.7 F, pulse 68-79, blood pressure MAP (64-79) on 6 of nor epi, satting at 99% on ventilator settings of PRVC, rate 18, PEEP 5, FiO2 40%    Vent Mode: PRVC FiO2 (%):  [40 %] 40 % Set Rate:  [18 bmp] 18 bmp Vt Set:  [470 mL] 470 mL PEEP:  [5 cmH20] 5 cmH20 Plateau Pressure:  [17 cmH20-19 cmH20] 19 cmH20   Intake/Output Summary (Last 24 hours) at 07/05/2023 0819 Last data filed at 07/05/2023 0700 Gross per 24 hour  Intake 2171.68 ml  Output 3775 ml  Net -1603.32 ml   Filed Weights   07/03/23 0500 07/04/23 0421  07/05/23 0348  Weight: 87.7 kg 85.4 kg 85.9 kg    Examination:  General - sedated, opens eyes to voice Eyes -pupils equal and reactive to light ENT - ETT in place Cardiac - r regular rate, regular rhythm, no murmurs appreciated  Chest -coarse breath sounds heard throughout, scattered rhonchi Abdomen -soft abdomen, normal active bowel sounds Extremities -1+ pitting edema bilaterally, 2+ pedal pulses appreciated bilaterally Skin - no rashes Neuro -open eyes to voice, unable to follow instructions  Labs: BMP: Sodium 132 (133), potassium 4.1 (3.9), bicarb 24, creatinine 0.67 CBC: White count 3.7 (5.1), hemoglobin 8.9 (9.7), platelet 85 Glucose: 165-222  Blood culture: No growth for 5 days  Chest x-ray: 07/04/2023: Bibasilar collapse/consolidation, left greater than right  Resolved Hospital Problem list     Assessment & Plan:   Acute hypoxic/hypercapnic respiratory from aspiration pneumonitis. Patient evaluated bedside this morning.  She still requiring full ventilator support.  Chest x-ray showing worsening opacities.  Unable to wean down vent settings at this time.  Given this is day 6 of antibiotics, I do wonder if there is some other process going on.  Patient does have increasing opacity these, wonder if this could be related to inflammation.  Wonder if steroids could play a role in patient's therapy. -Continue to wean vent as tolerated -Follow chest x-ray intermittently -Vent bundle -Consider steroids today -Finished antibiotics -Urine culture, respiratory culture  Acute metabolic encephalopathy from hypoxia/hypercapnia. Frontoparietal dementia with concern for supranuclear palsy. Seizure disorder with valproate toxicity.  Patient no longer having seizures.  Patient does have a strong history of frontoparietal dementia with concern of supranuclear palsy.  Unclear what patient's baseline is.  Dementia is progressing however.  Patient remains without seizures. -Continue Onfi  10 mg daily, and 15 mg nightly -Continue Vimpat 100 mg every 12 hours -Mag goal greater than 2 -Titrate off fentanyl  Ventricular tachycardia. Patient did have V. tach, with pulse and code was called.  Patient was given adenosine.  Patient also was intubated at the time.  Currently having some intermittent V. tach runs. -Continue rhythmol  Hypotension. Patient still requiring Levophed at 6 mcg.  Likely etiology secondary to sepsis as well as underlying sedation. - pressors to keep MAP > 65 -Wean sedation today   Hypokalemia. Hypomagnesemia. Hypophosphatemia. Electrolyte derangements have improved from yesterday. -Continue to monitor electrolytes  Anemia of critical illness. Thrombocytopenia 2nd to sepsis. Leukopenia Initially thought to have pancytopenia secondary to valproic acid.  That has since been transitioned off.  Did have decreased white count this morning.  Could be reactive.  Other cell lines improving. -Monitor CBC -Obtained smear  Moderate protein calorie malnutrition. Dysphagia. Underlying dementia likely etiology of dysphagia. -Continue tube feeds  Hypothyroidism. - continue synthroid  Hyperglycemia. Glucose elevated. Will plan to increase to resistant scale.   Goals of care. - palliative care consulted - her husband is hoping she can be liberated from the ventilator and then work with speech therapy.  Explained that she likely develop recurrent aspiration once she is extubated and the family needs to determine if the pt would be agreeable to trach and longer term vent support versus comfort measures  Best Practice (right click and "Reselect all SmartList Selections" daily)  Nutrition: tube feeds DVT: SCD SUP: protonix Lines: N/A Foley: N/A Code status: full code Updated her husband at bedside on 7/07  Labs       Latest Ref Rng & Units 07/05/2023    5:31 AM 07/05/2023    1:23 AM 07/04/2023    5:19 PM  CMP  Glucose 70 - 99 mg/dL  409  811   BUN 8 -  23 mg/dL  5  5   Creatinine 9.14 - 1.00 mg/dL  7.82  9.56   Sodium 213 - 145 mmol/L 135  132  133   Potassium 3.5 - 5.1 mmol/L 4.0  4.1  3.9   Chloride 98 - 111 mmol/L  101  100   CO2 22 - 32 mmol/L  24  23   Calcium 8.9 - 10.3 mg/dL  8.3  8.5       Latest Ref Rng & Units 07/05/2023    5:31 AM 07/05/2023    1:23 AM 07/04/2023    1:54 AM  CBC  WBC 4.0 - 10.5 K/uL  3.7  4.4   Hemoglobin 12.0 - 15.0 g/dL 8.5  8.9  9.7   Hematocrit 36.0 - 46.0 % 25.0  26.9  28.3   Platelets 150 - 400 K/uL  85  77     ABG    Component Value Date/Time   PHART 7.416 07/05/2023 0531   PCO2ART 38.3 07/05/2023 0531   PO2ART 102 07/05/2023 0531   HCO3 24.8 07/05/2023 0531   TCO2 26 07/05/2023 0531   O2SAT 98 07/05/2023 0531    CBG (last 3)  Recent Labs    07/04/23 2328 07/05/23 0322 07/05/23 0753  GLUCAP 222* 216* 261*   Critical care time: 34 minutes  Modena Slater, DO Internal Medicine Resident PGY-2  [phone] 07/05/2023, 8:19 AM

## 2023-07-05 NOTE — Progress Notes (Addendum)
Palliative Care   Met with family to provided emotional and general support through therapeutic listening, empathy, sharing of stories. Daughter talked about the trauma in watching her mother be coded. Husband states that they are waiting to see how patient does as they attempt to wean her off of ventilator before further discussions regarding code status. Reinforced that palliative team can be reached on the team phone if they have questions or concerns.  Barrie Folk MS Ed.S, RN Palliative Medicine Team  Team Phone: [phone]

## 2023-07-06 ENCOUNTER — Inpatient Hospital Stay (HOSPITAL_COMMUNITY): Payer: Medicare PPO

## 2023-07-06 DIAGNOSIS — R601 Generalized edema: Secondary | ICD-10-CM

## 2023-07-06 DIAGNOSIS — A419 Sepsis, unspecified organism: Secondary | ICD-10-CM

## 2023-07-06 DIAGNOSIS — J189 Pneumonia, unspecified organism: Secondary | ICD-10-CM | POA: Diagnosis not present

## 2023-07-06 DIAGNOSIS — R578 Other shock: Secondary | ICD-10-CM | POA: Diagnosis not present

## 2023-07-06 DIAGNOSIS — J9621 Acute and chronic respiratory failure with hypoxia: Secondary | ICD-10-CM | POA: Diagnosis not present

## 2023-07-06 DIAGNOSIS — J9622 Acute and chronic respiratory failure with hypercapnia: Secondary | ICD-10-CM | POA: Diagnosis not present

## 2023-07-06 LAB — PROTEIN ELECTROPHORESIS, SERUM
A/G Ratio: 1 (ref 0.7–1.7)
Albumin ELP: 2.5 g/dL — ABNORMAL LOW (ref 2.9–4.4)
Alpha-1-Globulin: 0.3 g/dL (ref 0.0–0.4)
Alpha-2-Globulin: 0.4 g/dL (ref 0.4–1.0)
Beta Globulin: 0.8 g/dL (ref 0.7–1.3)
Gamma Globulin: 1.2 g/dL (ref 0.4–1.8)
Globulin, Total: 2.6 g/dL (ref 2.2–3.9)
Total Protein ELP: 5.1 g/dL — ABNORMAL LOW (ref 6.0–8.5)

## 2023-07-06 LAB — CBC
HCT: 24.9 % — ABNORMAL LOW (ref 36.0–46.0)
Hemoglobin: 8.4 g/dL — ABNORMAL LOW (ref 12.0–15.0)
MCH: 32.7 pg (ref 26.0–34.0)
MCHC: 33.7 g/dL (ref 30.0–36.0)
MCV: 96.9 fL (ref 80.0–100.0)
Platelets: 105 10*3/uL — ABNORMAL LOW (ref 150–400)
RBC: 2.57 MIL/uL — ABNORMAL LOW (ref 3.87–5.11)
RDW: 16.9 % — ABNORMAL HIGH (ref 11.5–15.5)
WBC: 4.6 10*3/uL (ref 4.0–10.5)
nRBC: 0 % (ref 0.0–0.2)

## 2023-07-06 LAB — ECHOCARDIOGRAM COMPLETE
AR max vel: 1.85 cm2
AV Area VTI: 1.83 cm2
AV Area mean vel: 1.78 cm2
AV Mean grad: 5 mmHg
AV Peak grad: 9.1 mmHg
Ao pk vel: 1.51 m/s
Area-P 1/2: 3.77 cm2
Height: 66 in
S' Lateral: 3.3 cm
Weight: 3040.58 oz

## 2023-07-06 LAB — GLUCOSE, CAPILLARY
Glucose-Capillary: 218 mg/dL — ABNORMAL HIGH (ref 70–99)
Glucose-Capillary: 206 mg/dL — ABNORMAL HIGH (ref 70–99)
Glucose-Capillary: 150 mg/dL — ABNORMAL HIGH (ref 70–99)
Glucose-Capillary: 99 mg/dL (ref 70–99)
Glucose-Capillary: 122 mg/dL — ABNORMAL HIGH (ref 70–99)
Glucose-Capillary: 188 mg/dL — ABNORMAL HIGH (ref 70–99)

## 2023-07-06 LAB — BASIC METABOLIC PANEL
Anion gap: 6 (ref 5–15)
BUN: 8 mg/dL (ref 8–23)
CO2: 25 mmol/L (ref 22–32)
Calcium: 8.4 mg/dL — ABNORMAL LOW (ref 8.9–10.3)
Chloride: 102 mmol/L (ref 98–111)
Creatinine, Ser: 0.6 mg/dL (ref 0.44–1.00)
GFR, Estimated: >60 mL/min (ref >60)
Glucose, Bld: 244 mg/dL — ABNORMAL HIGH (ref 70–99)
Potassium: 4.1 mmol/L (ref 3.5–5.1)
Sodium: 133 mmol/L — ABNORMAL LOW (ref 135–145)

## 2023-07-06 LAB — MAGNESIUM: Magnesium: 1.8 mg/dL (ref 1.7–2.4)

## 2023-07-06 LAB — URINE CULTURE: Culture: NO GROWTH

## 2023-07-06 LAB — PHOSPHORUS: Phosphorus: 4.7 mg/dL — ABNORMAL HIGH (ref 2.5–4.6)

## 2023-07-06 MED ORDER — INSULIN GLARGINE-YFGN 100 UNIT/ML ~~LOC~~ SOLN
10.0000 [IU] | Freq: Every day | SUBCUTANEOUS | Status: DC
  Administered 2023-07-06 – 2023-07-07 (×2): 10 [IU] via SUBCUTANEOUS
  Filled 2023-07-06 (×3): qty 0.1

## 2023-07-06 MED ORDER — POLYETHYLENE GLYCOL 3350 17 G PO PACK
17.0000 g | PACK | Freq: Two times a day (BID) | ORAL | Status: DC
  Administered 2023-07-06: 17 g
  Filled 2023-07-06: qty 1

## 2023-07-06 MED ORDER — PIPERACILLIN-TAZOBACTAM 3.375 G IVPB
3.3750 g | Freq: Three times a day (TID) | INTRAVENOUS | Status: DC
  Administered 2023-07-06 – 2023-07-08 (×6): 3.375 g via INTRAVENOUS
  Filled 2023-07-06 (×7): qty 50

## 2023-07-06 MED ORDER — MAGNESIUM SULFATE 2 GM/50ML IV SOLN
2.0000 g | Freq: Once | INTRAVENOUS | Status: AC
  Administered 2023-07-06: 2 g via INTRAVENOUS
  Filled 2023-07-06: qty 50

## 2023-07-06 MED ORDER — INSULIN ASPART 100 UNIT/ML IJ SOLN
6.0000 [IU] | INTRAMUSCULAR | Status: DC
  Administered 2023-07-06 – 2023-07-08 (×7): 6 [IU] via SUBCUTANEOUS

## 2023-07-06 MED ORDER — ENOXAPARIN SODIUM 40 MG/0.4ML IJ SOSY
40.0000 mg | PREFILLED_SYRINGE | Freq: Every day | INTRAMUSCULAR | Status: DC
  Administered 2023-07-06 – 2023-07-08 (×3): 40 mg via SUBCUTANEOUS
  Filled 2023-07-06 (×3): qty 0.4

## 2023-07-06 MED ORDER — SENNA 8.6 MG PO TABS: 1.0000 | ORAL_TABLET | Freq: Every day | ORAL | Status: DC

## 2023-07-06 MED ORDER — GLUCERNA 1.5 CAL PO LIQD
1000.0000 mL | ORAL | Status: DC
  Administered 2023-07-06 – 2023-07-08 (×3): 1000 mL
  Filled 2023-07-06: qty 1185
  Filled 2023-07-06: qty 1000
  Filled 2023-07-06 (×2): qty 1185
  Filled 2023-07-06: qty 1000

## 2023-07-06 MED ORDER — MIDODRINE HCL 5 MG PO TABS
20.0000 mg | ORAL_TABLET | Freq: Three times a day (TID) | ORAL | Status: DC
  Administered 2023-07-06 – 2023-07-08 (×6): 20 mg
  Filled 2023-07-06 (×6): qty 4

## 2023-07-06 MED ORDER — HYDROCORTISONE SOD SUC (PF) 100 MG IJ SOLR
100.0000 mg | Freq: Two times a day (BID) | INTRAMUSCULAR | Status: DC
  Administered 2023-07-06 – 2023-07-07 (×2): 100 mg via INTRAVENOUS
  Filled 2023-07-06 (×2): qty 2

## 2023-07-06 NOTE — Progress Notes (Signed)
NAME:  [name], MRN:  [id number], DOB:  [birth year], LOS: 7 ADMISSION DATE:  07/15/2023, CONSULTATION DATE:  06/30/23 REFERRING MD:  Odie Sera, MD CHIEF COMPLAINT:  Aspiration   History of Present Illness:  67 yo female with frontotemporal dementia and VT brought to ER with progressive dysphagia and respiratory distress.  She has progressive decline in cognitive function since having a UTI in January 2024.  She was started on lasix for edema, but developed hypotension.  She developed respiratory distress after aspirating on medication and transferred to ICU.   Pertinent  Medical History  Dementia, Epilepsy, Ventricular tachycardia  Significant Hospital Events: Including procedures, antibiotic start and stop dates in addition to other pertinent events   7/2 admitted to hospital 7/3 PCCM consult - hypoxia, aspiration. Remains on pressors  7/4  elevated valproic acid level, switched to vimpat  7/5 code status reversed, VT/SVT arrest treated with adenosine, intubated 7/6 started on pressors  Interim History / Subjective:   Overnight patient switched back to [facility].   Patient evaluated at bedside this morning. Not able to follow commands. Patient is not on any sedation.   Objective   Blood pressure (!) 100/58, pulse 86, temperature 98.5 F (36.9 C), temperature source Axillary, resp. rate 15, height 5\' 6"  (1.676 m), weight 86.2 kg, SpO2 98 %.      Vent Mode: PSV;CPAP FiO2 (%):  [40 %] 40 % Set Rate:  [18 bmp] 18 bmp Vt Set:  [470 mL] 470 mL PEEP:  [5 cmH20] 5 cmH20 Pressure Support:  [8 cmH20-10 cmH20] 10 cmH20 Plateau Pressure:  [22 cmH20] 22 cmH20   Intake/Output Summary (Last 24 hours) at 07/06/2023 1151 Last data filed at 07/06/2023 1000 Gross per 24 hour  Intake 2162.77 ml  Output 2420 ml  Net -257.23 ml   Filed Weights   07/04/23 0421 07/05/23 0348 07/06/23 0430  Weight: 85.4 kg 85.9 kg 86.2 kg    Examination:  General - asleep, awakes to voices, not following any  instructions Eyes -pupils equal, and reactive to light ENT - ETT in place Cardiac -regular rate and rhythm Chest -ventilated breath sounds bilaterally, scattered crackles Abdomen -soft nontender, nondistended, normoactive bowel sounds Extremities - 1+ pitting edema bilaterally, 2+ pedal pulses bilaterally Skin - no rashes Neuro -not able to follow any instructions, able to open eyes to voice  Labs: BMP:  sodium 133, glucose 244, creatinine 0.60 CBC: White count 4.6, hemoglobin 8.4, platelet 105 Magnesium: 1.8 Phosphorus 4.7 Glucose 150-305  Respiratory culture growing moderate gram-negative rods, moderate yeast  2.6 L output  Resolved Hospital Problem list     Assessment & Plan:   Acute hypoxic/hypercapnic respiratory from aspiration pneumonitis. Patient still on ventilator.  Not able to titrate off ventilator.  Respiratory cultures growing gram-negative rods.  Yeast likely colonization.  Will plan to follow cultures.  Will try to to have patient on pressure support.  Patient did complete Unasyn course. -Start Zosyn day 1  -Follow-up chest ray intermittently -VAP bundle  -Respiratory culture  -Fever curve and white couynt  -Continue to wean vent as tolerated  Acute metabolic encephalopathy from hypoxia/hypercapnia. 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 Seizure disorder with valproate toxicity. Acute encephalopathy secondary to underlying dementia.  Could also be related to underlying infection.  Given sputum cultures growing gram-negative rods, will likely start Zosyn.  -Continue Onfi 10 mg daily, and 15 mg nightly -Continue Vimpat 100 mg every 12 hours -Mag goal greater than 2 -Titrate off fentanyl -Start  Zosyn day 1  Ventricular tachycardia. No longer having concern for V. tach, but does start PVCs. -Continue Rythmol  Hypotension. Sedation has been weaned off, and patient is still hypotensive.  Patient currently on Levophed at 4 mcg.  Could  be related to underlying steroid deficiency, could also be related to underlying infection. -SBP goal greater than 90 -Titrate Levophed to goal, anticipate weaning off Levophed as tolerated -Echo pending  Hypokalemia. Hypomagnesemia. Hypophosphatemia. Electrolyte derangements improving. -Continue to monitor electrolytes  Anemia of critical illness. Thrombocytopenia 2nd to sepsis. Leukopenia Smear did not show any etiology of leukopenia.  Did show clumped platelets.  Hemoglobin, platelet, white count stable. -Monitor CBC -Monitor for worsening cell counts   Moderate protein calorie malnutrition. Dysphagia. Underlying dementia likely etiology of dysphagia. -Continue tube feeds  Hypothyroidism. - continue synthroid  Hyperglycemia. Glucose continues to be elevated -Will continue to titrate sliding scale  Goals of care. -Palliative care following -If patient does require vent long period, and needs to be transition to trach, patient does have a MOST form stating that patient would not want to be on trach Best Practice (right click and "Reselect all SmartList Selections" daily)  Nutrition: tube feeds DVT: SCD SUP: protonix Lines: N/A Foley: N/A Code status: full code Updated her husband at bedside on 7/07  Labs       Latest Ref Rng & Units 07/06/2023    4:29 AM 07/05/2023    5:31 AM 07/05/2023    1:23 AM  CMP  Glucose 70 - 99 mg/dL 454   098   BUN 8 - 23 mg/dL 8   5   Creatinine 1.19 - 1.00 mg/dL 1.47   8.29   Sodium 562 - 145 mmol/L 133  135  132   Potassium 3.5 - 5.1 mmol/L 4.1  4.0  4.1   Chloride 98 - 111 mmol/L 102   101   CO2 22 - 32 mmol/L 25   24   Calcium 8.9 - 10.3 mg/dL 8.4   8.3       Latest Ref Rng & Units 07/06/2023    4:29 AM 07/05/2023    1:28 PM 07/05/2023    5:31 AM  CBC  WBC 4.0 - 10.5 K/uL 4.6  3.8    Hemoglobin 12.0 - 15.0 g/dL 8.4  8.7  8.5   Hematocrit 36.0 - 46.0 % 24.9  25.7  25.0   Platelets 150 - 400 K/uL 105  PLATELET CLUMPS NOTED ON  SMEAR, UNABLE TO ESTIMATE      ABG    Component Value Date/Time   PHART 7.416 07/05/2023 0531   PCO2ART 38.3 07/05/2023 0531   PO2ART 102 07/05/2023 0531   HCO3 24.8 07/05/2023 0531   TCO2 26 07/05/2023 0531   O2SAT 98 07/05/2023 0531    CBG (last 3)  Recent Labs    07/06/23 0333 07/06/23 0745 07/06/23 1106  GLUCAP 218* 206* 150*   Critical care time: 34 minutes  Modena Slater, DO Internal Medicine Resident PGY-2 [phone] 07/06/2023, 11:51 AM

## 2023-07-06 NOTE — Progress Notes (Signed)
Nutrition Follow-up  DOCUMENTATION CODES:  Not applicable  INTERVENTION:  Continue tube feeding via cortrak. Adjust to glucerna due to elevated CBGs: Glucerna 1.5 at 50 ml/h (1200 ml per day) Provides 1800 kcal, 99 gm protein, 966 ml free water daily  NUTRITION DIAGNOSIS:  Inadequate oral intake related to lethargy/confusion as evidenced by per patient/family report. - remains applicable  GOAL:  Patient will meet greater than or equal to 90% of their needs - progressing, TF at goal meets needs  MONITOR:  PO intake, Diet advancement, Labs, Weight trends, I & O's  REASON FOR ASSESSMENT:  Consult Assessment of nutrition requirement/status  ASSESSMENT:   Pt with hx of dementia, epilepsy, and supranuclear palsy presented to ED with worsening respiratory status and anasarca.  7/2 - presented to ED 7/5 - cortrak placed (gastric), later in shift, code blue, CPR, intubated  Pt intubated on vent support at this time. TF infusing at goal via cortrak. Noted that pt's CBGs are very elevated and have remained between 200-300 over the last 48 hours. Will adjust to a lower carb formula to aid in controlling. Discussed with RN.   Pt is requiring some low pressor support which has been weaned over the last 24 hours. Meeting MAP goals at this time.   Noted that family is working with PMT and note that seeing pt undergo CPR was traumatic. Pt remains full code at this time. Family hopeful for ability to wean from the vent in the coming days.   Intake/Output Summary (Last 24 hours) at 07/06/2023 1045 Last data filed at 07/06/2023 1000 Gross per 24 hour  Intake 2376.74 ml  Output 2420 ml  Net -43.26 ml  Net IO Since Admission: -2,758.65 mL [07/06/23 1045]  Nutritionally Relevant Medications: Scheduled Meds:  docusate  100 mg BID   insulin aspart  0-20 Units Q4H   insulin aspart  6 Units Q4H   pantoprazole IV  40 mg Q24H   polyethylene glycol  17 g BID   thiamine  100 mg Daily    Continuous Infusions:  feeding supplement (GLUCERNA 1.5 CAL) 50 mL/hr at 07/06/23 1000   norepinephrine (LEVOPHED) Adult infusion 3.5 mcg/min (07/06/23 1000)   piperacillin-tazobactam (ZOSYN)  IV     Labs Reviewed: Na 133 Phosphorus 4.7 CBG ranges from 206-305 mg/dL over the last 24 hours HgbA1c 5% (7/2)  NUTRITION - FOCUSED PHYSICAL EXAM: Flowsheet Row Most Recent Value  Orbital Region No depletion  Upper Arm Region No depletion  Thoracic and Lumbar Region No depletion  Buccal Region No depletion  Temple Region No depletion  Clavicle Bone Region No depletion  Clavicle and Acromion Bone Region No depletion  Scapular Bone Region No depletion  Dorsal Hand Unable to assess  [edema]  Patellar Region Unable to assess  Anterior Thigh Region Unable to assess  Posterior Calf Region Unable to assess  Edema (RD Assessment) Moderate  [significant edema to the BLE and the hands]  Hair Reviewed  Eyes Reviewed  Mouth Reviewed  Skin Reviewed  Nails Reviewed   Diet Order:   Diet Order             Diet NPO time specified  Diet effective now                   EDUCATION NEEDS:   Not appropriate for education at this time  Skin:  Skin Assessment: Reviewed RN Assessment IAD - 20 cm x 20 cm   Last BM:  7/9 - type 5  Height:   Ht Readings from Last 1 Encounters:  07/20/2023 5\' 6"  (1.676 m)    Weight:   Wt Readings from Last 1 Encounters:  07/06/23 86.2 kg    Ideal Body Weight:  59.1 kg  BMI:  Body mass index is 30.67 kg/m.  Estimated Nutritional Needs:  Kcal:  1700-1900 kcal/d Protein:  80-100 g/d Fluid:  1.8L/d    Greig Castilla, RD, LDN Clinical Dietitian RD pager # available in AMION  After hours/weekend pager # available in [facility]

## 2023-07-07 ENCOUNTER — Inpatient Hospital Stay (HOSPITAL_COMMUNITY): Payer: Medicare PPO

## 2023-07-07 DIAGNOSIS — J189 Pneumonia, unspecified organism: Secondary | ICD-10-CM | POA: Diagnosis not present

## 2023-07-07 DIAGNOSIS — G3109 Other frontotemporal dementia: Secondary | ICD-10-CM | POA: Diagnosis not present

## 2023-07-07 DIAGNOSIS — J9622 Acute and chronic respiratory failure with hypercapnia: Secondary | ICD-10-CM | POA: Diagnosis not present

## 2023-07-07 DIAGNOSIS — J9621 Acute and chronic respiratory failure with hypoxia: Secondary | ICD-10-CM | POA: Diagnosis not present

## 2023-07-07 DIAGNOSIS — A419 Sepsis, unspecified organism: Secondary | ICD-10-CM | POA: Diagnosis not present

## 2023-07-07 DIAGNOSIS — R627 Adult failure to thrive: Secondary | ICD-10-CM | POA: Diagnosis not present

## 2023-07-07 LAB — BASIC METABOLIC PANEL
Anion gap: 11 (ref 5–15)
BUN: 15 mg/dL (ref 8–23)
CO2: 25 mmol/L (ref 22–32)
Calcium: 8.6 mg/dL — ABNORMAL LOW (ref 8.9–10.3)
Chloride: 99 mmol/L (ref 98–111)
Creatinine, Ser: 0.68 mg/dL (ref 0.44–1.00)
GFR, Estimated: >60 mL/min (ref >60)
Glucose, Bld: 189 mg/dL — ABNORMAL HIGH (ref 70–99)
Potassium: 4 mmol/L (ref 3.5–5.1)
Sodium: 135 mmol/L (ref 135–145)

## 2023-07-07 LAB — CBC
HCT: 26.5 % — ABNORMAL LOW (ref 36.0–46.0)
Hemoglobin: 8.8 g/dL — ABNORMAL LOW (ref 12.0–15.0)
MCH: 32 pg (ref 26.0–34.0)
MCHC: 33.2 g/dL (ref 30.0–36.0)
MCV: 96.4 fL (ref 80.0–100.0)
Platelets: 130 10*3/uL — ABNORMAL LOW (ref 150–400)
RBC: 2.75 MIL/uL — ABNORMAL LOW (ref 3.87–5.11)
RDW: 17.2 % — ABNORMAL HIGH (ref 11.5–15.5)
WBC: 5 10*3/uL (ref 4.0–10.5)
nRBC: 0 % (ref 0.0–0.2)

## 2023-07-07 LAB — GLUCOSE, CAPILLARY
Glucose-Capillary: 166 mg/dL — ABNORMAL HIGH (ref 70–99)
Glucose-Capillary: 137 mg/dL — ABNORMAL HIGH (ref 70–99)
Glucose-Capillary: 135 mg/dL — ABNORMAL HIGH (ref 70–99)
Glucose-Capillary: 127 mg/dL — ABNORMAL HIGH (ref 70–99)
Glucose-Capillary: 96 mg/dL (ref 70–99)

## 2023-07-07 MED ORDER — HYDROCORTISONE SOD SUC (PF) 100 MG IJ SOLR
100.0000 mg | Freq: Every day | INTRAMUSCULAR | Status: DC
  Administered 2023-07-08: 100 mg via INTRAVENOUS
  Filled 2023-07-07: qty 2

## 2023-07-07 NOTE — Progress Notes (Signed)
Palliative Medicine Progress Note   Patient Name: [name]       Date: 07/07/2023 DOB: April 24, 1956  Age: 67 y.o. MRN#: [id number] Attending Physician: Cheri Fowler, MD Primary Care Physician: Jackelyn Poling, DO Admit Date: 07/21/2023    HPI/Patient Profile: 67 y.o. female with past medical history of frontotemporal dementia, progressive supranuclear palsy, seizures, and TBI admitted on 06/28/2023 with hypoxia and anasarca. Patient also with aspiration pneumonia and bilateral pleural effusions. She is being treated with antibiotics and supplemental oxygen. Unable to proceed with thoracentesis. Patient with ongoing dysphagia. PMT consulted to discuss goals of care.    Code status changed to DNR/DNI in family meeting 7/5. Patient acutely decompensated later that evening and DNR was reversed. Patient had VT arrest and was intubated.    Subjective: Chart reviewed. Patient failed spontaneous breathing trial due to apnea this morning. Note GOC meeting with patient's family and PCCM earlier today; family indicating that do not want to proceed with trach/PEG as per patient's wishes.   I briefly spoke with husband outside the patient's room. He confirms plan to liberalize her from the ventilator tomorrow. He reports they wanted to allow her the opportunity for improvement, but that unfortunately her body is tired. Emotional support provided.    Objective:  Physical Exam Vitals reviewed.  Constitutional:      General: She is not in acute distress.    Appearance: She is ill-appearing.  Pulmonary:     Comments: intubated Neurological:     Comments: Opens her eyes, but not tracking or following commands             Vital Signs: BP (!) 89/52   Pulse 64   Temp (!) 97.5 F (36.4 C) (Axillary)    Resp 18   Ht 5\' 6"  (1.676 m)   Wt 86.2 kg   SpO2 99%   BMI 30.67 kg/m  SpO2: SpO2: 99 % O2 Device: O2 Device: Ventilator   Palliative Medicine Assessment & Plan   Assessment: Principal Problem:   Anasarca Active Problems:   History of VT (ventricular tachycardia) (HCC)   Seizure disorder (HCC)   Sepsis without acute organ dysfunction (HCC)   Dementia (HCC)   Acute respiratory failure with hypoxemia (HCC)   Failure to thrive in adult   Acute on chronic respiratory failure with hypoxia and  hypercapnia (HCC)   Pancytopenia (HCC)   Aspiration pneumonitis (HCC)   Pneumonia of both lower lobes due to infectious organism    Recommendations/Plan: Plan for liberalization from the ventilator tomorrow - time TBD PMT will be available to support as needed   Code Status: DNR  Prognosis:  Likely minutes to hours after extubation   Thank you for allowing the Palliative Medicine Team to assist in the care of this patient.   MDM - low   Merry Proud, NP   Please contact Palliative Medicine Team phone at [phone] for questions and concerns.  For individual providers, please see AMION.

## 2023-07-07 NOTE — Progress Notes (Addendum)
eLink Physician-Brief Progress Note Patient Name: [name] DOB: 1956/01/25 MRN: [id number]   Date of Service  07/07/2023  HPI/Events of Note  67 year old female with advanced dementia here with acute hypoxic/hypercapnic respiratory failure in the setting of aspiration pneumonia   Was in shock requiring norepinephrine this morning, currently on midodrine 20mg  3 times daily.  Planning on transitioning to comfort measures tomorrow. "They would like to keep her DNR in the meantime and continue care "  eICU Interventions  Resume Norepinephrine.    0345 - K 2.5; add kcl 60 x2 through OG tube   Intervention Category Intermediate Interventions: Hypotension - evaluation and management  Chet Greenley 07/07/2023, 7:53 PM

## 2023-07-07 NOTE — Progress Notes (Signed)
NAME:  [name], MRN:  [id number], DOB:  03/18/1956, LOS: 8 ADMISSION DATE:  07/23/2023, CONSULTATION DATE:  06/30/23 REFERRING MD:  Odie Sera, MD CHIEF COMPLAINT:  Aspiration   History of Present Illness:  67 yo female with frontotemporal dementia and VT brought to ER with progressive dysphagia and respiratory distress.  She has progressive decline in cognitive function since having a UTI in January 2024.  She was started on lasix for edema, but developed hypotension.  She developed respiratory distress after aspirating on medication and transferred to ICU.   Pertinent  Medical History  Dementia, Epilepsy, Ventricular tachycardia  Significant Hospital Events: Including procedures, antibiotic start and stop dates in addition to other pertinent events   7/2 admitted to hospital 7/3 PCCM consult - hypoxia, aspiration. Remains on pressors  7/4  elevated valproic acid level, switched to vimpat  7/5 code status reversed, VT/SVT arrest treated with adenosine, intubated 7/6 started on pressors 07/07/2023: Weaned off pressors  Interim History / Subjective:   Overnight, patient required PRVC mode again.  Patient has been weaned off pressors.  Patient evaluated bedside this morning.  Not conversing.  Patient is still off sedation.  Not able to follow instructions.  Does open eyes to voice at times.  Objective   Blood pressure (!) 102/55, pulse 72, temperature 97.7 F (36.5 C), temperature source Axillary, resp. rate 18, height 5\' 6"  (1.676 m), weight 86.2 kg, SpO2 99 %.      Vent Mode: PRVC FiO2 (%):  [40 %] 40 % Set Rate:  [18 bmp] 18 bmp Vt Set:  [470 mL] 470 mL PEEP:  [5 cmH20] 5 cmH20 Pressure Support:  [10 cmH20] 10 cmH20 Plateau Pressure:  [16 cmH20-17 cmH20] 16 cmH20   Intake/Output Summary (Last 24 hours) at 07/07/2023 1101 Last data filed at 07/07/2023 1000 Gross per 24 hour  Intake 2441.41 ml  Output 1935 ml  Net 506.41 ml   Filed Weights   07/04/23 0421 07/05/23 0348  07/06/23 0430  Weight: 85.4 kg 85.9 kg 86.2 kg    Examination:  General -occasionally opens eyes to voice, unable to follow instructions Eyes -not tracking appropriately ENT - ETT in place Cardiac -regular rate and rhythm Chest bilateral ventral breath sounds, decreased lungs Abdomen -soft nontender, nondistended, normoactive bowel sounds Extremities -2+ pedal pulses, 1+ pitting edema bilaterally Skin - no rashes Neuro -occasionally opens eyes to voice, not following any instructions  Labs: BMP: Sodium 135, potassium 4.0, creatinine 0.68 CBC: White blood cell count 5.0, hemoglobin 8.8, platelet 130 Glucose 122-198  Respiratory culture growing Pseudomonas.  1.8 L output  Resolved Hospital Problem list     Assessment & Plan:   Acute hypoxic/hypercapnic respiratory from aspiration pneumonitis. Today will be day 2 of Zosyn.  Patient still requiring ventilator.  Did calculate RSBI around 40-50 initially.  Concern is, later on, patient became apneic on pressure support mode.  Unable to wean ventilator at this time.  Patient is going Pseudomonas in respiratory culture.  Will continue with Zosyn at this time.  -Zosyn day 2 -VAP bundle -Follow respiratory culture and sensitivities -Monitor white count and fever curve -Continue to wean vent -Family meeting today  Acute metabolic encephalopathy from hypoxia/hypercapnia. 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 Seizure disorder with valproate toxicity. Patient evaluated bedside today.  Patient is off all sedation, and still not having any purposeful movement.  Patient at times is initiating her own breaths, and tolerating pressure support.  Later on today, patient did not  tolerate pressure support and became apneic and required PRVC mode to turn back on.  Concerned that patient is at new baseline with addressing all other reversible causes. -Family meeting today -Continue Onfi 10 mg daily, and 15 mg  nightly -Continue Vimpat 100 mg every 12 hours -Mag goal greater than 2  Ventricular tachycardia. No longer having concern for V. tach, but does have PVCs. -Continue Rythmol  Hypotension Did start steroids yesterday, patient is longer on pressors at this point.  Will continue with steroids, and slowly take off. -Echo did not show any cardiogenic etiology -Monitor blood pressure closely -Continue steroids  Anemia of critical illness. Thrombocytopenia 2nd to sepsis. Leukopenia Hemoglobin stable.  Platelet count stable.  White count stable. -Continue to monitor CBC  Moderate protein calorie malnutrition. Dysphagia. Dysphagia likely secondary to underlying dementia -Continue tube feeds -Family meeting today  Hypothyroidism. - continue synthroid  Hyperglycemia. Glucose continues to be elevated -Will continue to titrate sliding scale  Goals of care. -Palliative care following -Family meeting today  Best Practice (right click and "Reselect all SmartList Selections" daily)  Nutrition: tube feeds DVT: SCD SUP: protonix Lines: N/A Foley: N/A Code status: full code Updated her husband at bedside on 7/07  Labs       Latest Ref Rng & Units 07/07/2023    1:45 AM 07/06/2023    4:29 AM 07/05/2023    5:31 AM  CMP  Glucose 70 - 99 mg/dL 161  096    BUN 8 - 23 mg/dL 15  8    Creatinine 0.45 - 1.00 mg/dL 4.09  8.11    Sodium 914 - 145 mmol/L 135  133  135   Potassium 3.5 - 5.1 mmol/L 4.0  4.1  4.0   Chloride 98 - 111 mmol/L 99  102    CO2 22 - 32 mmol/L 25  25    Calcium 8.9 - 10.3 mg/dL 8.6  8.4        Latest Ref Rng & Units 07/07/2023    1:45 AM 07/06/2023    4:29 AM 07/05/2023    1:28 PM  CBC  WBC 4.0 - 10.5 K/uL 5.0  4.6  3.8   Hemoglobin 12.0 - 15.0 g/dL 8.8  8.4  8.7   Hematocrit 36.0 - 46.0 % 26.5  24.9  25.7   Platelets 150 - 400 K/uL 130  105  PLATELET CLUMPS NOTED ON SMEAR, UNABLE TO ESTIMATE     ABG    Component Value Date/Time   PHART 7.416 07/05/2023 0531    PCO2ART 38.3 07/05/2023 0531   PO2ART 102 07/05/2023 0531   HCO3 24.8 07/05/2023 0531   TCO2 26 07/05/2023 0531   O2SAT 98 07/05/2023 0531    CBG (last 3)  Recent Labs    07/06/23 2325 07/07/23 0327 07/07/23 0802  GLUCAP 188* 166* 137*   Critical care time: 34 minutes  Modena Slater, DO Internal Medicine Resident PGY-2 [phone] 07/07/2023, 11:01 AM

## 2023-07-07 NOTE — IPAL (Signed)
  Interdisciplinary Goals of Care Family Meeting   Date carried out: 07/07/2023  Location of the meeting: Bedside  Member's involved: Physician, Bedside Registered Nurse, and Family Member or next of kin  Durable Power of Attorney or acting medical decision maker: Sanjuana Mae    Discussion: We discussed goals of care for [employer] .   Patient condition was updated to her whole family including patient's husband, son, daughter and son-in-law at bedside in detail. I explained that patient is not initiating breaths on the ventilator, she is generalized weak and tired with advanced dementia. I updated family that as long as she remains on mechanical ventilator she will continue to get weaker unfortunately not a stronger.  Patient wishes very explored, patient clearly said that she would not want tracheostomy in situation like this.  I explained options are perform tracheostomy, PEG tube placement and she will stay in nursing home or we keep her comfortable and proceed with palliative extubation after discussion amongst family member they decided not to proceed with tracheostomy or PEG tube and per patient's wishes they would like to proceed with palliative extubation sometimes tomorrow when all family members comes meet and say goodbye.  They would like to keep her DNR in the meantime and continue care  Code status:   Code Status: DNR   Disposition: Continue current acute care (comfort care and palliative extubation tomorrow)    Cheri Fowler, MD [facility] See Amion for pager If no response to pager, please call [phone] until 7pm After 7pm, Please call E-link [phone]

## 2023-07-08 DIAGNOSIS — J9621 Acute and chronic respiratory failure with hypoxia: Secondary | ICD-10-CM | POA: Diagnosis not present

## 2023-07-08 DIAGNOSIS — J9601 Acute respiratory failure with hypoxia: Secondary | ICD-10-CM | POA: Diagnosis not present

## 2023-07-08 DIAGNOSIS — J9622 Acute and chronic respiratory failure with hypercapnia: Secondary | ICD-10-CM | POA: Diagnosis not present

## 2023-07-08 DIAGNOSIS — R627 Adult failure to thrive: Secondary | ICD-10-CM | POA: Diagnosis not present

## 2023-07-08 DIAGNOSIS — R601 Generalized edema: Secondary | ICD-10-CM | POA: Diagnosis not present

## 2023-07-08 DIAGNOSIS — Z515 Encounter for palliative care: Secondary | ICD-10-CM | POA: Diagnosis not present

## 2023-07-08 DIAGNOSIS — G3109 Other frontotemporal dementia: Secondary | ICD-10-CM | POA: Diagnosis not present

## 2023-07-08 LAB — BASIC METABOLIC PANEL
Anion gap: 8 (ref 5–15)
BUN: 27 mg/dL — ABNORMAL HIGH (ref 8–23)
CO2: 26 mmol/L (ref 22–32)
Calcium: 8.1 mg/dL — ABNORMAL LOW (ref 8.9–10.3)
Chloride: 101 mmol/L (ref 98–111)
Creatinine, Ser: 0.59 mg/dL (ref 0.44–1.00)
GFR, Estimated: >60 mL/min (ref >60)
Glucose, Bld: 106 mg/dL — ABNORMAL HIGH (ref 70–99)
Potassium: 2.5 mmol/L — CL (ref 3.5–5.1)
Sodium: 135 mmol/L (ref 135–145)

## 2023-07-08 LAB — CBC
HCT: 23.5 % — ABNORMAL LOW (ref 36.0–46.0)
Hemoglobin: 8 g/dL — ABNORMAL LOW (ref 12.0–15.0)
MCH: 33.3 pg (ref 26.0–34.0)
MCHC: 34 g/dL (ref 30.0–36.0)
MCV: 97.9 fL (ref 80.0–100.0)
Platelets: 217 10*3/uL (ref 150–400)
RBC: 2.4 MIL/uL — ABNORMAL LOW (ref 3.87–5.11)
RDW: 17.7 % — ABNORMAL HIGH (ref 11.5–15.5)
WBC: 6.8 10*3/uL (ref 4.0–10.5)
nRBC: 0 % (ref 0.0–0.2)

## 2023-07-08 LAB — GLUCOSE, CAPILLARY
Glucose-Capillary: 105 mg/dL — ABNORMAL HIGH (ref 70–99)
Glucose-Capillary: 119 mg/dL — ABNORMAL HIGH (ref 70–99)
Glucose-Capillary: 138 mg/dL — ABNORMAL HIGH (ref 70–99)
Glucose-Capillary: 94 mg/dL (ref 70–99)

## 2023-07-08 MED ORDER — GLYCOPYRROLATE 1 MG PO TABS: 1.0000 mg | ORAL_TABLET | ORAL | Status: DC | PRN

## 2023-07-08 MED ORDER — FENTANYL CITRATE PF 50 MCG/ML IJ SOSY
12.5000 ug | PREFILLED_SYRINGE | INTRAMUSCULAR | Status: DC | PRN
  Administered 2023-07-08: 12.5 ug via INTRAVENOUS
  Filled 2023-07-08: qty 1

## 2023-07-08 MED ORDER — POLYVINYL ALCOHOL 1.4 % OP SOLN: 1.0000 [drp] | Freq: Four times a day (QID) | OPHTHALMIC | Status: DC | PRN

## 2023-07-08 MED ORDER — MORPHINE 100MG IN NS 100ML (1MG/ML) PREMIX INFUSION
0.0000 mg/h | INTRAVENOUS | Status: DC
  Administered 2023-07-08: 5 mg/h via INTRAVENOUS
  Filled 2023-07-08: qty 100

## 2023-07-08 MED ORDER — GLYCOPYRROLATE 0.2 MG/ML IJ SOLN: 0.2000 mg | INTRAMUSCULAR | Status: DC | PRN

## 2023-07-08 MED ORDER — MORPHINE BOLUS VIA INFUSION: 5.0000 mg | INTRAVENOUS | Status: DC | PRN

## 2023-07-08 MED ORDER — ACETAMINOPHEN 650 MG RE SUPP: 650.0000 mg | Freq: Four times a day (QID) | RECTAL | Status: DC | PRN

## 2023-07-08 MED ORDER — POTASSIUM CHLORIDE 20 MEQ PO PACK
60.0000 meq | PACK | Freq: Three times a day (TID) | ORAL | Status: AC
  Administered 2023-07-08 (×2): 60 meq
  Filled 2023-07-08 (×2): qty 3

## 2023-07-08 MED ORDER — GLYCOPYRROLATE 0.2 MG/ML IJ SOLN
0.2000 mg | INTRAMUSCULAR | Status: DC | PRN
  Administered 2023-07-08: 0.2 mg via INTRAVENOUS
  Filled 2023-07-08: qty 1

## 2023-07-08 MED ORDER — FENTANYL BOLUS VIA INFUSION
25.0000 ug | INTRAVENOUS | Status: DC | PRN
  Administered 2023-07-08: 25 ug via INTRAVENOUS

## 2023-07-08 MED ORDER — SODIUM CHLORIDE 0.9 % IV SOLN: INTRAVENOUS | Status: DC

## 2023-07-08 MED ORDER — FENTANYL 2500MCG IN NS 250ML (10MCG/ML) PREMIX INFUSION
25.0000 ug/h | INTRAVENOUS | Status: DC
  Administered 2023-07-08: 50 ug/h via INTRAVENOUS
  Filled 2023-07-08: qty 250

## 2023-07-08 MED ORDER — ACETAMINOPHEN 325 MG PO TABS: 650.0000 mg | ORAL_TABLET | Freq: Four times a day (QID) | ORAL | Status: DC | PRN

## 2023-07-08 MED ORDER — MIDAZOLAM HCL 2 MG/2ML IJ SOLN: 2.0000 mg | INTRAMUSCULAR | Status: DC | PRN

## 2023-07-09 LAB — CULTURE, RESPIRATORY W GRAM STAIN

## 2023-07-10 LAB — CULTURE, RESPIRATORY W GRAM STAIN

## 2023-07-29 NOTE — Progress Notes (Signed)
Palliative Medicine Progress Note   Patient Name: [name]       Date: 06/28/2023 DOB: 1956-07-15  Age: 67 y.o. MRN#: [id number] Attending Physician: Cheri Fowler, MD Primary Care Physician: Jackelyn Poling, DO Admit Date: 2023/07/27   HPI/Patient Profile: 67 y.o. female with past medical history of frontotemporal dementia, progressive supranuclear palsy, seizures, and TBI admitted on 27-Jul-2023 with hypoxia and anasarca. Patient also with aspiration pneumonia and bilateral pleural effusions. She is being treated with antibiotics and supplemental oxygen. Unable to proceed with thoracentesis. Patient with ongoing dysphagia. PMT consulted to discuss goals of care.    Code status changed to DNR/DNI in family meeting 7/5. Patient acutely decompensated later that evening and DNR was reversed. Patient had VT arrest and was intubated.    Subjective: Chart reviewed. Patient was compassionately extubated to comfort measures at 12:40.   Bedside visit. Patient appears comfortable. She is unresponsive. No non-verbal signs of pain or discomfort noted. Respirations are even and unlabored. No excessive respiratory secretions noted. Family present at bedside. Discussed natural trajectory at EOL. Emotional support provided.      Objective:  Physical Exam Vitals reviewed.  Constitutional:      General: She is not in acute distress.    Appearance: She is ill-appearing.  Pulmonary:     Effort: No respiratory distress.  Neurological:     Mental Status: She is unresponsive.             Vital Signs: BP 119/66   Pulse 62   Temp 98 F (36.7 C) (Axillary)   Resp 14   Ht 5\' 6"  (1.676 m)   Wt 89.5 kg   SpO2 (!) 30%   BMI 31.85 kg/m  SpO2: SpO2: (!) 30 % O2 Device: O2 Device: Room Air   Palliative  Medicine Assessment & Plan   Assessment: Principal Problem:   Anasarca Active Problems:   History of VT (ventricular tachycardia) (HCC)   Seizure disorder (HCC)   Sepsis without acute organ dysfunction (HCC)   Dementia (HCC)   Acute respiratory failure with hypoxemia (HCC)   Failure to thrive in adult   Acute on chronic respiratory failure with hypoxia and hypercapnia (HCC)   Pancytopenia (HCC)   Aspiration pneumonitis (HCC)   Pneumonia of both lower lobes due to infectious organism    Recommendations/Plan: Continue  comfort measures Continue morphine infusion PMT will continue to support as needed  Code Status: DNR   Prognosis:  Hours - Days  Discharge Planning: Anticipated Hospital Death    Thank you for allowing the Palliative Medicine Team to assist in the care of this patient.   MDM - moderate   Merry Proud, NP   Please contact Palliative Medicine Team phone at [phone] for questions and concerns.  For individual providers, please see AMION.

## 2023-07-29 NOTE — Death Summary Note (Signed)
DEATH SUMMARY   Patient Details  Name: [name] MRN: [id number] DOB: 08/05/1956  Admission/Discharge Information   Admit Date:  07/24/2023  Date of Death: Date of Death: 07/17/23  Time of Death: Time of Death: 1630  Length of Stay: Jul 15, 2023  Referring Physician: Jackelyn Poling, DO   Reason(s) for Hospitalization  Acute hypoxic/hypercapnic respiratory Sepsis with septic shock Pseudomonas pneumonia, POA Acute metabolic encephalopathy from hypoxia/hypercapnia. 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 Seizure disorder with valproate toxicity. Ventricular tachycardia. Anemia of critical illness. Thrombocytopenia 2nd to sepsis. Leukopenia Moderate protein calorie malnutrition. Dysphagia. Hypothyroidism. Hypokalemia/hypomagnesemia/hypophosphatemia  Diagnoses  Preliminary cause of death: Withdrawal of care with comfort care in the setting of multisystem organ failure Secondary Diagnoses (including complications and co-morbidities):  Principal Problem:   Anasarca Active Problems:   History of VT (ventricular tachycardia) (HCC)   Seizure disorder (HCC)   Sepsis without acute organ dysfunction (HCC)   Dementia (HCC)   Acute respiratory failure with hypoxemia (HCC)   Failure to thrive in adult   Acute on chronic respiratory failure with hypoxia and hypercapnia (HCC)   Pancytopenia (HCC)   Aspiration pneumonitis (HCC)   Pneumonia of both lower lobes due to infectious organism   Brief Hospital Course (including significant findings, care, treatment, and services provided and events leading to death)  [name] is a 67 y.o. year old female with frontotemporal dementia and VT brought to ER with progressive dysphagia and respiratory distress. She has progressive decline in cognitive function since having a UTI in January 2024. She was started on lasix for edema, but developed hypotension. She developed respiratory distress after aspirating on medication and  transferred to ICU  7/2 admitted to hospital 7/3 PCCM consult - hypoxia, aspiration. Remains on pressors  7/4  elevated valproic acid level, switched to vimpat  7/5 code status reversed, VT/SVT arrest treated with adenosine, intubated 7/6 started on pressors 7/7 - 7/10: Patient remains on vasopressor support, initially she was tolerating spontaneous breathing trial but mental status precludes extubation, then she became weak, was not tolerating spinal breathing trial due to apnea.  She was started on stress dose steroid after that she came off of vasopressors as she failed spontaneous breathing trial multiple times, goals of care discussions were carried with family, they decided patient would not want tracheostomy and would like to be comfortable.  Comfort care orders were written, patient was palliatively extubated on 07-17-23, she passed on 2023/07/17 at 1630 p.m.  Patient's family was at bedside    Pertinent Labs and Studies  Significant Diagnostic Studies DG CHEST PORT 1 VIEW  Result Date: 07/07/2023 CLINICAL DATA:  Shortness of breath EXAM: PORTABLE CHEST 1 VIEW COMPARISON:  07/04/2023 FINDINGS: Cardiac shadow is stable. Feeding catheter extends into the stomach. Endotracheal tube is noted in satisfactory position. Left-sided effusion and basilar consolidation are again seen. No new focal abnormality is noted. IMPRESSION: Left effusion and basilar consolidation stable from the prior exam. Improved aeration in the right base is noted. Electronically Signed   By: Alcide Clever M.D.   On: 07/07/2023 11:39   ECHOCARDIOGRAM COMPLETE  Result Date: 07-15-2023    ECHOCARDIOGRAM REPORT   Patient Name:   [name] Date of Exam: 15-Jul-2023 Medical Rec #:  [id number]     Height:       66.0 in Accession #:    [id number]    Weight:       190.0 lb Date of Birth:  September 06, 1956     BSA:  1.957 m Patient Age:    54 years      BP:           100/58 mmHg Patient Gender: F             HR:           96 bpm.  Exam Location:  Inpatient Procedure: 2D Echo, Cardiac Doppler and Color Doppler Indications:    Shock  History:        Patient has prior history of Echocardiogram examinations, most                 recent 02/03/2023. Arrythmias:Tachycardia.  Sonographer:    Darlys Gales Referring Phys: [id number] Destry Dauber IMPRESSIONS  1. Left ventricular ejection fraction, by estimation, is 60 to 65%. The left ventricle has normal function. The left ventricle has no regional wall motion abnormalities. Left ventricular diastolic parameters were normal.  2. Right ventricular systolic function is normal. The right ventricular size is normal. Tricuspid regurgitation signal is inadequate for assessing PA pressure.  3. The mitral valve is normal in structure. No evidence of mitral valve regurgitation. No evidence of mitral stenosis.  4. The aortic valve is normal in structure. Aortic valve regurgitation is not visualized. No aortic stenosis is present.  5. The inferior vena cava is normal in size with <50% respiratory variability, suggesting right atrial pressure of 8 mmHg. Comparison(s): No significant change from prior study. Prior images reviewed side by side. Pleural effusion is new. FINDINGS  Left Ventricle: Left ventricular ejection fraction, by estimation, is 60 to 65%. The left ventricle has normal function. The left ventricle has no regional wall motion abnormalities. The left ventricular internal cavity size was normal in size. There is  no left ventricular hypertrophy. Left ventricular diastolic parameters were normal. Right Ventricle: The right ventricular size is normal. No increase in right ventricular wall thickness. Right ventricular systolic function is normal. Tricuspid regurgitation signal is inadequate for assessing PA pressure. Left Atrium: Left atrial size was normal in size. Right Atrium: Right atrial size was normal in size. Pericardium: There is no evidence of pericardial effusion. Mitral Valve: The mitral valve is  normal in structure. No evidence of mitral valve regurgitation. No evidence of mitral valve stenosis. Tricuspid Valve: The tricuspid valve is normal in structure. Tricuspid valve regurgitation is not demonstrated. No evidence of tricuspid stenosis. Aortic Valve: The aortic valve is normal in structure. Aortic valve regurgitation is not visualized. No aortic stenosis is present. Aortic valve mean gradient measures 5.0 mmHg. Aortic valve peak gradient measures 9.1 mmHg. Aortic valve area, by VTI measures 1.83 cm. Pulmonic Valve: The pulmonic valve was grossly normal. Pulmonic valve regurgitation is trivial. No evidence of pulmonic stenosis. Aorta: The aortic root is normal in size and structure. Venous: The inferior vena cava is normal in size with less than 50% respiratory variability, suggesting right atrial pressure of 8 mmHg. IAS/Shunts: No atrial level shunt detected by color flow Doppler. Additional Comments: There is pleural effusion in the left lateral region.  LEFT VENTRICLE PLAX 2D LVIDd:         4.70 cm   Diastology LVIDs:         3.30 cm   LV e' medial:    9.14 cm/s LV PW:         1.20 cm   LV E/e' medial:  8.5 LV IVS:        0.90 cm   LV e' lateral:   12.20 cm/s LVOT diam:  1.80 cm   LV E/e' lateral: 6.4 LV SV:         48 LV SV Index:   24 LVOT Area:     2.54 cm  RIGHT VENTRICLE RV S prime:     12.60 cm/s TAPSE (M-mode): 2.1 cm LEFT ATRIUM             Index        RIGHT ATRIUM           Index LA Vol (A2C):   50.8 ml 25.96 ml/m  RA Area:     12.30 cm LA Vol (A4C):   48.6 ml 24.84 ml/m  RA Volume:   27.70 ml  14.16 ml/m LA Biplane Vol: 50.1 ml 25.60 ml/m  AORTIC VALVE AV Area (Vmax):    1.85 cm AV Area (Vmean):   1.78 cm AV Area (VTI):     1.83 cm AV Vmax:           151.00 cm/s AV Vmean:          102.000 cm/s AV VTI:            0.261 m AV Peak Grad:      9.1 mmHg AV Mean Grad:      5.0 mmHg LVOT Vmax:         110.00 cm/s LVOT Vmean:        71.400 cm/s LVOT VTI:          0.188 m LVOT/AV VTI  ratio: 0.72  AORTA Ao Root diam: 3.00 cm MITRAL VALVE MV Area (PHT): 3.77 cm    SHUNTS MV Decel Time: 201 msec    Systemic VTI:  0.19 m MV E velocity: 77.60 cm/s  Systemic Diam: 1.80 cm MV A velocity: 32.50 cm/s MV E/A ratio:  2.39 Mihai Croitoru MD Electronically signed by Thurmon Fair MD Signature Date/Time: 07/06/2023/10:51:23 AM    Final    DG Chest Port 1 View  Result Date: 07/04/2023 CLINICAL DATA:  Altered mental status. EXAM: PORTABLE CHEST 1 VIEW COMPARISON:  07/02/2023 FINDINGS: Endotracheal tube tip is 2.5 cm above the base of the carina. A feeding tube passes into the stomach although the distal tip position is not included on the film. Bibasilar collapse/consolidation, left greater than right, similar to prior. Small left pleural effusion noted. Bones are diffusely demineralized. Telemetry leads overlie the chest. IMPRESSION: 1. Endotracheal tube tip is 2.5 cm above the base of the carina. 2. Bibasilar collapse/consolidation, left greater than right, similar to prior. Electronically Signed   By: Kennith Center M.D.   On: 07/04/2023 10:25   DG CHEST PORT 1 VIEW  Result Date: 07/02/2023 CLINICAL DATA:  Intubation EXAM: PORTABLE CHEST 1 VIEW COMPARISON:  07/02/2023 FINDINGS: NG tube scar 7th endotracheal tube tip is 3 cm above the carina. Feeding tube tip is in the distal stomach. Moderate to large layering bilateral pleural effusions with bibasilar opacities, likely atelectasis. Heart is normal size. Mediastinal contours are within normal limits. No acute bony abnormality. IMPRESSION: Moderate to large layering bilateral effusions with bibasilar atelectasis. Electronically Signed   By: Charlett Nose M.D.   On: 07/02/2023 21:22   Portable Chest x-ray  Result Date: 07/02/2023 CLINICAL DATA:  Check endotracheal tube placement EXAM: PORTABLE CHEST 1 VIEW COMPARISON:  07/01/2023 FINDINGS: Feeding catheter is noted extending into the stomach. Endotracheal tube is noted extending into the right mainstem  bronchus. This should be withdrawn approximately 3 cm. Worsening consolidation left retrocardiac region is noted. No bony abnormality  is seen. IMPRESSION: Endotracheal tube extending into the right mainstem bronchus. This should be withdrawn approximately 3 cm. Increasing left retrocardiac consolidation. These results will be called to the ordering clinician or representative by the Radiologist Assistant, and communication documented in the PACS or [employer]. Electronically Signed   By: Alcide Clever M.D.   On: 07/02/2023 19:29   DG Abd Portable 1V  Result Date: 07/02/2023 CLINICAL DATA:  Orogastric tube placement. EXAM: PORTABLE ABDOMEN - 1 VIEW COMPARISON:  None Available. FINDINGS: Tip of the weighted enteric tube in the right upper quadrant in the region of the distal stomach or proximal duodenum. Nonobstructed upper abdominal bowel gas pattern IMPRESSION: Tip of the weighted enteric tube in the right upper quadrant in the region of the distal stomach or proximal duodenum. Electronically Signed   By: Narda Rutherford M.D.   On: 07/02/2023 14:45   DG Chest Port 1 View  Result Date: 07/01/2023 CLINICAL DATA:  Acute respiratory difficulty EXAM: PORTABLE CHEST 1 VIEW COMPARISON:  06/30/2023 FINDINGS: Cardiac shadow is stable. Increasing vascular congestion is noted as well as increasing effusions bilaterally. Underlying basilar atelectasis is present. IMPRESSION: Increasing effusions when compare with the previous day. Increasing vascular congestion is noted as well. Electronically Signed   By: Alcide Clever M.D.   On: 07/01/2023 23:00   VAS [place] LOWER EXTREMITY VENOUS (DVT)  Result Date: 06/30/2023  Lower Venous DVT Study Patient Name:  DALANEY NEEDLE  Date of Exam:   06/30/2023 Medical Rec #: [id number]      Accession #:    [id number] Date of Birth: 1956/05/16      Patient Gender: F Patient Age:   57 years Exam Location:  [facility] Procedure:      VAS [place] LOWER EXTREMITY VENOUS (DVT) Referring  Phys: [employer] GOEL --------------------------------------------------------------------------------  Indications: Edema.  Limitations: Poor ultrasound/tissue interface. Patient immobility. Comparison Study: 01-17-2023 Prior bilateral lower extremity venous study was                   negative for DVT. Performing Technologist: Jean Rosenthal RDMS, RVT  Examination Guidelines: A complete evaluation includes B-mode imaging, spectral Doppler, color Doppler, and power Doppler as needed of all accessible portions of each vessel. Bilateral testing is considered an integral part of a complete examination. Limited examinations for reoccurring indications may be performed as noted. The reflux portion of the exam is performed with the patient in reverse Trendelenburg.  +---------+---------------+---------+-----------+----------+-------------------+ RIGHT    CompressibilityPhasicitySpontaneityPropertiesThrombus Aging      +---------+---------------+---------+-----------+----------+-------------------+ CFV      Full           Yes      Yes                                      +---------+---------------+---------+-----------+----------+-------------------+ SFJ      Full                                                             +---------+---------------+---------+-----------+----------+-------------------+ FV Prox  Full                                                             +---------+---------------+---------+-----------+----------+-------------------+  FV Mid   Full                                                             +---------+---------------+---------+-----------+----------+-------------------+ FV DistalFull                                                             +---------+---------------+---------+-----------+----------+-------------------+ PFV      Full                                                              +---------+---------------+---------+-----------+----------+-------------------+ POP      Full           Yes      Yes                                      +---------+---------------+---------+-----------+----------+-------------------+ PTV      Full                                                             +---------+---------------+---------+-----------+----------+-------------------+ PERO                                                  Not well visualized +---------+---------------+---------+-----------+----------+-------------------+   +---------+---------------+---------+-----------+----------+-------------------+ LEFT     CompressibilityPhasicitySpontaneityPropertiesThrombus Aging      +---------+---------------+---------+-----------+----------+-------------------+ CFV      Full           Yes      Yes                                      +---------+---------------+---------+-----------+----------+-------------------+ SFJ      Full                                                             +---------+---------------+---------+-----------+----------+-------------------+ FV Prox  Full                                                             +---------+---------------+---------+-----------+----------+-------------------+ FV Mid   Full                                                             +---------+---------------+---------+-----------+----------+-------------------+  FV DistalFull                                                             +---------+---------------+---------+-----------+----------+-------------------+ PFV      Full                                                             +---------+---------------+---------+-----------+----------+-------------------+ POP                     Yes      Yes                  Patent by color     +---------+---------------+---------+-----------+----------+-------------------+  PTV      Full                                                             +---------+---------------+---------+-----------+----------+-------------------+ PERO                                                  Not well visualized +---------+---------------+---------+-----------+----------+-------------------+     Summary: RIGHT: - There is no evidence of deep vein thrombosis in the lower extremity. However, portions of this examination were limited- see technologist comments above.  - No cystic structure found in the popliteal fossa.  LEFT: - There is no evidence of deep vein thrombosis in the lower extremity. However, portions of this examination were limited- see technologist comments above.  - No cystic structure found in the popliteal fossa.  *See table(s) above for measurements and observations. Electronically signed by Coral Else MD on 06/30/2023 at 9:18:28 PM.    Final    DG Chest Port 1 View  Result Date: 06/30/2023 CLINICAL DATA:  Acute respiratory distress EXAM: PORTABLE CHEST 1 VIEW COMPARISON:  07/04/2023 FINDINGS: Cardiac shadow is stable. Bilateral pleural effusions are noted left greater than right similar to that seen prior exam. No new focal abnormality is noted. IMPRESSION: Stable bilateral effusions when compared with the previous day. Electronically Signed   By: Alcide Clever M.D.   On: 06/30/2023 03:30   CT Angio Chest PE W/Cm &/Or Wo Cm  Result Date: 07/07/2023 CLINICAL DATA:  Concern for pulmonary embolism. EXAM: CT ANGIOGRAPHY CHEST WITH CONTRAST TECHNIQUE: Multidetector CT imaging of the chest was performed using the standard protocol during bolus administration of intravenous contrast. Multiplanar CT image reconstructions and MIPs were obtained to evaluate the vascular anatomy. RADIATION DOSE REDUCTION: This exam was performed according to the departmental dose-optimization program which includes automated exposure control, adjustment of the mA and/or kV according to  patient size and/or use of iterative reconstruction technique. CONTRAST:  75mL OMNIPAQUE IOHEXOL 350 MG/ML SOLN COMPARISON:  Chest CT dated 03/12/2023. FINDINGS: Evaluation is limited due to respiratory motion and streak  artifact caused by patient's arms. Cardiovascular: There is no cardiomegaly or pericardial effusion. Mild atherosclerotic calcification of the thoracic aorta. No aneurysmal dilatation. Evaluation of the pulmonary arteries is limited due to factors above. No pulmonary artery embolus identified. Mediastinum/Nodes: Subcarinal lymph node measures 16 mm short axis. No obvious hilar adenopathy. Evaluation of the hilar lymph nodes however is limited due to consolidative changes of the lungs. The esophagus is grossly unremarkable. No mediastinal fluid collection. Lungs/Pleura: Small bilateral pleural effusions, significantly increased since the prior CT. There is complete consolidation of the left lower lobe with partial consolidation of the right lower lobe which may represent atelectasis or pneumonia. Underlying mass is not excluded clinical correlation and follow-up to resolution recommended. There is mosaic attenuation of the aerated lung which may represent air trapping and related to underlying small airways versus small vessel disease. There is no pneumothorax. High-grade narrowing of the bilateral lower lobe bronchi, possibly related to mucous impaction. Aspiration is not excluded. Upper Abdomen: No acute abnormality. Musculoskeletal: Osteopenia with degenerative changes of the spine. Several old thoracic spine compression fractures. No acute osseous pathology. Review of the MIP images confirms the above findings. IMPRESSION: 1. No CT evidence of pulmonary embolism. 2. Small bilateral pleural effusions, significantly increased since the prior CT. 3. Complete consolidation of the left lower lobe with partial consolidation of the right lower lobe which may represent atelectasis or pneumonia.  Underlying mass is not excluded clinical correlation and follow-up to resolution recommended. 4.  Aortic Atherosclerosis (ICD10-I70.0). Electronically Signed   By: Elgie Collard M.D.   On: 07/19/2023 19:46   CT Head Wo Contrast  Result Date: 07/21/2023 CLINICAL DATA:  Mental status change, unknown cause EXAM: CT HEAD WITHOUT CONTRAST TECHNIQUE: Contiguous axial images were obtained from the base of the skull through the vertex without intravenous contrast. RADIATION DOSE REDUCTION: This exam was performed according to the departmental dose-optimization program which includes automated exposure control, adjustment of the mA and/or kV according to patient size and/or use of iterative reconstruction technique. COMPARISON:  None Available. FINDINGS: Brain: There is atrophy and chronic small vessel disease changes. No acute intracranial abnormality. Specifically, no hemorrhage, hydrocephalus, mass lesion, acute infarction, or significant intracranial injury. Vascular: No hyperdense vessel or unexpected calcification. Skull: No acute calvarial abnormality. Sinuses/Orbits: Mucosal thickening within the left maxillary sinus, ethmoid air cells and bilateral frontal sinuses. Air-fluid level in the sphenoid sinuses. Other: None IMPRESSION: Atrophy, chronic microvascular disease. No acute intracranial abnormality. Acute on chronic sinusitis. Electronically Signed   By: Charlett Nose M.D.   On: 07/07/2023 19:41   DG Chest Port 1 View  Result Date: 07/28/2023 CLINICAL DATA:  Questionable sepsis EXAM: PORTABLE CHEST 1 VIEW COMPARISON:  X-ray and CT 03/12/2023 FINDINGS: Increasing bilateral small to moderate pleural effusions with adjacent opacities. No pneumothorax. Film is rotated to the left. Enlarged cardiopericardial silhouette. No edema. Degenerative changes of the spine. IMPRESSION: Developing small to moderate pleural effusions. Adjacent lung opacities. Recommend follow-up Electronically Signed   By: Karen Kays  M.D.   On: 07/22/2023 16:28   VAS [place] UPPER EXTREMITY VENOUS DUPLEX  Result Date: 06/16/2023 UPPER VENOUS STUDY  Patient Name:  MATIKA BARTELL  Date of Exam:   06/16/2023 Medical Rec #: [id number]      Accession #:    [id number] Date of Birth: 1956/12/24      Patient Gender: F Patient Age:   4 years Exam Location:  [facility] Procedure:      VAS [place]  UPPER EXTREMITY VENOUS DUPLEX Referring Phys: Jackelyn Poling --------------------------------------------------------------------------------  Indications: Edema Risk Factors: Immobility Cancer Hx breast Ca Surgery 08/18/22 Left knee total arthroplasty. Limitations: Body habitus and gravity dependent posture, immobility. Performing Technologist: Lowell Guitar RVT, RDMS  Examination Guidelines: A complete evaluation includes B-mode imaging, spectral Doppler, color Doppler, and power Doppler as needed of all accessible portions of each vessel. Bilateral testing is considered an integral part of a complete examination. Limited examinations for reoccurring indications may be performed as noted.  Right Findings: +----------+------------+---------+-----------+----------+-------+ RIGHT     CompressiblePhasicitySpontaneousPropertiesSummary +----------+------------+---------+-----------+----------+-------+ IJV           Full       Yes       Yes                      +----------+------------+---------+-----------+----------+-------+ Subclavian               Yes       Yes                      +----------+------------+---------+-----------+----------+-------+ Axillary      Full       Yes       Yes                      +----------+------------+---------+-----------+----------+-------+ Brachial      Full       Yes       Yes                      +----------+------------+---------+-----------+----------+-------+ Cephalic      Full       Yes       Yes                       +----------+------------+---------+-----------+----------+-------+ Basilic       Full       Yes       Yes                      +----------+------------+---------+-----------+----------+-------+ Right radial and ulnar veins not able to be visualized due to patient immobility.  Left Findings: +----------+------------+---------+-----------+----------+-------+ LEFT      CompressiblePhasicitySpontaneousPropertiesSummary +----------+------------+---------+-----------+----------+-------+ Subclavian               Yes       Yes                      +----------+------------+---------+-----------+----------+-------+  Summary:  Right: No evidence of deep vein thrombosis in the upper extremity. No evidence of superficial vein thrombosis in the upper extremity. However, unable to visualize the Right radial and ulnar veins.  Left: No evidence of thrombosis in the subclavian.  *See table(s) above for measurements and observations.  Diagnosing physician: Waverly Ferrari MD Electronically signed by Waverly Ferrari MD on 06/16/2023 at 8:36:30 AM.    Final     Microbiology Recent Results (from the past 240 hour(s))  Blood Culture (routine x 2)     Status: None   Collection Time: 07/17/2023  3:59 PM   Specimen: BLOOD  Result Value Ref Range Status   Specimen Description BLOOD LEFT ANTECUBITAL  Final   Special Requests   Final    BOTTLES DRAWN AEROBIC AND ANAEROBIC Blood Culture results may not be optimal due to an inadequate volume of blood received in culture bottles   Culture   Final    NO  GROWTH 5 DAYS Performed at [facility], [address] [place], [place] [address]    Report Status 07/04/2023 FINAL  Final  Blood Culture (routine x 2)     Status: None   Collection Time: 06/28/2023  4:37 PM   Specimen: BLOOD  Result Value Ref Range Status   Specimen Description BLOOD RIGHT ANTECUBITAL  Final   Special Requests   Final    BOTTLES DRAWN AEROBIC AND ANAEROBIC Blood Culture results  may not be optimal due to an inadequate volume of blood received in culture bottles   Culture   Final    NO GROWTH 5 DAYS Performed at [facility], [address] [place], [place] [address]    Report Status 07/04/2023 FINAL  Final  MRSA Next Gen by PCR, Nasal     Status: None   Collection Time: 06/30/23  4:30 AM   Specimen: Nasal Mucosa; Nasal Swab  Result Value Ref Range Status   MRSA by PCR Next Gen NOT DETECTED NOT DETECTED Final    Comment: (NOTE) The GeneXpert MRSA Assay (FDA approved for NASAL specimens only), is one component of a comprehensive MRSA colonization surveillance program. It is not intended to diagnose MRSA infection nor to guide or monitor treatment for MRSA infections. Test performance is not FDA approved in patients less than 60 years old. Performed at [facility], [address], [place], [place] [address]   Urine Culture (for pregnant, neutropenic or urologic patients or patients with an indwelling urinary catheter)     Status: None   Collection Time: 07/05/23  1:42 PM   Specimen: Urine, Clean Catch  Result Value Ref Range Status   Specimen Description URINE, CLEAN CATCH  Final   Special Requests NONE  Final   Culture   Final    NO GROWTH Performed at [facility], [address] [place], [place] [address]    Report Status 07/06/2023 FINAL  Final  Culture, Respiratory w Gram Stain     Status: None (Preliminary result)   Collection Time: 07/05/23  2:52 PM   Specimen: Tracheal Aspirate; Respiratory  Result Value Ref Range Status   Specimen Description TRACHEAL ASPIRATE  Final   Special Requests NONE  Final   Gram Stain   Final    FEW WBC SEEN RARE SQUAMOUS EPITHELIAL CELLS PRESENT MODERATE YEAST MODERATE GRAM NEGATIVE RODS    Culture   Final    FEW PSEUDOMONAS AERUGINOSA CULTURE REINCUBATED FOR BETTER GROWTH Performed at [facility] [place] [facility], [address] [place], [place] [address]    Report Status PENDING  Incomplete    Organism ID, Bacteria PSEUDOMONAS AERUGINOSA  Final      Susceptibility   Pseudomonas aeruginosa - MIC*    CEFTAZIDIME 2 SENSITIVE Sensitive     CIPROFLOXACIN <=0.25 SENSITIVE Sensitive     GENTAMICIN <=1 SENSITIVE Sensitive     IMIPENEM 1 SENSITIVE Sensitive     PIP/TAZO <=4 SENSITIVE Sensitive     CEFEPIME 2 SENSITIVE Sensitive     * FEW PSEUDOMONAS AERUGINOSA    Lab Basic Metabolic Panel: Recent Labs  Lab 07/03/23 0601 07/03/23 1030 07/03/23 1732 07/04/23 0154 07/04/23 1719 07/05/23 0123 07/05/23 0531 07/06/23 0429 07/07/23 0145 07/07/2023 0227  NA 134*  --   --  133* 133* 132* 135 133* 135 135  K 3.1*  --   --  2.6* 3.9 4.1 4.0 4.1 4.0 2.5*  CL 91*  --   --  95* 100 101  --  102 99 101  CO2 31  --   --  29 23 24   --  25 25 26   GLUCOSE 152*  --   --  141* 229* 244*  --  244* 189* 106*  BUN 6*  --   --  <5* 5* 5*  --  8 15 27*  CREATININE 0.79  --   --  0.74 0.58 0.67  --  0.60 0.68 0.59  CALCIUM 8.7*  --   --  8.5* 8.5* 8.3*  --  8.4* 8.6* 8.1*  MG  --  2.4 1.9 1.9 1.9  --   --  1.8  --   --   PHOS 1.7*  --  4.1 4.0 3.7  --   --  4.7*  --   --    Liver Function Tests: No results for input(s): "AST", "ALT", "ALKPHOS", "BILITOT", "PROT", "ALBUMIN" in the last 168 hours. No results for input(s): "LIPASE", "AMYLASE" in the last 168 hours. No results for input(s): "AMMONIA" in the last 168 hours. CBC: Recent Labs  Lab 07/05/23 0123 07/05/23 0531 07/05/23 1328 07/06/23 0429 07/07/23 0145 07/19/2023 0227  WBC 3.7*  --  3.8* 4.6 5.0 6.8  NEUTROABS  --   --  2.0  --   --   --   HGB 8.9* 8.5* 8.7* 8.4* 8.8* 8.0*  HCT 26.9* 25.0* 25.7* 24.9* 26.5* 23.5*  MCV 96.8  --  97.7 96.9 96.4 97.9  PLT 85*  --  PLATELET CLUMPS NOTED ON SMEAR, UNABLE TO ESTIMATE 105* 130* 217   Cardiac Enzymes: No results for input(s): "CKTOTAL", "CKMB", "CKMBINDEX", "TROPONINI" in the last 168 hours. Sepsis Labs: Recent Labs  Lab 07/05/23 1328 07/06/23 0429 07/07/23 0145 07/27/2023 0227  WBC  3.8* 4.6 5.0 6.8    Procedures/Operations     Orlondo Holycross 07/23/2023, 5:49 PM

## 2023-07-29 NOTE — Progress Notes (Signed)
NAME:  [name], MRN:  [id number], DOB:  02/09/1956, LOS: 9 ADMISSION DATE:  07/25/2023, CONSULTATION DATE:  06/30/23 REFERRING MD:  Odie Sera, MD CHIEF COMPLAINT:  Aspiration   History of Present Illness:  67 yo female with frontotemporal dementia and VT brought to ER with progressive dysphagia and respiratory distress.  She has progressive decline in cognitive function since having a UTI in January 2024.  She was started on lasix for edema, but developed hypotension.  She developed respiratory distress after aspirating on medication and transferred to ICU.   Pertinent  Medical History  Dementia, Epilepsy, Ventricular tachycardia  Significant Hospital Events: Including procedures, antibiotic start and stop dates in addition to other pertinent events   7/2 admitted to hospital 7/3 PCCM consult - hypoxia, aspiration. Remains on pressors  7/4  elevated valproic acid level, switched to vimpat  7/5 code status reversed, VT/SVT arrest treated with adenosine, intubated 7/6 started on pressors 07/07/2023: Weaned off pressors  Interim History / Subjective:  Had goals of care discussion with patient's family, decision was to proceed comfort care and palliative extubation today Overnight she became hypotensive requiring vasopressor support  Objective   Blood pressure 135/65, pulse 78, temperature 97.8 F (36.6 C), temperature source Axillary, resp. rate 18, height 5\' 6"  (1.676 m), weight 89.5 kg, SpO2 100%.      Vent Mode: PRVC FiO2 (%):  [40 %] 40 % Set Rate:  [18 bmp] 18 bmp Vt Set:  [470 mL] 470 mL PEEP:  [5 cmH20] 5 cmH20 Plateau Pressure:  [14 cmH20-21 cmH20] 21 cmH20   Intake/Output Summary (Last 24 hours) at 07/11/2023 0838 Last data filed at 07/15/2023 0600 Gross per 24 hour  Intake 1480.11 ml  Output 895 ml  Net 585.11 ml   Filed Weights   07/05/23 0348 07/06/23 0430 07/15/2023 0438  Weight: 85.9 kg 86.2 kg 89.5 kg    Examination: Physical exam: General: Crtitically  ill-appearing female, orally intubated HEENT: [place]/AT, eyes anicteric.  ETT and OGT in place Neuro: Eyes closed, does not open, not following commands, generalized weak Chest: Coarse breath sounds, no wheezes or rhonchi Heart: Regular rate and rhythm, no murmurs or gallops Abdomen: Soft, nondistended, bowel sounds present Skin: No rash  Labs and images were reviewed  Resolved Hospital Problem list     Assessment & Plan:  Acute hypoxic/hypercapnic respiratory Sepsis with septic shock Pseudomonas pneumonia, POA Acute metabolic encephalopathy from hypoxia/hypercapnia. 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 Seizure disorder with valproate toxicity. Ventricular tachycardia. Anemia of critical illness. Thrombocytopenia 2nd to sepsis. Leukopenia Moderate protein calorie malnutrition. Dysphagia. Hypothyroidism. Hypokalemia/hypomagnesemia/hypophosphatemia  After goals of care discussion patient is going comfort care today with palliative extubation Comfort care orders are written Patient's family is at bedside  Best Practice (right click and "Reselect all SmartList Selections" daily)  Nutrition: tube feeds DVT: SCD SUP: protonix Lines: N/A Foley: N/A Code status: full code Updated her husband at bedside on:7/10  Labs       Latest Ref Rng & Units 06/30/2023    2:27 AM 07/07/2023    1:45 AM 07/06/2023    4:29 AM  CMP  Glucose 70 - 99 mg/dL 784  696  295   BUN 8 - 23 mg/dL 27  15  8    Creatinine 0.44 - 1.00 mg/dL 2.84  1.32  4.40   Sodium 135 - 145 mmol/L 135  135  133   Potassium 3.5 - 5.1 mmol/L 2.5  4.0  4.1   Chloride  98 - 111 mmol/L 101  99  102   CO2 22 - 32 mmol/L 26  25  25    Calcium 8.9 - 10.3 mg/dL 8.1  8.6  8.4       Latest Ref Rng & Units 07/26/2023    2:27 AM 07/07/2023    1:45 AM 07/06/2023    4:29 AM  CBC  WBC 4.0 - 10.5 K/uL 6.8  5.0  4.6   Hemoglobin 12.0 - 15.0 g/dL 8.0  8.8  8.4   Hematocrit 36.0 - 46.0 % 23.5  26.5  24.9    Platelets 150 - 400 K/uL 217  130  105     ABG    Component Value Date/Time   PHART 7.416 07/05/2023 0531   PCO2ART 38.3 07/05/2023 0531   PO2ART 102 07/05/2023 0531   HCO3 24.8 07/05/2023 0531   TCO2 26 07/05/2023 0531   O2SAT 98 07/05/2023 0531    CBG (last 3)  Recent Labs    07/07/23 2008 07/05/2023 0012 07/13/2023 0408  GLUCAP 96 119* 105*   The patient is critically ill due to septic shock/acute respiratory failure critical care was necessary to treat or prevent imminent or life-threatening deterioration.  Critical care was time spent personally by me on the following activities: development of treatment plan with patient and/or surrogate as well as nursing, discussions with consultants, evaluation of patient's response to treatment, examination of patient, obtaining history from patient or surrogate, ordering and performing treatments and interventions, ordering and review of laboratory studies, ordering and review of radiographic studies, pulse oximetry, re-evaluation of patient's condition and participation in multidisciplinary rounds.   During this encounter critical care time was devoted to patient care services described in this note for 32 minutes.     Cheri Fowler, MD [facility] See Amion for pager If no response to pager, please call [phone] until 7pm After 7pm, Please call E-link [phone]

## 2023-07-29 NOTE — Progress Notes (Signed)
Patient expired at 1630. Merrily Pew, MD notified. Family at bedside. No belongings. Patient transported to morgue at [facility].

## 2023-07-29 NOTE — Progress Notes (Signed)
Pt extubated to comfort care measures.  

## 2023-07-29 DEATH — deceased
